# Patient Record
Sex: Female | Born: 1980 | Hispanic: No | Marital: Married | Smoking: Current every day smoker
Health system: Southern US, Community
[De-identification: ages and names within clinical notes are randomized; demographics above are authoritative.]

## PROBLEM LIST (undated history)

## (undated) DIAGNOSIS — T4145XA Adverse effect of unspecified anesthetic, initial encounter: Secondary | ICD-10-CM

## (undated) DIAGNOSIS — T8859XA Other complications of anesthesia, initial encounter: Secondary | ICD-10-CM

## (undated) DIAGNOSIS — K219 Gastro-esophageal reflux disease without esophagitis: Secondary | ICD-10-CM

## (undated) DIAGNOSIS — M329 Systemic lupus erythematosus, unspecified: Secondary | ICD-10-CM

## (undated) DIAGNOSIS — M069 Rheumatoid arthritis, unspecified: Secondary | ICD-10-CM

## (undated) DIAGNOSIS — I471 Supraventricular tachycardia, unspecified: Secondary | ICD-10-CM

## (undated) DIAGNOSIS — R0602 Shortness of breath: Secondary | ICD-10-CM

## (undated) DIAGNOSIS — Z8489 Family history of other specified conditions: Secondary | ICD-10-CM

## (undated) DIAGNOSIS — D649 Anemia, unspecified: Secondary | ICD-10-CM

## (undated) DIAGNOSIS — A749 Chlamydial infection, unspecified: Secondary | ICD-10-CM

## (undated) DIAGNOSIS — G4730 Sleep apnea, unspecified: Secondary | ICD-10-CM

## (undated) DIAGNOSIS — I639 Cerebral infarction, unspecified: Secondary | ICD-10-CM

## (undated) DIAGNOSIS — I10 Essential (primary) hypertension: Secondary | ICD-10-CM

## (undated) DIAGNOSIS — A549 Gonococcal infection, unspecified: Secondary | ICD-10-CM

## (undated) DIAGNOSIS — O139 Gestational [pregnancy-induced] hypertension without significant proteinuria, unspecified trimester: Secondary | ICD-10-CM

## (undated) HISTORY — DX: Morbid (severe) obesity due to excess calories: E66.01

---
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
## 2005-11-16 ENCOUNTER — Inpatient Hospital Stay (HOSPITAL_COMMUNITY): Admission: AD | Admit: 2005-11-16 | Discharge: 2005-11-17 | Payer: Self-pay | Admitting: Obstetrics & Gynecology

## 2005-11-25 ENCOUNTER — Inpatient Hospital Stay (HOSPITAL_COMMUNITY): Admission: AD | Admit: 2005-11-25 | Discharge: 2005-11-25 | Payer: Self-pay | Admitting: Gynecology

## 2005-11-29 ENCOUNTER — Ambulatory Visit: Payer: Self-pay | Admitting: Family Medicine

## 2005-11-29 ENCOUNTER — Encounter: Payer: Self-pay | Admitting: Family Medicine

## 2005-12-20 ENCOUNTER — Ambulatory Visit: Payer: Self-pay | Admitting: Family Medicine

## 2006-01-06 ENCOUNTER — Inpatient Hospital Stay (HOSPITAL_COMMUNITY): Admission: AD | Admit: 2006-01-06 | Discharge: 2006-01-06 | Payer: Self-pay | Admitting: Obstetrics and Gynecology

## 2006-04-23 ENCOUNTER — Inpatient Hospital Stay (HOSPITAL_COMMUNITY): Admission: AD | Admit: 2006-04-23 | Discharge: 2006-04-24 | Payer: Self-pay | Admitting: Obstetrics and Gynecology

## 2006-05-23 ENCOUNTER — Emergency Department (HOSPITAL_COMMUNITY): Admission: EM | Admit: 2006-05-23 | Discharge: 2006-05-23 | Payer: Self-pay | Admitting: Emergency Medicine

## 2006-06-17 ENCOUNTER — Encounter: Admission: RE | Admit: 2006-06-17 | Discharge: 2006-06-17 | Payer: Self-pay | Admitting: Family Medicine

## 2006-07-15 ENCOUNTER — Emergency Department (HOSPITAL_COMMUNITY): Admission: EM | Admit: 2006-07-15 | Discharge: 2006-07-16 | Payer: Self-pay | Admitting: Emergency Medicine

## 2006-10-12 ENCOUNTER — Emergency Department (HOSPITAL_COMMUNITY): Admission: EM | Admit: 2006-10-12 | Discharge: 2006-10-12 | Payer: Self-pay | Admitting: Emergency Medicine

## 2006-10-16 ENCOUNTER — Inpatient Hospital Stay (HOSPITAL_COMMUNITY): Admission: AD | Admit: 2006-10-16 | Discharge: 2006-10-16 | Payer: Self-pay | Admitting: Gynecology

## 2006-12-31 ENCOUNTER — Emergency Department (HOSPITAL_COMMUNITY): Admission: EM | Admit: 2006-12-31 | Discharge: 2007-01-01 | Payer: Self-pay | Admitting: Emergency Medicine

## 2007-01-16 ENCOUNTER — Ambulatory Visit: Payer: Self-pay | Admitting: Gynecology

## 2007-02-20 ENCOUNTER — Inpatient Hospital Stay (HOSPITAL_COMMUNITY): Admission: AD | Admit: 2007-02-20 | Discharge: 2007-02-20 | Payer: Self-pay | Admitting: Gynecology

## 2007-02-22 ENCOUNTER — Inpatient Hospital Stay (HOSPITAL_COMMUNITY): Admission: AD | Admit: 2007-02-22 | Discharge: 2007-02-23 | Payer: Self-pay | Admitting: Gynecology

## 2007-02-26 ENCOUNTER — Inpatient Hospital Stay (HOSPITAL_COMMUNITY): Admission: AD | Admit: 2007-02-26 | Discharge: 2007-02-26 | Payer: Self-pay | Admitting: Family Medicine

## 2007-03-03 ENCOUNTER — Inpatient Hospital Stay (HOSPITAL_COMMUNITY): Admission: RE | Admit: 2007-03-03 | Discharge: 2007-03-03 | Payer: Self-pay | Admitting: Gynecology

## 2007-03-10 ENCOUNTER — Inpatient Hospital Stay (HOSPITAL_COMMUNITY): Admission: AD | Admit: 2007-03-10 | Discharge: 2007-03-10 | Payer: Self-pay | Admitting: Obstetrics and Gynecology

## 2007-04-07 ENCOUNTER — Inpatient Hospital Stay (HOSPITAL_COMMUNITY): Admission: AD | Admit: 2007-04-07 | Discharge: 2007-04-08 | Payer: Self-pay | Admitting: Obstetrics & Gynecology

## 2007-04-07 ENCOUNTER — Inpatient Hospital Stay (HOSPITAL_COMMUNITY): Admission: AD | Admit: 2007-04-07 | Discharge: 2007-04-07 | Payer: Self-pay | Admitting: Obstetrics & Gynecology

## 2007-04-17 ENCOUNTER — Inpatient Hospital Stay (HOSPITAL_COMMUNITY): Admission: AD | Admit: 2007-04-17 | Discharge: 2007-04-17 | Payer: Self-pay | Admitting: Obstetrics & Gynecology

## 2007-04-26 ENCOUNTER — Inpatient Hospital Stay (HOSPITAL_COMMUNITY): Admission: AD | Admit: 2007-04-26 | Discharge: 2007-04-26 | Payer: Self-pay | Admitting: Family Medicine

## 2007-04-30 ENCOUNTER — Ambulatory Visit: Payer: Self-pay | Admitting: *Deleted

## 2007-04-30 ENCOUNTER — Ambulatory Visit (HOSPITAL_COMMUNITY): Admission: RE | Admit: 2007-04-30 | Discharge: 2007-04-30 | Payer: Self-pay | Admitting: *Deleted

## 2007-05-01 ENCOUNTER — Ambulatory Visit: Payer: Self-pay | Admitting: Obstetrics and Gynecology

## 2007-05-16 ENCOUNTER — Encounter: Payer: Self-pay | Admitting: *Deleted

## 2007-05-16 ENCOUNTER — Inpatient Hospital Stay (HOSPITAL_COMMUNITY): Admission: AD | Admit: 2007-05-16 | Discharge: 2007-05-16 | Payer: Self-pay | Admitting: Obstetrics and Gynecology

## 2007-06-02 ENCOUNTER — Encounter: Payer: Self-pay | Admitting: Obstetrics & Gynecology

## 2007-06-02 ENCOUNTER — Ambulatory Visit: Payer: Self-pay | Admitting: Obstetrics & Gynecology

## 2007-06-02 ENCOUNTER — Encounter: Admission: RE | Admit: 2007-06-02 | Discharge: 2007-06-02 | Payer: Self-pay | Admitting: Obstetrics & Gynecology

## 2007-06-02 ENCOUNTER — Inpatient Hospital Stay (HOSPITAL_COMMUNITY): Admission: AD | Admit: 2007-06-02 | Discharge: 2007-06-02 | Payer: Self-pay | Admitting: Obstetrics & Gynecology

## 2007-06-04 ENCOUNTER — Encounter: Payer: Self-pay | Admitting: *Deleted

## 2007-06-04 ENCOUNTER — Inpatient Hospital Stay (HOSPITAL_COMMUNITY): Admission: AD | Admit: 2007-06-04 | Discharge: 2007-06-04 | Payer: Self-pay | Admitting: Obstetrics & Gynecology

## 2007-06-20 ENCOUNTER — Inpatient Hospital Stay (HOSPITAL_COMMUNITY): Admission: AD | Admit: 2007-06-20 | Discharge: 2007-06-21 | Payer: Self-pay | Admitting: Obstetrics & Gynecology

## 2007-06-20 ENCOUNTER — Ambulatory Visit: Payer: Self-pay | Admitting: Obstetrics and Gynecology

## 2007-06-23 ENCOUNTER — Ambulatory Visit: Payer: Self-pay | Admitting: Obstetrics & Gynecology

## 2007-06-30 ENCOUNTER — Ambulatory Visit: Payer: Self-pay | Admitting: Obstetrics & Gynecology

## 2007-07-01 ENCOUNTER — Encounter: Admission: RE | Admit: 2007-07-01 | Discharge: 2007-07-01 | Payer: Self-pay | Admitting: Obstetrics & Gynecology

## 2007-07-02 ENCOUNTER — Encounter: Payer: Self-pay | Admitting: *Deleted

## 2007-07-02 ENCOUNTER — Ambulatory Visit: Payer: Self-pay | Admitting: *Deleted

## 2007-07-02 ENCOUNTER — Inpatient Hospital Stay (HOSPITAL_COMMUNITY): Admission: AD | Admit: 2007-07-02 | Discharge: 2007-07-04 | Payer: Self-pay | Admitting: Gynecology

## 2007-07-07 ENCOUNTER — Ambulatory Visit: Payer: Self-pay | Admitting: Obstetrics & Gynecology

## 2007-07-21 ENCOUNTER — Ambulatory Visit: Payer: Self-pay | Admitting: Physician Assistant

## 2007-07-21 ENCOUNTER — Inpatient Hospital Stay (HOSPITAL_COMMUNITY): Admission: AD | Admit: 2007-07-21 | Discharge: 2007-07-22 | Payer: Self-pay | Admitting: Obstetrics & Gynecology

## 2007-07-29 ENCOUNTER — Encounter: Payer: Self-pay | Admitting: *Deleted

## 2007-07-29 ENCOUNTER — Ambulatory Visit: Payer: Self-pay | Admitting: *Deleted

## 2007-07-29 ENCOUNTER — Inpatient Hospital Stay (HOSPITAL_COMMUNITY): Admission: AD | Admit: 2007-07-29 | Discharge: 2007-07-29 | Payer: Self-pay | Admitting: Obstetrics & Gynecology

## 2007-07-30 ENCOUNTER — Inpatient Hospital Stay (HOSPITAL_COMMUNITY): Admission: AD | Admit: 2007-07-30 | Discharge: 2007-07-30 | Payer: Self-pay | Admitting: Obstetrics & Gynecology

## 2007-08-01 ENCOUNTER — Inpatient Hospital Stay (HOSPITAL_COMMUNITY): Admission: AD | Admit: 2007-08-01 | Discharge: 2007-08-01 | Payer: Self-pay | Admitting: Obstetrics & Gynecology

## 2007-08-10 ENCOUNTER — Inpatient Hospital Stay (HOSPITAL_COMMUNITY): Admission: AD | Admit: 2007-08-10 | Discharge: 2007-08-10 | Payer: Self-pay | Admitting: Obstetrics and Gynecology

## 2007-08-14 ENCOUNTER — Ambulatory Visit (HOSPITAL_COMMUNITY): Admission: RE | Admit: 2007-08-14 | Discharge: 2007-08-14 | Payer: Self-pay | Admitting: Obstetrics & Gynecology

## 2007-08-14 ENCOUNTER — Ambulatory Visit: Payer: Self-pay | Admitting: Obstetrics & Gynecology

## 2007-08-15 ENCOUNTER — Ambulatory Visit: Payer: Self-pay | Admitting: Gynecology

## 2007-08-18 ENCOUNTER — Ambulatory Visit: Payer: Self-pay | Admitting: Obstetrics & Gynecology

## 2007-08-25 ENCOUNTER — Ambulatory Visit: Payer: Self-pay | Admitting: Obstetrics & Gynecology

## 2007-08-27 ENCOUNTER — Ambulatory Visit: Payer: Self-pay | Admitting: Obstetrics and Gynecology

## 2007-08-28 HISTORY — PX: TUBAL LIGATION: SHX77

## 2007-08-30 ENCOUNTER — Inpatient Hospital Stay (HOSPITAL_COMMUNITY): Admission: AD | Admit: 2007-08-30 | Discharge: 2007-08-31 | Payer: Self-pay | Admitting: Gynecology

## 2007-08-30 ENCOUNTER — Ambulatory Visit: Payer: Self-pay | Admitting: *Deleted

## 2007-09-01 ENCOUNTER — Ambulatory Visit: Payer: Self-pay | Admitting: Obstetrics & Gynecology

## 2007-09-06 ENCOUNTER — Inpatient Hospital Stay (HOSPITAL_COMMUNITY): Admission: AD | Admit: 2007-09-06 | Discharge: 2007-09-06 | Payer: Self-pay | Admitting: Obstetrics & Gynecology

## 2007-09-08 ENCOUNTER — Ambulatory Visit: Payer: Self-pay | Admitting: Family Medicine

## 2007-09-11 ENCOUNTER — Inpatient Hospital Stay (HOSPITAL_COMMUNITY): Admission: AD | Admit: 2007-09-11 | Discharge: 2007-09-13 | Payer: Self-pay | Admitting: Obstetrics & Gynecology

## 2007-09-11 ENCOUNTER — Encounter: Payer: Self-pay | Admitting: *Deleted

## 2007-09-11 ENCOUNTER — Ambulatory Visit: Payer: Self-pay | Admitting: *Deleted

## 2007-09-11 ENCOUNTER — Ambulatory Visit: Payer: Self-pay | Admitting: Obstetrics & Gynecology

## 2007-09-15 ENCOUNTER — Ambulatory Visit: Payer: Self-pay | Admitting: Family Medicine

## 2007-09-19 ENCOUNTER — Ambulatory Visit (HOSPITAL_COMMUNITY): Admission: RE | Admit: 2007-09-19 | Discharge: 2007-09-19 | Payer: Self-pay | Admitting: Family Medicine

## 2007-09-20 ENCOUNTER — Inpatient Hospital Stay (HOSPITAL_COMMUNITY): Admission: AD | Admit: 2007-09-20 | Discharge: 2007-09-20 | Payer: Self-pay | Admitting: Obstetrics and Gynecology

## 2007-09-22 ENCOUNTER — Ambulatory Visit: Payer: Self-pay | Admitting: Family Medicine

## 2007-09-22 ENCOUNTER — Ambulatory Visit (HOSPITAL_COMMUNITY): Admission: RE | Admit: 2007-09-22 | Discharge: 2007-09-22 | Payer: Self-pay | Admitting: Obstetrics & Gynecology

## 2007-09-25 ENCOUNTER — Inpatient Hospital Stay (HOSPITAL_COMMUNITY): Admission: AD | Admit: 2007-09-25 | Discharge: 2007-09-26 | Payer: Self-pay | Admitting: Obstetrics & Gynecology

## 2007-09-25 ENCOUNTER — Ambulatory Visit: Payer: Self-pay | Admitting: *Deleted

## 2007-09-26 ENCOUNTER — Ambulatory Visit: Payer: Self-pay | Admitting: Gynecology

## 2007-09-26 ENCOUNTER — Inpatient Hospital Stay (HOSPITAL_COMMUNITY): Admission: AD | Admit: 2007-09-26 | Discharge: 2007-09-28 | Payer: Self-pay | Admitting: Gynecology

## 2007-09-29 ENCOUNTER — Ambulatory Visit: Payer: Self-pay | Admitting: Obstetrics and Gynecology

## 2007-09-29 ENCOUNTER — Ambulatory Visit: Payer: Self-pay | Admitting: Obstetrics & Gynecology

## 2007-09-29 ENCOUNTER — Inpatient Hospital Stay (HOSPITAL_COMMUNITY): Admission: AD | Admit: 2007-09-29 | Discharge: 2007-09-29 | Payer: Self-pay | Admitting: Obstetrics & Gynecology

## 2007-10-03 ENCOUNTER — Encounter: Payer: Self-pay | Admitting: *Deleted

## 2007-10-03 ENCOUNTER — Ambulatory Visit: Payer: Self-pay | Admitting: Physician Assistant

## 2007-10-03 ENCOUNTER — Observation Stay (HOSPITAL_COMMUNITY): Admission: AD | Admit: 2007-10-03 | Discharge: 2007-10-03 | Payer: Self-pay | Admitting: Obstetrics & Gynecology

## 2007-10-04 ENCOUNTER — Inpatient Hospital Stay (HOSPITAL_COMMUNITY): Admission: AD | Admit: 2007-10-04 | Discharge: 2007-10-07 | Payer: Self-pay | Admitting: Obstetrics & Gynecology

## 2007-10-04 ENCOUNTER — Ambulatory Visit: Payer: Self-pay | Admitting: Family

## 2007-10-05 ENCOUNTER — Encounter: Payer: Self-pay | Admitting: Obstetrics & Gynecology

## 2007-10-12 ENCOUNTER — Emergency Department (HOSPITAL_COMMUNITY): Admission: EM | Admit: 2007-10-12 | Discharge: 2007-10-12 | Payer: Self-pay | Admitting: Emergency Medicine

## 2007-11-20 ENCOUNTER — Ambulatory Visit: Payer: Self-pay | Admitting: Obstetrics & Gynecology

## 2008-01-31 ENCOUNTER — Inpatient Hospital Stay (HOSPITAL_COMMUNITY): Admission: AD | Admit: 2008-01-31 | Discharge: 2008-01-31 | Payer: Self-pay | Admitting: Obstetrics and Gynecology

## 2008-05-16 ENCOUNTER — Emergency Department (HOSPITAL_COMMUNITY): Admission: EM | Admit: 2008-05-16 | Discharge: 2008-05-16 | Payer: Self-pay | Admitting: Emergency Medicine

## 2008-08-27 DIAGNOSIS — A549 Gonococcal infection, unspecified: Secondary | ICD-10-CM

## 2008-08-27 HISTORY — DX: Gonococcal infection, unspecified: A54.9

## 2008-11-30 ENCOUNTER — Inpatient Hospital Stay (HOSPITAL_COMMUNITY): Admission: AD | Admit: 2008-11-30 | Discharge: 2008-11-30 | Payer: Self-pay | Admitting: Obstetrics & Gynecology

## 2008-11-30 ENCOUNTER — Ambulatory Visit: Payer: Self-pay | Admitting: Obstetrics and Gynecology

## 2009-02-19 ENCOUNTER — Emergency Department (HOSPITAL_COMMUNITY): Admission: EM | Admit: 2009-02-19 | Discharge: 2009-02-20 | Payer: Self-pay | Admitting: Emergency Medicine

## 2009-03-10 ENCOUNTER — Emergency Department (HOSPITAL_COMMUNITY): Admission: EM | Admit: 2009-03-10 | Discharge: 2009-03-10 | Payer: Self-pay | Admitting: Emergency Medicine

## 2009-03-21 ENCOUNTER — Inpatient Hospital Stay (HOSPITAL_COMMUNITY): Admission: AD | Admit: 2009-03-21 | Discharge: 2009-03-21 | Payer: Self-pay | Admitting: Obstetrics & Gynecology

## 2009-04-17 ENCOUNTER — Emergency Department (HOSPITAL_COMMUNITY): Admission: EM | Admit: 2009-04-17 | Discharge: 2009-04-17 | Payer: Self-pay | Admitting: Emergency Medicine

## 2009-05-15 ENCOUNTER — Emergency Department (HOSPITAL_COMMUNITY): Admission: EM | Admit: 2009-05-15 | Discharge: 2009-05-16 | Payer: Self-pay | Admitting: Emergency Medicine

## 2009-08-04 ENCOUNTER — Emergency Department (HOSPITAL_COMMUNITY): Admission: EM | Admit: 2009-08-04 | Discharge: 2009-08-05 | Payer: Self-pay | Admitting: Emergency Medicine

## 2009-11-06 IMAGING — US US FETAL BPP W/O NONSTRESS
1 series · 14 of 28 positions shown · non-contrast
Comparison: none

OBSTETRICAL ULTRASOUND:
 This ultrasound was performed in The [HOSPITAL], and the [facility] report will be stored to [REDACTED] PACS.

[Series 1: us fetal bpp w/o nonstress · 14 of 31 slices shown]
[im 2/31]
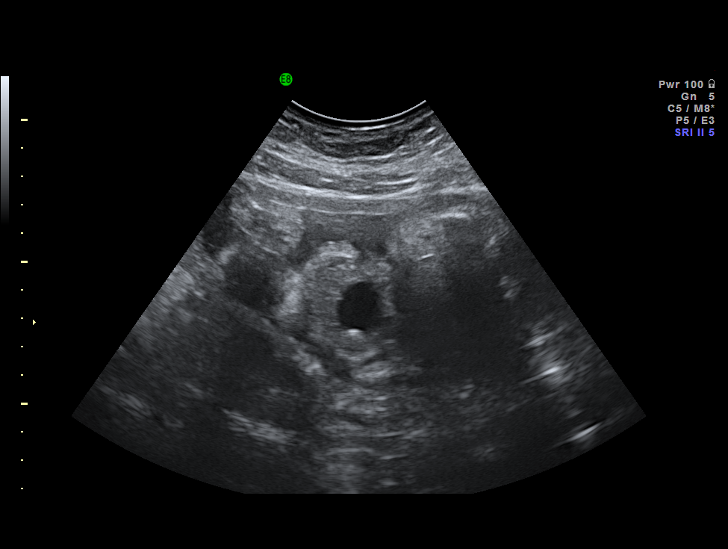
[im 4/31]
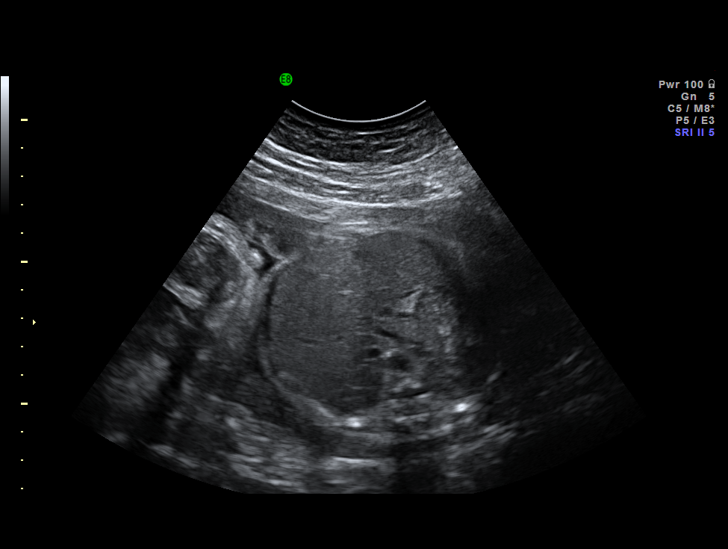
[im 6/31]
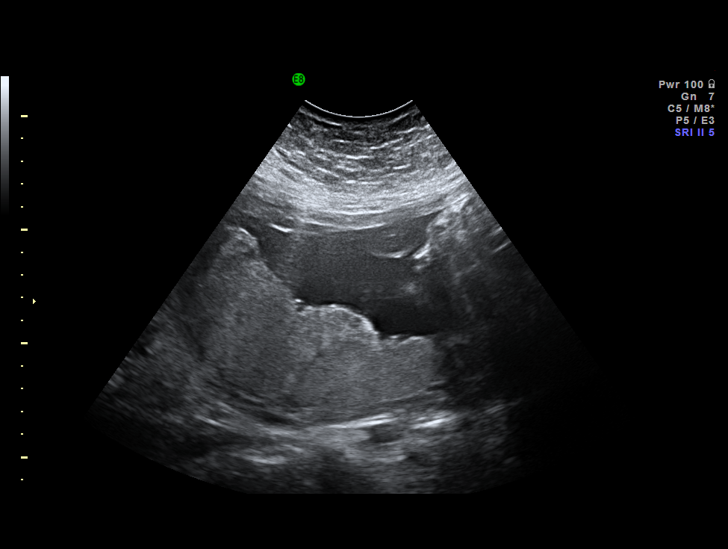
[im 8/31]
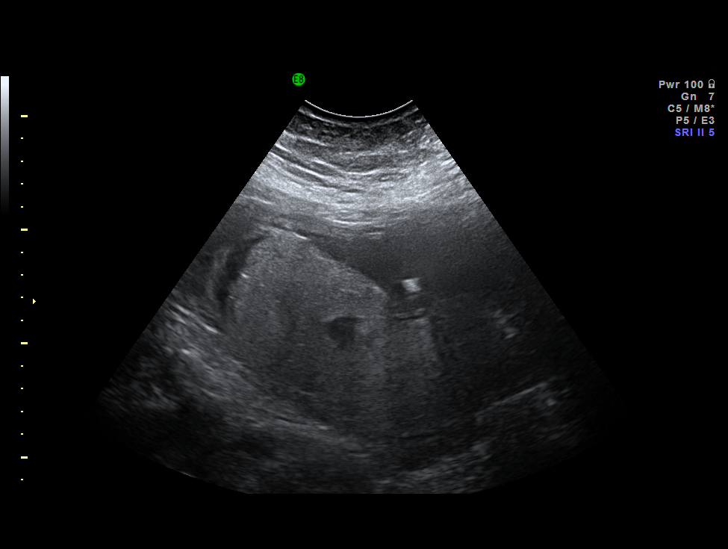
[im 11/31]
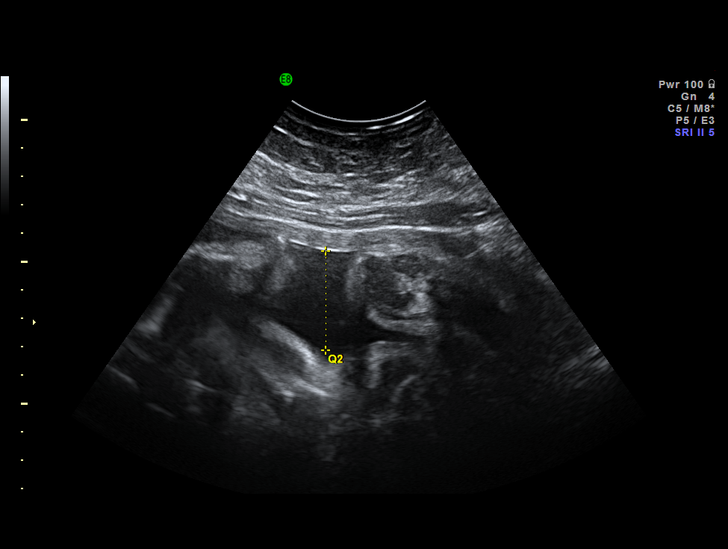
[im 13/31]
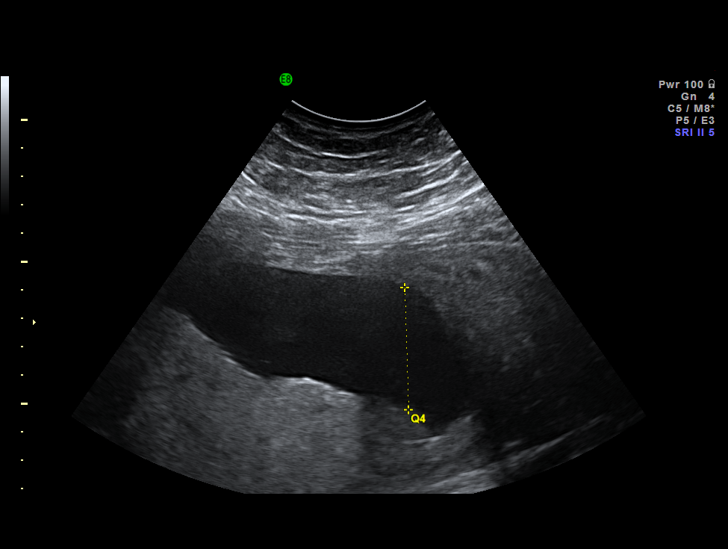
[im 15/31]
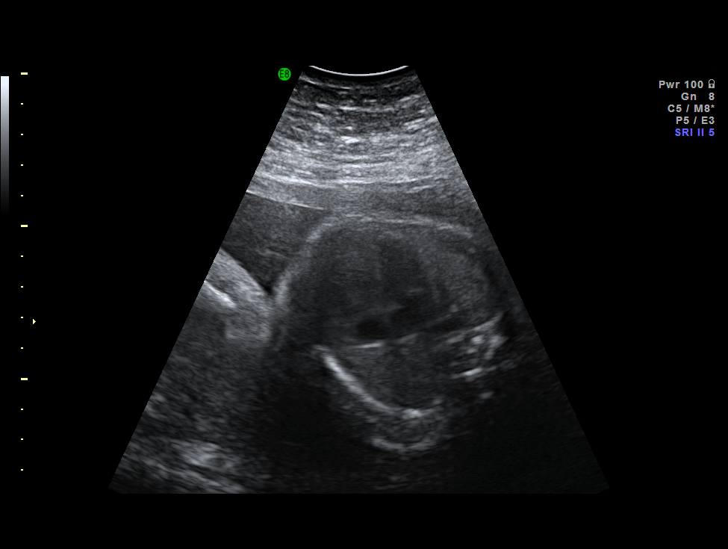
[im 17/31]
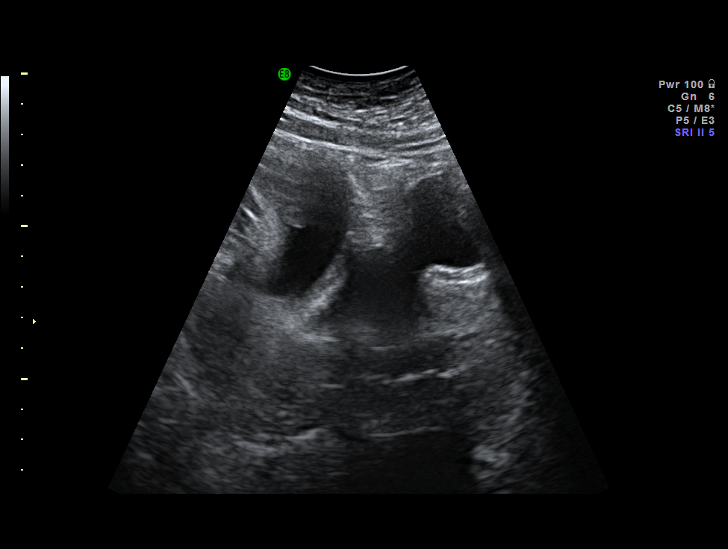
[im 19/31]
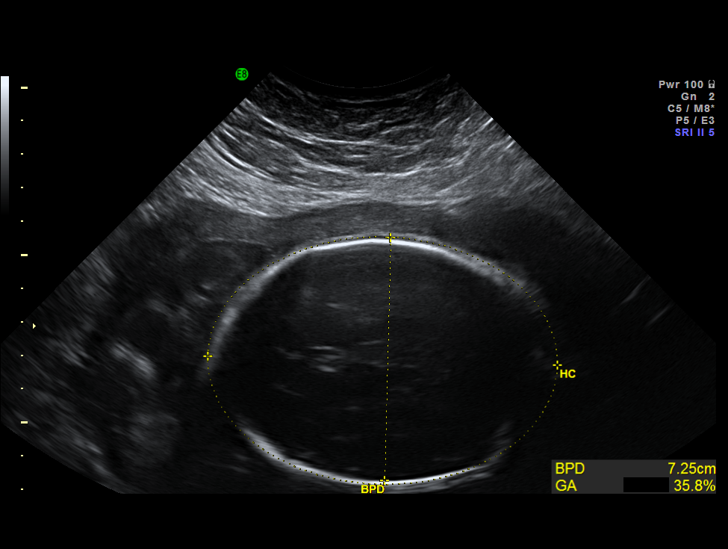
[im 22/31]
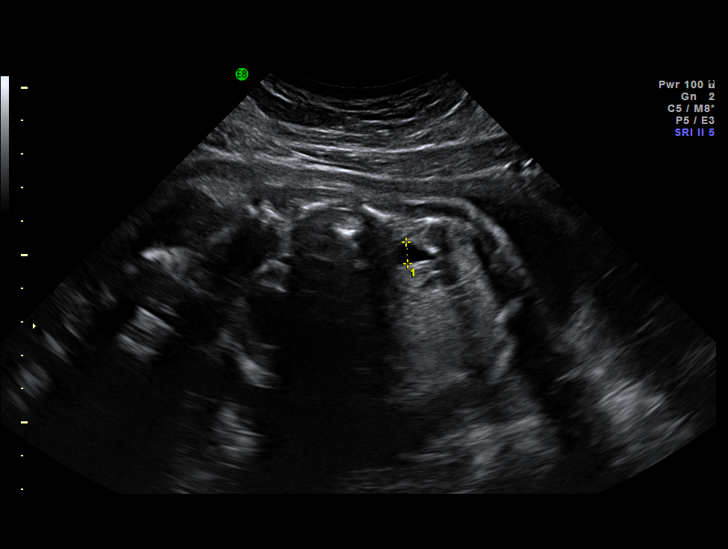
[im 24/31]
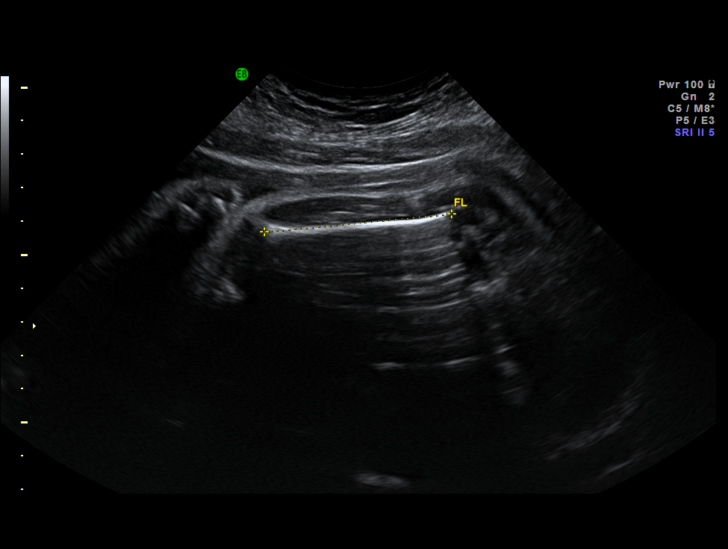
[im 26/31]
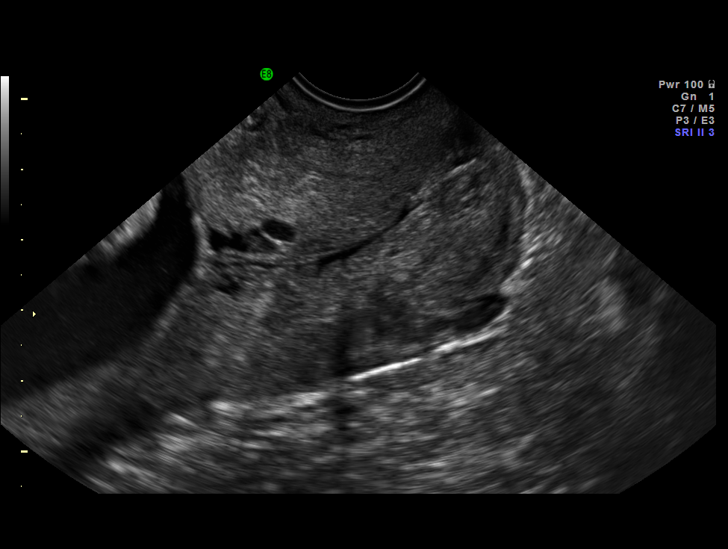
[im 28/31]
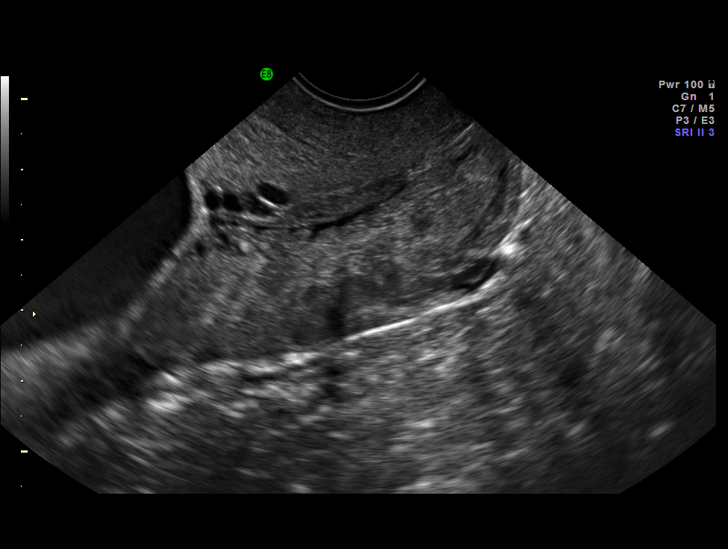
[im 31/31]
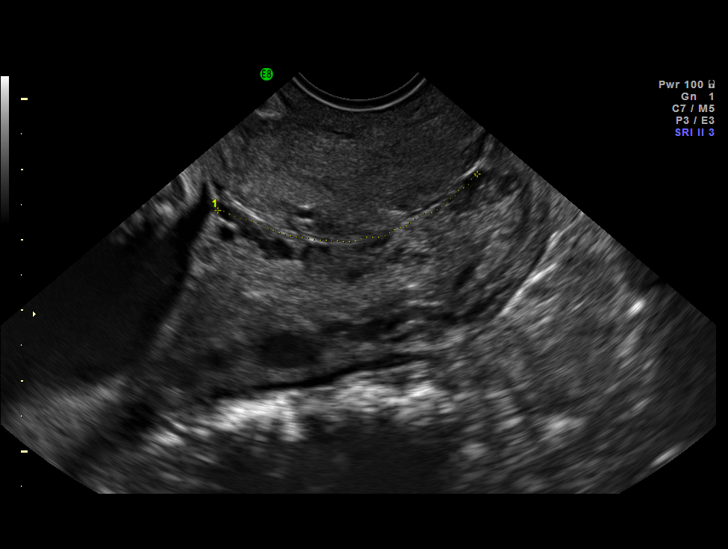

[14 of 28 positions shown; findings below may reference images not displayed]

IMPRESSION: The [facility] report has also been faxed to the ordering physician.

## 2010-01-31 ENCOUNTER — Emergency Department (HOSPITAL_COMMUNITY): Admission: EM | Admit: 2010-01-31 | Discharge: 2010-01-31 | Payer: Self-pay | Admitting: Family Medicine

## 2010-02-10 ENCOUNTER — Emergency Department (HOSPITAL_COMMUNITY): Admission: EM | Admit: 2010-02-10 | Discharge: 2010-02-10 | Payer: Self-pay | Admitting: Emergency Medicine

## 2010-02-25 ENCOUNTER — Ambulatory Visit: Payer: Self-pay | Admitting: Nurse Practitioner

## 2010-02-25 ENCOUNTER — Inpatient Hospital Stay (HOSPITAL_COMMUNITY): Admission: AD | Admit: 2010-02-25 | Discharge: 2010-02-26 | Payer: Self-pay | Admitting: Obstetrics & Gynecology

## 2010-06-30 ENCOUNTER — Inpatient Hospital Stay (HOSPITAL_COMMUNITY): Admission: AD | Admit: 2010-06-30 | Discharge: 2010-06-30 | Payer: Self-pay | Admitting: Family Medicine

## 2010-07-10 ENCOUNTER — Emergency Department (HOSPITAL_COMMUNITY): Admission: EM | Admit: 2010-07-10 | Discharge: 2010-07-11 | Payer: Self-pay | Admitting: Emergency Medicine

## 2010-08-27 DIAGNOSIS — A749 Chlamydial infection, unspecified: Secondary | ICD-10-CM

## 2010-08-27 DIAGNOSIS — I639 Cerebral infarction, unspecified: Secondary | ICD-10-CM | POA: Insufficient documentation

## 2010-08-27 DIAGNOSIS — Z8673 Personal history of transient ischemic attack (TIA), and cerebral infarction without residual deficits: Secondary | ICD-10-CM | POA: Insufficient documentation

## 2010-08-27 HISTORY — DX: Cerebral infarction, unspecified: I63.9

## 2010-08-27 HISTORY — DX: Chlamydial infection, unspecified: A74.9

## 2010-09-17 ENCOUNTER — Encounter: Payer: Self-pay | Admitting: Gynecology

## 2010-09-17 ENCOUNTER — Encounter: Payer: Self-pay | Admitting: *Deleted

## 2010-10-30 ENCOUNTER — Inpatient Hospital Stay (HOSPITAL_COMMUNITY)
Admission: EM | Admit: 2010-10-30 | Discharge: 2010-11-01 | DRG: 069 | Disposition: A | Discharge status: Home Health | Payer: Self-pay | Attending: Internal Medicine | Admitting: Internal Medicine

## 2010-10-30 ENCOUNTER — Emergency Department (HOSPITAL_COMMUNITY): Payer: Self-pay

## 2010-10-30 ENCOUNTER — Ambulatory Visit: Payer: Self-pay | Admitting: Family Medicine

## 2010-10-30 DIAGNOSIS — E282 Polycystic ovarian syndrome: Secondary | ICD-10-CM | POA: Diagnosis present

## 2010-10-30 DIAGNOSIS — Z86718 Personal history of other venous thrombosis and embolism: Secondary | ICD-10-CM

## 2010-10-30 DIAGNOSIS — J45909 Unspecified asthma, uncomplicated: Secondary | ICD-10-CM | POA: Diagnosis present

## 2010-10-30 DIAGNOSIS — Z7982 Long term (current) use of aspirin: Secondary | ICD-10-CM

## 2010-10-30 DIAGNOSIS — Z833 Family history of diabetes mellitus: Secondary | ICD-10-CM

## 2010-10-30 DIAGNOSIS — K219 Gastro-esophageal reflux disease without esophagitis: Secondary | ICD-10-CM | POA: Diagnosis present

## 2010-10-30 DIAGNOSIS — I10 Essential (primary) hypertension: Secondary | ICD-10-CM | POA: Diagnosis present

## 2010-10-30 DIAGNOSIS — R0789 Other chest pain: Secondary | ICD-10-CM | POA: Diagnosis present

## 2010-10-30 DIAGNOSIS — G43809 Other migraine, not intractable, without status migrainosus: Secondary | ICD-10-CM | POA: Diagnosis present

## 2010-10-30 DIAGNOSIS — Z8249 Family history of ischemic heart disease and other diseases of the circulatory system: Secondary | ICD-10-CM

## 2010-10-30 DIAGNOSIS — K8020 Calculus of gallbladder without cholecystitis without obstruction: Secondary | ICD-10-CM | POA: Diagnosis present

## 2010-10-30 DIAGNOSIS — G459 Transient cerebral ischemic attack, unspecified: Principal | ICD-10-CM | POA: Diagnosis present

## 2010-10-30 DIAGNOSIS — R7309 Other abnormal glucose: Secondary | ICD-10-CM | POA: Diagnosis present

## 2010-10-30 LAB — POCT I-STAT, CHEM 8
BUN: 17 mg/dL (ref 6–23)
Calcium, Ion: 1.14 mmol/L (ref 1.12–1.32)
Chloride: 101 mEq/L (ref 96–112)
Creatinine, Ser: 0.9 mg/dL (ref 0.4–1.2)
Glucose, Bld: 97 mg/dL (ref 70–99)
HCT: 43 % (ref 36.0–46.0)
Hemoglobin: 14.6 g/dL (ref 12.0–15.0)
Potassium: 4.1 mEq/L (ref 3.5–5.1)
Sodium: 137 mEq/L (ref 135–145)
TCO2: 26 mmol/L (ref 0–100)

## 2010-10-30 LAB — URINE MICROSCOPIC-ADD ON

## 2010-10-30 LAB — CBC
HCT: 39.7 % (ref 36.0–46.0)
Hemoglobin: 13.3 g/dL (ref 12.0–15.0)
MCH: 28.6 pg (ref 26.0–34.0)
MCHC: 33.5 g/dL (ref 30.0–36.0)
MCV: 85.4 fL (ref 78.0–100.0)
Platelets: 244 10*3/uL (ref 150–400)
RBC: 4.65 MIL/uL (ref 3.87–5.11)
RDW: 13.7 % (ref 11.5–15.5)
WBC: 10.2 10*3/uL (ref 4.0–10.5)

## 2010-10-30 LAB — URINALYSIS, ROUTINE W REFLEX MICROSCOPIC
Bilirubin Urine: NEGATIVE
Glucose, UA: NEGATIVE mg/dL
Hgb urine dipstick: NEGATIVE
Ketones, ur: NEGATIVE mg/dL
Leukocytes, UA: NEGATIVE
Nitrite: NEGATIVE
Protein, ur: NEGATIVE mg/dL
Specific Gravity, Urine: 1.027 (ref 1.005–1.030)
Urobilinogen, UA: 1 mg/dL (ref 0.0–1.0)
pH: 7 (ref 5.0–8.0)

## 2010-10-30 LAB — DIFFERENTIAL
Basophils Absolute: 0 10*3/uL (ref 0.0–0.1)
Basophils Relative: 0 % (ref 0–1)
Eosinophils Absolute: 0.2 10*3/uL (ref 0.0–0.7)
Eosinophils Relative: 2 % (ref 0–5)
Lymphocytes Relative: 37 % (ref 12–46)
Lymphs Abs: 3.8 10*3/uL (ref 0.7–4.0)
Monocytes Absolute: 0.8 10*3/uL (ref 0.1–1.0)
Monocytes Relative: 8 % (ref 3–12)
Neutro Abs: 5.5 10*3/uL (ref 1.7–7.7)
Neutrophils Relative %: 54 % (ref 43–77)

## 2010-10-31 ENCOUNTER — Inpatient Hospital Stay (HOSPITAL_COMMUNITY): Payer: Self-pay

## 2010-10-31 DIAGNOSIS — G459 Transient cerebral ischemic attack, unspecified: Secondary | ICD-10-CM

## 2010-10-31 LAB — COMPREHENSIVE METABOLIC PANEL
ALT: 13 U/L (ref 0–35)
AST: 15 U/L (ref 0–37)
Albumin: 3.5 g/dL (ref 3.5–5.2)
Alkaline Phosphatase: 69 U/L (ref 39–117)
BUN: 11 mg/dL (ref 6–23)
CO2: 28 mEq/L (ref 19–32)
Calcium: 9 mg/dL (ref 8.4–10.5)
Chloride: 103 mEq/L (ref 96–112)
Creatinine, Ser: 0.67 mg/dL (ref 0.4–1.2)
GFR calc Af Amer: >60 mL/min (ref >60)
GFR calc non Af Amer: >60 mL/min (ref >60)
Glucose, Bld: 106 mg/dL — ABNORMAL HIGH (ref 70–99)
Potassium: 3.5 mEq/L (ref 3.5–5.1)
Sodium: 137 mEq/L (ref 135–145)
Total Bilirubin: 0.5 mg/dL (ref 0.3–1.2)
Total Protein: 7.6 g/dL (ref 6.0–8.3)

## 2010-10-31 LAB — CBC
HCT: 37.2 % (ref 36.0–46.0)
Hemoglobin: 12.3 g/dL (ref 12.0–15.0)
MCH: 27.9 pg (ref 26.0–34.0)
MCHC: 33.1 g/dL (ref 30.0–36.0)
MCV: 84.4 fL (ref 78.0–100.0)
Platelets: 209 10*3/uL (ref 150–400)
RBC: 4.41 MIL/uL (ref 3.87–5.11)
RDW: 13.7 % (ref 11.5–15.5)
WBC: 8.4 10*3/uL (ref 4.0–10.5)

## 2010-10-31 LAB — LIPID PANEL
Cholesterol: 164 mg/dL (ref 0–200)
HDL: 33 mg/dL — ABNORMAL LOW (ref >39)
LDL Cholesterol: 120 mg/dL — ABNORMAL HIGH (ref 0–99)
Total CHOL/HDL Ratio: 5 RATIO
Triglycerides: 56 mg/dL (ref <150)
VLDL: 11 mg/dL (ref 0–40)

## 2010-10-31 LAB — CARDIAC PANEL(CRET KIN+CKTOT+MB+TROPI)
CK, MB: 1.2 ng/mL (ref 0.3–4.0)
CK, MB: 1 ng/mL (ref 0.3–4.0)
CK, MB: 0.9 ng/mL (ref 0.3–4.0)
Relative Index: INVALID (ref 0.0–2.5)
Relative Index: INVALID (ref 0.0–2.5)
Relative Index: INVALID (ref 0.0–2.5)
Total CK: 81 U/L (ref 7–177)
Total CK: 72 U/L (ref 7–177)
Total CK: 79 U/L (ref 7–177)
Troponin I: <0.01 ng/mL (ref 0.00–0.06)
Troponin I: 0.01 ng/mL (ref 0.00–0.06)
Troponin I: 0.02 ng/mL (ref 0.00–0.06)

## 2010-10-31 LAB — RAPID URINE DRUG SCREEN, HOSP PERFORMED
Amphetamines: NOT DETECTED
Barbiturates: NOT DETECTED
Benzodiazepines: NOT DETECTED
Cocaine: NOT DETECTED
Opiates: NOT DETECTED
Tetrahydrocannabinol: POSITIVE — AB

## 2010-10-31 LAB — HEMOGLOBIN A1C
Hgb A1c MFr Bld: 6 % — ABNORMAL HIGH (ref <5.7)
Mean Plasma Glucose: 126 mg/dL — ABNORMAL HIGH (ref <117)

## 2010-10-31 LAB — GLUCOSE, CAPILLARY: Glucose-Capillary: 114 mg/dL — ABNORMAL HIGH (ref 70–99)

## 2010-10-31 LAB — HOMOCYSTEINE: Homocysteine: 7.6 umol/L (ref 4.0–15.4)

## 2010-10-31 LAB — APTT: aPTT: 34 seconds (ref 24–37)

## 2010-10-31 LAB — ANTITHROMBIN III: AntiThromb III Func: 86 % (ref 75–120)

## 2010-10-31 LAB — PROTIME-INR
INR: 1.07 (ref 0.00–1.49)
Prothrombin Time: 14.1 seconds (ref 11.6–15.2)

## 2010-11-01 ENCOUNTER — Inpatient Hospital Stay (HOSPITAL_COMMUNITY): Payer: Self-pay

## 2010-11-01 LAB — PROTEIN C ACTIVITY: Protein C Activity: 140 % — ABNORMAL HIGH (ref 75–133)

## 2010-11-01 LAB — BASIC METABOLIC PANEL
BUN: 11 mg/dL (ref 6–23)
CO2: 27 mEq/L (ref 19–32)
Calcium: 8.6 mg/dL (ref 8.4–10.5)
Chloride: 99 mEq/L (ref 96–112)
Creatinine, Ser: 0.75 mg/dL (ref 0.4–1.2)
GFR calc Af Amer: >60 mL/min (ref >60)
GFR calc non Af Amer: >60 mL/min (ref >60)
Glucose, Bld: 132 mg/dL — ABNORMAL HIGH (ref 70–99)
Potassium: 4 mEq/L (ref 3.5–5.1)
Sodium: 136 mEq/L (ref 135–145)

## 2010-11-01 LAB — CBC
HCT: 35.9 % — ABNORMAL LOW (ref 36.0–46.0)
Hemoglobin: 11.8 g/dL — ABNORMAL LOW (ref 12.0–15.0)
MCH: 28.2 pg (ref 26.0–34.0)
MCHC: 32.9 g/dL (ref 30.0–36.0)
MCV: 85.9 fL (ref 78.0–100.0)
Platelets: 205 10*3/uL (ref 150–400)
RBC: 4.18 MIL/uL (ref 3.87–5.11)
RDW: 13.7 % (ref 11.5–15.5)
WBC: 6.4 10*3/uL (ref 4.0–10.5)

## 2010-11-01 LAB — LUPUS ANTICOAGULANT PANEL
DRVVT: 47.7 secs — ABNORMAL HIGH (ref 36.2–44.3)
Lupus Anticoagulant: NOT DETECTED
PTT Lupus Anticoagulant: 48.6 secs — ABNORMAL HIGH (ref 30.0–45.6)
PTTLA 4:1 Mix: 47.9 secs — ABNORMAL HIGH (ref 30.0–45.6)
PTTLA Confirmation: 0.1 secs (ref <8.0)
dRVVT Incubated 1:1 Mix: 42.1 secs (ref 36.2–44.3)

## 2010-11-01 LAB — CARDIAC PANEL(CRET KIN+CKTOT+MB+TROPI)
CK, MB: 0.8 ng/mL (ref 0.3–4.0)
Relative Index: INVALID (ref 0.0–2.5)
Total CK: 84 U/L (ref 7–177)
Troponin I: <0.01 ng/mL (ref 0.00–0.06)

## 2010-11-01 LAB — GLUCOSE, CAPILLARY
Glucose-Capillary: 114 mg/dL — ABNORMAL HIGH (ref 70–99)
Glucose-Capillary: 85 mg/dL (ref 70–99)

## 2010-11-01 LAB — PROTEIN S ACTIVITY: Protein S Activity: 66 % — ABNORMAL LOW (ref 69–129)

## 2010-11-01 LAB — D-DIMER, QUANTITATIVE: D-Dimer, Quant: 0.3 ug/mL-FEU (ref 0.00–0.48)

## 2010-11-01 LAB — TSH: TSH: 0.838 u[IU]/mL (ref 0.350–4.500)

## 2010-11-02 LAB — FACTOR 5 LEIDEN

## 2010-11-05 NOTE — Discharge Summary (Signed)
Maria Carpenter, BACKER NO.:  [id number]  MEDICAL RECORD NO.:  [id number]           PATIENT TYPE:  I  LOCATION:  3732                         FACILITY:  [facility]  PHYSICIAN:  Lonia Blood, M.D.       DATE OF BIRTH:  Mar 25, 1981  DATE OF ADMISSION:  10/30/2010 DATE OF DISCHARGE:  11/01/2010                              DISCHARGE SUMMARY   PRIMARY CARE PHYSICIAN:  Will become [facility].  DISCHARGE DIAGNOSES: 1. Probable transient ischemic attack versus migraine with     neurological deficits - neurological deficits have resolved by the     time of discharge. 2. Morbid obesity. 3. Hypertension. 4. Impaired glucose tolerance. 5. Probable polycystic ovarian syndrome. 6. History of asthma. 7. Gastroesophageal reflux disease. 8. Asymptomatic cholelithiasis.  DISCHARGE MEDICATIONS: 1. Propranolol 10 mg twice a day. 2. Aspirin 81 mg daily. 3. Metformin 500 mg daily. 4. Nicotine patch 21 mg daily. 5. Simvastatin 10 mg daily.  CONDITION ON DISCHARGE:  Maria Carpenter was discharged home in good condition.  She was set up with [facility] November 24, 2010.  She was neurologically intact during my exam and the neurologist's exam.  She felt overall though weak, but again during the objective exam, she had no noticeable deficits.  PROCEDURE IN THIS ADMISSION: 1. On October 30, 2010, the patient underwent a head CT without contrast,     which showed no acute intracranial findings, normal exam.  No     change since prior exam. 2. November 01, 2010, repeat head CT, which was without any interval     changes. 3. Carotid Dopplers October 31, 2010, which was negative for extracranial     carotid artery stenosis.  CONSULTATION IN ADMISSION:  The patient was discussed and examined together with consultant neurologist, Dr. Hoy Morn.  HISTORY AND PHYSICAL:  Refer to dictated H and P done by Dr. Houston Siren October 31, 2010.  HOSPITAL COURSE: 1. Transient  neurological deficits.  Maria Carpenter was admitted on     observation on telemetry, frequent neurological examinations.     Differential diagnosis was between TIA or a migraine with     neurological deficits.  The patient had an initial head CT which     was later repeated without any differences between the two of them     noted.  The patient could not have an MRI done due to the fact that     she was exceeding the weight limit of the MRI machine.  Full     examination done by myself and Dr. Hoy Morn on November 01, 2010, did not     reveal any neurological deficits, so we felt that this patient did     not have a stroke.  In the end, we recommended risk factor     modification to the patient including smoking cessation, daily     aspirin, and statin for hyperlipidemia. 2. Migraine headaches, which seemed to be quite frequent in nature.  I     have started the patient on propranolol for prevention purposes. 3. Symptoms consistent with polycystic ovarian syndrome with impaired  glucose tolerance, irregular menses, history of pelvic ultrasounds     with ovarian cyst.  I have placed the patient on a low-dose     metformin and she will follow up with her primary gynecologist. 4. Family history of hypercoagulable syndrome.  The patient had a     hypercoagulable panel sent out, results of which are pending at     time of my dictation. 5. Impaired glucose tolerance with a hemoglobin A1c of 6.  I have     educated the patient about diet, exercise, starting her on low-dose     metformin.     Lonia Blood, M.D.     SL/MEDQ  D:  11/02/2010  T:  11/02/2010  Job:  [id number]  Electronically Signed by Lonia Blood M.D. on 11/05/2010 08:56:34 AM

## 2010-11-06 LAB — PROTEIN C, TOTAL: Protein C, Total: 107 % (ref 70–140)

## 2010-11-06 LAB — PROTEIN S, TOTAL: Protein S Ag, Total: 24 % — ABNORMAL LOW (ref 70–140)

## 2010-11-07 LAB — CBC
HCT: 37.5 % (ref 36.0–46.0)
Hemoglobin: 12.6 g/dL (ref 12.0–15.0)
MCH: 29.7 pg (ref 26.0–34.0)
MCHC: 33.6 g/dL (ref 30.0–36.0)
MCV: 88.6 fL (ref 78.0–100.0)
Platelets: 194 10*3/uL (ref 150–400)
RBC: 4.23 MIL/uL (ref 3.87–5.11)
RDW: 14.5 % (ref 11.5–15.5)
WBC: 7.5 10*3/uL (ref 4.0–10.5)

## 2010-11-07 LAB — DIFFERENTIAL
Basophils Absolute: 0.1 10*3/uL (ref 0.0–0.1)
Basophils Relative: 1 % (ref 0–1)
Eosinophils Absolute: 0.1 10*3/uL (ref 0.0–0.7)
Eosinophils Relative: 2 % (ref 0–5)
Lymphocytes Relative: 35 % (ref 12–46)
Lymphs Abs: 2.7 10*3/uL (ref 0.7–4.0)
Monocytes Absolute: 0.5 10*3/uL (ref 0.1–1.0)
Monocytes Relative: 7 % (ref 3–12)
Neutro Abs: 4.2 10*3/uL (ref 1.7–7.7)
Neutrophils Relative %: 55 % (ref 43–77)

## 2010-11-07 LAB — WET PREP, GENITAL
Trich, Wet Prep: NONE SEEN
Yeast Wet Prep HPF POC: NONE SEEN

## 2010-11-07 LAB — URINALYSIS, ROUTINE W REFLEX MICROSCOPIC
Bilirubin Urine: NEGATIVE
Bilirubin Urine: NEGATIVE
Glucose, UA: NEGATIVE mg/dL
Glucose, UA: NEGATIVE mg/dL
Hgb urine dipstick: NEGATIVE
Hgb urine dipstick: NEGATIVE
Ketones, ur: NEGATIVE mg/dL
Ketones, ur: NEGATIVE mg/dL
Nitrite: NEGATIVE
Nitrite: NEGATIVE
Protein, ur: NEGATIVE mg/dL
Protein, ur: NEGATIVE mg/dL
Specific Gravity, Urine: 1.018 (ref 1.005–1.030)
Specific Gravity, Urine: 1.02 (ref 1.005–1.030)
Urobilinogen, UA: 0.2 mg/dL (ref 0.0–1.0)
Urobilinogen, UA: 0.2 mg/dL (ref 0.0–1.0)
pH: 6 (ref 5.0–8.0)
pH: 6.5 (ref 5.0–8.0)

## 2010-11-07 LAB — GC/CHLAMYDIA PROBE AMP, GENITAL
Chlamydia, DNA Probe: NEGATIVE
GC Probe Amp, Genital: NEGATIVE

## 2010-11-07 LAB — POCT I-STAT, CHEM 8
BUN: 7 mg/dL (ref 6–23)
Calcium, Ion: 1.16 mmol/L (ref 1.12–1.32)
Chloride: 103 mEq/L (ref 96–112)
Creatinine, Ser: 0.7 mg/dL (ref 0.4–1.2)
Glucose, Bld: 78 mg/dL (ref 70–99)
HCT: 38 % (ref 36.0–46.0)
Hemoglobin: 12.9 g/dL (ref 12.0–15.0)
Potassium: 3.5 mEq/L (ref 3.5–5.1)
Sodium: 139 mEq/L (ref 135–145)
TCO2: 27 mmol/L (ref 0–100)

## 2010-11-07 LAB — HCG, QUANTITATIVE, PREGNANCY: hCG, Beta Chain, Quant, S: <2 m[IU]/mL (ref <5)

## 2010-11-07 LAB — POCT PREGNANCY, URINE
Preg Test, Ur: NEGATIVE
Preg Test, Ur: NEGATIVE

## 2010-11-12 LAB — WET PREP, GENITAL
Clue Cells Wet Prep HPF POC: NONE SEEN
Trich, Wet Prep: NONE SEEN
Yeast Wet Prep HPF POC: NONE SEEN

## 2010-11-12 LAB — URINALYSIS, ROUTINE W REFLEX MICROSCOPIC
Bilirubin Urine: NEGATIVE
Glucose, UA: NEGATIVE mg/dL
Hgb urine dipstick: NEGATIVE
Ketones, ur: NEGATIVE mg/dL
Nitrite: NEGATIVE
Protein, ur: NEGATIVE mg/dL
Specific Gravity, Urine: 1.02 (ref 1.005–1.030)
Urobilinogen, UA: 1 mg/dL (ref 0.0–1.0)
pH: 7 (ref 5.0–8.0)

## 2010-11-12 LAB — CBC
HCT: 35.9 % — ABNORMAL LOW (ref 36.0–46.0)
Hemoglobin: 12 g/dL (ref 12.0–15.0)
MCH: 29.7 pg (ref 26.0–34.0)
MCHC: 33.5 g/dL (ref 30.0–36.0)
MCV: 88.4 fL (ref 78.0–100.0)
Platelets: 187 10*3/uL (ref 150–400)
RBC: 4.06 MIL/uL (ref 3.87–5.11)
RDW: 14.8 % (ref 11.5–15.5)
WBC: 8.6 10*3/uL (ref 4.0–10.5)

## 2010-11-12 LAB — GC/CHLAMYDIA PROBE AMP, GENITAL
Chlamydia, DNA Probe: NEGATIVE
GC Probe Amp, Genital: NEGATIVE

## 2010-11-12 LAB — POCT PREGNANCY, URINE: Preg Test, Ur: NEGATIVE

## 2010-11-15 NOTE — H&P (Signed)
NAMEMARYCRUZ, BOEHNER NO.:  [id number]  MEDICAL RECORD NO.:  [id number]           PATIENT TYPE:  E  LOCATION:  [facility]                         FACILITY:  [facility]  PHYSICIAN:  Houston Siren, MD           DATE OF BIRTH:  [birth year]  DATE OF ADMISSION:  10/30/2010 DATE OF DISCHARGE:                             HISTORY & PHYSICAL   PRIMARY CARE PHYSICIAN:  Unassigned.  ADVANCED DIRECTIVE:  Full code.  REASON FOR ADMISSION:  TIA/CVA, and hypertension.  HISTORY OF PRESENT ILLNESS:  This is a 30 year old female with history of asthma, GERD, hypertension, morbid obesity, prior DVT, who presents with about 8 hours prior to arrival to the emergency room at [facility] where she complained of left-sided headache, dysarthria, and right arm weakness and numbness.  Apparently, she was driving and she had to pull over using her left arm to drive as her right arm was too weak.  Her husband also noted some dysarthria and right facial droop as well.  When she got into the emergency room, her speech has gotten better, but her right arm is still feeling numb and weak.  She has no problems with blurry vision or lower extremity weakness.  She has no nausea, vomiting, fever, chills, stiff neck, or any other symptomatology.  Workup included her head CT which was negative.  A drug screen showed the presence of THC. Her urinalysis was negative, and she has normal blood count and electrolytes along with normal kidney functions.  EKG shows sinus rhythm at the rate of 90 with no acute ST-T changes.  After discussing by the emergency room physician with Dr. Terrace Arabia, the neurologist on-call, it was recommended that she be admitted for TIA/stroke workup.  PAST MEDICAL HISTORY:  Asthma, hypertension, abnormal Pap smear, acid reflux, depression, DVT, gallstones, morbid obesity, and ovarian cyst.  FAMILY HISTORY:  Coronary artery disease, cancers, diabetes, and hypertension.  Of note is her mother  reportedly had a "blood clot" at age 29.  PAST SURGICAL HISTORY:  Tubal ligation.  SOCIAL HISTORY:  She lives at home with her husband who worked as a Production designer, theatre/television/film for [employer].  She has six disabled children including premature birth trauma, diabetes, autism, speech impediment.  She admits to having significant stress because of it.  ALLERGIES:  To AMOXICILLIN causing difficult breathing and a rash.  CURRENT MEDICATIONS:  HCTZ and albuterol, she stated she had taken labetalol during pregnancy and it worked very well for her blood pressure.  REVIEW OF SYSTEMS:  Otherwise unremarkable.  PHYSICAL EXAMINATION:  VITAL SIGNS:  Blood pressure 120/50, pulse of 90, respiratory rate of 19, temperature 99.0. GENERAL:  Shows that she is an alert and oriented and is in no apparent distress.  Her speech is fluent, with very minimal dysarthria.  She has no aphasia.  Tongue is midline.  Uvula elevated with phonation.  The right side of her face is likely droopy compared to the left. HEENT:  Her pupil is round and reactive.  Fundoscopic exam is benign. NECK:  Supple.  No carotid bruits. CARDIAC:  Revealed S1 and S2, regular. LUNGS:  Clear. ABDOMINAL:  Obese, nondistended, nontender. EXTREMITIES:  Show no edema.  Strength equal bilaterally except the right hand grip is slightly weak than the left. SKIN:  Warm and dry. NEUROLOGIC:  Cerebellar exam is unremarkable. PSYCHIATRIC:  Normal as well.  OBJECTIVE FINDINGS:  CT scan of the head is negative.  Urinalysis was unremarkable.  White count of 10.2, hemoglobin of 13.3, platelet count of 244,000.  Serum sodium 137, potassium 4.1, creatinine 0.9, glucose of 97.  EKG showed normal sinus rhythm without any acute ST-T changes.  IMPRESSION:  This is a 30 year old female with history of migraine headache, presented with headache, but also has associated right upper extremity weakness and paresthesia, some dysarthria and right facial droop.  Differentials  would include transient ischemic attack or cerebrovascular accident on the left hemisphere or complex migraine or hypertensive encephalopathy.  Currently, her blood pressure is controlled and we will admit her for transient ischemic attack and cerebrovascular accident workup.  Because her mother had "blood clot" at age 38, I will get a full thrombophilic panel as well.  She is a full code, will be admitted to [facility].  We will try to keep her blood pressure in the 140-160 range.  I would discontinue her HCTZ. If her blood pressure rises, we will use labetalol.  Currently, she is stable.  Workup will include MRI and MRA of her brain along with carotid Dopplers and 2-D echo.  She will be treated with aspirin, and I strongly recommend that she stops smoking.  Keeping her blood pressure under control is important as well as stress management.  She was advised against drug use also.     Houston Siren, MD     PL/MEDQ  D:  10/31/2010  T:  10/31/2010  Job:  [id number]  Electronically Signed by Houston Siren  on 11/15/2010 02:00:31 AM

## 2010-11-28 LAB — URINALYSIS, ROUTINE W REFLEX MICROSCOPIC
Bilirubin Urine: NEGATIVE
Glucose, UA: NEGATIVE mg/dL
Hgb urine dipstick: NEGATIVE
Ketones, ur: NEGATIVE mg/dL
Leukocytes, UA: NEGATIVE
Nitrite: NEGATIVE
Protein, ur: 30 mg/dL — AB
Specific Gravity, Urine: 1.028 (ref 1.005–1.030)
Urobilinogen, UA: 1 mg/dL (ref 0.0–1.0)
pH: 7.5 (ref 5.0–8.0)

## 2010-11-28 LAB — POCT I-STAT, CHEM 8
BUN: 8 mg/dL (ref 6–23)
Calcium, Ion: 1.09 mmol/L — ABNORMAL LOW (ref 1.12–1.32)
Chloride: 104 mEq/L (ref 96–112)
Creatinine, Ser: 0.8 mg/dL (ref 0.4–1.2)
Glucose, Bld: 121 mg/dL — ABNORMAL HIGH (ref 70–99)
HCT: 39 % (ref 36.0–46.0)
Hemoglobin: 13.3 g/dL (ref 12.0–15.0)
Potassium: 3.2 mEq/L — ABNORMAL LOW (ref 3.5–5.1)
Sodium: 140 mEq/L (ref 135–145)
TCO2: 26 mmol/L (ref 0–100)

## 2010-11-28 LAB — CBC
HCT: 37.3 % (ref 36.0–46.0)
Hemoglobin: 12.6 g/dL (ref 12.0–15.0)
MCHC: 33.8 g/dL (ref 30.0–36.0)
MCV: 86.2 fL (ref 78.0–100.0)
Platelets: 212 10*3/uL (ref 150–400)
RBC: 4.33 MIL/uL (ref 3.87–5.11)
RDW: 15.1 % (ref 11.5–15.5)
WBC: 8.3 10*3/uL (ref 4.0–10.5)

## 2010-11-28 LAB — DIFFERENTIAL
Basophils Absolute: 0 10*3/uL (ref 0.0–0.1)
Basophils Relative: 1 % (ref 0–1)
Eosinophils Absolute: 0.1 10*3/uL (ref 0.0–0.7)
Eosinophils Relative: 1 % (ref 0–5)
Lymphocytes Relative: 30 % (ref 12–46)
Lymphs Abs: 2.5 10*3/uL (ref 0.7–4.0)
Monocytes Absolute: 0.8 10*3/uL (ref 0.1–1.0)
Monocytes Relative: 10 % (ref 3–12)
Neutro Abs: 4.8 10*3/uL (ref 1.7–7.7)
Neutrophils Relative %: 59 % (ref 43–77)

## 2010-11-28 LAB — URINE MICROSCOPIC-ADD ON

## 2010-11-28 LAB — D-DIMER, QUANTITATIVE (NOT AT ARMC): D-Dimer, Quant: 0.46 ug/mL-FEU (ref 0.00–0.48)

## 2010-11-28 LAB — POCT PREGNANCY, URINE: Preg Test, Ur: NEGATIVE

## 2010-12-01 LAB — PREGNANCY, URINE: Preg Test, Ur: NEGATIVE

## 2010-12-01 LAB — DIFFERENTIAL
Basophils Absolute: 0 10*3/uL (ref 0.0–0.1)
Basophils Relative: 0 % (ref 0–1)
Eosinophils Absolute: 0.1 10*3/uL (ref 0.0–0.7)
Eosinophils Relative: 1 % (ref 0–5)
Lymphocytes Relative: 36 % (ref 12–46)
Lymphs Abs: 2.7 10*3/uL (ref 0.7–4.0)
Monocytes Absolute: 0.6 10*3/uL (ref 0.1–1.0)
Monocytes Relative: 9 % (ref 3–12)
Neutro Abs: 4.1 10*3/uL (ref 1.7–7.7)
Neutrophils Relative %: 54 % (ref 43–77)

## 2010-12-01 LAB — URINALYSIS, ROUTINE W REFLEX MICROSCOPIC
Glucose, UA: NEGATIVE mg/dL
Hgb urine dipstick: NEGATIVE
Ketones, ur: NEGATIVE mg/dL
Nitrite: NEGATIVE
Protein, ur: NEGATIVE mg/dL
Specific Gravity, Urine: 1.024 (ref 1.005–1.030)
Urobilinogen, UA: 2 mg/dL — ABNORMAL HIGH (ref 0.0–1.0)
pH: 7 (ref 5.0–8.0)

## 2010-12-01 LAB — CBC
HCT: 37.3 % (ref 36.0–46.0)
Hemoglobin: 12.5 g/dL (ref 12.0–15.0)
MCHC: 33.5 g/dL (ref 30.0–36.0)
MCV: 87.2 fL (ref 78.0–100.0)
Platelets: 204 10*3/uL (ref 150–400)
RBC: 4.28 MIL/uL (ref 3.87–5.11)
RDW: 14.3 % (ref 11.5–15.5)
WBC: 7.5 10*3/uL (ref 4.0–10.5)

## 2010-12-01 LAB — COMPREHENSIVE METABOLIC PANEL
ALT: 171 U/L — ABNORMAL HIGH (ref 0–35)
AST: 335 U/L — ABNORMAL HIGH (ref 0–37)
Albumin: 3.7 g/dL (ref 3.5–5.2)
Alkaline Phosphatase: 106 U/L (ref 39–117)
BUN: 8 mg/dL (ref 6–23)
CO2: 26 mEq/L (ref 19–32)
Calcium: 9.1 mg/dL (ref 8.4–10.5)
Chloride: 103 mEq/L (ref 96–112)
Creatinine, Ser: 0.72 mg/dL (ref 0.4–1.2)
GFR calc Af Amer: >60 mL/min (ref >60)
GFR calc non Af Amer: >60 mL/min (ref >60)
Glucose, Bld: 98 mg/dL (ref 70–99)
Potassium: 4.2 mEq/L (ref 3.5–5.1)
Sodium: 136 mEq/L (ref 135–145)
Total Bilirubin: 1.3 mg/dL — ABNORMAL HIGH (ref 0.3–1.2)
Total Protein: 7.7 g/dL (ref 6.0–8.3)

## 2010-12-01 LAB — URINE MICROSCOPIC-ADD ON

## 2010-12-01 LAB — LIPASE, BLOOD: Lipase: 18 U/L (ref 11–59)

## 2010-12-02 LAB — POCT I-STAT, CHEM 8
BUN: 12 mg/dL (ref 6–23)
Calcium, Ion: 1.06 mmol/L — ABNORMAL LOW (ref 1.12–1.32)
Chloride: 107 mEq/L (ref 96–112)
Creatinine, Ser: 0.8 mg/dL (ref 0.4–1.2)
Glucose, Bld: 87 mg/dL (ref 70–99)
HCT: 42 % (ref 36.0–46.0)
Hemoglobin: 14.3 g/dL (ref 12.0–15.0)
Potassium: 3.9 mEq/L (ref 3.5–5.1)
Sodium: 137 mEq/L (ref 135–145)
TCO2: 22 mmol/L (ref 0–100)

## 2010-12-02 LAB — URINE MICROSCOPIC-ADD ON

## 2010-12-02 LAB — URINALYSIS, ROUTINE W REFLEX MICROSCOPIC
Bilirubin Urine: NEGATIVE
Glucose, UA: NEGATIVE mg/dL
Hgb urine dipstick: NEGATIVE
Ketones, ur: 15 mg/dL — AB
Leukocytes, UA: NEGATIVE
Nitrite: NEGATIVE
Protein, ur: 30 mg/dL — AB
Specific Gravity, Urine: 1.034 — ABNORMAL HIGH (ref 1.005–1.030)
Urobilinogen, UA: 1 mg/dL (ref 0.0–1.0)
pH: 6 (ref 5.0–8.0)

## 2010-12-02 LAB — POCT PREGNANCY, URINE: Preg Test, Ur: NEGATIVE

## 2010-12-03 LAB — URINE MICROSCOPIC-ADD ON

## 2010-12-03 LAB — RAPID URINE DRUG SCREEN, HOSP PERFORMED
Amphetamines: NOT DETECTED
Barbiturates: NOT DETECTED
Benzodiazepines: NOT DETECTED
Cocaine: NOT DETECTED
Opiates: NOT DETECTED
Tetrahydrocannabinol: NOT DETECTED

## 2010-12-03 LAB — CBC
HCT: 36.5 % (ref 36.0–46.0)
HCT: 36.7 % (ref 36.0–46.0)
Hemoglobin: 12.4 g/dL (ref 12.0–15.0)
Hemoglobin: 12.2 g/dL (ref 12.0–15.0)
MCHC: 33.8 g/dL (ref 30.0–36.0)
MCHC: 33.3 g/dL (ref 30.0–36.0)
MCV: 87.7 fL (ref 78.0–100.0)
MCV: 86.8 fL (ref 78.0–100.0)
Platelets: 209 10*3/uL (ref 150–400)
Platelets: 189 10*3/uL (ref 150–400)
RBC: 4.17 MIL/uL (ref 3.87–5.11)
RBC: 4.23 MIL/uL (ref 3.87–5.11)
RDW: 14.5 % (ref 11.5–15.5)
RDW: 15 % (ref 11.5–15.5)
WBC: 7.8 10*3/uL (ref 4.0–10.5)
WBC: 6.7 10*3/uL (ref 4.0–10.5)

## 2010-12-03 LAB — DIFFERENTIAL
Basophils Absolute: 0 10*3/uL (ref 0.0–0.1)
Basophils Absolute: 0 10*3/uL (ref 0.0–0.1)
Basophils Relative: 0 % (ref 0–1)
Basophils Relative: 1 % (ref 0–1)
Eosinophils Absolute: 0.1 10*3/uL (ref 0.0–0.7)
Eosinophils Absolute: 0.2 10*3/uL (ref 0.0–0.7)
Eosinophils Relative: 2 % (ref 0–5)
Eosinophils Relative: 2 % (ref 0–5)
Lymphocytes Relative: 26 % (ref 12–46)
Lymphocytes Relative: 35 % (ref 12–46)
Lymphs Abs: 2 10*3/uL (ref 0.7–4.0)
Lymphs Abs: 2.3 10*3/uL (ref 0.7–4.0)
Monocytes Absolute: 0.5 10*3/uL (ref 0.1–1.0)
Monocytes Absolute: 0.5 10*3/uL (ref 0.1–1.0)
Monocytes Relative: 7 % (ref 3–12)
Monocytes Relative: 7 % (ref 3–12)
Neutro Abs: 5.1 10*3/uL (ref 1.7–7.7)
Neutro Abs: 3.7 10*3/uL (ref 1.7–7.7)
Neutrophils Relative %: 65 % (ref 43–77)
Neutrophils Relative %: 56 % (ref 43–77)

## 2010-12-03 LAB — BASIC METABOLIC PANEL
BUN: 9 mg/dL (ref 6–23)
CO2: 27 mEq/L (ref 19–32)
Calcium: 8.9 mg/dL (ref 8.4–10.5)
Chloride: 104 mEq/L (ref 96–112)
Creatinine, Ser: 0.64 mg/dL (ref 0.4–1.2)
GFR calc Af Amer: >60 mL/min (ref >60)
GFR calc non Af Amer: >60 mL/min (ref >60)
Glucose, Bld: 84 mg/dL (ref 70–99)
Potassium: 4.1 mEq/L (ref 3.5–5.1)
Sodium: 137 mEq/L (ref 135–145)

## 2010-12-03 LAB — URINALYSIS, ROUTINE W REFLEX MICROSCOPIC
Bilirubin Urine: NEGATIVE
Glucose, UA: NEGATIVE mg/dL
Ketones, ur: NEGATIVE mg/dL
Leukocytes, UA: NEGATIVE
Nitrite: NEGATIVE
Protein, ur: NEGATIVE mg/dL
Specific Gravity, Urine: >1.03 — ABNORMAL HIGH (ref 1.005–1.030)
Urobilinogen, UA: 0.2 mg/dL (ref 0.0–1.0)
pH: 5.5 (ref 5.0–8.0)

## 2010-12-03 LAB — ETHANOL: Alcohol, Ethyl (B): <5 mg/dL (ref 0–10)

## 2010-12-03 LAB — POCT PREGNANCY, URINE: Preg Test, Ur: NEGATIVE

## 2010-12-06 LAB — WET PREP, GENITAL
Trich, Wet Prep: NONE SEEN
Yeast Wet Prep HPF POC: NONE SEEN

## 2010-12-06 LAB — GC/CHLAMYDIA PROBE AMP, GENITAL
Chlamydia, DNA Probe: NEGATIVE
GC Probe Amp, Genital: POSITIVE — AB

## 2010-12-07 LAB — POCT PREGNANCY, URINE: Preg Test, Ur: NEGATIVE

## 2011-01-09 NOTE — Op Note (Signed)
Maria Carpenter, MONDS NO.:  [id number]   MEDICAL RECORD NO.:  [id number]          PATIENT TYPE:  INP   LOCATION:  9124                          FACILITY:  [facility]   PHYSICIAN:  Lazaro Arms, M.D.   DATE OF BIRTH:  May 26, 1981   DATE OF PROCEDURE:  10/05/2007  DATE OF DISCHARGE:                               OPERATIVE REPORT   PREOPERATIVE DIAGNOSIS:  Multiparous female who desires permanent  sterilization.   POSTOPERATIVE DIAGNOSIS:  Multiparous female who desires permanent  sterilization.   PROCEDURES:  A postpartum bilateral tubal ligation.   SURGEON:  Lazaro Arms, M.D.   ANESTHESIA:  Spinal.   FINDINGS:  The patient had normal postpartum uterus up to the level of  umbilicus.  The tubes and ovaries appeared to be normal.   DESCRIPTION OF OPERATION:  The patient was taken to the operating room,  placed in sitting position underwent spinal anesthetic and placed in  dorsal lithotomy position.  She was prepped and draped in the usual  sterile fashion.  Her bladder was drained.  Incision was made below the  umbilicus and carried down sharply to the rectus fascia which was  incised sharply with scissors.  The peritoneum was then entered  manually.  A lap tape attached to a hemostat was then placed to brush  away the omentum on the right.  The right fallopian tube was identified,  was relatively short and small and so I did sort of a modified Karrie Doffing.  I put the 2-0 plane through the peritoneum below the fallopian tube and  actually tied on the outside of the fimbria because of the short length  of the tube, put a modified Karrie Doffing and removed this 2.5 to 3 cm tubal  segment.  This was done bilaterally.  There was good hemostasis  bilaterally.  The sutures were both cut and again there was no bleeding  of the tubal stump.  The fascia was then closed with running 0-0 Vicryl  suture.  Subcutaneous tissue was hemostatic.  Skin was closed using 3-0,  4-0 Vicryl on a  Keith needle in a subcuticular fashion.  Dermabond was  placed as well.  The patient tolerated the procedure well.  She  experienced no blood loss, taken recovery room in stable condition.  All  counts correct x3.      Lazaro Arms, M.D.  Electronically Signed     LHE/MEDQ  D:  10/05/2007  T:  10/06/2007  Job:  [id number]

## 2011-01-09 NOTE — Discharge Summary (Signed)
NAMESHIRLINE, Maria Carpenter            ACCOUNT NO.:  [id number]   MEDICAL RECORD NO.:  [id number]          PATIENT TYPE:  INP   LOCATION:  9124                          [facility]   PHYSICIAN:  Ginger Carne, MD  DATE OF BIRTH:  1980-12-10   DATE OF ADMISSION:  10/04/2007  DATE OF DISCHARGE:  10/07/2007                               DISCHARGE SUMMARY   DISCHARGE DIAGNOSES:  1. Normal spontaneous vaginal delivery of healthy baby boy at 36 weeks      and 2 days' gestation, preterm delivery  2. Chronic hypertension.  3. Gestational diabetes, insulin controlled.  4. History of depression.  5. Morbid obesity.  6. Smoking abuse.   PROCEDURES:  1. Nonstress test.  2. Ultrasound.  3. Amniocentesis for lung maturity which was positive.  4. Bilateral tubal ligation on February 8.   DISCHARGE LABS:  CBC with hemoglobin of 10.2, hematocrit 30.2, platelets  170.  [name].  Antibody positive and received RhoGAM postpartum.  PT 12.8, INR 0.9, PTT 31, antithrombin 385, within normal limits.  Rest  of thrombophilia workup pending.  Rubella immune.  HB surface antigen  negative.  RPR nonreactive.  HIV nonreactive.  Chlamydia and gonorrhea  negative.  GBS negative.   HOSPITAL COURSE:  This is a 30 year old G8, P0-6-2-6 who presented at 91  weeks' gestation and was initially admitted to antenatal for monitoring  for her poorly controlled gestational diabetes who had chronic  hypertension who was induced secondary to poor glycemic control and  beginning of contractions and delivered a healthy baby boy weighing 7  pounds 4 ounces with Apgar's of 8 and 9 at 1 minute and 5 minutes  respectively with no complications.  No lacerations were noted either.  Patient was induced with pitocin.  Previous to delivery, gestational  diabetes was controlled on NPH and regular insulin.  After delivery,  insulin was no longer needed.  Chronic hypertension was controlled with  labetalol 300 t.i.d. and  after delivery patient was sent home to  continue labetalol 400 b.i.d.  Patient to follow up with PCP.  Patient  also underwent bilateral tubal ligation on February 8 by Dr. Despina Hidden under  spinal anesthesia and no complications were noted.  Patient tolerated  this well.  Of note, patient was O- and antibody positive.  Patient was  given RhoGAM in the hospital.  Patient desires to have circumcision at  an outside facility due to cost.  Given her history of postpartum  depression she is to follow up with PCP and to be started on Zoloft if  required.  Patient denied a script for Zoloft, said she would rather  follow up with her primary care doctor for this.  She has good social  support at home per patient.  Patient will be bottle feeding and of note  patient gave history of having a DVT 3 years ago in [place], but was  not treated.  No evidence of this in the chart, only that mother has  history of blood clots in the legs.  Regardless, Lovenox 60 mg subcu  daily was started during this  hospitalization and patient to complete a  6-week course of this upon discharge.   DISCHARGE MEDICATIONS:  1. Lovenox 60 mg subcu daily for 6 weeks.  2. Labetalol 400 mg 1 p.o. b.i.d.  3. Percocet 5/325 mg 1 p.o. every 4 hours p.r.n. pain.  4. Motrin 600 mg 1 p.o. every 6 hours p.r.n. abdominal cramping.  5. Prenatal vitamins daily.  6. Colace 100 mg 1 p.o. b.i.d. p.r.n. constipation.  7. Previous Prilosec regimen.  8. Previous albuterol 2 puffs Q4 hours prn wheezing  9. Previous Ambien.   FOLLOWUP ISSUES FOR FOLLOWUP:  1. Rest of thrombophilia workup pending.  2. Patient to follow up with PCP at [facility] in      [place].  Patient to call and make an appointment with them.  3. Patient to follow up with [facility] Department for 6-      week postpartum check.      Maria Boyden, MD      Ginger Carne, MD  Electronically Signed    JG/MEDQ  D:  10/07/2007  T:   10/08/2007  Job:  [id number]

## 2011-01-09 NOTE — Discharge Summary (Signed)
NAMELOUISA, Carpenter            ACCOUNT NO.:  [id number]   MEDICAL RECORD NO.:  [id number]          PATIENT TYPE:  INP   LOCATION:  9158                          [facility]ACILITY[facility]  [facility]   PHYSICIAN:  Tanya S. Shawnie Pons, M.D.   DATE OF BIRTH:  1981/08/20   DATE OF ADMISSION:  09/26/2007  DATE OF DISCHARGE:  09/28/2007                               DISCHARGE SUMMARY   FINAL DIAGNOSES:  1. Chronic hypertension.  2. Elevated blood pressure.  3. Gestational diabetes A2.  4. Obesity.  5. Gallstones.  6. Intrauterine pregnancy at 35-4/7th's weeks.   SIGNIFICANT LABORATORY:  Normal PIH panel.  A 24-hour urine that showed  80 mg of protein.   REASON FOR ADMISSION:  Briefly, the patient is a 30 year old, gravida 8,  para 0-5-2-5 who has had all her babies around 35 weeks with chronic  hypertension and A2 diabetes.  She has also been diagnosed with  gallstones this pregnancy.  She presented to MFM with elevated blood  pressures in the 180s over 110s there and was sent over for admission to  rule out severe pre-eclampsia.   HOSPITAL COURSE:  The patient was admitted and put on bedrest.  Her home  meds were resumed including labetalol and insulin.  Her blood pressure  was in the 110s to 120s over 60s and 70s without any significant  elevation.  The patient does have a history of dexamethasone early  January with no need for that to be repeated.  The patient's blood  sugars and blood pressures were followed throughout her hospital course  and she completed a 24-hour urine which was back on the morning of  discharge.  She had no signs or symptoms of worsening pre-eclampsia.  The patient did undergo some contractions on the evening of September 27, 2007, but two checks approximately 2 hours apart failed to show any  significant change in her cervical dilatation and her contractions  petered out after that.  Given her blood pressure and sugar control, it  was felt the patient was stable for discharge.   She already has a plan  for delivery including a tap at 36 weeks.  Delivery at 37 if the tap is  immature.   DISCHARGE DISPOSITION/CONDITION:  The patient is discharged home in fair  condition.   FOLLOWUP:  [facility], September 29, 2007, where she will  complete twice weekly testing and sugar control.  As stated previously  she is for a tap at 36 weeks, delivery at 37 regardless of tap results.   DISCHARGE MEDICATIONS:  1. Labetalol 300 mg p.o. t.i.d.  2. Albuterol MDI 2 puffs every 4 hours as needed.  3. Prilosec 20 mg q.h.s.  4. Prenatal vitamins daily.  5. Insulin:  NPH 89 units q.a.m. 49 units q.h.s. and Regular 49 units      q.a.m. and 39 units q.a.c. dinner.   The patient is also instructed to return with headache, blurry vision,  or right upper quadrant pain that seems different than her gallstone  pain.      Maria Carpenter. Shawnie Pons, M.D.  Electronically Signed  TSP/MEDQ  D:  09/28/2007  T:  09/28/2007  Job:  [id number]

## 2011-01-09 NOTE — Discharge Summary (Signed)
NAME:  Maria Carpenter, HAPPEL NO.:  [id number]   MEDICAL RECORD NO.:  [id number]          PATIENT TYPE:  WOC   LOCATION:  WOC                          FACILITY:  [facility]   PHYSICIAN:  Phil D. Okey Dupre, M.D.     DATE OF BIRTH:  11/27/1980   DATE OF ADMISSION:  DATE OF DISCHARGE:                               DISCHARGE SUMMARY   ADMISSION DIAGNOSES:  1. Intrauterine pregnancy at 23 weeks 0 days.  2. Chronic hypertension.  3. Elevated blood pressure, headache, vision changes, epigastric pain.  4. Class A-1 gestational diabetes mellitus.  5. History of preterm delivery x5.  6. History of pre-eclampsia.  7. Smoker.   DISCHARGE DIAGNOSES:  1. Intrauterine pregnancy at 23 weeks 2 days.  2. Chronic hypertension.  3. Proteinuria.  4. Class A-1 gestational diabetes.  5. History of preterm delivery x5.  6. History of pre-eclampsia in previous pregnancy.  7. Smoker.   PROCEDURES:  The patient did have an ultrasound performed on July 02, 2007 showing single gestation, cephalic presentation with fundal  placenta.  Amniotic fluid index was within normal limits.  Estimated  fetal weight was 690 grams at the 75th percentile.  Ultrasound growth  was consistent with initial ultrasound.   COMPLICATIONS:  None.   CONSULTATION:  Internal Fetal Medicine, Dr. Margot Ables.   PERTINENT LABORATORY FINDINGS:  The patient had admission urinalysis  showing no protein, positive nitrites.  Microscopy showed bacteria too  numerous to count, 1-2 white blood cells.  Urine culture grew out  greater than 100,000 E. Coli without sensitivities.  Admission CBC  showed white blood count 7.6.  Hemoglobin 11.4.  Hematocrit 32.9.  Platelets 195.  Complete metabolic panel showed AST 12, ALT 14, glucose  84.  The remainder of the labs were within normal limits.  LDH was 77.  Uric acid was 3.9.  Gonorrhea was negative.  Chlamydia ws negative.  GBS  negative.  A 24 hour urine showed volume of 1800 mL.  Protein  was 162  mg.  Creatinine was 1746 with creatinine clearance of 269.   PERTINENT ADMISSION HISTORY:  This is a 30 year old gravida 8, para 0-5-  2-5 at 23 weeks 0 days gestation by 8-week ultrasound that was being  evaluated at maternal-fetal medicine for followup amniotic survey and  was found to have blood pressures in the severe range of 154-177  systolic over 94-103 diastolic.  The patient also complained of  headache, epigastric pain, and blurry vision.  It was determined that  the patient needed to be admitted for evaluation for possible  superimposed pre-eclampsia.   HOSPITAL COURSE:  The patient was admitted and placed on bed rest.  PIH  labs were drawn, and found to be within normal limits as stated above.  NSTs were done daily which showed reassuring strips that were not  reactive.  A 24-hour was collected with results stated above of 162  grams of protein.  Blood pressures improved during her stay to 105-130  systolic over 48-74 diastolic.  Her antihypertensive of Labetalol was  increased on admission from 200 mg t.i.d. to 300 mg t.i.d.  The  patient's headache, blurry vision, and abdominal pain had resolved.  She  was treated for an urinary tract infection with Macrobid twice daily to  complete a 7 day course.  Her symptoms had resolved.  Her blood  pressures were under control and she was doing well.  CBGs were slightly  elevated, fasting 107, 127 postprandial.  She was in stable condition  and ready for discharge.   DISCHARGE ACTIVITIES:  Stable.   DISCHARGE MEDICATIONS:  1. Labetalol 300 mg p.o. t.i.d.  2. Macrobid 100 mg p.o. twice daily for 5 days to complete a 7 day      course.  3. Albuterol 1-2 inhalations every 4 hours as needed for shortness of      breath.  4. Prenatal vitamins 1 tablet p.o. daily.   DISCHARGE INSTRUCTIONS:  1. Discharge to home.  2. Diabetic diet.  3. Activity as tolerated.  4. Check capillary blood glucoses fasting every morning and 2  hours      after each meal.  5. The patient is to follow up at [facility] on July 07, 2007 at 9:15 a.m.  6. The patient is to follow up at [facility] on      July 29, 2007 at 12:45 a.m.      Karlton Lemon, MD  Electronically Signed     ______________________________  Javier Glazier. Okey Dupre, M.D.    NS/MEDQ  D:  08/11/2007  T:  08/11/2007  Job:  [id number]

## 2011-01-09 NOTE — Discharge Summary (Signed)
Maria Carpenter, Maria Carpenter            ACCOUNT NO.:  [id number]   MEDICAL RECORD NO.:  [id number]          PATIENT TYPE:  INP   LOCATION:  9156                          FACILITY:  [facility]   PHYSICIAN:  Norton Blizzard, MD    DATE OF BIRTH:  04-06-1981   DATE OF ADMISSION:  09/11/2007  DATE OF DISCHARGE:                               DISCHARGE SUMMARY   ADMISSION DIAGNOSES:  1. Intrauterine gestation at 33 weeks 1 day.  2. Chronic hypertension.  3. Gestational diabetes.  4. History of preterm delivery x5.  5. History of preeclampsia.  6. Nonreactive nonstress test with a biophysical profile of 6/8.   DISCHARGE DIAGNOSES:  1. Intrauterine pregnancy at 33 weeks 3 days.  2. Chronic hypertension.  3. Gestational diabetes status post dexamethasone.  4. History of preterm deliveries.  5. Preeclampsia has been ruled out.  6. Reactive nonstress test with biophysical profile of 10/10.   PROCEDURES:  1. The patient had an ultrasound performed prior to admission with a      biophysical profile of 6/8.  2. The patient had a repeat biophysical profile at 6:30 p.m. on      September 11, 2007, with single gestation cephalic presentation.      Placenta was posterior above the cervix.  AFI was 15.4 cm in the      55th percentile and BPP was 8/8.   COMPLICATIONS:  None.   CONSULTATIONS:  None.   PERTINENT LABORATORY FINDINGS:  The patient had PIH labs done in the  clinic with hemoglobin 11.9, hematocrit 34.9, white blood cell count  7.5, platelets 201.  AST 12, ALT 11, creatinine 0.44, uric acid 4.1.  A  24-hour urine with volume of 2400 mL, protein of 96 mg, creatinine  clearance of 205 and a creatinine of 1711.   BRIEF PERTINENT ADMISSION HISTORY:  Maria Carpenter is a 30 year old G8, P0-5-  2-5 presenting at 33 weeks 1 day after being seen in clinic and having  nonreactive NST.  She was sent to maternal-fetal medicine where she had  a biophysical profile of 6/8 with two counted off for nonsustained  breathing movement.  Her blood pressure was also noted to be elevated  there of 147/99 to 152/97.  She was admitted for observation,  reassessment of fetal well-being, steroid administration, and assessment  for preeclampsia.   HOSPITAL COURSE:  The patient was admitted and started on NST daily.  She had a reactive NST on day #1 of her hospitalization.  She also had a  repeat biophysical profile on hospital day #1 which was 10/10; NST was  reactive throughout her stay.  She did get dexamethasone x4 doses which  caused her blood sugars to elevate, and her insulin was titrated up  about 20-30% for all of her doses including morning NPH, nighttime NPH,  morning regular and nighttime regular.  Her blood pressures were  monitored throughout her stay.  Her initial BP was 136/80 followed by BP  in the range of 112 to 140 systolic over 55 to 80 diastolic.  Throughout  her stay her abdomen was soft, nontender.  She  did not complain of any  contractions.  Her blood sugars were elevated as high as 120 fasting and  179 post prandial.  Her PIH labs were followed which were within normal  limits.  Her 24-hour urine showed 96 mg of protein.  She was to be  discharged in stable condition.   DISCHARGE STATUS:  Stable.   DISCHARGE MEDICATIONS:  The patient is to continue home medications of:  1. Labetalol 300 mg p.o. t.i.d.  2. Prilosec 20 mg daily.  3. Tylenol p.r.n.  4. Prilosec p.r.n.  5. NPH insulin 80 units s.c. in the morning, 45 units s.c. before      bedtime.  6. Regular insulin 38 units s.c. before breakfast and 29 units s.c.      before dinner.   DISCHARGE INSTRUCTIONS:  1. Discharge to home.  2. A 2000-calorie American Diabetic Association diet.  3. Activity as tolerated.  4. Follow up at high-risk clinic at previously scheduled appointment      on Monday, September 15, 2007.      Karlton Lemon, MD      Norton Blizzard, MD  Electronically Signed    NS/MEDQ  D:  09/13/2007   T:  09/13/2007  Job:  [id number]

## 2011-01-12 NOTE — Group Therapy Note (Signed)
NAME:  Maria Carpenter, Maria Carpenter NO.:  [id number]   MEDICAL RECORD NO.:  [id number]          PATIENT TYPE:  WOC   LOCATION:  [facility]                   FACILITY:  [facility]   PHYSICIAN:  Tinnie Gens, MD        DATE OF BIRTH:  [birth year]   DATE OF SERVICE:  12/20/2005                                    CLINIC NOTE   CHIEF COMPLAINT:  IUD insertion.   HISTORY OF PRESENT ILLNESS:  This patient is a 30 year old gravida 6, para 5-  0-1-5 who has amenorrhea probably related to anovulation related to obesity.  She had a negative TSH, FSH and prolactin on her last visit.  It was felt  that the patient would benefit from IUD insertion for endometrial protection  and for birth control.  The patient has not had intercourse since her last  visit which was on November 29, 2005.  She has a negative pregnancy test today.   PHYSICAL EXAMINATION:  GENERAL:  She is an obese female.  VITAL SIGNS:  Her vital signs are as noted in the chart.  MENTAL STATUS:  She is in no acute distress.  GASTROINTESTINAL:  Abdomen is soft, nontender, nondistended.  GU:  Normal external female genitalia.  Vagina is pink and rugated.  The  cervix is parous and without lesion.   PROCEDURE:  The anterior lip of the cervix was grasped with a single-tooth  tenaculum.  The uterus was sounded to 11 cm.  The Mirena IUD is inserted  without difficulty.  The strings were cut to 1.5 cm.  The patient tolerated  the procedure well.   IMPRESSION:  Intrauterine device insertion.   PLAN:  Follow up in one month for a recheck of her strings.           ______________________________  Tinnie Gens, MD     TP/MEDQ  D:  12/20/2005  T:  12/21/2005  Job:  [id number]

## 2011-01-12 NOTE — Group Therapy Note (Signed)
NAME:  Maria Carpenter, Maria Carpenter NO.:  [id number]   MEDICAL RECORD NO.:  [id number]          PATIENT TYPE:  WOC   LOCATION:  [facility]                   FACILITY:  [facility]   PHYSICIAN:  Tinnie Gens, MD        DATE OF BIRTH:  11-02-1980   DATE OF SERVICE:  11/29/2005                                    CLINIC NOTE   CHIEF COMPLAINT:  Amenorrhea.   HISTORY OF PRESENT ILLNESS:  The patient is a 30 year old gravida 6, para 5-  0-1-5, who apparently presented to [facility]U twice withthree monthsof amenorrhea  and pregnancy symptoms.  She has had negative urine and serum hCGs  previously, she reports, but she has always had negative hCGs until she was  4 or 5 months along and that she even had a miscarriage related to Provera  possibly because she had a negative pregnancy test at that time.  The  patient also reports with all of her pregnancies she had placenta abruptions  as well as diabetes and hypertension/preeclampsia with all of her  pregnancies.  The patient is not currently using birth control except for  condoms; she is interested in more permanent birth control, if possible.  She has twice requested pelvic ultrasound which has not been done yet.  The  patient reports some cramping and mild spotting in the last few days.  The  patient has not taken the Provera that was prescribed to her through the  [facility]U.   PAST MEDICAL HISTORY:  Significant for hypertension, asthma, pneumonia,  postpartum depression, juvenile arthritis.   SURGICAL HISTORY:  She had bone surgery on her left ring finger.   ALLERGIES:  AMOXICILLIN.   MEDICATIONS:  HCTZ and albuterol.   OB HISTORY:  She is a G6, P5, with 5 vaginal deliveries, all 1 or 2 months  premature.   GYN HISTORY:  Menarche at age 78, cycles are usually regular, last 6-7 days,  heavy flow and mild pain.  Last Pap was in 2005.  She has no history of  abnormal Paps.   FAMILY HISTORY:  Significant for diabetes, coronary artery disease,  hypertension.   SOCIAL HISTORY:  Occasional alcohol, no tobacco.   On exam, her vitals are as noted in the chart.  Her blood pressure is 140/93  today.  She forgot to take her HCTZ this morning.  She is an obese black  female in no acute distress.  GU:  Normal external female genitalia.  BUS  was normal.  The vagina was pink and regular.  Cervix is parous and without  lesion.  The uterus feels small, anteverted, there is no adnexal mass or  tenderness; however, pelvic exam is limited by body habitus.  UPT is  negative today.  Informal ultrasound reveals very small uterus, no IUP  noted.   IMPRESSION:  1.  Yearly exam with Pap.  2.  Amenorrhea.  3.  Hypertension.  4.  Asthma.  5.  Morbid obesity.   PLAN:  1.  Check TSH, FSH and prolactin today.  2.  Provera challenge with withdrawal bleeding.  3.  Return for IUD insertion.  Condom use  until such time as she can come      back.           ______________________________  Tinnie Gens, MD     TP/MEDQ  D:  11/29/2005  T:  11/30/2005  Job:  [id number]

## 2011-01-25 ENCOUNTER — Inpatient Hospital Stay (HOSPITAL_COMMUNITY): Payer: Self-pay

## 2011-01-25 ENCOUNTER — Inpatient Hospital Stay (HOSPITAL_COMMUNITY)
Admission: AD | Admit: 2011-01-25 | Discharge: 2011-01-25 | Disposition: A | Discharge status: Home / Self Care | Payer: Self-pay | Source: Ambulatory Visit | Attending: Obstetrics & Gynecology | Admitting: Obstetrics & Gynecology

## 2011-01-25 DIAGNOSIS — R1032 Left lower quadrant pain: Secondary | ICD-10-CM

## 2011-01-25 DIAGNOSIS — R112 Nausea with vomiting, unspecified: Secondary | ICD-10-CM

## 2011-01-25 DIAGNOSIS — R51 Headache: Secondary | ICD-10-CM | POA: Insufficient documentation

## 2011-01-25 LAB — URINALYSIS, ROUTINE W REFLEX MICROSCOPIC
Bilirubin Urine: NEGATIVE
Glucose, UA: NEGATIVE mg/dL
Hgb urine dipstick: NEGATIVE
Ketones, ur: NEGATIVE mg/dL
Nitrite: NEGATIVE
Protein, ur: NEGATIVE mg/dL
Specific Gravity, Urine: 1.02 (ref 1.005–1.030)
Urobilinogen, UA: 0.2 mg/dL (ref 0.0–1.0)
pH: 6.5 (ref 5.0–8.0)

## 2011-01-25 LAB — COMPREHENSIVE METABOLIC PANEL
ALT: 13 U/L (ref 0–35)
AST: 13 U/L (ref 0–37)
Albumin: 3.5 g/dL (ref 3.5–5.2)
Alkaline Phosphatase: 83 U/L (ref 39–117)
BUN: 9 mg/dL (ref 6–23)
CO2: 26 mEq/L (ref 19–32)
Calcium: 8.9 mg/dL (ref 8.4–10.5)
Chloride: 102 mEq/L (ref 96–112)
Creatinine, Ser: 0.67 mg/dL (ref 0.4–1.2)
GFR calc Af Amer: >60 mL/min (ref >60)
GFR calc non Af Amer: >60 mL/min (ref >60)
Glucose, Bld: 78 mg/dL (ref 70–99)
Potassium: 3.8 mEq/L (ref 3.5–5.1)
Sodium: 136 mEq/L (ref 135–145)
Total Bilirubin: 0.3 mg/dL (ref 0.3–1.2)
Total Protein: 7.8 g/dL (ref 6.0–8.3)

## 2011-01-25 LAB — CBC
HCT: 36.9 % (ref 36.0–46.0)
Hemoglobin: 12.1 g/dL (ref 12.0–15.0)
MCH: 28.3 pg (ref 26.0–34.0)
MCHC: 32.8 g/dL (ref 30.0–36.0)
MCV: 86.2 fL (ref 78.0–100.0)
Platelets: 188 10*3/uL (ref 150–400)
RBC: 4.28 MIL/uL (ref 3.87–5.11)
RDW: 14.5 % (ref 11.5–15.5)
WBC: 7.2 10*3/uL (ref 4.0–10.5)

## 2011-01-25 LAB — WET PREP, GENITAL
Trich, Wet Prep: NONE SEEN
Yeast Wet Prep HPF POC: NONE SEEN

## 2011-01-25 LAB — HCG, QUANTITATIVE, PREGNANCY: hCG, Beta Chain, Quant, S: 1 m[IU]/mL (ref <5)

## 2011-01-25 LAB — POCT PREGNANCY, URINE: Preg Test, Ur: NEGATIVE

## 2011-01-25 LAB — HCG, SERUM, QUALITATIVE: Preg, Serum: NEGATIVE

## 2011-01-26 LAB — GC/CHLAMYDIA PROBE AMP, GENITAL
Chlamydia, DNA Probe: POSITIVE — AB
GC Probe Amp, Genital: NEGATIVE

## 2011-04-10 ENCOUNTER — Inpatient Hospital Stay (HOSPITAL_COMMUNITY): Payer: Self-pay

## 2011-04-10 ENCOUNTER — Encounter (HOSPITAL_COMMUNITY): Payer: Self-pay

## 2011-04-10 ENCOUNTER — Inpatient Hospital Stay (HOSPITAL_COMMUNITY)
Admission: AD | Admit: 2011-04-10 | Discharge: 2011-04-10 | Disposition: A | Discharge status: Home / Self Care | Payer: Self-pay | Source: Ambulatory Visit | Attending: Obstetrics & Gynecology | Admitting: Obstetrics & Gynecology

## 2011-04-10 DIAGNOSIS — R197 Diarrhea, unspecified: Secondary | ICD-10-CM | POA: Insufficient documentation

## 2011-04-10 DIAGNOSIS — R1031 Right lower quadrant pain: Secondary | ICD-10-CM | POA: Insufficient documentation

## 2011-04-10 DIAGNOSIS — L0293 Carbuncle, unspecified: Secondary | ICD-10-CM | POA: Insufficient documentation

## 2011-04-10 DIAGNOSIS — L02229 Furuncle of trunk, unspecified: Secondary | ICD-10-CM

## 2011-04-10 DIAGNOSIS — L0292 Furuncle, unspecified: Secondary | ICD-10-CM | POA: Insufficient documentation

## 2011-04-10 HISTORY — DX: Gestational (pregnancy-induced) hypertension without significant proteinuria, unspecified trimester: O13.9

## 2011-04-10 HISTORY — DX: Supraventricular tachycardia: I47.1

## 2011-04-10 HISTORY — DX: Supraventricular tachycardia, unspecified: I47.10

## 2011-04-10 HISTORY — DX: Chlamydial infection, unspecified: A74.9

## 2011-04-10 HISTORY — DX: Essential (primary) hypertension: I10

## 2011-04-10 HISTORY — DX: Cerebral infarction, unspecified: I63.9

## 2011-04-10 HISTORY — DX: Gonococcal infection, unspecified: A54.9

## 2011-04-10 HISTORY — DX: Rheumatoid arthritis, unspecified: M06.9

## 2011-04-10 LAB — CBC
HCT: 35.3 % — ABNORMAL LOW (ref 36.0–46.0)
Hemoglobin: 11.6 g/dL — ABNORMAL LOW (ref 12.0–15.0)
MCH: 28.4 pg (ref 26.0–34.0)
MCHC: 32.9 g/dL (ref 30.0–36.0)
MCV: 86.3 fL (ref 78.0–100.0)
Platelets: 189 10*3/uL (ref 150–400)
RBC: 4.09 MIL/uL (ref 3.87–5.11)
RDW: 14.4 % (ref 11.5–15.5)
WBC: 8.6 10*3/uL (ref 4.0–10.5)

## 2011-04-10 LAB — URINALYSIS, ROUTINE W REFLEX MICROSCOPIC
Bilirubin Urine: NEGATIVE
Glucose, UA: NEGATIVE mg/dL
Hgb urine dipstick: NEGATIVE
Ketones, ur: NEGATIVE mg/dL
Leukocytes, UA: NEGATIVE
Nitrite: NEGATIVE
Protein, ur: NEGATIVE mg/dL
Specific Gravity, Urine: 1.015 (ref 1.005–1.030)
Urobilinogen, UA: 0.2 mg/dL (ref 0.0–1.0)
pH: 6 (ref 5.0–8.0)

## 2011-04-10 LAB — WET PREP, GENITAL
Clue Cells Wet Prep HPF POC: NONE SEEN
Trich, Wet Prep: NONE SEEN
Yeast Wet Prep HPF POC: NONE SEEN

## 2011-04-10 LAB — POCT PREGNANCY, URINE: Preg Test, Ur: NEGATIVE

## 2011-04-10 MED ORDER — PROPRANOLOL HCL 10 MG PO TABS: 10.0000 mg | ORAL_TABLET | Freq: Two times a day (BID) | ORAL | Status: DC

## 2011-04-10 MED ORDER — OXYCODONE-ACETAMINOPHEN 5-325 MG PO TABS
2.0000 | ORAL_TABLET | ORAL | Status: DC | PRN
  Administered 2011-04-10: 2 via ORAL
  Filled 2011-04-10: qty 2

## 2011-04-10 MED ORDER — SULFAMETHOXAZOLE-TRIMETHOPRIM 800-160 MG PO TABS: 1.0000 | ORAL_TABLET | Freq: Two times a day (BID) | ORAL | Status: AC

## 2011-04-10 NOTE — ED Provider Notes (Signed)
History     Chief Complaint  Patient presents with  . Abscess   HPI Presents with c/o RLQ pain for several days as well as a "hair bump" on her mons which has increased in size and become more painful in the past few days. ALso c/o leg swelling and BP issues. Has an appointment with her new internal medicine doctor next week. Also c/o UTI symptoms.    Past Medical History  Diagnosis Date  . Diabetes mellitus     A2DM during pregnancy. now on metformin.   [name] Hypertension   . Stroke 2012  . SVT (supraventricular tachycardia)     on propanalol  . Gonorrhea 2010    treated  . Chlamydia 2012    treated  . Pregnancy induced hypertension     pre E with pregnancies  . Asthma   . Rheumatoid arthritis     Past Surgical History  Procedure Date  . Tubal ligation 2009  . Finger surgery 1993    left ring finger to correct bone "problem"    Family History  Problem Relation Age of Onset  . Anesthesia problems Mother     difficult to arouse, SOB    History  Substance Use Topics  . Smoking status: Current Everyday Smoker -- 0.2 packs/day for 4 years    Types: Cigarettes  . Smokeless tobacco: Not on file  . Alcohol Use: Yes     socially    Allergies:  Allergies  Allergen Reactions  . Amoxicillin Rash    Facial swelling    Prescriptions prior to admission  Medication Sig Dispense Refill  . acetaminophen (TYLENOL) 500 MG tablet Take 500 mg by mouth every 6 (six) hours as needed. Headache        . aspirin 81 MG EC tablet Take 81 mg by mouth daily.        [name] lisinopril (PRINIVIL,ZESTRIL) 10 MG tablet Take 10 mg by mouth daily.        . metFORMIN (GLUCOPHAGE) 500 MG tablet Take 500 mg by mouth 2 (two) times daily with a meal.        . Multiple Vitamins-Minerals (MULTIVITAMIN WITH MINERALS) tablet Take 1 tablet by mouth daily.        . propranolol (INDERAL) 10 MG tablet Take 10 mg by mouth 2 (two) times daily.        . simvastatin (ZOCOR) 10 MG tablet Take 10 mg by mouth at  bedtime.          Review of Systems  Constitutional: Negative for fever.  Gastrointestinal: Positive for nausea and abdominal pain (RLQ pain). Negative for vomiting.  Genitourinary: Positive for dysuria.  Skin:       Hair bump on mons pubis, increasing in size    Physical Exam   Blood pressure 144/93, pulse 82, temperature 98.2 F (36.8 C), temperature source Oral, resp. rate 20, height 5\' 7"  (1.702 m), weight 161.934 kg (357 lb), last menstrual period 04/10/2011.  Physical Exam  Constitutional: She is oriented to person, place, and time. She appears well-developed and well-nourished.       Morbidly obese  HENT:  Head: Normocephalic.  Cardiovascular: Normal rate.   Respiratory: Effort normal.  GI: Soft.  Genitourinary: Uterus normal. Vaginal discharge (Small amount dark red blood, no lesions.  Uterus small nontender, No CMT, R adnexal tenderness , none on left) found.  Musculoskeletal: Normal range of motion.  Neurological: She is alert and oriented to person, place, and time.  Skin:  Skin is warm and dry.  Psychiatric: She has a normal mood and affect.  U/S reviewed:  Normal with no cysts or abnormalities  MAU Course  Procedures  MDM   Assessment and Plan  RLQ pain, normal WBC, normal U/S.  Probably gastroenteritis (has had diarrhea today) Furuncle on Mons Pubis, not ready for I&D Normal Urinalysis, no UTI Desires Rx for Propranolol until her MD appt.  Wynelle Bourgeois 04/10/2011, 1:27 PM

## 2011-04-10 NOTE — Progress Notes (Signed)
Hair bump in pubic hair noted a few days ago, has gotten larger, causing the pain in the lower abd esp on the right side.  Also having pain with urination past 3-4 days , hx of UTI's.  States period just started when getting sample, last period was in June.
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
## 2011-04-10 NOTE — ED Notes (Signed)
U/S called for patient. Maria Carpenter, CNM then went in to evaluate patient and discuss sxs and concerns. Patient was delayed going to U/S. U/S dept. was notified.

## 2011-04-11 LAB — GC/CHLAMYDIA PROBE AMP, GENITAL
Chlamydia, DNA Probe: NEGATIVE
GC Probe Amp, Genital: NEGATIVE

## 2011-04-24 ENCOUNTER — Emergency Department (HOSPITAL_COMMUNITY): Payer: Self-pay

## 2011-04-24 ENCOUNTER — Emergency Department (HOSPITAL_COMMUNITY)
Admission: EM | Admit: 2011-04-24 | Discharge: 2011-04-24 | Disposition: A | Discharge status: Home / Self Care | Payer: Self-pay | Attending: Emergency Medicine | Admitting: Emergency Medicine

## 2011-04-24 DIAGNOSIS — F411 Generalized anxiety disorder: Secondary | ICD-10-CM | POA: Insufficient documentation

## 2011-04-24 DIAGNOSIS — R10819 Abdominal tenderness, unspecified site: Secondary | ICD-10-CM | POA: Insufficient documentation

## 2011-04-24 DIAGNOSIS — Z79899 Other long term (current) drug therapy: Secondary | ICD-10-CM | POA: Insufficient documentation

## 2011-04-24 DIAGNOSIS — R5381 Other malaise: Secondary | ICD-10-CM | POA: Insufficient documentation

## 2011-04-24 DIAGNOSIS — J45909 Unspecified asthma, uncomplicated: Secondary | ICD-10-CM | POA: Insufficient documentation

## 2011-04-24 DIAGNOSIS — K8020 Calculus of gallbladder without cholecystitis without obstruction: Secondary | ICD-10-CM | POA: Insufficient documentation

## 2011-04-24 DIAGNOSIS — Z8619 Personal history of other infectious and parasitic diseases: Secondary | ICD-10-CM | POA: Insufficient documentation

## 2011-04-24 DIAGNOSIS — R630 Anorexia: Secondary | ICD-10-CM | POA: Insufficient documentation

## 2011-04-24 DIAGNOSIS — E119 Type 2 diabetes mellitus without complications: Secondary | ICD-10-CM | POA: Insufficient documentation

## 2011-04-24 DIAGNOSIS — K219 Gastro-esophageal reflux disease without esophagitis: Secondary | ICD-10-CM | POA: Insufficient documentation

## 2011-04-24 DIAGNOSIS — R209 Unspecified disturbances of skin sensation: Secondary | ICD-10-CM | POA: Insufficient documentation

## 2011-04-24 DIAGNOSIS — F329 Major depressive disorder, single episode, unspecified: Secondary | ICD-10-CM | POA: Insufficient documentation

## 2011-04-24 DIAGNOSIS — R112 Nausea with vomiting, unspecified: Secondary | ICD-10-CM | POA: Insufficient documentation

## 2011-04-24 DIAGNOSIS — R42 Dizziness and giddiness: Secondary | ICD-10-CM | POA: Insufficient documentation

## 2011-04-24 DIAGNOSIS — R109 Unspecified abdominal pain: Secondary | ICD-10-CM | POA: Insufficient documentation

## 2011-04-24 DIAGNOSIS — R5383 Other fatigue: Secondary | ICD-10-CM | POA: Insufficient documentation

## 2011-04-24 DIAGNOSIS — R51 Headache: Secondary | ICD-10-CM | POA: Insufficient documentation

## 2011-04-24 DIAGNOSIS — F3289 Other specified depressive episodes: Secondary | ICD-10-CM | POA: Insufficient documentation

## 2011-04-24 DIAGNOSIS — Z8673 Personal history of transient ischemic attack (TIA), and cerebral infarction without residual deficits: Secondary | ICD-10-CM | POA: Insufficient documentation

## 2011-04-24 DIAGNOSIS — I10 Essential (primary) hypertension: Secondary | ICD-10-CM | POA: Insufficient documentation

## 2011-04-24 LAB — COMPREHENSIVE METABOLIC PANEL
ALT: 13 U/L (ref 0–35)
AST: 17 U/L (ref 0–37)
Albumin: 3.7 g/dL (ref 3.5–5.2)
Alkaline Phosphatase: 87 U/L (ref 39–117)
BUN: 12 mg/dL (ref 6–23)
CO2: 29 mEq/L (ref 19–32)
Calcium: 9.4 mg/dL (ref 8.4–10.5)
Chloride: 104 mEq/L (ref 96–112)
Creatinine, Ser: 0.61 mg/dL (ref 0.50–1.10)
GFR calc Af Amer: >60 mL/min (ref >60)
GFR calc non Af Amer: >60 mL/min (ref >60)
Glucose, Bld: 103 mg/dL — ABNORMAL HIGH (ref 70–99)
Potassium: 4.4 mEq/L (ref 3.5–5.1)
Sodium: 139 mEq/L (ref 135–145)
Total Bilirubin: 0.4 mg/dL (ref 0.3–1.2)
Total Protein: 7.9 g/dL (ref 6.0–8.3)

## 2011-04-24 LAB — URINE MICROSCOPIC-ADD ON

## 2011-04-24 LAB — DIFFERENTIAL
Basophils Absolute: 0 10*3/uL (ref 0.0–0.1)
Basophils Relative: 0 % (ref 0–1)
Eosinophils Absolute: 0.1 10*3/uL (ref 0.0–0.7)
Eosinophils Relative: 2 % (ref 0–5)
Lymphocytes Relative: 41 % (ref 12–46)
Lymphs Abs: 2.7 10*3/uL (ref 0.7–4.0)
Monocytes Absolute: 0.4 10*3/uL (ref 0.1–1.0)
Monocytes Relative: 6 % (ref 3–12)
Neutro Abs: 3.3 10*3/uL (ref 1.7–7.7)
Neutrophils Relative %: 51 % (ref 43–77)

## 2011-04-24 LAB — URINALYSIS, ROUTINE W REFLEX MICROSCOPIC
Bilirubin Urine: NEGATIVE
Glucose, UA: NEGATIVE mg/dL
Hgb urine dipstick: NEGATIVE
Ketones, ur: NEGATIVE mg/dL
Nitrite: NEGATIVE
Protein, ur: NEGATIVE mg/dL
Specific Gravity, Urine: 1.02 (ref 1.005–1.030)
Urobilinogen, UA: 1 mg/dL (ref 0.0–1.0)
pH: 7 (ref 5.0–8.0)

## 2011-04-24 LAB — CBC
HCT: 36.8 % (ref 36.0–46.0)
Hemoglobin: 12.6 g/dL (ref 12.0–15.0)
MCH: 28.9 pg (ref 26.0–34.0)
MCHC: 34.2 g/dL (ref 30.0–36.0)
MCV: 84.4 fL (ref 78.0–100.0)
Platelets: 237 10*3/uL (ref 150–400)
RBC: 4.36 MIL/uL (ref 3.87–5.11)
RDW: 14.3 % (ref 11.5–15.5)
WBC: 6.6 10*3/uL (ref 4.0–10.5)

## 2011-04-24 LAB — POCT PREGNANCY, URINE: Preg Test, Ur: NEGATIVE

## 2011-04-24 LAB — LIPASE, BLOOD: Lipase: 29 U/L (ref 11–59)

## 2011-04-25 ENCOUNTER — Emergency Department (HOSPITAL_COMMUNITY)
Admission: EM | Admit: 2011-04-25 | Discharge: 2011-04-25 | Discharge status: Against Medical Advice | Payer: Self-pay | Attending: Emergency Medicine | Admitting: Emergency Medicine

## 2011-04-25 DIAGNOSIS — R109 Unspecified abdominal pain: Secondary | ICD-10-CM | POA: Insufficient documentation

## 2011-04-25 DIAGNOSIS — R1110 Vomiting, unspecified: Secondary | ICD-10-CM | POA: Insufficient documentation

## 2011-04-25 LAB — GLUCOSE, CAPILLARY: Glucose-Capillary: 108 mg/dL — ABNORMAL HIGH (ref 70–99)

## 2011-05-01 NOTE — Consult Note (Signed)
NAME:  Maria Carpenter, Maria Carpenter NO.:  [id number]  MEDICAL RECORD NO.:  [id number]  LOCATION:  [facility]                         FACILITY:  [facility]  PHYSICIAN:  Levie Heritage, MD       DATE OF BIRTH:  01/09/1981  DATE OF CONSULTATION:  04/24/2011 DATE OF DISCHARGE:                                CONSULTATION   REASON FOR CONSULTATION:  Code stroke in the setting of right upper quadrant pain, bilateral hand numbness and confusion, after taking breakfast.  HISTORY OF PRESENT ILLNESS:  This is a 30 year old obese female with known GERD, gallstones, hypertension, asthma, obesity, DVT and ovarian cyst.  The patient states that she woke up this morning at baseline after eating breakfast she noted right upper quadrant discomfort along with left pain in her hip along with bilateral hand tingling after she became nervous secondary to the pain.  Due to this nervousness, the patient came into the emergency room thinking she possibly had a TIA. While in the emergency room, a Code Stroke was called.  The patient was brought back to the CT scanner which showed no acute intracranial abnormalities.  At time of consultation in the emergency room, the patient was continued to complain of right upper quadrant discomfort and left hip pain and mild bilateral hand numbness which was dissipating. In addition, the patient was also very emotional and started to weep when asked where she hurt.  PAST MEDICAL HISTORY:  As noted above.  MEDICATIONS:  She is on albuterol, hydrochloride, propranolol, aspirin, nicotine patch, simvastatin.  ALLERGIES:  AMOXICILLIN.  SOCIAL HISTORY:  The patient does not smoke, drink or do illicit drugs.  REVIEW OF SYSTEMS:  Negative.  All 10 systems are negative with exception above.  PHYSICAL EXAMINATION:  VITAL SIGNS:  Blood pressure is 130/90, pulse 88, respirations 16, temperature 98.6. NEUROLOGIC:  Pupils equal, round, react to light and  accommodating, conjugate.  Extraocular movements are intact.  Visual fields grossly intact.  Face symmetrical.  Tongue is midline.  Uvula is midline.  The patient is very labile and emotional at this point in time.  The patient shows no dysarthria, no aphasia, no slurred speech.  No facial droop. Facial sensation is full to pinprick, light touch throughout.  Shoulder shrug and head turn is within normal limits.  Coordination, finger-to- nose and heel-to-shin are smooth.  The patient's motor shows 5/5 strength throughout.  However, she does show significant give-way strength in her bilateral upper and lower extremities.  Deep tendon reflexes were 2+ throughout with downgoing toes bilaterally.  The patient shows no drift in the upper or lower extremities.  Sensation is full to pinprick, light touch throughout.  LABS:  CMP, CBC are pending at this time.  IMAGING:  CT of head without contrast shows no intracranial abnormalities.  No masses and no bleeding.  ASSESSMENT:  This is a 30 year old female who presented with abdominal pain and nausea, vomiting in the setting of known gallbladder stones and migraine headache.  These symptoms do not represent CNS ischemic event.   RECOMMENDATIONS: 1. At this time, would recommend continuing to further workup the     patient's gallstones and other medical abnormalities that could  produce right upper quadrant pain, nausea, vomiting secondary to     eating a meal. 2. No neuro followup will be initiated.  Neurology will sign off this.  CODE STROKE LOG:  Stroke arrival at 10:17, stroke team arrival at 10:36, neurological exam by Neurology and stroke team was at 10:36, last seen normal was at 10:10, code stroke was cancelled at 10:45, IV t-PA was not given as no symptoms of stroke.  Dr. Hoy Morn seen and evaluated the patient and recommend no further neurological workup at this time.     Felicie Morn, PA-C  I have seen the patient and agree  with above clinical findings. ______________________________ Levie Heritage, MD    DS/MEDQ  D:  04/24/2011  T:  04/24/2011  Job:  [id number]  Electronically Signed by Felicie Morn PA-C on 04/26/2011 02:31:00 PM Electronically Signed by Levie Heritage MD on 05/01/2011 08:17:59 AM

## 2011-05-13 ENCOUNTER — Inpatient Hospital Stay (HOSPITAL_COMMUNITY)
Admission: AD | Admit: 2011-05-13 | Discharge: 2011-05-13 | Disposition: A | Discharge status: Home / Self Care | Payer: Self-pay | Source: Ambulatory Visit | Attending: Family Medicine | Admitting: Family Medicine

## 2011-05-13 ENCOUNTER — Encounter (HOSPITAL_COMMUNITY): Payer: Self-pay | Admitting: Obstetrics and Gynecology

## 2011-05-13 DIAGNOSIS — N925 Other specified irregular menstruation: Secondary | ICD-10-CM | POA: Insufficient documentation

## 2011-05-13 DIAGNOSIS — N939 Abnormal uterine and vaginal bleeding, unspecified: Secondary | ICD-10-CM

## 2011-05-13 DIAGNOSIS — R109 Unspecified abdominal pain: Secondary | ICD-10-CM | POA: Insufficient documentation

## 2011-05-13 DIAGNOSIS — N949 Unspecified condition associated with female genital organs and menstrual cycle: Secondary | ICD-10-CM | POA: Insufficient documentation

## 2011-05-13 DIAGNOSIS — N938 Other specified abnormal uterine and vaginal bleeding: Secondary | ICD-10-CM | POA: Insufficient documentation

## 2011-05-13 DIAGNOSIS — R079 Chest pain, unspecified: Secondary | ICD-10-CM | POA: Insufficient documentation

## 2011-05-13 DIAGNOSIS — R0602 Shortness of breath: Secondary | ICD-10-CM | POA: Insufficient documentation

## 2011-05-13 LAB — ABO/RH: ABO/RH(D): O NEG

## 2011-05-13 LAB — URINALYSIS, ROUTINE W REFLEX MICROSCOPIC
Bilirubin Urine: NEGATIVE
Glucose, UA: NEGATIVE mg/dL
Ketones, ur: NEGATIVE mg/dL
Leukocytes, UA: NEGATIVE
Nitrite: NEGATIVE
Protein, ur: NEGATIVE mg/dL
Specific Gravity, Urine: 1.025 (ref 1.005–1.030)
Urobilinogen, UA: 0.2 mg/dL (ref 0.0–1.0)
pH: 7 (ref 5.0–8.0)

## 2011-05-13 LAB — CBC
HCT: 35.3 % — ABNORMAL LOW (ref 36.0–46.0)
Hemoglobin: 11.5 g/dL — ABNORMAL LOW (ref 12.0–15.0)
MCH: 28.2 pg (ref 26.0–34.0)
MCHC: 32.6 g/dL (ref 30.0–36.0)
MCV: 86.5 fL (ref 78.0–100.0)
Platelets: 198 10*3/uL (ref 150–400)
RBC: 4.08 MIL/uL (ref 3.87–5.11)
RDW: 15 % (ref 11.5–15.5)
WBC: 6.8 10*3/uL (ref 4.0–10.5)

## 2011-05-13 LAB — COMPREHENSIVE METABOLIC PANEL
ALT: 13 U/L (ref 0–35)
AST: 13 U/L (ref 0–37)
Albumin: 3.5 g/dL (ref 3.5–5.2)
Alkaline Phosphatase: 81 U/L (ref 39–117)
BUN: 11 mg/dL (ref 6–23)
CO2: 28 mEq/L (ref 19–32)
Calcium: 9.2 mg/dL (ref 8.4–10.5)
Chloride: 103 mEq/L (ref 96–112)
Creatinine, Ser: 0.53 mg/dL (ref 0.50–1.10)
GFR calc Af Amer: >60 mL/min (ref >60)
GFR calc non Af Amer: >60 mL/min (ref >60)
Glucose, Bld: 87 mg/dL (ref 70–99)
Potassium: 4 mEq/L (ref 3.5–5.1)
Sodium: 137 mEq/L (ref 135–145)
Total Bilirubin: 0.1 mg/dL — ABNORMAL LOW (ref 0.3–1.2)
Total Protein: 7.2 g/dL (ref 6.0–8.3)

## 2011-05-13 LAB — WET PREP, GENITAL
Clue Cells Wet Prep HPF POC: NONE SEEN
Trich, Wet Prep: NONE SEEN
Yeast Wet Prep HPF POC: NONE SEEN

## 2011-05-13 LAB — URINE MICROSCOPIC-ADD ON

## 2011-05-13 LAB — LIPASE, BLOOD: Lipase: 26 U/L (ref 11–59)

## 2011-05-13 LAB — AMYLASE: Amylase: 67 U/L (ref 0–105)

## 2011-05-13 LAB — POCT PREGNANCY, URINE: Preg Test, Ur: NEGATIVE

## 2011-05-13 MED ORDER — KETOROLAC TROMETHAMINE 60 MG/2ML IM SOLN
60.0000 mg | Freq: Once | INTRAMUSCULAR | Status: AC
  Administered 2011-05-13: 60 mg via INTRAMUSCULAR
  Filled 2011-05-13: qty 2

## 2011-05-13 MED ORDER — ONDANSETRON 8 MG PO TBDP
8.0000 mg | ORAL_TABLET | Freq: Once | ORAL | Status: AC
  Administered 2011-05-13: 8 mg via ORAL
  Filled 2011-05-13: qty 1

## 2011-05-13 MED ORDER — KETOROLAC TROMETHAMINE 10 MG PO TABS: 10.0000 mg | ORAL_TABLET | Freq: Four times a day (QID) | ORAL | Status: AC | PRN

## 2011-05-13 MED ORDER — ONDANSETRON 8 MG PO TBDP: 8.0000 mg | ORAL_TABLET | Freq: Three times a day (TID) | ORAL | Status: AC | PRN

## 2011-05-13 NOTE — ED Provider Notes (Signed)
Chart reviewed and agree with management and plan.  

## 2011-05-13 NOTE — ED Provider Notes (Signed)
History     Chief Complaint  Patient presents with  . Vaginal Bleeding  . Abdominal Pain  . Shortness of Breath  . Chest Pain  . Fatigue   Vaginal Bleeding The patient's primary symptoms include pelvic pain and vaginal bleeding. This is a new problem. The current episode started in the past 7 days. The problem has been unchanged. The pain is moderate. The problem affects the right side. She is not pregnant. Associated symptoms include abdominal pain, anorexia, back pain, chills, diarrhea, a fever, frequency, nausea and vomiting. Pertinent negatives include no sore throat. The vaginal bleeding is heavier than menses. She has not been passing clots. She has not been passing tissue. The treatment provided mild relief. She is sexually active. No, her partner does not have an STD. She uses tubal ligation for contraception. Her menstrual history has been irregular. Her past medical history is significant for metrorrhagia, ovarian cysts and an STD.  Abdominal Pain This is a new problem. The current episode started in the past 7 days. The onset quality is sudden. The problem occurs constantly. The most recent episode lasted 2 days. The problem has been unchanged. The pain is located in the RLQ. The pain is moderate. The quality of the pain is cramping and sharp. Pain radiation: Right leg. Associated symptoms include anorexia, diarrhea, a fever, frequency, nausea and vomiting. The pain is aggravated by nothing. The pain is relieved by nothing. She has tried oral narcotic analgesics for the symptoms. The treatment provided mild relief. Her past medical history is significant for gallstones.  Shortness of Breath Associated symptoms include abdominal pain, chest pain, a fever and vomiting. Pertinent negatives include no leg pain, leg swelling, rhinorrhea, sore throat, sputum production, swollen glands, syncope or wheezing. The symptoms are aggravated by any activity. Risk factors include smoking (Hx DVT Left  leg). She has tried rest for the symptoms. The treatment provided mild relief. Her past medical history is significant for asthma and DVT.  Chest Pain  Associated symptoms include abdominal pain, back pain, a fever, nausea, shortness of breath and vomiting. Pertinent negatives include no leg pain, sputum production or syncope.  Her past medical history is significant for DVT.  Patient is a 30 y.o. female presenting with vaginal bleeding, abdominal pain, shortness of breath, and chest pain.  Vaginal Bleeding This is a new problem. The current episode started in the past 7 days. The problem has been unchanged. Associated symptoms include abdominal pain, anorexia, chest pain, chills, a fever, nausea and vomiting. Pertinent negatives include no sore throat or swollen glands. The treatment provided mild relief.  Abdominal Pain The primary symptoms of the illness include abdominal pain, fever, shortness of breath, nausea, vomiting, diarrhea and vaginal bleeding. The current episode started in the past 7 days. The onset of the illness was sudden. The problem has been unchanged.  Pain radiation: Right leg.  The patient's medical history is significant for asthma.  Additional symptoms associated with the illness include chills, anorexia, frequency and back pain. Significant associated medical issues include gallstones.  Shortness of Breath  The symptoms are aggravated by any activity. Associated symptoms include chest pain, a fever and shortness of breath. Pertinent negatives include no rhinorrhea, no sore throat and no wheezing. Her past medical history is significant for asthma.  Chest Pain Primary symptoms include a fever, shortness of breath, abdominal pain, nausea and vomiting. Pertinent negatives for primary symptoms include no syncope and no wheezing.  The patient's medical history is significant for  asthma.  Her past medical history is significant for DVT.     Pertinent Gynecological  History: Menses: flow is moderate and irregular occurring approximately every 30 days without intermenstrual spotting Bleeding: Heavy this cycle Contraception: tubal ligation Blood transfusions: unknown Sexually transmitted diseases: past history: GC/CT Previous GYN Procedures: None.  Last mammogram: NA Last pap: abnormal: Unknown year "it has been a while". F/U normal   Past Medical History  Diagnosis Date  . Diabetes mellitus     A2DM during pregnancy. now on metformin.   [name] Hypertension   . Stroke 2012  . SVT (supraventricular tachycardia)     on propanalol  . Gonorrhea 2010    treated  . Chlamydia 2012    treated  . Pregnancy induced hypertension     pre E with pregnancies  . Asthma   . Rheumatoid arthritis     Past Surgical History  Procedure Date  . Tubal ligation 2009  . Finger surgery 1993    left ring finger to correct bone "problem"    Family History  Problem Relation Age of Onset  . Anesthesia problems Mother     difficult to arouse, SOB    History  Substance Use Topics  . Smoking status: Current Everyday Smoker -- 0.2 packs/day for 4 years    Types: Cigarettes  . Smokeless tobacco: Not on file  . Alcohol Use: Yes     socially    Allergies:  Allergies  Allergen Reactions  . Amoxicillin Anaphylaxis and Rash    Facial swelling    Prescriptions prior to admission  Medication Sig Dispense Refill  . acetaminophen (TYLENOL) 500 MG tablet Take 500 mg by mouth every 6 (six) hours as needed. Headache        . aspirin 81 MG EC tablet Take 81 mg by mouth daily.        . famotidine (PEPCID) 20 MG tablet Take 20 mg by mouth 2 (two) times daily.        [name] HYDROcodone-acetaminophen (NORCO) 5-325 MG per tablet Take 1 tablet by mouth every 6 (six) hours as needed. For pain       . lisinopril (PRINIVIL,ZESTRIL) 10 MG tablet Take 10 mg by mouth daily.        . metFORMIN (GLUCOPHAGE) 500 MG tablet Take 500 mg by mouth 2 (two) times daily with a meal.        .  Multiple Vitamins-Minerals (MULTIVITAMIN WITH MINERALS) tablet Take 1 tablet by mouth daily.        . propranolol (INDERAL) 10 MG tablet Take 1 tablet (10 mg total) by mouth 2 (two) times daily.  40 tablet  0  . simvastatin (ZOCOR) 10 MG tablet Take 10 mg by mouth at bedtime.          Review of Systems  Constitutional: Positive for fever and chills.  HENT: Negative for sore throat and rhinorrhea.   Respiratory: Positive for shortness of breath. Negative for sputum production and wheezing.   Cardiovascular: Positive for chest pain. Negative for leg swelling and syncope.  Gastrointestinal: Positive for nausea, vomiting, abdominal pain, diarrhea and anorexia.  Genitourinary: Positive for frequency, vaginal bleeding and pelvic pain.  Musculoskeletal: Positive for back pain.   Physical Exam   Blood pressure 151/99, pulse 93, temperature 98.4 F (36.9 C), temperature source Oral, height 5\' 7"  (1.702 m), weight 160.573 kg (354 lb), last menstrual period 05/11/2011.  Physical Exam  Constitutional: She is oriented to person, place, and time. She appears  well-developed and well-nourished. No distress.  HENT:  Head: Normocephalic.  Cardiovascular: Normal rate.   Respiratory: Effort normal.  GI: Soft. Bowel sounds are normal. She exhibits no distension and no mass. There is tenderness (LLQ). There is no rebound, no guarding, no CVA tenderness and no tenderness at McBurney's point.       Obese  Genitourinary: Uterus is not tender. Cervix exhibits no motion tenderness, no discharge and no friability. Right adnexum displays no mass, no tenderness and no fullness. Left adnexum displays tenderness. Left adnexum displays no mass and no fullness. There is bleeding (mod BRB) around the vagina. No tenderness around the vagina.       Exam limited by body habitus.  Neurological: She is alert and oriented to person, place, and time.  Skin: Skin is warm and dry. She is not diaphoretic.  Psychiatric: She has a  normal mood and affect.  1726  Lungs - clear bilaterally; Heart regular rate and rhythm, no murmur  MAU Course  Procedures Results for orders placed during the hospital encounter of 05/13/11 (from the past 24 hour(s))  URINALYSIS, ROUTINE W REFLEX MICROSCOPIC     Status: Abnormal   Collection Time   05/13/11  2:40 PM      Component Value Range   Color, Urine YELLOW  YELLOW    Appearance CLOUDY (*) CLEAR    Specific Gravity, Urine 1.025  1.005 - 1.030    pH 7.0  5.0 - 8.0    Glucose, UA NEGATIVE  NEGATIVE (mg/dL)   Hgb urine dipstick LARGE (*) NEGATIVE    Bilirubin Urine NEGATIVE  NEGATIVE    Ketones, ur NEGATIVE  NEGATIVE (mg/dL)   Protein, ur NEGATIVE  NEGATIVE (mg/dL)   Urobilinogen, UA 0.2  0.0 - 1.0 (mg/dL)   Nitrite NEGATIVE  NEGATIVE    Leukocytes, UA NEGATIVE  NEGATIVE   URINE MICROSCOPIC-ADD ON     Status: Normal   Collection Time   05/13/11  2:40 PM      Component Value Range   Squamous Epithelial / LPF RARE  RARE    RBC / HPF TOO NUMEROUS TO COUNT  <3 (RBC/hpf)  POCT PREGNANCY, URINE     Status: Normal   Collection Time   05/13/11  2:52 PM      Component Value Range   Preg Test, Ur NEGATIVE    WET PREP, GENITAL     Status: Abnormal   Collection Time   05/13/11  3:25 PM      Component Value Range   Yeast, Wet Prep NONE SEEN  NONE SEEN    Trich, Wet Prep NONE SEEN  NONE SEEN    Clue Cells, Wet Prep NONE SEEN  NONE SEEN    WBC, Wet Prep HPF POC FEW (*) NONE SEEN   CBC     Status: Abnormal   Collection Time   05/13/11  3:40 PM      Component Value Range   WBC 6.8  4.0 - 10.5 (K/uL)   RBC 4.08  3.87 - 5.11 (MIL/uL)   Hemoglobin 11.5 (*) 12.0 - 15.0 (g/dL)   HCT 16.1 (*) 09.6 - 46.0 (%)   MCV 86.5  78.0 - 100.0 (fL)   MCH 28.2  26.0 - 34.0 (pg)   MCHC 32.6  30.0 - 36.0 (g/dL)   RDW 04.5  40.9 - 81.1 (%)   Platelets 198  150 - 400 (K/uL)  COMPREHENSIVE METABOLIC PANEL     Status: Abnormal   Collection Time  05/13/11  3:40 PM      Component Value Range   Sodium  137  135 - 145 (mEq/L)   Potassium 4.0  3.5 - 5.1 (mEq/L)   Chloride 103  96 - 112 (mEq/L)   CO2 28  19 - 32 (mEq/L)   Glucose, Bld 87  70 - 99 (mg/dL)   BUN 11  6 - 23 (mg/dL)   Creatinine, Ser 8.65  0.50 - 1.10 (mg/dL)   Calcium 9.2  8.4 - 78.4 (mg/dL)   Total Protein 7.2  6.0 - 8.3 (g/dL)   Albumin 3.5  3.5 - 5.2 (g/dL)   AST 13  0 - 37 (U/L)   ALT 13  0 - 35 (U/L)   Alkaline Phosphatase 81  39 - 117 (U/L)   Total Bilirubin 0.1 (*) 0.3 - 1.2 (mg/dL)   GFR calc non Af Amer >60  >60 (mL/min)   GFR calc Af Amer >60  >60 (mL/min)  ABO/RH     Status: Normal   Collection Time   05/13/11  3:40 PM      Component Value Range   ABO/RH(D) O NEG     Lipase and amalyse pending at time of discharge.  1650 Reevaluation  - nausea resolved with Zofran.  Back pain gone and lower abdominal pain lessened with Toradol.  Client had been taking Vicodin at home for the pain and it only relieved pain for 30 minutes.  Discussed options for pain management at home.  Will prescribe Toradol for 5 days and then client to begin Ibuprofen that she has at home if needed for pain.  Has an appointment on Tuesday for eligibility at [facility].   1726  Pain continues to improve.  Client now sitting in bed and has had PO fluids without nausea.  Agreeable with discharge.  Assessment and Plan  1600  Report given to Nolene Bernheim.  Assessment: Irregular vaginal bleeding.  Plan: Currently bleeding has lessened. Return if bleeding is severe. Will prescribe Toradol PO and Zofran ODT Keep appointment on Tuesday to be established with a medical provider. Get your prescriptions filled and take as needed.  Dorathy Kinsman 05/13/2011, 3:44 PM    Nolene Bernheim, NP 05/13/11 1731

## 2011-05-13 NOTE — Progress Notes (Signed)
Had period 8/27 heavy bleeding, started bleeding 05/11/11 heavy with cramping, fatigue, bloated feeling, loosing hair, nausea and vomiting, shortness of breath, chest tightness.

## 2011-05-14 LAB — GC/CHLAMYDIA PROBE AMP, GENITAL
Chlamydia, DNA Probe: NEGATIVE
GC Probe Amp, Genital: NEGATIVE

## 2011-05-17 LAB — POCT URINALYSIS DIP (DEVICE)
Bilirubin Urine: NEGATIVE
Bilirubin Urine: NEGATIVE
Bilirubin Urine: NEGATIVE
Bilirubin Urine: NEGATIVE
Glucose, UA: NEGATIVE
Glucose, UA: NEGATIVE
Glucose, UA: NEGATIVE
Glucose, UA: NEGATIVE
Glucose, UA: NEGATIVE
Hgb urine dipstick: NEGATIVE
Hgb urine dipstick: NEGATIVE
Hgb urine dipstick: NEGATIVE
Hgb urine dipstick: NEGATIVE
Hgb urine dipstick: NEGATIVE
Ketones, ur: NEGATIVE
Ketones, ur: 15 — AB
Ketones, ur: NEGATIVE
Ketones, ur: NEGATIVE
Nitrite: NEGATIVE
Nitrite: NEGATIVE
Nitrite: NEGATIVE
Nitrite: NEGATIVE
Nitrite: NEGATIVE
Operator id: 194561
Operator id: 297281
Operator id: 148111
Operator id: 297281
Operator id: 297281
Protein, ur: NEGATIVE
Protein, ur: NEGATIVE
Protein, ur: 30 — AB
Protein, ur: 30 — AB
Protein, ur: NEGATIVE
Specific Gravity, Urine: 1.015
Specific Gravity, Urine: 1.02
Specific Gravity, Urine: 1.01
Specific Gravity, Urine: 1.02
Specific Gravity, Urine: 1.02
Urobilinogen, UA: 0.2
Urobilinogen, UA: 0.2
Urobilinogen, UA: 0.2
Urobilinogen, UA: 1
Urobilinogen, UA: 1
pH: 7
pH: 7
pH: 7
pH: 7
pH: 7

## 2011-05-17 LAB — COMPREHENSIVE METABOLIC PANEL
ALT: 9
ALT: 13
ALT: 14
ALT: 14
AST: 11
AST: 13
AST: 14
AST: 15
Albumin: 2.6 — ABNORMAL LOW
Albumin: 2.4 — ABNORMAL LOW
Albumin: 2.6 — ABNORMAL LOW
Albumin: 2.7 — ABNORMAL LOW
Alkaline Phosphatase: 93
Alkaline Phosphatase: 112
Alkaline Phosphatase: 112
Alkaline Phosphatase: 124 — ABNORMAL HIGH
BUN: 5 — ABNORMAL LOW
BUN: 5 — ABNORMAL LOW
BUN: 5 — ABNORMAL LOW
BUN: 6
CO2: 23
CO2: 24
CO2: 25
CO2: 25
Calcium: 8.8
Calcium: 8.7
Calcium: 8.9
Calcium: 9
Chloride: 107
Chloride: 104
Chloride: 104
Chloride: 104
Creatinine, Ser: 0.52
Creatinine, Ser: 0.39 — ABNORMAL LOW
Creatinine, Ser: 0.53
Creatinine, Ser: 0.55
GFR calc Af Amer: >60
GFR calc Af Amer: >60
GFR calc Af Amer: >60
GFR calc Af Amer: >60
GFR calc non Af Amer: >60
GFR calc non Af Amer: >60
GFR calc non Af Amer: >60
GFR calc non Af Amer: >60
Glucose, Bld: 117 — ABNORMAL HIGH
Glucose, Bld: 155 — ABNORMAL HIGH
Glucose, Bld: 75
Glucose, Bld: 91
Potassium: 3.8
Potassium: 3.6
Potassium: 3.8
Potassium: 3.9
Sodium: 135
Sodium: 135
Sodium: 135
Sodium: 135
Total Bilirubin: 0.3
Total Bilirubin: 0.4
Total Bilirubin: 0.6
Total Bilirubin: 0.7
Total Protein: 6.3
Total Protein: 5.7 — ABNORMAL LOW
Total Protein: 6.1
Total Protein: 6.4

## 2011-05-17 LAB — URINALYSIS, ROUTINE W REFLEX MICROSCOPIC
Bilirubin Urine: NEGATIVE
Bilirubin Urine: NEGATIVE
Glucose, UA: NEGATIVE
Glucose, UA: NEGATIVE
Glucose, UA: NEGATIVE
Hgb urine dipstick: NEGATIVE
Hgb urine dipstick: NEGATIVE
Hgb urine dipstick: NEGATIVE
Ketones, ur: NEGATIVE
Ketones, ur: NEGATIVE
Ketones, ur: NEGATIVE
Nitrite: NEGATIVE
Nitrite: NEGATIVE
Nitrite: NEGATIVE
Protein, ur: NEGATIVE
Protein, ur: NEGATIVE
Protein, ur: NEGATIVE
Specific Gravity, Urine: 1.02
Specific Gravity, Urine: 1.025
Specific Gravity, Urine: 1.025
Urobilinogen, UA: 0.2
Urobilinogen, UA: 0.2
Urobilinogen, UA: 0.2
pH: 6.5
pH: 8
pH: 6.5

## 2011-05-17 LAB — CBC
HCT: 31 — ABNORMAL LOW
HCT: 31 — ABNORMAL LOW
HCT: 32 — ABNORMAL LOW
HCT: 33.4 — ABNORMAL LOW
Hemoglobin: 10.7 — ABNORMAL LOW
Hemoglobin: 10.6 — ABNORMAL LOW
Hemoglobin: 11.1 — ABNORMAL LOW
Hemoglobin: 11.4 — ABNORMAL LOW
MCHC: 34.6
MCHC: 34.2
MCHC: 34.3
MCHC: 34.6
MCV: 86.3
MCV: 85.1
MCV: 85.8
MCV: 86
Platelets: 192
Platelets: 178
Platelets: 182
Platelets: 206
RBC: 3.59 — ABNORMAL LOW
RBC: 3.61 — ABNORMAL LOW
RBC: 3.76 — ABNORMAL LOW
RBC: 3.88
RDW: 14.1
RDW: 13.9
RDW: 14.5
RDW: 14.5
WBC: 8.9
WBC: 6.6
WBC: 7.4
WBC: 8

## 2011-05-17 LAB — CREATININE CLEARANCE, URINE, 24 HOUR
Collection Interval-CRCL: 24
Collection Interval-CRCL: 24
Creatinine Clearance: 205 — ABNORMAL HIGH
Creatinine Clearance: 286 — ABNORMAL HIGH
Creatinine, 24H Ur: 1607
Creatinine, 24H Ur: 1711
Creatinine, Urine: 121.3
Creatinine, Urine: 71.31
Creatinine: 0.39 — ABNORMAL LOW
Creatinine: 0.58
Urine Total Volume-CRCL: 1325
Urine Total Volume-CRCL: 2400

## 2011-05-17 LAB — CROSSMATCH
ABO/RH(D): O NEG
Antibody Screen: NEGATIVE

## 2011-05-17 LAB — STREP B DNA PROBE: Strep Group B Ag: NEGATIVE

## 2011-05-17 LAB — PROTEIN, URINE, 24 HOUR
Collection Interval-UPROT: 24
Collection Interval-UPROT: 24
Protein, 24H Urine: 80
Protein, 24H Urine: 96
Protein, Urine: 4
Protein, Urine: 6
Urine Total Volume-UPROT: 1325
Urine Total Volume-UPROT: 2400

## 2011-05-17 LAB — WET PREP, GENITAL
Clue Cells Wet Prep HPF POC: NONE SEEN
Trich, Wet Prep: NONE SEEN
Yeast Wet Prep HPF POC: NONE SEEN

## 2011-05-17 LAB — CREATININE, SERUM
Creatinine, Ser: 0.58
GFR calc Af Amer: >60
GFR calc non Af Amer: >60

## 2011-05-17 LAB — LACTATE DEHYDROGENASE: LDH: 88 — ABNORMAL LOW

## 2011-05-17 LAB — URIC ACID
Uric Acid, Serum: 3.6
Uric Acid, Serum: 4.5

## 2011-05-18 LAB — POCT URINALYSIS DIP (DEVICE)
Bilirubin Urine: NEGATIVE
Glucose, UA: NEGATIVE
Hgb urine dipstick: NEGATIVE
Ketones, ur: NEGATIVE
Nitrite: NEGATIVE
Operator id: 297281
Protein, ur: 30 — AB
Specific Gravity, Urine: 1.02
Urobilinogen, UA: 1
pH: 7

## 2011-05-18 LAB — ANTIPHOSPHOLIPID SYNDROME EVAL, BLD
Anticardiolipin IgA: 9 — ABNORMAL LOW (ref <13)
Anticardiolipin IgG: <7 — ABNORMAL LOW (ref <11)
Anticardiolipin IgM: 29 (ref <10)
Antiphosphatidylserine IgA: <20 APS U/mL (ref <20.0)
Antiphosphatidylserine IgG: <11 GPS U/mL (ref <11.0)
Antiphosphatidylserine IgM: <25 MPS U/mL (ref <25.0)
DRVVT: 34.4 — ABNORMAL LOW (ref 36.1–47.0)
Lupus Anticoagulant: NOT DETECTED
PTT Lupus Anticoagulant: 43 (ref 36.3–48.8)

## 2011-05-18 LAB — RH IMMUNE GLOB WKUP(>/=20WKS)(NOT WOMEN'S HOSP): Fetal Screen: NEGATIVE

## 2011-05-18 LAB — I-STAT 8, (EC8 V) (CONVERTED LAB)
Acid-base deficit: 2
BUN: 11
Bicarbonate: 23.3
Chloride: 108
Glucose, Bld: 87
HCT: 34 — ABNORMAL LOW
Hemoglobin: 11.6 — ABNORMAL LOW
Operator id: 294521
Potassium: 4.1
Sodium: 138
TCO2: 24
pCO2, Ven: 38.8 — ABNORMAL LOW
pH, Ven: 7.386 — ABNORMAL HIGH

## 2011-05-18 LAB — PROTHROMBIN GENE MUTATION

## 2011-05-18 LAB — CBC
HCT: 30.2 — ABNORMAL LOW
HCT: 32.8 — ABNORMAL LOW
HCT: 34.1 — ABNORMAL LOW
Hemoglobin: 10.2 — ABNORMAL LOW
Hemoglobin: 11.2 — ABNORMAL LOW
Hemoglobin: 11.7 — ABNORMAL LOW
MCHC: 33.9
MCHC: 34.3
MCHC: 34.3
MCV: 85.3
MCV: 86
MCV: 86.8
Platelets: 170
Platelets: 185
Platelets: 202
RBC: 3.48 — ABNORMAL LOW
RBC: 3.81 — ABNORMAL LOW
RBC: 3.99
RDW: 14.3
RDW: 14.4
RDW: 14.6
WBC: 6.9
WBC: 7.8
WBC: 8

## 2011-05-18 LAB — URINALYSIS, ROUTINE W REFLEX MICROSCOPIC
Bilirubin Urine: NEGATIVE
Glucose, UA: NEGATIVE
Hgb urine dipstick: NEGATIVE
Ketones, ur: 15 — AB
Nitrite: NEGATIVE
Protein, ur: NEGATIVE
Specific Gravity, Urine: 1.025
Urobilinogen, UA: 1
pH: 6.5

## 2011-05-18 LAB — PLATELET ANTIBODIES, DIRECT: Platelet IgG Ab, Direct: NEGATIVE

## 2011-05-18 LAB — FLM(FETAL LUNG MATURITY), AMNIOTIC FLUID
Fetal/Lung Maturity: 74.8
Gestational Age: 36
Gestational Age: 36.2

## 2011-05-18 LAB — PLT GLYCOPROTEIN (INDIRECT) AUTOABS
Plt GP Ia/IIa Autoabs: NOT DETECTED
Plt Glyco IIb/IIIA Autoabs: NOT DETECTED
Plt Glycoprotein Ib/IX Autoabs: NOT DETECTED

## 2011-05-18 LAB — COMPREHENSIVE METABOLIC PANEL
ALT: 16
AST: 15
Albumin: 2.7 — ABNORMAL LOW
Alkaline Phosphatase: 149 — ABNORMAL HIGH
BUN: 5 — ABNORMAL LOW
CO2: 21
Calcium: 9.1
Chloride: 105
Creatinine, Ser: 0.35 — ABNORMAL LOW
GFR calc Af Amer: >60
GFR calc non Af Amer: >60
Glucose, Bld: 73
Potassium: 4.1
Sodium: 135
Total Bilirubin: 0.5
Total Protein: 6.1

## 2011-05-18 LAB — CROSSMATCH
ABO/RH(D): O NEG
Antibody Screen: POSITIVE
DAT, IgG: NEGATIVE

## 2011-05-18 LAB — PROTIME-INR
INR: 0.9
Prothrombin Time: 12.8

## 2011-05-18 LAB — LACTATE DEHYDROGENASE: LDH: 126

## 2011-05-18 LAB — URIC ACID: Uric Acid, Serum: 4.1

## 2011-05-18 LAB — FACTOR 5 LEIDEN

## 2011-05-18 LAB — APTT: aPTT: 31

## 2011-05-18 LAB — RPR: RPR Ser Ql: NONREACTIVE

## 2011-05-18 LAB — PROTEIN S, TOTAL: Protein S Ag, Total: 63 % — ABNORMAL LOW (ref 70–140)

## 2011-05-18 LAB — ANTITHROMBIN III: AntiThromb III Func: 85

## 2011-05-18 LAB — POCT I-STAT CREATININE
Creatinine, Ser: 0.7
Operator id: 294521

## 2011-05-18 LAB — ANA: Anti Nuclear Antibody(ANA): NEGATIVE

## 2011-05-21 LAB — POCT PREGNANCY, URINE
Operator id: 159681
Preg Test, Ur: NEGATIVE

## 2011-05-24 LAB — URINALYSIS, ROUTINE W REFLEX MICROSCOPIC
Bilirubin Urine: NEGATIVE
Glucose, UA: NEGATIVE
Hgb urine dipstick: NEGATIVE
Ketones, ur: NEGATIVE
Nitrite: NEGATIVE
Protein, ur: NEGATIVE
Specific Gravity, Urine: 1.025
Urobilinogen, UA: 0.2
pH: 6

## 2011-05-24 LAB — CBC
HCT: 36.7
Hemoglobin: 12.5
MCHC: 34.1
MCV: 84.4
Platelets: 210
RBC: 4.35
RDW: 14.3
WBC: 6.6

## 2011-05-24 LAB — HCG, SERUM, QUALITATIVE: Preg, Serum: NEGATIVE

## 2011-05-28 LAB — URINALYSIS, ROUTINE W REFLEX MICROSCOPIC
Bilirubin Urine: NEGATIVE
Glucose, UA: NEGATIVE
Hgb urine dipstick: NEGATIVE
Ketones, ur: NEGATIVE
Nitrite: NEGATIVE
Protein, ur: NEGATIVE
Specific Gravity, Urine: 1.033 — ABNORMAL HIGH
Urobilinogen, UA: 1
pH: 6.5

## 2011-05-28 LAB — GC/CHLAMYDIA PROBE AMP, GENITAL
Chlamydia, DNA Probe: NEGATIVE
GC Probe Amp, Genital: POSITIVE — AB

## 2011-05-28 LAB — RPR: RPR Ser Ql: NONREACTIVE

## 2011-05-28 LAB — URINE MICROSCOPIC-ADD ON

## 2011-05-28 LAB — PREGNANCY, URINE: Preg Test, Ur: NEGATIVE

## 2011-06-01 LAB — POCT URINALYSIS DIP (DEVICE)
Bilirubin Urine: NEGATIVE
Bilirubin Urine: NEGATIVE
Glucose, UA: NEGATIVE
Glucose, UA: NEGATIVE
Glucose, UA: NEGATIVE
Hgb urine dipstick: NEGATIVE
Hgb urine dipstick: NEGATIVE
Hgb urine dipstick: NEGATIVE
Ketones, ur: NEGATIVE
Ketones, ur: NEGATIVE
Nitrite: NEGATIVE
Nitrite: NEGATIVE
Nitrite: NEGATIVE
Operator id: 14902
Operator id: 159681
Operator id: 159681
Protein, ur: 100 — AB
Protein, ur: 30 — AB
Protein, ur: NEGATIVE
Specific Gravity, Urine: 1.01
Specific Gravity, Urine: 1.02
Specific Gravity, Urine: >=1.03
Urobilinogen, UA: 0.2
Urobilinogen, UA: 1
Urobilinogen, UA: 1
pH: 6
pH: 7
pH: 7

## 2011-06-04 LAB — PROTEIN, URINE, 24 HOUR
Collection Interval-UPROT: 24
Protein, 24H Urine: 85
Protein, Urine: 10
Urine Total Volume-UPROT: 850

## 2011-06-04 LAB — URINALYSIS, ROUTINE W REFLEX MICROSCOPIC
Bilirubin Urine: NEGATIVE
Bilirubin Urine: NEGATIVE
Glucose, UA: NEGATIVE
Glucose, UA: NEGATIVE
Glucose, UA: NEGATIVE
Hgb urine dipstick: NEGATIVE
Hgb urine dipstick: NEGATIVE
Hgb urine dipstick: NEGATIVE
Ketones, ur: NEGATIVE
Ketones, ur: NEGATIVE
Ketones, ur: NEGATIVE
Leukocytes, UA: NEGATIVE
Nitrite: NEGATIVE
Nitrite: NEGATIVE
Nitrite: NEGATIVE
Protein, ur: 30 — AB
Protein, ur: NEGATIVE
Protein, ur: NEGATIVE
Specific Gravity, Urine: 1.02
Specific Gravity, Urine: 1.02
Specific Gravity, Urine: 1.02
Urobilinogen, UA: 0.2
Urobilinogen, UA: 0.2
Urobilinogen, UA: 1
pH: 6
pH: 6.5
pH: 7.5

## 2011-06-04 LAB — RAPID URINE DRUG SCREEN, HOSP PERFORMED
Amphetamines: NOT DETECTED
Barbiturates: NOT DETECTED
Benzodiazepines: NOT DETECTED
Cocaine: NOT DETECTED
Opiates: NOT DETECTED
Tetrahydrocannabinol: NOT DETECTED

## 2011-06-04 LAB — URINE MICROSCOPIC-ADD ON

## 2011-06-04 LAB — CBC
HCT: 34.7 — ABNORMAL LOW
Hemoglobin: 11.7 — ABNORMAL LOW
MCHC: 33.8
MCV: 87.9
Platelets: 205
RBC: 3.94
RDW: 13.9
WBC: 10.3

## 2011-06-04 LAB — COMPREHENSIVE METABOLIC PANEL
ALT: 12
AST: 12
Albumin: 2.8 — ABNORMAL LOW
Alkaline Phosphatase: 77
BUN: 5 — ABNORMAL LOW
CO2: 24
Calcium: 9.2
Chloride: 105
Creatinine, Ser: 0.44
GFR calc Af Amer: >60
GFR calc non Af Amer: >60
Glucose, Bld: 92
Potassium: 3.5
Sodium: 136
Total Bilirubin: 0.4
Total Protein: 6.7

## 2011-06-04 LAB — LACTATE DEHYDROGENASE: LDH: 84 — ABNORMAL LOW

## 2011-06-04 LAB — FETAL FIBRONECTIN
Fetal Fibronectin: NEGATIVE
Fetal Fibronectin: NEGATIVE

## 2011-06-04 LAB — URIC ACID: Uric Acid, Serum: 3.9

## 2011-06-05 LAB — PROTEIN, URINE, 24 HOUR
Collection Interval-UPROT: 24
Protein, 24H Urine: 162 — ABNORMAL HIGH
Protein, Urine: 9
Urine Total Volume-UPROT: 1800

## 2011-06-05 LAB — COMPREHENSIVE METABOLIC PANEL
ALT: 14
AST: 12
Albumin: 2.7 — ABNORMAL LOW
Alkaline Phosphatase: 63
BUN: 2 — ABNORMAL LOW
CO2: 25
Calcium: 8.7
Chloride: 106
Creatinine, Ser: 0.45
GFR calc Af Amer: >60
GFR calc non Af Amer: >60
Glucose, Bld: 84
Potassium: 3.7
Sodium: 135
Total Bilirubin: 0.5
Total Protein: 6.5

## 2011-06-05 LAB — URINE MICROSCOPIC-ADD ON

## 2011-06-05 LAB — URINALYSIS, ROUTINE W REFLEX MICROSCOPIC
Bilirubin Urine: NEGATIVE
Bilirubin Urine: NEGATIVE
Glucose, UA: NEGATIVE
Glucose, UA: NEGATIVE
Hgb urine dipstick: NEGATIVE
Hgb urine dipstick: NEGATIVE
Ketones, ur: NEGATIVE
Ketones, ur: NEGATIVE
Leukocytes, UA: NEGATIVE
Nitrite: NEGATIVE
Nitrite: POSITIVE — AB
Protein, ur: NEGATIVE
Protein, ur: NEGATIVE
Specific Gravity, Urine: 1.025
Specific Gravity, Urine: 1.015
Urobilinogen, UA: 1
Urobilinogen, UA: 0.2
pH: 6
pH: 6.5

## 2011-06-05 LAB — POCT URINALYSIS DIP (DEVICE)
Bilirubin Urine: NEGATIVE
Bilirubin Urine: NEGATIVE
Glucose, UA: NEGATIVE
Glucose, UA: NEGATIVE
Hgb urine dipstick: NEGATIVE
Hgb urine dipstick: NEGATIVE
Ketones, ur: NEGATIVE
Ketones, ur: NEGATIVE
Nitrite: NEGATIVE
Nitrite: NEGATIVE
Operator id: 148111
Operator id: 148111
Protein, ur: NEGATIVE
Protein, ur: NEGATIVE
Specific Gravity, Urine: 1.015
Specific Gravity, Urine: 1.025
Urobilinogen, UA: 1
Urobilinogen, UA: 2 — ABNORMAL HIGH
pH: 7
pH: 8.5 — ABNORMAL HIGH

## 2011-06-05 LAB — CREATININE CLEARANCE, URINE, 24 HOUR
Collection Interval-CRCL: 24
Creatinine Clearance: 269 — ABNORMAL HIGH
Creatinine, 24H Ur: 1746
Creatinine, Urine: 97
Creatinine: 0.45
Urine Total Volume-CRCL: 1800

## 2011-06-05 LAB — URINE CULTURE
Colony Count: >=100000
Special Requests: NEGATIVE

## 2011-06-05 LAB — GC/CHLAMYDIA PROBE AMP, GENITAL
Chlamydia, DNA Probe: NEGATIVE
GC Probe Amp, Genital: NEGATIVE

## 2011-06-05 LAB — CBC
HCT: 32.9 — ABNORMAL LOW
Hemoglobin: 11.4 — ABNORMAL LOW
MCHC: 34.6
MCV: 87.3
Platelets: 195
RBC: 3.77 — ABNORMAL LOW
RDW: 13.7
WBC: 7.6

## 2011-06-05 LAB — URIC ACID: Uric Acid, Serum: 3.9

## 2011-06-05 LAB — STREP B DNA PROBE: Strep Group B Ag: NEGATIVE

## 2011-06-05 LAB — WET PREP, GENITAL
Clue Cells Wet Prep HPF POC: NONE SEEN
Trich, Wet Prep: NONE SEEN
Yeast Wet Prep HPF POC: NONE SEEN

## 2011-06-05 LAB — LACTATE DEHYDROGENASE: LDH: 77 — ABNORMAL LOW

## 2011-06-06 LAB — URINALYSIS, ROUTINE W REFLEX MICROSCOPIC
Bilirubin Urine: NEGATIVE
Glucose, UA: NEGATIVE
Hgb urine dipstick: NEGATIVE
Ketones, ur: NEGATIVE
Nitrite: NEGATIVE
Protein, ur: NEGATIVE
Specific Gravity, Urine: 1.02
Urobilinogen, UA: 1
pH: 7

## 2011-06-07 LAB — POCT URINALYSIS DIP (DEVICE)
Bilirubin Urine: NEGATIVE
Glucose, UA: NEGATIVE
Hgb urine dipstick: NEGATIVE
Ketones, ur: NEGATIVE
Nitrite: NEGATIVE
Operator id: 194561
Protein, ur: NEGATIVE
Specific Gravity, Urine: 1.02
Urobilinogen, UA: 1
pH: 7

## 2011-06-07 LAB — URINALYSIS, ROUTINE W REFLEX MICROSCOPIC
Bilirubin Urine: NEGATIVE
Bilirubin Urine: NEGATIVE
Bilirubin Urine: NEGATIVE
Glucose, UA: NEGATIVE
Glucose, UA: NEGATIVE
Glucose, UA: NEGATIVE
Hgb urine dipstick: NEGATIVE
Hgb urine dipstick: NEGATIVE
Hgb urine dipstick: NEGATIVE
Ketones, ur: NEGATIVE
Ketones, ur: NEGATIVE
Ketones, ur: 15 — AB
Nitrite: NEGATIVE
Nitrite: NEGATIVE
Nitrite: NEGATIVE
Protein, ur: NEGATIVE
Protein, ur: NEGATIVE
Protein, ur: NEGATIVE
Specific Gravity, Urine: 1.015
Specific Gravity, Urine: 1.02
Specific Gravity, Urine: 1.02
Urobilinogen, UA: 0.2
Urobilinogen, UA: 1
Urobilinogen, UA: 1
pH: 6.5
pH: 7
pH: 6.5

## 2011-06-07 LAB — WET PREP, GENITAL
Clue Cells Wet Prep HPF POC: NONE SEEN
Trich, Wet Prep: NONE SEEN
Yeast Wet Prep HPF POC: NONE SEEN

## 2011-06-07 LAB — FETAL FIBRONECTIN: Fetal Fibronectin: POSITIVE

## 2011-06-07 LAB — GC/CHLAMYDIA PROBE AMP, GENITAL
Chlamydia, DNA Probe: NEGATIVE
GC Probe Amp, Genital: NEGATIVE

## 2011-06-08 LAB — RH IMMUNE GLOBULIN WORKUP (NOT WOMEN'S HOSP)
ABO/RH(D): O NEG
Antibody Screen: NEGATIVE

## 2011-06-11 LAB — URINALYSIS, ROUTINE W REFLEX MICROSCOPIC
Bilirubin Urine: NEGATIVE
Glucose, UA: NEGATIVE
Hgb urine dipstick: NEGATIVE
Ketones, ur: NEGATIVE
Nitrite: NEGATIVE
Protein, ur: NEGATIVE
Specific Gravity, Urine: 1.025
Urobilinogen, UA: 0.2
pH: 6.5

## 2011-06-11 LAB — CBC
HCT: 34.1 — ABNORMAL LOW
Hemoglobin: 11.7 — ABNORMAL LOW
MCHC: 34.2
MCV: 86
Platelets: 238
RBC: 3.97
RDW: 14.4 — ABNORMAL HIGH
WBC: 9.8

## 2011-06-11 LAB — GC/CHLAMYDIA PROBE AMP, GENITAL
Chlamydia, DNA Probe: NEGATIVE
GC Probe Amp, Genital: NEGATIVE

## 2011-06-11 LAB — WET PREP, GENITAL
Trich, Wet Prep: NONE SEEN
Yeast Wet Prep HPF POC: NONE SEEN

## 2011-06-12 LAB — CBC
HCT: 34 — ABNORMAL LOW
HCT: 35.9 — ABNORMAL LOW
Hemoglobin: 11.4 — ABNORMAL LOW
Hemoglobin: 12
MCHC: 33.4
MCHC: 33.5
MCV: 86.1
MCV: 86.7
Platelets: 208
Platelets: 247
RBC: 3.95
RBC: 4.14
RDW: 14.2 — ABNORMAL HIGH
RDW: 14.4 — ABNORMAL HIGH
WBC: 7.5
WBC: 8.1

## 2011-06-12 LAB — URINALYSIS, ROUTINE W REFLEX MICROSCOPIC
Bilirubin Urine: NEGATIVE
Glucose, UA: NEGATIVE
Hgb urine dipstick: NEGATIVE
Ketones, ur: NEGATIVE
Nitrite: NEGATIVE
Protein, ur: NEGATIVE
Specific Gravity, Urine: 1.025
Urobilinogen, UA: 0.2
pH: 6

## 2011-06-12 LAB — HCG, QUANTITATIVE, PREGNANCY: hCG, Beta Chain, Quant, S: 2191 — ABNORMAL HIGH

## 2011-06-13 LAB — COMPREHENSIVE METABOLIC PANEL
ALT: 10
AST: 11
Albumin: 3.5
Alkaline Phosphatase: 63
BUN: 9
CO2: 28
Calcium: 9.3
Chloride: 103
Creatinine, Ser: 0.61
GFR calc Af Amer: >60
GFR calc non Af Amer: >60
Glucose, Bld: 90
Potassium: 3.8
Sodium: 136
Total Bilirubin: 0.2 — ABNORMAL LOW
Total Protein: 7.3

## 2011-06-13 LAB — WET PREP, GENITAL
Trich, Wet Prep: NONE SEEN
Yeast Wet Prep HPF POC: NONE SEEN

## 2011-06-13 LAB — POCT PREGNANCY, URINE
Operator id: 25721
Preg Test, Ur: POSITIVE

## 2011-06-13 LAB — URINALYSIS, ROUTINE W REFLEX MICROSCOPIC
Bilirubin Urine: NEGATIVE
Glucose, UA: NEGATIVE
Glucose, UA: NEGATIVE
Hgb urine dipstick: NEGATIVE
Hgb urine dipstick: NEGATIVE
Ketones, ur: 15 — AB
Ketones, ur: NEGATIVE
Nitrite: NEGATIVE
Nitrite: NEGATIVE
Protein, ur: NEGATIVE
Protein, ur: NEGATIVE
Specific Gravity, Urine: 1.015
Specific Gravity, Urine: >1.03 — ABNORMAL HIGH
Urobilinogen, UA: 1
Urobilinogen, UA: 1
pH: 6
pH: 7

## 2011-06-13 LAB — GC/CHLAMYDIA PROBE AMP, GENITAL
Chlamydia, DNA Probe: NEGATIVE
GC Probe Amp, Genital: NEGATIVE

## 2011-06-13 LAB — CBC
HCT: 37
Hemoglobin: 12.3
MCHC: 33.2
MCV: 86.5
Platelets: 245
RBC: 4.28
RDW: 14.3 — ABNORMAL HIGH
WBC: 8.1

## 2011-06-13 LAB — ABO/RH: ABO/RH(D): O NEG

## 2011-06-13 LAB — HCG, QUANTITATIVE, PREGNANCY
hCG, Beta Chain, Quant, S: 186 — ABNORMAL HIGH
hCG, Beta Chain, Quant, S: 556 — ABNORMAL HIGH

## 2011-06-13 LAB — RPR: RPR Ser Ql: NONREACTIVE

## 2011-06-19 ENCOUNTER — Inpatient Hospital Stay (INDEPENDENT_AMBULATORY_CARE_PROVIDER_SITE_OTHER)
Admission: RE | Admit: 2011-06-19 | Discharge: 2011-06-19 | Disposition: A | Discharge status: Home / Self Care | Payer: Self-pay | Source: Ambulatory Visit | Attending: Family Medicine | Admitting: Family Medicine

## 2011-06-19 ENCOUNTER — Ambulatory Visit (INDEPENDENT_AMBULATORY_CARE_PROVIDER_SITE_OTHER): Payer: Self-pay

## 2011-06-19 DIAGNOSIS — IMO0002 Reserved for concepts with insufficient information to code with codable children: Secondary | ICD-10-CM

## 2011-07-21 ENCOUNTER — Ambulatory Visit (HOSPITAL_COMMUNITY)
Admission: RE | Admit: 2011-07-21 | Discharge: 2011-07-21 | Disposition: A | Discharge status: Home / Self Care | Payer: Self-pay | Source: Ambulatory Visit | Attending: Emergency Medicine | Admitting: Emergency Medicine

## 2011-07-21 ENCOUNTER — Encounter (HOSPITAL_COMMUNITY): Payer: Self-pay | Admitting: *Deleted

## 2011-07-21 ENCOUNTER — Emergency Department (HOSPITAL_COMMUNITY)
Admission: EM | Admit: 2011-07-21 | Discharge: 2011-07-21 | Disposition: A | Discharge status: Home / Self Care | Payer: Medicaid Other | Attending: Emergency Medicine | Admitting: Emergency Medicine

## 2011-07-21 ENCOUNTER — Inpatient Hospital Stay (HOSPITAL_COMMUNITY)
Admission: EM | Admit: 2011-07-21 | Discharge: 2011-07-25 | DRG: 418 | Disposition: A | Discharge status: Home / Self Care | Payer: Medicaid Other | Attending: Surgery | Admitting: Surgery

## 2011-07-21 ENCOUNTER — Other Ambulatory Visit (HOSPITAL_COMMUNITY): Payer: Self-pay | Admitting: Emergency Medicine

## 2011-07-21 DIAGNOSIS — F17200 Nicotine dependence, unspecified, uncomplicated: Secondary | ICD-10-CM | POA: Diagnosis present

## 2011-07-21 DIAGNOSIS — E119 Type 2 diabetes mellitus without complications: Secondary | ICD-10-CM | POA: Diagnosis present

## 2011-07-21 DIAGNOSIS — R109 Unspecified abdominal pain: Secondary | ICD-10-CM | POA: Insufficient documentation

## 2011-07-21 DIAGNOSIS — Z8673 Personal history of transient ischemic attack (TIA), and cerebral infarction without residual deficits: Secondary | ICD-10-CM

## 2011-07-21 DIAGNOSIS — K560 Paralytic ileus: Secondary | ICD-10-CM | POA: Diagnosis not present

## 2011-07-21 DIAGNOSIS — L02219 Cutaneous abscess of trunk, unspecified: Secondary | ICD-10-CM | POA: Insufficient documentation

## 2011-07-21 DIAGNOSIS — K8042 Calculus of bile duct with acute cholecystitis without obstruction: Secondary | ICD-10-CM | POA: Diagnosis present

## 2011-07-21 DIAGNOSIS — K7689 Other specified diseases of liver: Secondary | ICD-10-CM | POA: Insufficient documentation

## 2011-07-21 DIAGNOSIS — R7989 Other specified abnormal findings of blood chemistry: Secondary | ICD-10-CM | POA: Insufficient documentation

## 2011-07-21 DIAGNOSIS — R1011 Right upper quadrant pain: Secondary | ICD-10-CM

## 2011-07-21 DIAGNOSIS — R10819 Abdominal tenderness, unspecified site: Secondary | ICD-10-CM | POA: Insufficient documentation

## 2011-07-21 DIAGNOSIS — R112 Nausea with vomiting, unspecified: Secondary | ICD-10-CM | POA: Insufficient documentation

## 2011-07-21 DIAGNOSIS — I10 Essential (primary) hypertension: Secondary | ICD-10-CM | POA: Insufficient documentation

## 2011-07-21 DIAGNOSIS — Z86718 Personal history of other venous thrombosis and embolism: Secondary | ICD-10-CM

## 2011-07-21 DIAGNOSIS — K8062 Calculus of gallbladder and bile duct with acute cholecystitis without obstruction: Principal | ICD-10-CM | POA: Diagnosis present

## 2011-07-21 DIAGNOSIS — K8020 Calculus of gallbladder without cholecystitis without obstruction: Secondary | ICD-10-CM | POA: Insufficient documentation

## 2011-07-21 LAB — DIFFERENTIAL
Basophils Absolute: 0 10*3/uL (ref 0.0–0.1)
Basophils Relative: 0 % (ref 0–1)
Eosinophils Absolute: 0.1 10*3/uL (ref 0.0–0.7)
Eosinophils Relative: 2 % (ref 0–5)
Lymphocytes Relative: 36 % (ref 12–46)
Lymphs Abs: 1.8 10*3/uL (ref 0.7–4.0)
Monocytes Absolute: 0.5 10*3/uL (ref 0.1–1.0)
Monocytes Relative: 10 % (ref 3–12)
Neutro Abs: 2.6 10*3/uL (ref 1.7–7.7)
Neutrophils Relative %: 53 % (ref 43–77)

## 2011-07-21 LAB — COMPREHENSIVE METABOLIC PANEL
ALT: 388 U/L — ABNORMAL HIGH (ref 0–35)
AST: 435 U/L — ABNORMAL HIGH (ref 0–37)
Albumin: 3.5 g/dL (ref 3.5–5.2)
Alkaline Phosphatase: 146 U/L — ABNORMAL HIGH (ref 39–117)
BUN: 9 mg/dL (ref 6–23)
CO2: 28 mEq/L (ref 19–32)
Calcium: 9.1 mg/dL (ref 8.4–10.5)
Chloride: 102 mEq/L (ref 96–112)
Creatinine, Ser: 0.66 mg/dL (ref 0.50–1.10)
GFR calc Af Amer: >90 mL/min (ref >90)
GFR calc non Af Amer: >90 mL/min (ref >90)
Glucose, Bld: 83 mg/dL (ref 70–99)
Potassium: 3.9 mEq/L (ref 3.5–5.1)
Sodium: 136 mEq/L (ref 135–145)
Total Bilirubin: 0.6 mg/dL (ref 0.3–1.2)
Total Protein: 7.5 g/dL (ref 6.0–8.3)

## 2011-07-21 LAB — URINALYSIS, ROUTINE W REFLEX MICROSCOPIC
Bilirubin Urine: NEGATIVE
Glucose, UA: NEGATIVE mg/dL
Hgb urine dipstick: NEGATIVE
Ketones, ur: NEGATIVE mg/dL
Leukocytes, UA: NEGATIVE
Nitrite: NEGATIVE
Protein, ur: NEGATIVE mg/dL
Specific Gravity, Urine: 1.02 (ref 1.005–1.030)
Urobilinogen, UA: 1 mg/dL (ref 0.0–1.0)
pH: 6 (ref 5.0–8.0)

## 2011-07-21 LAB — CBC
HCT: 35.9 % — ABNORMAL LOW (ref 36.0–46.0)
Hemoglobin: 12 g/dL (ref 12.0–15.0)
MCH: 28.2 pg (ref 26.0–34.0)
MCHC: 33.4 g/dL (ref 30.0–36.0)
MCV: 84.5 fL (ref 78.0–100.0)
Platelets: 215 10*3/uL (ref 150–400)
RBC: 4.25 MIL/uL (ref 3.87–5.11)
RDW: 13.9 % (ref 11.5–15.5)
WBC: 5 10*3/uL (ref 4.0–10.5)

## 2011-07-21 LAB — WET PREP, GENITAL
Clue Cells Wet Prep HPF POC: NONE SEEN
Trich, Wet Prep: NONE SEEN
Yeast Wet Prep HPF POC: NONE SEEN

## 2011-07-21 LAB — PREGNANCY, URINE: Preg Test, Ur: NEGATIVE

## 2011-07-21 LAB — GLUCOSE, CAPILLARY: Glucose-Capillary: 72 mg/dL (ref 70–99)

## 2011-07-21 LAB — LIPASE, BLOOD: Lipase: 19 U/L (ref 11–59)

## 2011-07-21 MED ORDER — HYDROMORPHONE HCL PF 1 MG/ML IJ SOLN
1.0000 mg | Freq: Once | INTRAMUSCULAR | Status: AC
  Administered 2011-07-22: 1 mg via INTRAVENOUS
  Filled 2011-07-21: qty 1

## 2011-07-21 MED ORDER — LIDOCAINE HCL 1 % IJ SOLN
INTRAMUSCULAR | Status: AC
  Administered 2011-07-21: 20:00:00
  Filled 2011-07-21: qty 20

## 2011-07-21 MED ORDER — SODIUM CHLORIDE 0.9 % IV SOLN
Freq: Once | INTRAVENOUS | Status: AC
  Administered 2011-07-21: 18:00:00 via INTRAVENOUS

## 2011-07-21 MED ORDER — DEXTROSE 50 % IV SOLN
25.0000 mL | Freq: Once | INTRAVENOUS | Status: AC
  Administered 2011-07-21: 25 mL via INTRAVENOUS
  Filled 2011-07-21: qty 50

## 2011-07-21 MED ORDER — ONDANSETRON HCL 4 MG/2ML IJ SOLN
4.0000 mg | Freq: Once | INTRAMUSCULAR | Status: AC
  Administered 2011-07-22: 4 mg via INTRAVENOUS
  Filled 2011-07-21: qty 2

## 2011-07-21 MED ORDER — ONDANSETRON HCL 4 MG/2ML IJ SOLN
4.0000 mg | Freq: Once | INTRAMUSCULAR | Status: AC
  Administered 2011-07-21: 4 mg via INTRAVENOUS
  Filled 2011-07-21: qty 2

## 2011-07-21 MED ORDER — HYDROMORPHONE HCL PF 1 MG/ML IJ SOLN
1.0000 mg | Freq: Once | INTRAMUSCULAR | Status: AC
  Administered 2011-07-21: 1 mg via INTRAVENOUS
  Filled 2011-07-21: qty 1

## 2011-07-21 NOTE — ED Notes (Signed)
Pt c/o feeling low blood sugar.  CBG 72.  New meds ordered.  Pt remains NPO.  Meds given for pain.

## 2011-07-21 NOTE — ED Notes (Signed)
Patient transported to Ultrasound 

## 2011-07-21 NOTE — ED Provider Notes (Signed)
History     CSN: [id number] Arrival date & time: 07/21/2011  2:21 PM   First MD Initiated Contact with Patient 07/21/11 1632      Chief Complaint  Patient presents with  . Abdominal Pain    (Consider location/radiation/quality/duration/timing/severity/associated sxs/prior treatment) Patient is a 30 y.o. female presenting with abdominal pain. The history is provided by the patient.  Abdominal Pain The primary symptoms of the illness include abdominal pain, nausea and vomiting. The primary symptoms of the illness do not include fever, diarrhea or vaginal discharge. Primary symptoms comment: She has known gall stones with periodic pain and vomiting. She is waiting for insurance to consult a Careers adviser. The current episode started yesterday. The onset of the illness was gradual.  Onset: She also complains of lower abdominal pain that is chronic, but that is now radiating into her thigh. She also reports no menses in 3 months, which is unusual for her normal pattern. She denies vaginal discharge, dysuria. No fever.   Symptoms associated with the illness do not include chills, anorexia or heartburn. Significant associated medical issues include gallstones.    Past Medical History  Diagnosis Date  . Diabetes mellitus     A2DM during pregnancy. now on metformin.   [name] Hypertension   . Stroke 2012  . SVT (supraventricular tachycardia)     on propanalol  . Gonorrhea 2010    treated  . Chlamydia 2012    treated  . Pregnancy induced hypertension     pre E with pregnancies  . Asthma   . Rheumatoid arthritis     Past Surgical History  Procedure Date  . Tubal ligation 2009  . Finger surgery 1993    left ring finger to correct bone "problem"    Family History  Problem Relation Age of Onset  . Anesthesia problems Mother     difficult to arouse, SOB    History  Substance Use Topics  . Smoking status: Current Everyday Smoker -- 0.2 packs/day for 4 years    Types: Cigarettes  .  Smokeless tobacco: Not on file  . Alcohol Use: Yes     socially    OB History    Grav Para Term Preterm Abortions TAB SAB Ect Mult Living   9 6 0 6 3 0 3 0 0 6       Review of Systems  Constitutional: Negative for fever and chills.  HENT: Negative.   Respiratory: Negative.   Cardiovascular: Negative.   Gastrointestinal: Positive for nausea, vomiting and abdominal pain. Negative for heartburn, diarrhea and anorexia.  Genitourinary: Negative.  Negative for vaginal discharge and vaginal pain.  Musculoskeletal: Negative.   Skin: Negative.   Neurological: Negative.     Allergies  Amoxicillin  Home Medications   Current Outpatient Rx  Name Route Sig Dispense Refill  . ACETAMINOPHEN 500 MG PO TABS Oral Take 500-1,000 mg by mouth every 6 (six) hours as needed. For pain/headache.     . ASPIRIN 81 MG PO TBEC Oral Take 81 mg by mouth daily.      [name] FAMOTIDINE 20 MG PO TABS Oral Take 20 mg by mouth 2 (two) times daily.      [name] HYDROCHLOROTHIAZIDE 25 MG PO TABS Oral Take 25 mg by mouth daily.      [name] LISINOPRIL 10 MG PO TABS Oral Take 10 mg by mouth daily.      . MULTI-VITAMIN/MINERALS PO TABS Oral Take 1 tablet by mouth daily.      [name] PROPRANOLOL  HCL 10 MG PO TABS Oral Take 1 tablet (10 mg total) by mouth 2 (two) times daily. 40 tablet 0  . SIMVASTATIN 10 MG PO TABS Oral Take 10 mg by mouth at bedtime.        BP 130/79  Pulse 76  Temp(Src) 98 F (36.7 C) (Oral)  Resp 18  SpO2 100%  LMP 05/09/2011  Physical Exam  Constitutional: She appears well-developed and well-nourished.  HENT:  Head: Normocephalic.  Neck: Normal range of motion. Neck supple.  Cardiovascular: Normal rate and regular rhythm.   Pulmonary/Chest: Effort normal and breath sounds normal.  Abdominal: Soft. Bowel sounds are normal. There is tenderness in the right upper quadrant and right lower quadrant. There is no rebound and no guarding.         Patient is morbidly obese.  Musculoskeletal: Normal range of  motion.  Neurological: She is alert. No cranial nerve deficit.  Skin: Skin is warm and dry. No rash noted.  Psychiatric: She has a normal mood and affect.    ED Course  Procedures (including critical care time)  Labs Reviewed - No data to display No results found.   No diagnosis found.    MDM  INCISION AND DRAINAGE Performed by: Langley Adie A Consent: Verbal consent obtained. Risks and benefits: risks, benefits and alternatives were discussed Type: abscess  Body area: pubic mons  Anesthesia: local infiltration  Local anesthetic: lidocaine 1% w/o epinephrine  Anesthetic total: 2 ml  Complexity: complex Blunt dissection to break up loculations  Drainage: purulent  Drainage amount: moderate, purulent  Packing material: 1/4 in iodoform gauze  Patient tolerance: Patient tolerated the procedure well with no immediate complications.   20:15:  Patient care given to Earley Favor, NP.           Rodena Medin, PA 07/21/11 2018

## 2011-07-21 NOTE — ED Notes (Signed)
Removed IV from LAC.  IV occluded.  Placed prior to my shift.

## 2011-07-21 NOTE — ED Notes (Signed)
Pt not in room.

## 2011-07-21 NOTE — ED Notes (Signed)
Pt states that around 0200 this morning, she began having symptoms as previously experienced with cholelithiasis.  Pt came to the hospital earlier today for examination and laboratory work showed an abnormality in her liver enzymes.  Pt had to leave earlier due to child care issues and returns this evening for U/S follow up from earlier today.

## 2011-07-21 NOTE — ED Provider Notes (Signed)
History     CSN: [id number] Arrival date & time: 07/21/2011 10:38 PM   First MD Initiated Contact with Patient 07/21/11 2300      Chief Complaint  Patient presents with  . Abdominal Pain    (Consider location/radiation/quality/duration/timing/severity/associated sxs/prior treatment) Patient is a 30 y.o. female presenting with abdominal pain. The history is provided by the patient.  Abdominal Pain The primary symptoms of the illness include abdominal pain. The primary symptoms of the illness do not include fever, shortness of breath or dysuria. The current episode started 13 to 24 hours ago. The onset of the illness was gradual. The problem has not changed since onset. Associated with: Eating fatty foods. The patient states that she believes she is currently not pregnant. The patient has not had a change in bowel habit. Risk factors: Known history of gallstones. Symptoms associated with the illness do not include chills, anorexia, diaphoresis, heartburn, constipation, urgency, hematuria, frequency or back pain. Significant associated medical issues include gallstones.   Right upper quadrant abdominal pain worsened last 24 hours with no history of cholelithiasis. Patient was evaluated emergency department earlier today found to have elevated transaminases. No fevers or vomiting. In the patient had to leave before getting her ultrasound has now returned to get an ultrasound for further evaluation. No radiation of pain. Sharp in quality. Moderate in severity. Worse with eating fatty meals   Past Medical History  Diagnosis Date  . Diabetes mellitus     A2DM during pregnancy. now on metformin.   [name] Hypertension   . Stroke 2012  . SVT (supraventricular tachycardia)     on propanalol  . Gonorrhea 2010    treated  . Chlamydia 2012    treated  . Pregnancy induced hypertension     pre E with pregnancies  . Asthma   . Rheumatoid arthritis     Past Surgical History  Procedure Date  .  Tubal ligation 2009  . Finger surgery 1993    left ring finger to correct bone "problem"    Family History  Problem Relation Age of Onset  . Anesthesia problems Mother     difficult to arouse, SOB    History  Substance Use Topics  . Smoking status: Current Everyday Smoker -- 0.2 packs/day for 4 years    Types: Cigarettes  . Smokeless tobacco: Not on file  . Alcohol Use: Yes     socially    OB History    Grav Para Term Preterm Abortions TAB SAB Ect Mult Living   9 6 0 6 3 0 3 0 0 6       Review of Systems  Constitutional: Negative for fever, chills and diaphoresis.  HENT: Negative for neck pain and neck stiffness.   Eyes: Negative for pain.  Respiratory: Negative for shortness of breath.   Cardiovascular: Negative for chest pain.  Gastrointestinal: Positive for abdominal pain. Negative for heartburn, constipation and anorexia.  Genitourinary: Negative for dysuria, urgency, frequency and hematuria.  Musculoskeletal: Negative for back pain.  Skin: Negative for rash.  Neurological: Negative for headaches.  All other systems reviewed and are negative.    Allergies  Amoxicillin  Home Medications   Current Outpatient Rx  Name Route Sig Dispense Refill  . ACETAMINOPHEN 500 MG PO TABS Oral Take 500-1,000 mg by mouth every 6 (six) hours as needed. For pain/headache.     . ASPIRIN 81 MG PO TBEC Oral Take 81 mg by mouth daily.      [name] FAMOTIDINE 20  MG PO TABS Oral Take 20 mg by mouth 2 (two) times daily.      [name] HYDROCHLOROTHIAZIDE 25 MG PO TABS Oral Take 25 mg by mouth daily.      [name] LISINOPRIL 10 MG PO TABS Oral Take 10 mg by mouth daily.      . MULTI-VITAMIN/MINERALS PO TABS Oral Take 1 tablet by mouth daily.      [name] PROPRANOLOL HCL 10 MG PO TABS Oral Take 1 tablet (10 mg total) by mouth 2 (two) times daily. 40 tablet 0  . SIMVASTATIN 10 MG PO TABS Oral Take 10 mg by mouth at bedtime.        BP 170/99  Pulse 83  Temp(Src) 98.6 F (37 C) (Oral)  Resp 20  SpO2 99%   LMP 05/09/2011  Physical Exam  Constitutional: She is oriented to person, place, and time. She appears well-developed and well-nourished.  HENT:  Head: Normocephalic and atraumatic.  Eyes: Conjunctivae and EOM are normal. Pupils are equal, round, and reactive to light.  Neck: Trachea normal. Neck supple. No thyromegaly present.  Cardiovascular: Normal rate, regular rhythm, S1 normal, S2 normal and normal pulses.     No systolic murmur is present   No diastolic murmur is present  Pulses:      Radial pulses are 2+ on the right side, and 2+ on the left side.  Pulmonary/Chest: Effort normal and breath sounds normal. She has no wheezes. She has no rhonchi. She has no rales. She exhibits no tenderness.  Abdominal: Soft. Normal appearance and bowel sounds are normal. There is tenderness. There is no rebound, no guarding, no CVA tenderness and negative Murphy's sign.       Positive murphys sign  Musculoskeletal:       BLE:s Calves nontender, no cords or erythema, negative Homans sign  Neurological: She is alert and oriented to person, place, and time. She has normal strength. No cranial nerve deficit or sensory deficit. GCS eye subscore is 4. GCS verbal subscore is 5. GCS motor subscore is 6.  Skin: Skin is warm and dry. No rash noted. She is not diaphoretic.  Psychiatric: Her speech is normal.       Cooperative and appropriate    ED Course  Procedures (including critical care time)  Results for orders placed during the hospital encounter of 07/21/11  CBC      Component Value Range   WBC 5.0  4.0 - 10.5 (K/uL)   RBC 4.25  3.87 - 5.11 (MIL/uL)   Hemoglobin 12.0  12.0 - 15.0 (g/dL)   HCT 72.5 (*) 36.6 - 46.0 (%)   MCV 84.5  78.0 - 100.0 (fL)   MCH 28.2  26.0 - 34.0 (pg)   MCHC 33.4  30.0 - 36.0 (g/dL)   RDW 44.0  34.7 - 42.5 (%)   Platelets 215  150 - 400 (K/uL)  DIFFERENTIAL      Component Value Range   Neutrophils Relative 53  43 - 77 (%)   Neutro Abs 2.6  1.7 - 7.7 (K/uL)    Lymphocytes Relative 36  12 - 46 (%)   Lymphs Abs 1.8  0.7 - 4.0 (K/uL)   Monocytes Relative 10  3 - 12 (%)   Monocytes Absolute 0.5  0.1 - 1.0 (K/uL)   Eosinophils Relative 2  0 - 5 (%)   Eosinophils Absolute 0.1  0.0 - 0.7 (K/uL)   Basophils Relative 0  0 - 1 (%)   Basophils Absolute 0.0  0.0 - 0.1 (K/uL)  COMPREHENSIVE METABOLIC PANEL      Component Value Range   Sodium 136  135 - 145 (mEq/L)   Potassium 3.9  3.5 - 5.1 (mEq/L)   Chloride 102  96 - 112 (mEq/L)   CO2 28  19 - 32 (mEq/L)   Glucose, Bld 83  70 - 99 (mg/dL)   BUN 9  6 - 23 (mg/dL)   Creatinine, Ser 1.61  0.50 - 1.10 (mg/dL)   Calcium 9.1  8.4 - 09.6 (mg/dL)   Total Protein 7.5  6.0 - 8.3 (g/dL)   Albumin 3.5  3.5 - 5.2 (g/dL)   AST 045 (*) 0 - 37 (U/L)   ALT 388 (*) 0 - 35 (U/L)   Alkaline Phosphatase 146 (*) 39 - 117 (U/L)   Total Bilirubin 0.6  0.3 - 1.2 (mg/dL)   GFR calc non Af Amer >90  >90 (mL/min)   GFR calc Af Amer >90  >90 (mL/min)  LIPASE, BLOOD      Component Value Range   Lipase 19  11 - 59 (U/L)  URINALYSIS, ROUTINE W REFLEX MICROSCOPIC      Component Value Range   Color, Urine YELLOW  YELLOW    Appearance CLEAR  CLEAR    Specific Gravity, Urine 1.020  1.005 - 1.030    pH 6.0  5.0 - 8.0    Glucose, UA NEGATIVE  NEGATIVE (mg/dL)   Hgb urine dipstick NEGATIVE  NEGATIVE    Bilirubin Urine NEGATIVE  NEGATIVE    Ketones, ur NEGATIVE  NEGATIVE (mg/dL)   Protein, ur NEGATIVE  NEGATIVE (mg/dL)   Urobilinogen, UA 1.0  0.0 - 1.0 (mg/dL)   Nitrite NEGATIVE  NEGATIVE    Leukocytes, UA NEGATIVE  NEGATIVE   PREGNANCY, URINE      Component Value Range   Preg Test, Ur NEGATIVE    WET PREP, GENITAL      Component Value Range   Yeast, Wet Prep NONE SEEN  NONE SEEN    Trich, Wet Prep NONE SEEN  NONE SEEN    Clue Cells, Wet Prep NONE SEEN  NONE SEEN    WBC, Wet Prep HPF POC FEW (*) NONE SEEN   GLUCOSE, CAPILLARY      Component Value Range   Glucose-Capillary 72  70 - 99 (mg/dL)   *RADIOLOGY REPORT*    Clinical Data: Elevated LFTs; known gallstones. Abdominal pain,  nausea and vomiting. Assess for cholecystitis or obstruction.  ABDOMINAL ULTRASOUND COMPLETE  Comparison: Abdominal ultrasound performed 04/24/2011  Findings:  Gallbladder: A large 2.0 cm stone is again noted layering  dependently at the base of the gallbladder; this does not move  appreciably on decubitus imaging, and may be lodged at the base of  the gallbladder. No gallbladder wall thickening is appreciated; no  pericholecystic fluid is seen to suggest cholecystitis. No  ultrasonographic Murphy's sign is elicited.  Common Bile Duct: 0.6 cm in diameter; within normal limits in  caliber.  Liver: Diffusely increased parenchymal echogenicity and coarsened  echotexture, compatible with fatty infiltration; no focal lesions  identified. Limited Doppler evaluation demonstrates normal blood  flow within the liver.  IVC: Unremarkable in appearance.  Pancreas: Although the pancreas is difficult to visualize in its  entirety due to overlying bowel gas, no focal pancreatic  abnormality is identified.  Spleen: 6.7 cm in length; within normal limits in size and  echotexture.  Right kidney: 12.2 cm in length; normal in size, configuration and  parenchymal echogenicity.  No evidence of mass or hydronephrosis.  Left kidney: 12.4 cm in length; normal in size, configuration and  parenchymal echogenicity. No evidence of mass or hydronephrosis.  Abdominal Aorta: Normal in caliber; no aneurysm identified.  IMPRESSION:  1. Previously noted 2.0 cm stone appears to be lodged at the base  of the gallbladder; no evidence for obstruction or cholecystitis.  No ultrasonographic Murphy's sign seen.  2. Fatty infiltration noted within the liver.  Original Report Authenticated By: Tonia Ghent, M.D.  IV fluids. N.p.o. Pain control with intermittent relief of symptoms. I discussed case as above with Dr. Ezzard Standing at 1:05 AM and he recommends  admission for cholecystectomy in a.m. Patient admitted.     MDM   Admit cholelithiasis symptomatic with elevated transaminases.       Sunnie Nielsen, MD 07/22/11 [phone]

## 2011-07-21 NOTE — ED Notes (Signed)
Pt has know history of Gallbladder problems, waiting for Medicaid to become effective to see surgeon, pt has multiple complaints of lower abd pain, rt chest wall pain

## 2011-07-21 NOTE — ED Notes (Signed)
Unable to void at this time for urine pregnancy

## 2011-07-21 NOTE — ED Notes (Signed)
This pt has returned from home pt has family issues. Has returned for follow up- NO iv was taken out before discharge.  Pt at present without distress.  Pt requesting something for pain

## 2011-07-21 NOTE — ED Provider Notes (Signed)
  Physical Exam  BP 123/56  Pulse 71  Temp(Src) 98.5 F (36.9 C) (Oral)  Resp 18  SpO2 100%  LMP 05/09/2011  Physical Exam Patient with RUQ pain awaiting Ultrasound now requesting addition pain control. Nurse also reports blood sugar is 72 will odrer 25 ml Dextrose 50% as patient is NPO  ED Course  Procedures 9:30 PM Patient unable to wait any longer as family needs supercede her need for further medical evaluation advised her to follow up with the GI specialist as previously reccommended. MDM Gall stone with elevated transaminase       Arman Filter, NP 07/21/11 2037  Arman Filter, NP 07/21/11 2132

## 2011-07-22 ENCOUNTER — Other Ambulatory Visit (INDEPENDENT_AMBULATORY_CARE_PROVIDER_SITE_OTHER): Payer: Self-pay | Admitting: Surgery

## 2011-07-22 ENCOUNTER — Observation Stay (HOSPITAL_COMMUNITY): Payer: Medicaid Other | Admitting: Anesthesiology

## 2011-07-22 ENCOUNTER — Encounter (HOSPITAL_COMMUNITY): Payer: Self-pay | Admitting: Anesthesiology

## 2011-07-22 ENCOUNTER — Observation Stay (HOSPITAL_COMMUNITY): Payer: Medicaid Other

## 2011-07-22 ENCOUNTER — Encounter (HOSPITAL_COMMUNITY): Admission: EM | Disposition: A | Discharge status: Home / Self Care | Payer: Self-pay | Source: Home / Self Care

## 2011-07-22 DIAGNOSIS — R1013 Epigastric pain: Secondary | ICD-10-CM

## 2011-07-22 DIAGNOSIS — R1011 Right upper quadrant pain: Secondary | ICD-10-CM

## 2011-07-22 DIAGNOSIS — K8010 Calculus of gallbladder with chronic cholecystitis without obstruction: Secondary | ICD-10-CM

## 2011-07-22 DIAGNOSIS — R7401 Elevation of levels of liver transaminase levels: Secondary | ICD-10-CM

## 2011-07-22 DIAGNOSIS — R932 Abnormal findings on diagnostic imaging of liver and biliary tract: Secondary | ICD-10-CM

## 2011-07-22 DIAGNOSIS — R740 Nonspecific elevation of levels of transaminase and lactic acid dehydrogenase [LDH]: Secondary | ICD-10-CM

## 2011-07-22 DIAGNOSIS — K831 Obstruction of bile duct: Secondary | ICD-10-CM

## 2011-07-22 DIAGNOSIS — R7402 Elevation of levels of lactic acid dehydrogenase (LDH): Secondary | ICD-10-CM

## 2011-07-22 DIAGNOSIS — K8042 Calculus of bile duct with acute cholecystitis without obstruction: Secondary | ICD-10-CM | POA: Diagnosis present

## 2011-07-22 DIAGNOSIS — R112 Nausea with vomiting, unspecified: Secondary | ICD-10-CM

## 2011-07-22 HISTORY — PX: CHOLECYSTECTOMY: SHX55

## 2011-07-22 HISTORY — DX: Morbid (severe) obesity due to excess calories: E66.01

## 2011-07-22 LAB — GLUCOSE, CAPILLARY
Glucose-Capillary: 91 mg/dL (ref 70–99)
Glucose-Capillary: 189 mg/dL — ABNORMAL HIGH (ref 70–99)
Glucose-Capillary: 117 mg/dL — ABNORMAL HIGH (ref 70–99)
Glucose-Capillary: 101 mg/dL — ABNORMAL HIGH (ref 70–99)
Glucose-Capillary: 118 mg/dL — ABNORMAL HIGH (ref 70–99)

## 2011-07-22 LAB — MRSA PCR SCREENING: MRSA by PCR: NEGATIVE

## 2011-07-22 SURGERY — LAPAROSCOPIC CHOLECYSTECTOMY WITH INTRAOPERATIVE CHOLANGIOGRAM
Anesthesia: General | Site: Abdomen | Wound class: Clean Contaminated

## 2011-07-22 MED ORDER — BUPIVACAINE HCL (PF) 0.25 % IJ SOLN
INTRAMUSCULAR | Status: AC
  Filled 2011-07-22: qty 30

## 2011-07-22 MED ORDER — DIPHENHYDRAMINE HCL 50 MG/ML IJ SOLN
25.0000 mg | Freq: Once | INTRAMUSCULAR | Status: AC
  Administered 2011-07-22: 25 mg via INTRAVENOUS

## 2011-07-22 MED ORDER — HYDROMORPHONE HCL PF 1 MG/ML IJ SOLN
INTRAMUSCULAR | Status: AC
  Filled 2011-07-22: qty 1

## 2011-07-22 MED ORDER — HYDROMORPHONE HCL PF 1 MG/ML IJ SOLN
1.0000 mg | INTRAMUSCULAR | Status: DC | PRN
  Filled 2011-07-22: qty 1

## 2011-07-22 MED ORDER — ZOLPIDEM TARTRATE 5 MG PO TABS: 5.0000 mg | ORAL_TABLET | Freq: Every evening | ORAL | Status: DC | PRN

## 2011-07-22 MED ORDER — ONDANSETRON HCL 4 MG PO TABS: 4.0000 mg | ORAL_TABLET | Freq: Four times a day (QID) | ORAL | Status: DC | PRN

## 2011-07-22 MED ORDER — BISACODYL 10 MG RE SUPP: 10.0000 mg | Freq: Two times a day (BID) | RECTAL | Status: DC | PRN

## 2011-07-22 MED ORDER — MIDAZOLAM HCL 5 MG/5ML IJ SOLN
INTRAMUSCULAR | Status: DC | PRN
  Administered 2011-07-22: 2 mg via INTRAVENOUS

## 2011-07-22 MED ORDER — OXYCODONE HCL 5 MG PO TABS
5.0000 mg | ORAL_TABLET | ORAL | Status: DC | PRN
  Administered 2011-07-22 – 2011-07-25 (×3): 10 mg via ORAL
  Filled 2011-07-22 (×4): qty 2

## 2011-07-22 MED ORDER — LIP MEDEX EX OINT
1.0000 "application " | TOPICAL_OINTMENT | Freq: Two times a day (BID) | CUTANEOUS | Status: DC
  Administered 2011-07-22 – 2011-07-25 (×7): 1 via TOPICAL
  Filled 2011-07-22: qty 7

## 2011-07-22 MED ORDER — ROCURONIUM BROMIDE 100 MG/10ML IV SOLN
INTRAVENOUS | Status: DC | PRN
  Administered 2011-07-22: 5 mg via INTRAVENOUS
  Administered 2011-07-22: 25 mg via INTRAVENOUS
  Administered 2011-07-22: 5 mg via INTRAVENOUS

## 2011-07-22 MED ORDER — POTASSIUM CHLORIDE IN NACL 20-0.9 MEQ/L-% IV SOLN
INTRAVENOUS | Status: DC
  Filled 2011-07-22 (×3): qty 1000

## 2011-07-22 MED ORDER — BUPIVACAINE HCL (PF) 0.25 % IJ SOLN
INTRAMUSCULAR | Status: DC | PRN
  Administered 2011-07-22: 30 mL

## 2011-07-22 MED ORDER — MORPHINE SULFATE 2 MG/ML IJ SOLN
INTRAMUSCULAR | Status: AC
  Administered 2011-07-22: 2 mg via INTRAVENOUS
  Filled 2011-07-22: qty 1

## 2011-07-22 MED ORDER — METOPROLOL TARTRATE 1 MG/ML IV SOLN
5.0000 mg | Freq: Four times a day (QID) | INTRAVENOUS | Status: DC | PRN
  Administered 2011-07-22 – 2011-07-23 (×2): 5 mg via INTRAVENOUS
  Filled 2011-07-22 (×2): qty 5

## 2011-07-22 MED ORDER — PROPOFOL 10 MG/ML IV EMUL
INTRAVENOUS | Status: DC | PRN
  Administered 2011-07-22: 200 mg via INTRAVENOUS

## 2011-07-22 MED ORDER — NEOSTIGMINE METHYLSULFATE 1 MG/ML IJ SOLN
INTRAMUSCULAR | Status: DC | PRN
  Administered 2011-07-22: 5 mg via INTRAVENOUS

## 2011-07-22 MED ORDER — ACETAMINOPHEN 325 MG PO TABS: 325.0000 mg | ORAL_TABLET | Freq: Four times a day (QID) | ORAL | Status: DC | PRN

## 2011-07-22 MED ORDER — PROPRANOLOL HCL 10 MG PO TABS
10.0000 mg | ORAL_TABLET | Freq: Two times a day (BID) | ORAL | Status: DC
  Administered 2011-07-22 – 2011-07-25 (×7): 10 mg via ORAL
  Filled 2011-07-22 (×10): qty 1

## 2011-07-22 MED ORDER — INSULIN ASPART 100 UNIT/ML ~~LOC~~ SOLN
0.0000 [IU] | SUBCUTANEOUS | Status: DC
  Administered 2011-07-22: 3 [IU] via SUBCUTANEOUS
  Filled 2011-07-22: qty 3

## 2011-07-22 MED ORDER — DIPHENHYDRAMINE HCL 50 MG/ML IJ SOLN
INTRAMUSCULAR | Status: AC
  Filled 2011-07-22: qty 1

## 2011-07-22 MED ORDER — NALOXONE HCL 0.4 MG/ML IJ SOLN: 0.4000 mg | INTRAMUSCULAR | Status: DC | PRN

## 2011-07-22 MED ORDER — FENTANYL CITRATE 0.05 MG/ML IJ SOLN
INTRAMUSCULAR | Status: DC | PRN
  Administered 2011-07-22: 100 ug via INTRAVENOUS
  Administered 2011-07-22 (×3): 50 ug via INTRAVENOUS

## 2011-07-22 MED ORDER — INSULIN ASPART 100 UNIT/ML ~~LOC~~ SOLN
SUBCUTANEOUS | Status: AC
  Filled 2011-07-22: qty 1

## 2011-07-22 MED ORDER — SODIUM CHLORIDE 0.9 % IV SOLN
INTRAVENOUS | Status: DC
  Administered 2011-07-22: via INTRAVENOUS

## 2011-07-22 MED ORDER — SODIUM CHLORIDE 0.9 % IJ SOLN: 9.0000 mL | INTRAMUSCULAR | Status: DC | PRN

## 2011-07-22 MED ORDER — PROMETHAZINE HCL 25 MG/ML IJ SOLN
6.2500 mg | INTRAMUSCULAR | Status: AC | PRN
  Administered 2011-07-22 (×2): 6.25 mg via INTRAVENOUS
  Filled 2011-07-22: qty 1

## 2011-07-22 MED ORDER — ONDANSETRON 4 MG PO TBDP
4.0000 mg | ORAL_TABLET | Freq: Four times a day (QID) | ORAL | Status: DC | PRN
  Administered 2011-07-23: 8 mg via ORAL
  Filled 2011-07-22: qty 2

## 2011-07-22 MED ORDER — HYDROXYZINE HCL 10 MG PO TABS
10.0000 mg | ORAL_TABLET | Freq: Four times a day (QID) | ORAL | Status: DC | PRN
  Filled 2011-07-22: qty 3

## 2011-07-22 MED ORDER — CIPROFLOXACIN IN D5W 400 MG/200ML IV SOLN
400.0000 mg | Freq: Two times a day (BID) | INTRAVENOUS | Status: DC
  Administered 2011-07-22 – 2011-07-25 (×6): 400 mg via INTRAVENOUS
  Filled 2011-07-22 (×8): qty 200

## 2011-07-22 MED ORDER — CIPROFLOXACIN IN D5W 400 MG/200ML IV SOLN
INTRAVENOUS | Status: AC
  Filled 2011-07-22: qty 200

## 2011-07-22 MED ORDER — ASPIRIN-ACETAMINOPHEN-CAFFEINE 250-250-65 MG PO TABS
1.0000 | ORAL_TABLET | Freq: Three times a day (TID) | ORAL | Status: DC | PRN
  Filled 2011-07-22: qty 1

## 2011-07-22 MED ORDER — ONDANSETRON HCL 4 MG/2ML IJ SOLN: 4.0000 mg | Freq: Four times a day (QID) | INTRAMUSCULAR | Status: DC | PRN

## 2011-07-22 MED ORDER — LACTATED RINGERS IV SOLN
INTRAVENOUS | Status: DC | PRN
  Administered 2011-07-22: 07:00:00 via INTRAVENOUS

## 2011-07-22 MED ORDER — SUCCINYLCHOLINE CHLORIDE 20 MG/ML IJ SOLN
INTRAMUSCULAR | Status: DC | PRN
  Administered 2011-07-22: 100 mg via INTRAVENOUS

## 2011-07-22 MED ORDER — CIPROFLOXACIN IN D5W 400 MG/200ML IV SOLN
INTRAVENOUS | Status: DC | PRN
  Administered 2011-07-22: 400 mg via INTRAVENOUS

## 2011-07-22 MED ORDER — MORPHINE SULFATE 2 MG/ML IJ SOLN
1.0000 mg | INTRAMUSCULAR | Status: DC | PRN
  Administered 2011-07-22: 2 mg via INTRAVENOUS

## 2011-07-22 MED ORDER — LISINOPRIL 10 MG PO TABS
10.0000 mg | ORAL_TABLET | Freq: Every day | ORAL | Status: DC
  Administered 2011-07-22 – 2011-07-25 (×4): 10 mg via ORAL
  Filled 2011-07-22 (×6): qty 1

## 2011-07-22 MED ORDER — NAPROXEN 500 MG PO TABS
500.0000 mg | ORAL_TABLET | Freq: Two times a day (BID) | ORAL | Status: DC
  Filled 2011-07-22 (×3): qty 1

## 2011-07-22 MED ORDER — MAGIC MOUTHWASH
15.0000 mL | Freq: Four times a day (QID) | ORAL | Status: DC | PRN
  Administered 2011-07-25: 15 mL via ORAL
  Filled 2011-07-22 (×2): qty 15

## 2011-07-22 MED ORDER — FLORA-Q PO CAPS
1.0000 | ORAL_CAPSULE | Freq: Every day | ORAL | Status: DC
  Administered 2011-07-22 – 2011-07-25 (×4): 1 via ORAL
  Filled 2011-07-22 (×6): qty 1

## 2011-07-22 MED ORDER — DIPHENHYDRAMINE HCL 25 MG PO CAPS: 25.0000 mg | ORAL_CAPSULE | Freq: Four times a day (QID) | ORAL | Status: DC | PRN

## 2011-07-22 MED ORDER — ONDANSETRON HCL 4 MG/2ML IJ SOLN
INTRAMUSCULAR | Status: DC | PRN
  Administered 2011-07-22: 4 mg via INTRAVENOUS

## 2011-07-22 MED ORDER — ONDANSETRON HCL 4 MG/2ML IJ SOLN
4.0000 mg | Freq: Four times a day (QID) | INTRAMUSCULAR | Status: DC | PRN
  Administered 2011-07-23 – 2011-07-24 (×2): 4 mg via INTRAVENOUS
  Filled 2011-07-22 (×2): qty 2

## 2011-07-22 MED ORDER — PROMETHAZINE HCL 25 MG/ML IJ SOLN
INTRAMUSCULAR | Status: AC
  Administered 2011-07-23: 12.5 mg via INTRAVENOUS
  Filled 2011-07-22: qty 1

## 2011-07-22 MED ORDER — PNEUMOCOCCAL VAC POLYVALENT 25 MCG/0.5ML IJ INJ
0.5000 mL | INJECTION | INTRAMUSCULAR | Status: DC
  Filled 2011-07-22: qty 0.5

## 2011-07-22 MED ORDER — INFLUENZA VIRUS VACC SPLIT PF IM SUSP
0.5000 mL | INTRAMUSCULAR | Status: DC
  Filled 2011-07-22: qty 0.5

## 2011-07-22 MED ORDER — METOCLOPRAMIDE HCL 5 MG/ML IJ SOLN
5.0000 mg | Freq: Four times a day (QID) | INTRAMUSCULAR | Status: DC | PRN
  Filled 2011-07-22: qty 2

## 2011-07-22 MED ORDER — LIDOCAINE HCL (CARDIAC) 20 MG/ML IV SOLN
INTRAVENOUS | Status: DC | PRN
  Administered 2011-07-22: 50 mg via INTRAVENOUS

## 2011-07-22 MED ORDER — LACTATED RINGERS IV SOLN
INTRAVENOUS | Status: DC
  Administered 2011-07-23 – 2011-07-24 (×3): via INTRAVENOUS
  Administered 2011-07-24: 20 mL via INTRAVENOUS

## 2011-07-22 MED ORDER — GUAIFENESIN-DM 100-10 MG/5ML PO SYRP: 15.0000 mL | ORAL_SOLUTION | ORAL | Status: DC | PRN

## 2011-07-22 MED ORDER — POTASSIUM CHLORIDE IN NACL 20-0.45 MEQ/L-% IV SOLN
INTRAVENOUS | Status: DC
  Filled 2011-07-22 (×3): qty 1000

## 2011-07-22 MED ORDER — HYDROMORPHONE HCL PF 1 MG/ML IJ SOLN
0.2500 mg | INTRAMUSCULAR | Status: DC | PRN
  Administered 2011-07-22 (×4): 0.5 mg via INTRAVENOUS

## 2011-07-22 MED ORDER — ACETAMINOPHEN 650 MG RE SUPP: 650.0000 mg | Freq: Four times a day (QID) | RECTAL | Status: DC | PRN

## 2011-07-22 MED ORDER — DIPHENHYDRAMINE HCL 50 MG/ML IJ SOLN
12.5000 mg | Freq: Four times a day (QID) | INTRAMUSCULAR | Status: DC | PRN
  Administered 2011-07-22: 12.5 mg via INTRAVENOUS
  Administered 2011-07-25: 25 mg via INTRAVENOUS
  Filled 2011-07-22 (×2): qty 1

## 2011-07-22 MED ORDER — HYDROMORPHONE BOLUS VIA INFUSION
0.5000 mg | INTRAVENOUS | Status: DC | PRN
  Filled 2011-07-22: qty 200

## 2011-07-22 MED ORDER — ALUM & MAG HYDROXIDE-SIMETH 200-200-20 MG/5ML PO SUSP
30.0000 mL | Freq: Four times a day (QID) | ORAL | Status: DC | PRN
Start: 2011-07-22 — End: 2011-07-25
  Administered 2011-07-24 – 2011-07-25 (×2): 30 mL via ORAL
  Filled 2011-07-22 (×3): qty 30

## 2011-07-22 MED ORDER — FENTANYL 10 MCG/ML IV SOLN
INTRAVENOUS | Status: DC
  Administered 2011-07-22: 45 ug via INTRAVENOUS
  Administered 2011-07-22: 16:00:00 via INTRAVENOUS
  Administered 2011-07-23: 90 ug via INTRAVENOUS
  Administered 2011-07-23: 60 ug via INTRAVENOUS
  Administered 2011-07-23: 30 ug via INTRAVENOUS
  Administered 2011-07-23: 15 ug via INTRAVENOUS
  Administered 2011-07-23: 90 ug via INTRAVENOUS
  Administered 2011-07-23: 60 ug via INTRAVENOUS
  Administered 2011-07-24: 30 ug via INTRAVENOUS
  Administered 2011-07-24: 15 ug/h via INTRAVENOUS
  Administered 2011-07-24: 30 ug/h via INTRAVENOUS
  Administered 2011-07-24: 90 ug via INTRAVENOUS
  Administered 2011-07-24 (×2): via INTRAVENOUS
  Administered 2011-07-25: 120 ug via INTRAVENOUS
  Administered 2011-07-25: 60 ug via INTRAVENOUS
  Administered 2011-07-25: 90 ug via INTRAVENOUS
  Filled 2011-07-22 (×2): qty 50

## 2011-07-22 MED ORDER — DIATRIZOATE MEGLUMINE 30 % UR SOLN
URETHRAL | Status: DC | PRN
  Administered 2011-07-22: 50 mL

## 2011-07-22 MED ORDER — FENTANYL BOLUS VIA INFUSION
20.0000 ug | INTRAVENOUS | Status: DC | PRN
  Filled 2011-07-22: qty 50

## 2011-07-22 MED ORDER — GLUCAGON HCL (RDNA) 1 MG IJ SOLR
INTRAMUSCULAR | Status: AC
  Filled 2011-07-22: qty 1

## 2011-07-22 MED ORDER — GLYCOPYRROLATE 0.2 MG/ML IJ SOLN
INTRAMUSCULAR | Status: DC | PRN
  Administered 2011-07-22: .6 mg via INTRAVENOUS

## 2011-07-22 MED ORDER — LABETALOL HCL 5 MG/ML IV SOLN
INTRAVENOUS | Status: DC | PRN
  Administered 2011-07-22 (×2): 2.5 mg via INTRAVENOUS

## 2011-07-22 MED ORDER — INFLUENZA VIRUS VACC SPLIT PF IM SUSP: 0.5000 mL | INTRAMUSCULAR | Status: DC

## 2011-07-22 MED ORDER — GLUCAGON HCL (RDNA) 1 MG IJ SOLR
INTRAMUSCULAR | Status: DC | PRN
  Administered 2011-07-22: 1 mg via INTRAVENOUS

## 2011-07-22 MED ORDER — IOHEXOL 300 MG/ML  SOLN
INTRAMUSCULAR | Status: AC
  Filled 2011-07-22: qty 1

## 2011-07-22 SURGICAL SUPPLY — 47 items
APPLIER CLIP ROT 10 11.4 M/L (STAPLE) ×2
BENZOIN TINCTURE PRP APPL 2/3 (GAUZE/BANDAGES/DRESSINGS) IMPLANT
BIT DRILL AO MATTA 3.2MX230M (BIT) IMPLANT
CABLE HI FREQUENCY MONOPOLAR (ELECTROSURGICAL) ×2 IMPLANT
CANISTER SUCTION 2500CC (MISCELLANEOUS) ×2 IMPLANT
CATH EMB 4FR 80CM (CATHETERS) ×2 IMPLANT
CHLORAPREP W/TINT 26ML (MISCELLANEOUS) ×2 IMPLANT
CHOLANGIOGRAM CATH TAUT (CATHETERS) ×2 IMPLANT
CLIP APPLIE ROT 10 11.4 M/L (STAPLE) ×1 IMPLANT
CLOTH BEACON ORANGE TIMEOUT ST (SAFETY) ×2 IMPLANT
COVER MAYO STAND STRL (DRAPES) ×2 IMPLANT
DECANTER SPIKE VIAL GLASS SM (MISCELLANEOUS) ×2 IMPLANT
DERMABOND ADVANCED (GAUZE/BANDAGES/DRESSINGS) ×1
DERMABOND ADVANCED .7 DNX12 (GAUZE/BANDAGES/DRESSINGS) ×1 IMPLANT
DRAPE C-ARM 42X72 X-RAY (DRAPES) ×2 IMPLANT
DRAPE LAPAROSCOPIC ABDOMINAL (DRAPES) ×2 IMPLANT
DRAPE UTILITY XL STRL (DRAPES) ×2 IMPLANT
DRILL BIT AO MATTA 3.2MX230M (BIT)
ELECT REM PT RETURN 9FT ADLT (ELECTROSURGICAL) ×2
ELECTRODE REM PT RTRN 9FT ADLT (ELECTROSURGICAL) ×1 IMPLANT
GLOVE BIOGEL PI IND STRL 7.0 (GLOVE) ×1 IMPLANT
GLOVE BIOGEL PI INDICATOR 7.0 (GLOVE) ×1
GLOVE SURG SIGNA 7.5 PF LTX (GLOVE) ×2 IMPLANT
GOWN STRL NON-REIN LRG LVL3 (GOWN DISPOSABLE) ×2 IMPLANT
GOWN STRL REIN XL XLG (GOWN DISPOSABLE) ×4 IMPLANT
HEMOSTAT SURGICEL 4X8 (HEMOSTASIS) IMPLANT
HOVERMATT SINGLE USE (MISCELLANEOUS) ×2 IMPLANT
IV CATH 14GX2 1/4 (CATHETERS) ×2 IMPLANT
IV SET EXT 30 76VOL 4 MALE LL (IV SETS) ×2 IMPLANT
KIT BASIN OR (CUSTOM PROCEDURE TRAY) ×2 IMPLANT
NS IRRIG 1000ML POUR BTL (IV SOLUTION) ×2 IMPLANT
POUCH SPECIMEN RETRIEVAL 10MM (ENDOMECHANICALS) ×2 IMPLANT
SET IRRIG TUBING LAPAROSCOPIC (IRRIGATION / IRRIGATOR) ×2 IMPLANT
SLEEVE Z-THREAD 5X100MM (TROCAR) ×2 IMPLANT
SOLUTION ANTI FOG 6CC (MISCELLANEOUS) ×2 IMPLANT
STOPCOCK K 69 2C6206 (IV SETS) ×2 IMPLANT
STRIP CLOSURE SKIN 1/4X4 (GAUZE/BANDAGES/DRESSINGS) IMPLANT
SUT VIC AB 5-0 PS2 18 (SUTURE) ×2 IMPLANT
SYR 27GX1/2 1ML LL SAFETY (SYRINGE) ×2 IMPLANT
TOWEL OR 17X26 10 PK STRL BLUE (TOWEL DISPOSABLE) ×4 IMPLANT
TRAY LAP CHOLE (CUSTOM PROCEDURE TRAY) ×2 IMPLANT
TROCAR XCEL BLUNT TIP 100MML (ENDOMECHANICALS) ×2 IMPLANT
TROCAR Z-THREAD FIOS 11X100 BL (TROCAR) ×2 IMPLANT
TROCAR Z-THREAD FIOS 5X100MM (TROCAR) ×2 IMPLANT
TROCAR Z-THREAD SLEEVE 11X100 (TROCAR) ×2 IMPLANT
TUBING INSUFFLATION 10FT LAP (TUBING) ×2 IMPLANT
WATER STERILE IRR 1500ML POUR (IV SOLUTION) IMPLANT

## 2011-07-22 NOTE — Transfer of Care (Signed)
Immediate Anesthesia Transfer of Care Note  Patient: Maria Carpenter  Procedure(s) Performed:  LAPAROSCOPIC CHOLECYSTECTOMY WITH INTRAOPERATIVE CHOLANGIOGRAM  Patient Location: PACU  Anesthesia Type: General  Level of Consciousness: awake and oriented  Airway & Oxygen Therapy: Patient Spontanous Breathing and Patient connected to face mask oxygen  Post-op Assessment: Report given to PACU RN, Post -op Vital signs reviewed and stable and Patient moving all extremities X 4  Post vital signs: stable  Complications: No apparent anesthesia complications

## 2011-07-22 NOTE — ED Provider Notes (Signed)
Medical screening examination/treatment/procedure(s) were performed by non-physician practitioner and as supervising physician I was immediately available for consultation/collaboration.   Lyanne Co, MD 07/22/11 [phone]

## 2011-07-22 NOTE — ED Notes (Signed)
Awaiting bed to be cleaned upstairs. Spoke with Toma Copier, RN on floor.

## 2011-07-22 NOTE — Anesthesia Postprocedure Evaluation (Signed)
  Anesthesia Post-op Note  Patient: Maria Carpenter  Procedure(s) Performed:  LAPAROSCOPIC CHOLECYSTECTOMY WITH INTRAOPERATIVE CHOLANGIOGRAM  Patient Location: PACU  Anesthesia Type: General  Level of Consciousness: oriented and sedated  Airway and Oxygen Therapy: Patient Spontanous Breathing and Patient connected to nasal cannula oxygen  Post-op Pain: mild  Post-op Assessment: Post-op Vital signs reviewed, Patient's Cardiovascular Status Stable, Respiratory Function Stable and Patent Airway  Post-op Vital Signs: stable  Complications: No apparent anesthesia complications

## 2011-07-22 NOTE — Op Note (Signed)
NAMEELANOR, CALE NO.:  [id number]  MEDICAL RECORD NO.:  [id number]  LOCATION:  1318                         FACILITY:  [facility]  PHYSICIAN:  Sandria Bales. Ezzard Standing, M.D.  DATE OF BIRTH:  April 21, 1981  DATE OF PROCEDURE:  07/22/2011                              OPERATIVE REPORT  PREOPERATIVE DIAGNOSES:  Chronic cholecystitis, cholelithiasis.  POSTOPERATIVE DIAGNOSES:  Chronic cholecystitis, cholelithiasis, Choledocholithiasis.  CBD did not empty on cholangiogram.  PROCEDURE:  Laparoscopic cholecystectomy with intraoperative cholangiogram, and common bile duct exploration using #4 biliary Fogarty.  SURGEON:  Sandria Bales. Ezzard Standing, MD  FIRST ASSISTANT:  Ardeth Sportsman, MD  ANESTHESIA:  General endotracheal.  Supervised by Jill Side, MD  ESTIMATED BLOOD LOSS:  Minimal.  INDICATION OF PROCEDURE:  Ms. Petros is a 30 year old African American female, who has no identified primary medical doctor, who has had known gallstones for at least 3 years.  She has had repeated bouts of pain, but has not sought medical attention because of finances.  She came to the [facility] Emergency Room with right upper quadrant pain, elevated liver enzymes, and ultrasound showing gallstone.  I have discussed with her about proceeding with cholecystectomy.  I discussed with her the indications, potential complications of gallbladder surgery.  The potential complications of gallbladder surgery include, but are not limited to, bleeding, infection, common bile duct injury, the possibility of open surgery, and nerve injury.  OPERATIVE NOTE:  The patient placed in the supine position in room #1. Dr. Lestine Box is the supervising anesthesiologist.  Her abdomen was prepped with ChloraPrep and sterilely draped.  A time-out was held and surgical checklist run.  I accessed her abdominal cavity through an infraumbilical incision with sharp dissection carried down to the abdominal  cavity.  A 0 degree 10 mm laparoscope was inserted through a 12 mm Hasson trocar.  The Hasson trocar was secured with a 0 Vicryl suture.  I then changed from a 0 degree to an angled scope, I placed an 11 mm in the subxiphoid location, a 5 mm in the right mid subcostal, and a 5 mm in the lateral subcostal location.  Abdominal exploration revealed the stomach to be normal.  The liver looked normal.  The bowel that I could see was normal.  She had some changes of her gallbladder with a white wall consistent with some chronic cholecystitis and grabbed the gallbladder, rotated it cephalad. I dissected out the cystic duct, cystic artery and shot an intraoperative cholangiogram.  The intraoperative cholangiogram was shot using a cutoff taut catheter inserted through a 14-gauge Jelco catheter and the taut catheter was secured in the side of the cut cystic duct with an EndoClip.  I used about 10 mL of half-strength Hypaque solution in the first shot.  This showed free flow of contrast down the cystic duct into the common bile duct and went up the hepatic radicals, but the contrast never got out of the distal common bile duct, nor did it empty.  I then removed the taut catheter, manipulated the common bile duct, doing a common bile duct exploration with a #4 biliary Fogarty, but I was unable to advance the Fogarty into the duodenum.  I tried to advance the taut catheter with the same method and again could not pass it into the duodenum.  We gave 1 mg of glucagon, waited 2 minutes, shot another cholangiogram under fluoro. I then shot another cholangiogram with half-strength Hypaque about 10 mL.  This showed better visualization of the distal common bile duct. There was still no emptying into the duodenum.  I think this is consistent with a probable impacted distal common bile duct stone.  I then removed the taut catheter.  I clipped the cystic duct with 3 clips and clipped the cystic artery with  3 clips and then sharply and bluntly dissected the gallbladder from the gallbladder bed.  After complete division of the gallbladder from the gallbladder bed, I revisualized the triangle of Calot, I revisualized the gallbladder bed. There was no bleeding, no bile leak.  The gallbladder was pulled through the umbilicus after placing in an EndoCatch bag.  I then removed each trocar.  I closed the umbilical port with a 3-0 Vicryl suture, the skin at each port with a 5-0 Vicryl suture, painted the wound with Dermabond.  I spoke with Dr. Lina Sar about, from a GI standpoint to see her for consideration of ERCP.   Sandria Bales. Ezzard Standing, M.D., FACS   DHN/MEDQ  D:  07/22/2011  T:  07/22/2011  Job:  [id number]  cc:   Hedwig Morton. Juanda Chance, MD [address] [place] [address]

## 2011-07-22 NOTE — ED Provider Notes (Signed)
Medical screening examination/treatment/procedure(s) were performed by non-physician practitioner and as supervising physician I was immediately available for consultation/collaboration.   Alaina Donati M Beatriz Settles, MD 07/22/11 0116 

## 2011-07-22 NOTE — ED Notes (Signed)
Patient is resting comfortably. 

## 2011-07-22 NOTE — Anesthesia Preprocedure Evaluation (Signed)
Anesthesia Evaluation  Patient identified by MRN, date of birth, ID band Patient awake    Reviewed: Allergy & Precautions, H&P , NPO status , Patient's Chart, lab work & pertinent test results, reviewed documented beta blocker date and time   Airway Mallampati: II TM Distance: >3 FB Neck ROM: Full    Dental   Pulmonary asthma ,  Inhaler prn clear to auscultation        Cardiovascular hypertension, Pt. on medications Regular Normal Hx SVT, Rx Inderal   Neuro/Psych CVA, March 2012, Rt sided deficits. CVA Negative Psych ROS   GI/Hepatic negative GI ROS, Neg liver ROS,   Endo/Other  Diabetes mellitus-, Well ControlledMorbid obesityDiet controlled per pt. Type II per chart  Renal/GU negative Renal ROS  Genitourinary negative   Musculoskeletal negative musculoskeletal ROS (+)   Abdominal   Peds negative pediatric ROS (+)  Hematology negative hematology ROS (+)   Anesthesia Other Findings   Reproductive/Obstetrics negative OB ROS                           Anesthesia Physical Anesthesia Plan  ASA: III  Anesthesia Plan: General   Post-op Pain Management:    Induction: Intravenous  Airway Management Planned: Oral ETT  Additional Equipment:   Intra-op Plan:   Post-operative Plan: Extubation in OR  Informed Consent: I have reviewed the patients History and Physical, chart, labs and discussed the procedure including the risks, benefits and alternatives for the proposed anesthesia with the patient or authorized representative who has indicated his/her understanding and acceptance.   Dental advisory given  Plan Discussed with: CRNA and Surgeon  Anesthesia Plan Comments:         Anesthesia Quick Evaluation

## 2011-07-22 NOTE — Brief Op Note (Signed)
07/21/2011 - 07/22/2011  9:14 AM  PATIENT:  Maria Carpenter, 30 y.o., female, MRN: [id number]  PREOP DIAGNOSIS:  gallbladder disease  POSTOP DIAGNOSIS:   Chronic cholecystitis, cholelithiasis, choledocholithiasis.  PROCEDURE:   Procedure(s): LAPAROSCOPIC CHOLECYSTECTOMY WITH INTRAOPERATIVE CHOLANGIOGRAM, exploration of CDB with #4 biliary Fogarty. [I discussed the findings with and requested consultation with Dr. Algis Downs. Brodie.]  SURGEON:   Ovidio Kin, M.D.  ASSISTANTMichaell Cowing  ANESTHESIA:   general  EBL:  min  ml     BLOOD ADMINISTERED: none  DRAINS: none   LOCAL MEDICATIONS USED:   30 cc 1/4% marcaine  SPECIMEN:   Gall bladder  COUNTS CORRECT:  YES  INDICATIONS FOR PROCEDURE:  Maria Carpenter is a 31 y.o. (DOB: 1980-10-03) AA female whose primary care physician is No primary provider on file. and comes for cholecystectomy and IOC.   The indications and risks of the surgery were explained to the patient.  The risks include, but are not limited to, infection, bleeding, and nerve injury.  Note dictated to:  #[id number]  07/22/2011  DN

## 2011-07-22 NOTE — ED Notes (Signed)
Family at bedside. 

## 2011-07-22 NOTE — Consult Note (Signed)
Referring Provider: Dr. Ezzard Standing Primary Care Physician:  Primary Gastroenterologist:  Gentry Fitz.  Reason for Consultation:  Probable common bile duct stone  HPI: Maria Carpenter is a 30 y.o. female with history of morbid obesity, hypertension history of SVT and rheumatoid arthritis who was admitted yesterday with acute epigastric pain. She says she has been having intermittent pain nausea and vomiting over the past 3 years since her last child was born. Up upper abdominal ultrasound showed a large gallstone in the gallbladder no wall thickening and a common bile duct of 6 mm. She is also noted to have increased echogenicity of the liver Patient underwent laparoscopic cholecystectomy today with IOC and there was no passage of dye into the duodenum, as well as fullness of the bile duct and therefore concern for a retained common bile duct stone. Liver function studies done yesterday were elevated and outlined below . Patient is awake postop but still sleepy and limited discussion was had regarding need for ERCP to rule out a common bile duct stone.   Past Medical History  Diagnosis Date  . Diabetes mellitus     A2DM during pregnancy. now on metformin.   [name] Hypertension   . Stroke 2012  . SVT (supraventricular tachycardia)     on propanalol  . Gonorrhea 2010    treated  . Chlamydia 2012    treated  . Pregnancy induced hypertension     pre E with pregnancies  . Asthma   . Rheumatoid arthritis     Past Surgical History  Procedure Date  . Tubal ligation 2009  . Finger surgery 1993    left ring finger to correct bone "problem"    Prior to Admission medications   Medication Sig Start Date End Date Taking? Authorizing Provider  acetaminophen (TYLENOL) 500 MG tablet Take 500-1,000 mg by mouth every 6 (six) hours as needed. For pain/headache.    Yes Historical Provider, MD  aspirin 81 MG EC tablet Take 81 mg by mouth daily.     Yes Historical Provider, MD  famotidine (PEPCID) 20 MG  tablet Take 20 mg by mouth 2 (two) times daily.     Yes Historical Provider, MD  hydrochlorothiazide (HYDRODIURIL) 25 MG tablet Take 25 mg by mouth daily.     Yes Historical Provider, MD  lisinopril (PRINIVIL,ZESTRIL) 10 MG tablet Take 10 mg by mouth daily.     Yes Historical Provider, MD  Multiple Vitamins-Minerals (MULTIVITAMIN WITH MINERALS) tablet Take 1 tablet by mouth daily.     Yes Historical Provider, MD  propranolol (INDERAL) 10 MG tablet Take 1 tablet (10 mg total) by mouth 2 (two) times daily. 04/10/11   Wynelle Bourgeois, CNM  simvastatin (ZOCOR) 10 MG tablet Take 10 mg by mouth at bedtime.      Historical Provider, MD      Allergies as of 07/21/2011 - Review Complete 07/21/2011  Allergen Reaction Noted  . Amoxicillin Anaphylaxis and Rash 04/10/2011    Family History  Problem Relation Age of Onset  . Anesthesia problems Mother     difficult to arouse, SOB    History   Social History  . Marital Status: Married    Spouse Name: N/A    Number of Children: N/A  . Years of Education: N/A   Occupational History  . Not on file.   Social History Main Topics  . Smoking status: Current Everyday Smoker -- 0.2 packs/day for 4 years    Types: Cigarettes  . Smokeless tobacco: Not on file  .  Alcohol Use: Yes     socially  . Drug Use: No  . Sexually Active: Yes    Birth Control/ Protection: Other-see comments   Other Topics Concern  . Not on file   Social History Narrative  . No narrative on file    Review of Systems: Pertinent positive and negative review of systems were noted in the above HPI section.  All other review of systems was otherwise negative.  Physical Exam: Vital signs in last 24 hours: Temp:  [97.5 F (36.4 C)-98.8 F (37.1 C)] 97.9 F (36.6 C) (11/25 1021) Pulse Rate:  [59-88] 64  (11/25 1021) Resp:  [15-20] 15  (11/25 1021) BP: (123-170)/(56-100) 153/100 mmHg (11/25 1021) SpO2:  [2 %-100 %] 2 % (11/25 1021) Weight:  [159 kg (350 lb 8.5 oz)] 350 lb  8.5 oz (159 kg) (11/25 0509)   General:   Alert,  Well-developed, obese, pleasant and cooperative in NAD, still sleepy Head:  Normocephalic and atraumatic. Eyes:  Sclera clear, no icterus.   Conjunctiva pink. . Nose:  No deformity, discharge,  or lesions. Mouth:  No deformity or lesions.  Oropharynx pink & moist. Neck:  Supple; no masses or thyromegaly. Lungs:  Clear throughout to auscultation.   No wheezes, crackles, or rhonchi. No acute distress. Heart:  Regular rate and rhythm; no murmurs, clicks, rubs,  or gallops. Abdomen:  Soft, large tender across the upper abdomen. No masses, hepatosplenomegaly or hernias noted. Bowel sounds quiet, no guarding, or rebound.   .   Msk:  Symmetrical without gross deformities. Normal posture. Pulses:  Normal pulses noted. Extremities:  Without clubbing or edema. Neurologic:  Alert and  oriented x4;  grossly normal neurologically.  Psych:  Alert and cooperative. Normal mood and affect.  Intake/Output from previous day:   Intake/Output this shift: Total I/O In: 900 [I.V.:900] Out: -   Lab Results:  Basename 07/21/11 1743  WBC 5.0  HGB 12.0  HCT 35.9*  PLT 215   BMET  Basename 07/21/11 1743  NA 136  K 3.9  CL 102  CO2 28  GLUCOSE 83  BUN 9  CREATININE 0.66  CALCIUM 9.1   LFT  Basename 07/21/11 1743  PROT 7.5  ALBUMIN 3.5  AST 435*  ALT 388*  ALKPHOS 146*  BILITOT 0.6  BILIDIR --  IBILI --   PT/INR  Hepatitis Panel     Studies/Results: Dg Cholangiogram Operative  07/22/2011  *RADIOLOGY REPORT*  Clinical Data:   30 year old female with cholelithiasis. Intraoperative cholangiogram during cholecystectomy.  INTRAOPERATIVE CHOLANGIOGRAM  Technique:  Cholangiographic images from the C-arm fluoroscopic device were submitted for interpretation post-operatively.  Please see the procedural report for the amount of contrast and the fluoroscopy time utilized.  Comparison:  07/21/2011 ultrasound  Findings:  Fullness of the CBD  and intrahepatic biliary system identified. No definite filling defects identified within the biliary system. The distal CBD is not well visualized and there is no emptying from the distal CBD into the duodenum, even after administration of clip.  IMPRESSION: Distal CBD not well visualized and no emptying into the duodenum. Although no definite filling defects are identified, an obstructing lesion or calculus is not excluded and consider further evaluation.  Mild fullness of the CBD and intrahepatic biliary system.  Original Report Authenticated By: Rosendo Gros, M.D.   US Abdomen Complete  07/21/2011  *RADIOLOGY REPORT*  Clinical Data:  Elevated LFTs; known gallstones.  Abdominal pain, nausea and vomiting.  Assess for cholecystitis or obstruction.  ABDOMINAL  ULTRASOUND COMPLETE  Comparison:  Abdominal ultrasound performed 04/24/2011  Findings:  Gallbladder:  A large 2.0 cm stone is again noted layering dependently at the base of the gallbladder; this does not move appreciably on decubitus imaging, and may be lodged at the base of the gallbladder.  No gallbladder wall thickening is appreciated; no pericholecystic fluid is seen to suggest cholecystitis.  No ultrasonographic Murphy's sign is elicited.  Common Bile Duct:  0.6 cm in diameter; within normal limits in caliber.  Liver:  Diffusely increased parenchymal echogenicity and coarsened echotexture, compatible with fatty infiltration; no focal lesions identified.  Limited Doppler evaluation demonstrates normal blood flow within the liver.  IVC:  Unremarkable in appearance.  Pancreas:  Although the pancreas is difficult to visualize in its entirety due to overlying bowel gas, no focal pancreatic abnormality is identified.  Spleen:  6.7 cm in length; within normal limits in size and echotexture.  Right kidney:  12.2 cm in length; normal in size, configuration and parenchymal echogenicity.  No evidence of mass or hydronephrosis.  Left kidney:  12.4 cm in length;  normal in size, configuration and parenchymal echogenicity.  No evidence of mass or hydronephrosis.  Abdominal Aorta:  Normal in caliber; no aneurysm identified.  IMPRESSION:  1.  Previously noted 2.0 cm stone appears to be lodged at the base of the gallbladder; no evidence for obstruction or cholecystitis. No ultrasonographic Murphy's sign seen. 2.  Fatty infiltration noted within the liver.  Original Report Authenticated By: Tonia Ghent, M.D.    Impression:  #48 30 year old female, with morbid obesity and hypertension status post laparoscopic cholecystectomy today for cholelithiasis symptomatic. #2 elevated liver function studies #3 abnormal intraoperative cholangiogram with no emptying of dye into the duodenum on the raising concern for choledocholithiasis.  Plan: Patient is still sleepy postop, I did discuss need for ERCP with her however this will need to be gone over again when she is more awake Patient will be scheduled for ERCP and possible stone extraction with Drs. Hung/Mann tomorrow. N.p.o. after midnight, will continue antibiotics and hold any Lovenox order. Repeat LFT's in am    Airis Barbee  07/22/2011, 12:07 PM

## 2011-07-22 NOTE — ED Notes (Signed)
Bethany RN given report

## 2011-07-22 NOTE — ED Notes (Signed)
Patient denies pain and is resting comfortably.  

## 2011-07-22 NOTE — H&P (Signed)
[facility]  Ovidio Kin, MD,  FACS [address]  [address]  [place], [place]    [address] Phone:  [phone] FAX:  [phone]  Re:   Maria Carpenter DOB:   1981-08-09 MRN:   [id number]  Admission H&P  ASSESSMENT AND PLAN: 1.  Gall bladder disease.  Known since at least 2009.  Could not follow-up because of finances.  I discussed with the patient the indications and risks of gall bladder surgery.  The primary risks of gall bladder surgery include, but are not limited to, bleeding, infection, common bile duct injury, and open surgery.  There is also the risk that the patient may have continued symptoms after surgery. I tried to answer the patient's questions.  I discussed that her weight increases her risks of surgery and her post operative care.  I gave the patient literature about gall bladder surgery.  The husband, RIcardo, was at the bedside.  Will plan surgery this AM.  2.  Diabetes mellitus.  But off meds, diet controlled. 3.  Hypertension. 4.  History of stroke - March 2012 (seen by Dr. Levie Heritage in Aug 2012).  5.  History of SVT. 6.  Morbid obesity. 7.  History of asthma. 8.  Migraine headaches. 9.  History of DVT, right leg - 2009. 10.  Smokes.  Chief Complaint  Patient presents with  . Abdominal Pain   REFERRING PHYSICIAN: Dr. Dierdre Highman, [facility].  HISTORY OF PRESENT ILLNESS: Maria Carpenter is a 30 y.o. (DOB: 11-Mar-1981)  AA female whose primary care physician is No primary provider on file. and [facility] ER for gall bladder disease.  Patient has had known gallstone since at least 2009.  She has had symptoms on and off, but because of finances, has not sought surgical consultation.  Only prior abdominal surgery was TL in 2009.  Patient has no history of stomach disease.  No history of liver disease.  No history of gall bladder disease.  No history of pancreas disease.  No history of colon disease.    Past Medical History  Diagnosis Date  .  Diabetes mellitus     A2DM during pregnancy. now on metformin.   [name] Hypertension   . Stroke 2012  . SVT (supraventricular tachycardia)     on propanalol  . Gonorrhea 2010    treated  . Chlamydia 2012    treated  . Pregnancy induced hypertension     pre E with pregnancies  . Asthma   . Rheumatoid arthritis       Past Surgical History  Procedure Date  . Tubal ligation 2009  . Finger surgery 1993    left ring finger to correct bone "problem"      Current Facility-Administered Medications  Medication Dose Route Frequency Provider Last Rate Last Dose  . 0.9 %  sodium chloride infusion   Intravenous Continuous Sunnie Nielsen, MD 100 mL/hr at 07/22/11 0015    . 0.9 % NaCl with KCl 20 mEq/ L  infusion   Intravenous Continuous Sunnie Nielsen, MD      . diphenhydrAMINE (BENADRYL) injection 25 mg  25 mg Intravenous Once Sunnie Nielsen, MD   25 mg at 07/22/11 7253  . HYDROmorphone (DILAUDID) injection 1 mg  1 mg Intravenous Once Sunnie Nielsen, MD   1 mg at 07/22/11 0017  . HYDROmorphone (DILAUDID) injection 1 mg  1 mg Intravenous Q4H PRN Sunnie Nielsen, MD      . ondansetron [facility]) injection 4 mg  4 mg Intravenous Once Sunnie Nielsen, MD   4 mg at 07/22/11 0016  . ondansetron (ZOFRAN) injection 4 mg  4 mg Intravenous Q6H PRN Sunnie Nielsen, MD       Current Outpatient Prescriptions  Medication Sig Dispense Refill  . acetaminophen (TYLENOL) 500 MG tablet Take 500-1,000 mg by mouth every 6 (six) hours as needed. For pain/headache.       [name] aspirin 81 MG EC tablet Take 81 mg by mouth daily.        . famotidine (PEPCID) 20 MG tablet Take 20 mg by mouth 2 (two) times daily.        . hydrochlorothiazide (HYDRODIURIL) 25 MG tablet Take 25 mg by mouth daily.        [name] lisinopril (PRINIVIL,ZESTRIL) 10 MG tablet Take 10 mg by mouth daily.        . Multiple Vitamins-Minerals (MULTIVITAMIN WITH MINERALS) tablet Take 1 tablet by mouth daily.        . propranolol (INDERAL) 10 MG tablet Take 1 tablet (10 mg total) by  mouth 2 (two) times daily.  40 tablet  0  . simvastatin (ZOCOR) 10 MG tablet Take 10 mg by mouth at bedtime.         Facility-Administered Medications Ordered in Other Encounters  Medication Dose Route Frequency Provider Last Rate Last Dose  . 0.9 %  sodium chloride infusion   Intravenous Once Rodena Medin, PA 100 mL/hr at 07/21/11 1741    . dextrose 50 % solution 25 mL  25 mL Intravenous Once Arman Filter, NP   25 mL at 07/21/11 2041  . diphenhydrAMINE (BENADRYL) 50 MG/ML injection           . HYDROmorphone (DILAUDID) injection 1 mg  1 mg Intravenous Once Rodena Medin, PA   1 mg at 07/21/11 1743  . HYDROmorphone (DILAUDID) injection 1 mg  1 mg Intravenous Once Arman Filter, NP   1 mg at 07/21/11 2041  . lidocaine (XYLOCAINE) 1 % (with pres) injection           . ondansetron (ZOFRAN) injection 4 mg  4 mg Intravenous Once Rodena Medin, PA   4 mg at 07/21/11 1741      Allergies  Allergen Reactions  . Amoxicillin Anaphylaxis and Rash    Facial swelling    REVIEW OF SYSTEMS: Skin:  No history of rash.  No history of abnormal moles. Infection:  No history of hepatitis or HIV.  No history of MRSA. Neurologic: History of stroke - 2012.  Has migraine headaches. Cardiac:  Hypertension.  Has history of SVT.  No history of heart disease  No history of seeing a cardiologist. Pulmonary:  Smoke cigarettes.  Occasional asthma.  Endocrine: Diabetes, but off metformin, diet controlled. No thyroid disease. Gastrointestinal: See HPI. Urologic:  No history of kidney stones.  No history of bladder infections. GYN:  6 children - 3,6,8,9,10,11. 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 Hematologic: History of DVT in 2009 with her last child.[name] Psycho-social:  The patient is oriented.   The patient has no obvious psychologic or social impairment to understanding our conversation and plan.  SOCIAL and FAMILY HISTORY: Married.  Husband Derek [place]. Works at home.  PHYSICAL  EXAM: BP 148/80  Pulse 76  Temp(Src) 97.9 F (36.6 C) (Oral)  Resp 18  SpO2 100%  LMP 05/09/2011  General: Morbidly obese AA F who is alert and generally healthy appearing.  HEENT: Normal. Pupils equal. Good  dentition. Neck: Supple. No mass.  No thyroid mass.  Carotid pulse okay with no bruit. Lymph Nodes:  No supraclavicular or cervical nodes. Lungs: Clear to auscultation and symmetric breath sounds. Heart:  RRR. No murmur or rub.  Abdomen: Soft. No mass. No tenderness. No hernia. Normal bowel sounds.  No abdominal scars.  Very obese.  PE limited by size of patient. Rectal: Not done. Extremities:  Good strength and ROM  in upper and lower extremities. Neurologic:  Grossly intact to motor and sensory function. Psychiatric: Has normal mood and affect. Behavior is normal.   DATA REVIEWED: 2 cm gallstone on [place] - 07/21/2011 WBC - 5,000, Hgb - 12.0 LFT - AST - 435, ALT - 388, Alk Phos - 146, T Bili - 0.6 Lipase - 19   Ovidio Kin, MD, FACS Phone:  [phone]

## 2011-07-22 NOTE — Anesthesia Procedure Notes (Signed)
Procedure Name: Intubation Date/Time: 07/22/2011 7:42 AM Performed by: Maria Carpenter Patient Re-evaluated:Patient Re-evaluated prior to inductionOxygen Delivery Method: Circle System Utilized Preoxygenation: Pre-oxygenation with 100% oxygen Intubation Type: IV induction, Rapid sequence and Circoid Pressure applied Laryngoscope Size: Mac and 4 Grade View: Grade III Tube type: Oral Tube size: 7.5 mm Number of attempts: 1 Placement Confirmation: ETT inserted through vocal cords under direct vision,  positive ETCO2 and breath sounds checked- equal and bilateral Secured at: 21 cm Tube secured with: Tape Dental Injury: Teeth and Oropharynx as per pre-operative assessment

## 2011-07-23 ENCOUNTER — Other Ambulatory Visit: Payer: Self-pay

## 2011-07-23 ENCOUNTER — Encounter (HOSPITAL_COMMUNITY): Payer: Self-pay | Admitting: Family Medicine

## 2011-07-23 ENCOUNTER — Inpatient Hospital Stay (HOSPITAL_COMMUNITY): Payer: Medicaid Other

## 2011-07-23 LAB — COMPREHENSIVE METABOLIC PANEL
ALT: 243 U/L — ABNORMAL HIGH (ref 0–35)
AST: 123 U/L — ABNORMAL HIGH (ref 0–37)
Albumin: 3.3 g/dL — ABNORMAL LOW (ref 3.5–5.2)
Alkaline Phosphatase: 172 U/L — ABNORMAL HIGH (ref 39–117)
BUN: 5 mg/dL — ABNORMAL LOW (ref 6–23)
CO2: 27 mEq/L (ref 19–32)
Calcium: 9.4 mg/dL (ref 8.4–10.5)
Chloride: 99 mEq/L (ref 96–112)
Creatinine, Ser: 0.6 mg/dL (ref 0.50–1.10)
GFR calc Af Amer: >90 mL/min (ref >90)
GFR calc non Af Amer: >90 mL/min (ref >90)
Glucose, Bld: 115 mg/dL — ABNORMAL HIGH (ref 70–99)
Potassium: 3.9 mEq/L (ref 3.5–5.1)
Sodium: 135 mEq/L (ref 135–145)
Total Bilirubin: 3.2 mg/dL — ABNORMAL HIGH (ref 0.3–1.2)
Total Protein: 7.3 g/dL (ref 6.0–8.3)

## 2011-07-23 LAB — GLUCOSE, CAPILLARY
Glucose-Capillary: 130 mg/dL — ABNORMAL HIGH (ref 70–99)
Glucose-Capillary: 108 mg/dL — ABNORMAL HIGH (ref 70–99)
Glucose-Capillary: 96 mg/dL (ref 70–99)
Glucose-Capillary: 105 mg/dL — ABNORMAL HIGH (ref 70–99)

## 2011-07-23 LAB — HEMOGLOBIN A1C
Hgb A1c MFr Bld: 5.9 % — ABNORMAL HIGH (ref <5.7)
Mean Plasma Glucose: 123 mg/dL — ABNORMAL HIGH (ref <117)

## 2011-07-23 LAB — CBC
HCT: 36.9 % (ref 36.0–46.0)
Hemoglobin: 12 g/dL (ref 12.0–15.0)
MCH: 27.7 pg (ref 26.0–34.0)
MCHC: 32.5 g/dL (ref 30.0–36.0)
MCV: 85.2 fL (ref 78.0–100.0)
Platelets: 247 10*3/uL (ref 150–400)
RBC: 4.33 MIL/uL (ref 3.87–5.11)
RDW: 13.6 % (ref 11.5–15.5)
WBC: 9.2 10*3/uL (ref 4.0–10.5)

## 2011-07-23 LAB — LIPASE, BLOOD: Lipase: 19 U/L (ref 11–59)

## 2011-07-23 LAB — GC/CHLAMYDIA PROBE AMP, GENITAL
Chlamydia, DNA Probe: NEGATIVE
GC Probe Amp, Genital: NEGATIVE

## 2011-07-23 MED ORDER — PNEUMOCOCCAL VAC POLYVALENT 25 MCG/0.5ML IJ INJ
0.5000 mL | INJECTION | INTRAMUSCULAR | Status: AC
  Administered 2011-07-24: 0.5 mL via INTRAMUSCULAR
  Filled 2011-07-23: qty 0.5

## 2011-07-23 MED ORDER — HYDROCHLOROTHIAZIDE 25 MG PO TABS
25.0000 mg | ORAL_TABLET | Freq: Every day | ORAL | Status: DC
  Administered 2011-07-23 – 2011-07-25 (×3): 25 mg via ORAL
  Filled 2011-07-23 (×4): qty 1

## 2011-07-23 MED ORDER — KETOROLAC TROMETHAMINE 30 MG/ML IJ SOLN
30.0000 mg | Freq: Four times a day (QID) | INTRAMUSCULAR | Status: DC | PRN
  Administered 2011-07-23 – 2011-07-25 (×4): 30 mg via INTRAVENOUS
  Filled 2011-07-23 (×5): qty 1

## 2011-07-23 MED ORDER — INFLUENZA VIRUS VACC SPLIT PF IM SUSP
0.5000 mL | INTRAMUSCULAR | Status: AC
  Administered 2011-07-24: 0.5 mL via INTRAMUSCULAR
  Filled 2011-07-23: qty 0.5

## 2011-07-23 MED ORDER — PROMETHAZINE HCL 25 MG/ML IJ SOLN
12.5000 mg | Freq: Four times a day (QID) | INTRAMUSCULAR | Status: DC | PRN
  Administered 2011-07-23 (×2): 12.5 mg via INTRAVENOUS
  Filled 2011-07-23 (×2): qty 1

## 2011-07-23 NOTE — Progress Notes (Signed)
Above cosign in error.  Pt examined.  Apparent CBD stone.  ERCP planned by GI tomorrow under general anesthesia  Mariella Saa MD, FACS  07/23/2011, 5:10 PM

## 2011-07-23 NOTE — Progress Notes (Signed)
Ur review completed.  CM received referral.  See [facility] note in shadow chart.

## 2011-07-23 NOTE — Progress Notes (Signed)
Seen and agree, likely SBO.  Transfer to Dr Byrd Hesselbach anticipated  Mariella Saa MD, FACS  07/23/2011, 5:06 PM

## 2011-07-23 NOTE — Progress Notes (Signed)
Patient ID: Maria Carpenter, female   DOB: Jul 09, 1981, 30 y.o.   MRN: [id number] Subjective: Pain is well controlled at this time.  It does make her sleepy.  Objective: Vital signs in last 24 hours: Temp:  [97.3 F (36.3 C)-99 F (37.2 C)] 98.7 F (37.1 C) (11/26 0900) Pulse Rate:  [60-76] 65  (11/26 1130) Resp:  [15-20] 18  (11/26 1222) BP: (127-180)/(79-124) 177/107 mmHg (11/26 1117) SpO2:  [91 %-100 %] 94 % (11/26 1222)    Intake/Output from previous day: 11/25 0701 - 11/26 0700 In: 2181.7 [I.V.:2181.7] Out: 4000 [Urine:4000] Intake/Output this shift:    General appearance: no distress GI: tender at the incision sites, morbidly obese  Lab Results:  [facility] 07/23/11 0425 07/21/11 1743  WBC 9.2 5.0  HGB 12.0 12.0  HCT 36.9 35.9*  PLT 247 215   BMET  Basename 07/23/11 0425 07/21/11 1743  NA 135 136  K 3.9 3.9  CL 99 102  CO2 27 28  GLUCOSE 115* 83  BUN 5* 9  CREATININE 0.60 0.66  CALCIUM 9.4 9.1   LFT  Basename 07/23/11 0425  PROT 7.3  ALBUMIN 3.3*  AST 123*  ALT 243*  ALKPHOS 172*  BILITOT 3.2*  BILIDIR --  IBILI --   PT/INR No results found for this basename: LABPROT:2,INR:2 in the last 72 hours Hepatitis Panel No results found for this basename: HEPBSAG,HCVAB,HEPAIGM,HEPBIGM in the last 72 hours C-Diff No results found for this basename: CDIFFTOX:3 in the last 72 hours Fecal Lactopherrin No results found for this basename: FECLLACTOFRN in the last 72 hours  Studies/Results: Dg Cholangiogram Operative  07/22/2011  *RADIOLOGY REPORT*  Clinical Data:   30 year old female with cholelithiasis. Intraoperative cholangiogram during cholecystectomy.  INTRAOPERATIVE CHOLANGIOGRAM  Technique:  Cholangiographic images from the C-arm fluoroscopic device were submitted for interpretation post-operatively.  Please see the procedural report for the amount of contrast and the fluoroscopy time utilized.  Comparison:  07/21/2011 ultrasound  Findings:  Fullness  of the CBD and intrahepatic biliary system identified. No definite filling defects identified within the biliary system. The distal CBD is not well visualized and there is no emptying from the distal CBD into the duodenum, even after administration of clip.  IMPRESSION: Distal CBD not well visualized and no emptying into the duodenum. Although no definite filling defects are identified, an obstructing lesion or calculus is not excluded and consider further evaluation.  Mild fullness of the CBD and intrahepatic biliary system.  Original Report Authenticated By: Rosendo Gros, M.D.   US Abdomen Complete  07/21/2011  *RADIOLOGY REPORT*  Clinical Data:  Elevated LFTs; known gallstones.  Abdominal pain, nausea and vomiting.  Assess for cholecystitis or obstruction.  ABDOMINAL ULTRASOUND COMPLETE  Comparison:  Abdominal ultrasound performed 04/24/2011  Findings:  Gallbladder:  A large 2.0 cm stone is again noted layering dependently at the base of the gallbladder; this does not move appreciably on decubitus imaging, and may be lodged at the base of the gallbladder.  No gallbladder wall thickening is appreciated; no pericholecystic fluid is seen to suggest cholecystitis.  No ultrasonographic Murphy's sign is elicited.  Common Bile Duct:  0.6 cm in diameter; within normal limits in caliber.  Liver:  Diffusely increased parenchymal echogenicity and coarsened echotexture, compatible with fatty infiltration; no focal lesions identified.  Limited Doppler evaluation demonstrates normal blood flow within the liver.  IVC:  Unremarkable in appearance.  Pancreas:  Although the pancreas is difficult to visualize in its entirety due to overlying bowel gas,  no focal pancreatic abnormality is identified.  Spleen:  6.7 cm in length; within normal limits in size and echotexture.  Right kidney:  12.2 cm in length; normal in size, configuration and parenchymal echogenicity.  No evidence of mass or hydronephrosis.  Left kidney:  12.4 cm  in length; normal in size, configuration and parenchymal echogenicity.  No evidence of mass or hydronephrosis.  Abdominal Aorta:  Normal in caliber; no aneurysm identified.  IMPRESSION:  1.  Previously noted 2.0 cm stone appears to be lodged at the base of the gallbladder; no evidence for obstruction or cholecystitis. No ultrasonographic Murphy's sign seen. 2.  Fatty infiltration noted within the liver.  Original Report Authenticated By: Tonia Ghent, M.D.    Medications:  Scheduled:   . ciprofloxacin  400 mg Intravenous Q12H  . fentaNYL   Intravenous Q4H  . Flora-Q  1 capsule Oral Daily  . hydrochlorothiazide  25 mg Oral Daily  . influenza  inactive virus vaccine  0.5 mL Intramuscular Tomorrow-1000  . insulin aspart      . insulin aspart      . lip balm  1 application Topical BID  . lisinopril  10 mg Oral Daily  . pneumococcal 23 valent vaccine  0.5 mL Intramuscular Tomorrow-1000  . promethazine      . propranolol  10 mg Oral BID  . DISCONTD: HYDROmorphone      . DISCONTD: influenza  inactive virus vaccine  0.5 mL Intramuscular Tomorrow-1000  . DISCONTD: naproxen  500 mg Oral BID WC  . DISCONTD: pneumococcal 23 valent vaccine  0.5 mL Intramuscular Tomorrow-1000   Continuous:   . lactated ringers    . DISCONTD: 0.45 % NaCl with KCl 20 mEq / L    . DISCONTD: sodium chloride 100 mL/hr at 07/22/11 0015    Assessment/Plan: 1) Choledocholithiasis   Patient is stable at this time.  Plan: 1) ERCP tomorrow with anesthesia at 2 PM.  I was unable to schedule it for today with anesthesia. 2) Continue with Cipro.  LOS: 2 days   Abi Shoults D 07/23/2011, 12:56 PM

## 2011-07-23 NOTE — Progress Notes (Signed)
Late entry: During morning, after 0930, Pt. C/o increasing RUQ abd pain and swelling in RUQ area of abd. Pt. C/o pressure radiating from epiqastric to chest at approx. 11:00. Rapid response was called as well Tresa Endo PA from [facility]. EKG was obtained. Pt. C/o pain and nausea. B/P 177/107. Toradol 30mg  IV and Phenergan 12.5mg  IV were given. Fentanyl drip remained unchanged. Lopressor was held d/t pulse in mid 50's and giving Phenergan IV while Fentanyl drip was infusing. Pt. Had good relief w/ Toradol IV, pain was 3/10. Phenergan was effective for c/o nausea.

## 2011-07-23 NOTE — Progress Notes (Signed)
INITIAL ADULT NUTRITION ASSESSMENT Date: 07/23/2011   Time: 10:42 AM Reason for Assessment: MD consult  ASSESSMENT: Female 30 y.o.  Dx: Choledocholithiasis with acute cholecystitis  Hx:  Past Medical History  Diagnosis Date  . Diabetes mellitus     A2DM during pregnancy. now on metformin.   [name] Hypertension   . Stroke 2012  . SVT (supraventricular tachycardia)     on propanalol  . Gonorrhea 2010    treated  . Chlamydia 2012    treated  . Pregnancy induced hypertension     pre E with pregnancies  . Asthma   . Rheumatoid arthritis    Related Meds:  Scheduled Meds:   . ciprofloxacin  400 mg Intravenous Q12H  . fentaNYL   Intravenous Q4H  . Flora-Q  1 capsule Oral Daily  . hydrochlorothiazide  25 mg Oral Daily  . influenza  inactive virus vaccine  0.5 mL Intramuscular Tomorrow-1000  . insulin aspart      . insulin aspart      . lip balm  1 application Topical BID  . lisinopril  10 mg Oral Daily  . pneumococcal 23 valent vaccine  0.5 mL Intramuscular Tomorrow-1000  . promethazine      . propranolol  10 mg Oral BID  . DISCONTD: HYDROmorphone      . DISCONTD: insulin aspart  0-15 Units Subcutaneous Q4H  . DISCONTD: naproxen  500 mg Oral BID WC   Continuous Infusions:   . lactated ringers    . DISCONTD: 0.45 % NaCl with KCl 20 mEq / L    . DISCONTD: sodium chloride 100 mL/hr at 07/22/11 0015  . DISCONTD: 0.9 % NaCl with KCl 20 mEq / L     PRN Meds:.acetaminophen, acetaminophen, alum & mag hydroxide-simeth, aspirin-acetaminophen-caffeine, bisacodyl, diphenhydrAMINE, diphenhydrAMINE, fentaNYL, guaiFENesin-dextromethorphan, hydrOXYzine, magic mouthwash, metoCLOPramide (REGLAN) injection, metoprolol, naloxone, ondansetron (ZOFRAN) IV, ondansetron, ondansetron, oxyCODONE, promethazine, sodium chloride, zolpidem, DISCONTD: acetaminophen, DISCONTD: HYDROmorphone, DISCONTD: HYDROmorphone DISCONTD: HYDROmorphone, DISCONTD: morphine, DISCONTD: ondansetron  Ht: 5\' 7"  (170.2  cm)  Wt: 350 lb 8.5 oz (159 kg)  Ideal Wt: 61.4kg % Ideal Wt: 258  Usual Wt: 186.2kg % Usual Wt: 85  Body mass index is 54.90 kg/(m^2).  Food/Nutrition Related Hx: Pt reports nausea/vomiting for the past few months. Pt reports her blood sugars have not been controlled r/t pt not eating meals/snacks consistently. Pt reports 60 pound weight loss during the past 5 months r/t poor intake and appetite, and getting full easy. Pt reports vomiting twice this morning, first episode emesis was green, second episode emesis was yellow/orange. Pt states Zofran not helping with nausea and that pain medication not working, discussed with Char[employer] fundraiser. POD # 1 laparoscopic cholecystectomy. Plans to get ERCP tomorrow per RN.   [place] of abdomen on 11/24 showed 2.0 cm stone appears to be lodged at the base of the gallbladder; no evidence for obstruction or cholecystitis, and fatty infiltration noted within the liver.   DG of cholangiogram on 11/25 showed distal CBD not well visualized and no emptying into the duodenum. Although no definite filling defects are identified, an obstructing lesion or calculus is not excluded and consider further evaluation. Mild fullness of the CBD and intrahepatic biliary system.   Labs:  CMP     Component Value Date/Time   NA 135 07/23/2011 0425   K 3.9 07/23/2011 0425   CL 99 07/23/2011 0425   CO2 27 07/23/2011 0425   GLUCOSE 115* 07/23/2011 0425   BUN 5* 07/23/2011 0425  CREATININE 0.60 07/23/2011 0425   CREATININE 0.39* 09/27/2007 1822   CALCIUM 9.4 07/23/2011 0425   PROT 7.3 07/23/2011 0425   ALBUMIN 3.3* 07/23/2011 0425   AST 123* 07/23/2011 0425   ALT 243* 07/23/2011 0425   ALKPHOS 172* 07/23/2011 0425   BILITOT 3.2* 07/23/2011 0425   GFRNONAA >90 07/23/2011 0425   GFRAA >90 07/23/2011 0425   CBG (last 3)   Basename 07/23/11 0755 07/22/11 2135 07/22/11 1703  GLUCAP 130* 118* 101*    Intake/Output Summary (Last 24 hours) at 07/23/11 1049 Last data filed at 07/23/11  0529  Gross per 24 hour  Intake 1281.7 ml  Output   4000 ml  Net -2718.3 ml    Diet Order: NPO   IVF:    lactated ringers   DISCONTD: 0.45 % NaCl with KCl 20 mEq / L   DISCONTD: sodium chloride Last Rate: 100 mL/hr at 07/22/11 0015  DISCONTD: 0.9 % NaCl with KCl 20 mEq / L     Estimated Nutritional Needs:   Kcal:1550-1850 Protein:75-90g Fluid:1.5-1.8L  NUTRITION DIAGNOSIS: -Inadequate oral intake (NI-2.1).  Status: Ongoing -Pt meets criteria for severe PCM of chronic illness AEB 14% weight loss in the past 5 months and <75% intake for the past few months per pt report.  RELATED TO: nausea/vomiting  AS EVIDENCE BY: pt statement, NPO, significant reported weight loss  MONITORING/EVALUATION(Goals): Advance diet as tolerated to heart healthy, diabetic diet.   EDUCATION NEEDS: -No education needs identified at this time. Pt previously educated on heart healthy diet after stroke. Pt without any nutrition educational needs.   INTERVENTION: Diet advancement per MD. Hopefully once nausea/vomiting controlled, diet can be advanced. Will monitor.   Dietitian # [phone]  DOCUMENTATION CODES Per approved criteria  -Severe malnutrition in the context of chronic illness -Morbid obesity    Marshall Cork 07/23/2011, 10:42 AM

## 2011-07-23 NOTE — Significant Event (Signed)
Rapid Response Event Note  Overview:      Initial Focused Assessment: abd soft tender to palpation.  resp regular o2 sats 97% . Sinus rhythm rate 60     Interventions: 12 lead EKG  Event Summary:   at      at   Recent lap chole c/o of pressure in upper abd. .  Patient sitting up in bed leaning forward states pain started last night but has been getting worse      Maria Carpenter in will check with GI doctors to see about possible ERCP today for possible retained stone Maria Carpenter

## 2011-07-23 NOTE — Progress Notes (Signed)
Patient ID: Maria Carpenter, female   DOB: February 26, 1981, 30 y.o.   MRN: [id number] 1 Day Post-Op  Subjective: Pt with nausea and some bilious emesis.  Says her ERCP got cancelled for today?  Objective: Vital signs in last 24 hours: Temp:  [97.3 F (36.3 C)-99 F (37.2 C)] 98.9 F (37.2 C) (11/26 0450) Pulse Rate:  [59-76] 66  (11/26 0450) Resp:  [15-20] 16  (11/26 0450) BP: (127-180)/(79-124) 160/98 mmHg (11/26 0450) SpO2:  [2 %-100 %] 91 % (11/26 0450)    Intake/Output from previous day: 11/25 0701 - 11/26 0700 In: 2181.7 [I.V.:2181.7] Out: 4000 [Urine:4000] Intake/Output this shift:    PE: Abd: soft, tender, -BS, obese, incisions are C/D/I with dermabond. HT: regular Lungs: CTAB  Lab Results:   Basename 07/23/11 0425 07/21/11 1743  WBC 9.2 5.0  HGB 12.0 12.0  HCT 36.9 35.9*  PLT 247 215   BMET  Basename 07/23/11 0425 07/21/11 1743  NA 135 136  K 3.9 3.9  CL 99 102  CO2 27 28  GLUCOSE 115* 83  BUN 5* 9  CREATININE 0.60 0.66  CALCIUM 9.4 9.1   TB: 3.2 PT/INR No results found for this basename: LABPROT:2,INR:2 in the last 72 hours   Studies/Results: Dg Cholangiogram Operative  07/22/2011  *RADIOLOGY REPORT*  Clinical Data:   30 year old female with cholelithiasis. Intraoperative cholangiogram during cholecystectomy.  INTRAOPERATIVE CHOLANGIOGRAM  Technique:  Cholangiographic images from the C-arm fluoroscopic device were submitted for interpretation post-operatively.  Please see the procedural report for the amount of contrast and the fluoroscopy time utilized.  Comparison:  07/21/2011 ultrasound  Findings:  Fullness of the CBD and intrahepatic biliary system identified. No definite filling defects identified within the biliary system. The distal CBD is not well visualized and there is no emptying from the distal CBD into the duodenum, even after administration of clip.  IMPRESSION: Distal CBD not well visualized and no emptying into the duodenum. Although no  definite filling defects are identified, an obstructing lesion or calculus is not excluded and consider further evaluation.  Mild fullness of the CBD and intrahepatic biliary system.  Original Report Authenticated By: Rosendo Gros, M.D.   US Abdomen Complete  07/21/2011  *RADIOLOGY REPORT*  Clinical Data:  Elevated LFTs; known gallstones.  Abdominal pain, nausea and vomiting.  Assess for cholecystitis or obstruction.  ABDOMINAL ULTRASOUND COMPLETE  Comparison:  Abdominal ultrasound performed 04/24/2011  Findings:  Gallbladder:  A large 2.0 cm stone is again noted layering dependently at the base of the gallbladder; this does not move appreciably on decubitus imaging, and may be lodged at the base of the gallbladder.  No gallbladder wall thickening is appreciated; no pericholecystic fluid is seen to suggest cholecystitis.  No ultrasonographic Murphy's sign is elicited.  Common Bile Duct:  0.6 cm in diameter; within normal limits in caliber.  Liver:  Diffusely increased parenchymal echogenicity and coarsened echotexture, compatible with fatty infiltration; no focal lesions identified.  Limited Doppler evaluation demonstrates normal blood flow within the liver.  IVC:  Unremarkable in appearance.  Pancreas:  Although the pancreas is difficult to visualize in its entirety due to overlying bowel gas, no focal pancreatic abnormality is identified.  Spleen:  6.7 cm in length; within normal limits in size and echotexture.  Right kidney:  12.2 cm in length; normal in size, configuration and parenchymal echogenicity.  No evidence of mass or hydronephrosis.  Left kidney:  12.4 cm in length; normal in size, configuration and parenchymal echogenicity.  No  evidence of mass or hydronephrosis.  Abdominal Aorta:  Normal in caliber; no aneurysm identified.  IMPRESSION:  1.  Previously noted 2.0 cm stone appears to be lodged at the base of the gallbladder; no evidence for obstruction or cholecystitis. No ultrasonographic Murphy's  sign seen. 2.  Fatty infiltration noted within the liver.  Original Report Authenticated By: Tonia Ghent, M.D.    Anti-infectives: Anti-infectives     Start     Dose/Rate Route Frequency Ordered Stop   07/22/11 1800   ciprofloxacin (CIPRO) IVPB 400 mg        400 mg 200 mL/hr over 60 Minutes Intravenous Every 12 hours 07/22/11 1044             Assessment/Plan  1. S/p lap chole 2. Likely retained CBD stone 3. Post-op ileus 4. HTN  Plan: Cont NPO secondary to nausea and emesis 2. Add HCTZ for her HTN 3. ERCP cancelled for today.  Will plan on ERCP tomorrow per RN. 4. Needs to ambulate in halls. 5. Recheck labs in AM   LOS: 2 days    Aizik Reh E 07/23/2011

## 2011-07-24 ENCOUNTER — Encounter (HOSPITAL_COMMUNITY): Admission: EM | Disposition: A | Discharge status: Home / Self Care | Payer: Self-pay | Source: Home / Self Care

## 2011-07-24 ENCOUNTER — Inpatient Hospital Stay (HOSPITAL_COMMUNITY): Payer: Medicaid Other

## 2011-07-24 ENCOUNTER — Encounter (HOSPITAL_COMMUNITY): Payer: Self-pay | Admitting: Anesthesiology

## 2011-07-24 ENCOUNTER — Inpatient Hospital Stay (HOSPITAL_COMMUNITY): Payer: Medicaid Other | Admitting: Anesthesiology

## 2011-07-24 ENCOUNTER — Encounter (HOSPITAL_COMMUNITY): Payer: Self-pay | Admitting: *Deleted

## 2011-07-24 ENCOUNTER — Encounter (HOSPITAL_COMMUNITY): Payer: Self-pay | Admitting: Gastroenterology

## 2011-07-24 HISTORY — PX: ERCP: SHX5425

## 2011-07-24 LAB — COMPREHENSIVE METABOLIC PANEL
ALT: 216 U/L — ABNORMAL HIGH (ref 0–35)
AST: 109 U/L — ABNORMAL HIGH (ref 0–37)
Albumin: 3.3 g/dL — ABNORMAL LOW (ref 3.5–5.2)
Alkaline Phosphatase: 185 U/L — ABNORMAL HIGH (ref 39–117)
BUN: 7 mg/dL (ref 6–23)
CO2: 28 mEq/L (ref 19–32)
Calcium: 9.5 mg/dL (ref 8.4–10.5)
Chloride: 99 mEq/L (ref 96–112)
Creatinine, Ser: 0.69 mg/dL (ref 0.50–1.10)
GFR calc Af Amer: >90 mL/min (ref >90)
GFR calc non Af Amer: >90 mL/min (ref >90)
Glucose, Bld: 97 mg/dL (ref 70–99)
Potassium: 3.6 mEq/L (ref 3.5–5.1)
Sodium: 133 mEq/L — ABNORMAL LOW (ref 135–145)
Total Bilirubin: 4.1 mg/dL — ABNORMAL HIGH (ref 0.3–1.2)
Total Protein: 7.4 g/dL (ref 6.0–8.3)

## 2011-07-24 LAB — GLUCOSE, CAPILLARY
Glucose-Capillary: 89 mg/dL (ref 70–99)
Glucose-Capillary: 106 mg/dL — ABNORMAL HIGH (ref 70–99)

## 2011-07-24 SURGERY — ERCP, WITH INTERVENTION IF INDICATED
Anesthesia: Monitor Anesthesia Care

## 2011-07-24 SURGERY — ERCP, WITH INTERVENTION IF INDICATED
Anesthesia: General

## 2011-07-24 MED ORDER — PROPOFOL 10 MG/ML IV EMUL
INTRAVENOUS | Status: DC | PRN
  Administered 2011-07-24: 200 mg via INTRAVENOUS

## 2011-07-24 MED ORDER — MIDAZOLAM HCL 5 MG/5ML IJ SOLN
INTRAMUSCULAR | Status: DC | PRN
  Administered 2011-07-24: 2 mg via INTRAVENOUS

## 2011-07-24 MED ORDER — SUCCINYLCHOLINE CHLORIDE 20 MG/ML IJ SOLN
INTRAMUSCULAR | Status: DC | PRN
  Administered 2011-07-24: 200 mg via INTRAVENOUS

## 2011-07-24 MED ORDER — EPHEDRINE SULFATE 50 MG/ML IJ SOLN
INTRAMUSCULAR | Status: DC | PRN
  Administered 2011-07-24: 10 mg via INTRAVENOUS

## 2011-07-24 MED ORDER — PHENOL 1.4 % MT LIQD
1.0000 | OROMUCOSAL | Status: DC | PRN
  Administered 2011-07-24 – 2011-07-25 (×2): 1 via OROMUCOSAL
  Filled 2011-07-24: qty 177

## 2011-07-24 MED ORDER — ONDANSETRON HCL 4 MG/2ML IJ SOLN
INTRAMUSCULAR | Status: DC | PRN
  Administered 2011-07-24: 4 mg via INTRAVENOUS

## 2011-07-24 MED ORDER — SODIUM CHLORIDE 0.9 % IJ SOLN
3.0000 mL | Freq: Two times a day (BID) | INTRAMUSCULAR | Status: DC
  Administered 2011-07-24 – 2011-07-25 (×2): 3 mL via INTRAVENOUS

## 2011-07-24 MED ORDER — SODIUM CHLORIDE 0.9 % IJ SOLN
Freq: Once | INTRAMUSCULAR | Status: DC
  Filled 2011-07-24: qty 10

## 2011-07-24 MED ORDER — FENTANYL CITRATE 0.05 MG/ML IJ SOLN
INTRAMUSCULAR | Status: DC | PRN
  Administered 2011-07-24: 50 ug via INTRAVENOUS

## 2011-07-24 MED ORDER — SODIUM CHLORIDE 0.9 % IV SOLN
INTRAVENOUS | Status: DC
  Administered 2011-07-24: 20 mL/h via INTRAVENOUS

## 2011-07-24 MED ORDER — LIDOCAINE HCL (CARDIAC) 20 MG/ML IV SOLN
INTRAVENOUS | Status: DC | PRN
  Administered 2011-07-24: 70 mg via INTRAVENOUS

## 2011-07-24 MED ORDER — PANTOPRAZOLE SODIUM 40 MG PO TBEC
40.0000 mg | DELAYED_RELEASE_TABLET | Freq: Every day | ORAL | Status: DC
  Administered 2011-07-25: 40 mg via ORAL
  Filled 2011-07-24 (×2): qty 1

## 2011-07-24 NOTE — Progress Notes (Signed)
Patient currently undergoing ERCP. Agree with above note.  Mariella Saa MD, FACS  07/24/2011, 6:00 PM

## 2011-07-24 NOTE — Interval H&P Note (Signed)
History and Physical Interval Note:   07/24/2011   4:55 PM   Maria Carpenter  has presented today for surgery, with the diagnosis of cbd stone  The various methods of treatment have been discussed with the patient and family. After consideration of risks, benefits and other options for treatment, the patient has consented to  Procedure(s): ENDOSCOPIC RETROGRADE CHOLANGIOPANCREATOGRAPHY (ERCP) as a surgical intervention .  The patients' history has been reviewed, patient examined, no change in status, stable for surgery.  I have reviewed the patients' chart and labs.  Questions were answered to the patient's satisfaction.     Theda Belfast  MD

## 2011-07-24 NOTE — Progress Notes (Signed)
Patient ID: Maria Carpenter, female   DOB: [birth year], 30 y.o.   MRN: [id number] 2 Days Post-Op  Subjective: Pt feels much better today.  Still with some pain but much better controlled.  Gas pains are moving through as well.  Still with some nausea but less.  Objective: Vital signs in last 24 hours: Temp:  [97.6 F (36.4 C)-98.5 F (36.9 C)] 97.6 F (36.4 C) (11/27 0559) Pulse Rate:  [60-68] 61  (11/27 0941) Resp:  [16-18] 18  (11/27 0941) BP: (152-187)/(79-107) 154/105 mmHg (11/27 0941) SpO2:  [94 %-100 %] 100 % (11/27 0800) Last BM Date: 07/19/11  Intake/Output from previous day:   Intake/Output this shift:    PE: Abd: soft, less tender, decreased BS, obese, incisions c/d/i  Lab Results:   Basename 07/23/11 0425 07/21/11 1743  WBC 9.2 5.0  HGB 12.0 12.0  HCT 36.9 35.9*  PLT 247 215   BMET  Basename 07/24/11 0347 07/23/11 0425  NA 133* 135  K 3.6 3.9  CL 99 99  CO2 28 27  GLUCOSE 97 115*  BUN 7 5*  CREATININE 0.69 0.60  CALCIUM 9.5 9.4   PT/INR No results found for this basename: LABPROT:2,INR:2 in the last 72 hours   Studies/Results: No results found.  Anti-infectives: Anti-infectives     Start     Dose/Rate Route Frequency Ordered Stop   07/22/11 1800   ciprofloxacin (CIPRO) IVPB 400 mg        400 mg 200 mL/hr over 60 Minutes Intravenous Every 12 hours 07/22/11 1044             Assessment/Plan  1.s/p lap chole with choledocholithiasis  2. HTN  Plan: For ERCP today under anaesthesia at 1:00pm Cont all BP meds and prn meds Hopefully can try some clears after ERCP Recheck labs in AM   LOS: 3 days    Madailein Londo E 07/24/2011

## 2011-07-24 NOTE — Anesthesia Postprocedure Evaluation (Signed)
  Anesthesia Post-op Note  Patient: Maria Carpenter  Procedure(s) Performed:  ENDOSCOPIC RETROGRADE CHOLANGIOPANCREATOGRAPHY (ERCP)  Patient Location: PACU  Anesthesia Type: General  Level of Consciousness: awake and alert   Airway and Oxygen Therapy: Patient Spontanous Breathing  Post-op Pain: mild  Post-op Assessment: Post-op Vital signs reviewed, Patient's Cardiovascular Status Stable, Respiratory Function Stable, Patent Airway and No signs of Nausea or vomiting  Post-op Vital Signs: stable  Complications: No apparent anesthesia complications

## 2011-07-24 NOTE — H&P (View-Only) (Signed)
Above cosign in error.  Pt examined.  Apparent CBD stone.  ERCP planned by GI tomorrow under general anesthesia  Raushanah Osmundson T Conn Trombetta MD, FACS  07/23/2011, 5:10 PM   

## 2011-07-24 NOTE — Transfer of Care (Signed)
Immediate Anesthesia Transfer of Care Note  Patient: Maria Carpenter  Procedure(s) Performed:  ENDOSCOPIC RETROGRADE CHOLANGIOPANCREATOGRAPHY (ERCP)  Patient Location: PACU  Anesthesia Type: General  Level of Consciousness: sedated, patient cooperative and responds to stimulaton  Airway & Oxygen Therapy: Patient Spontanous Breathing and Patient connected to face mask oxgen  Post-op Assessment: Report given to PACU RN and Post -op Vital signs reviewed and stable  Post vital signs: Reviewed and stable  Complications: No apparent anesthesia complications

## 2011-07-24 NOTE — Anesthesia Preprocedure Evaluation (Signed)
Anesthesia Evaluation  Patient identified by MRN, date of birth, ID band Patient awake    Reviewed: Allergy & Precautions, H&P , NPO status , Patient's Chart, lab work & pertinent test results  Airway Mallampati: II TM Distance: >3 FB Neck ROM: Full    Dental No notable dental hx.    Pulmonary neg pulmonary ROS, asthma ,  clear to auscultation  Pulmonary exam normal       Cardiovascular hypertension, neg cardio ROS Regular Normal    Neuro/Psych CVA Negative Neurological ROS  Negative Psych ROS   GI/Hepatic negative GI ROS, Neg liver ROS,   Endo/Other  Negative Endocrine ROSDiabetes mellitus-Morbid obesity  Renal/GU negative Renal ROS  Genitourinary negative   Musculoskeletal negative musculoskeletal ROS (+)   Abdominal (+) obese,   Peds negative pediatric ROS (+)  Hematology negative hematology ROS (+)   Anesthesia Other Findings   Reproductive/Obstetrics negative OB ROS                           Anesthesia Physical Anesthesia Plan  ASA: III  Anesthesia Plan: General   Post-op Pain Management:    Induction:   Airway Management Planned: Oral ETT  Additional Equipment:   Intra-op Plan:   Post-operative Plan: Extubation in OR  Informed Consent: I have reviewed the patients History and Physical, chart, labs and discussed the procedure including the risks, benefits and alternatives for the proposed anesthesia with the patient or authorized representative who has indicated his/her understanding and acceptance.   Dental advisory given  Plan Discussed with: CRNA  Anesthesia Plan Comments: (Due to extreme morbid obesity, patient may have to remain intubated post procedure)        Anesthesia Quick Evaluation

## 2011-07-25 ENCOUNTER — Encounter (HOSPITAL_COMMUNITY): Payer: Self-pay | Admitting: Gastroenterology

## 2011-07-25 LAB — COMPREHENSIVE METABOLIC PANEL
ALT: 190 U/L — ABNORMAL HIGH (ref 0–35)
AST: 82 U/L — ABNORMAL HIGH (ref 0–37)
Albumin: 3.1 g/dL — ABNORMAL LOW (ref 3.5–5.2)
Alkaline Phosphatase: 193 U/L — ABNORMAL HIGH (ref 39–117)
BUN: 10 mg/dL (ref 6–23)
CO2: 29 mEq/L (ref 19–32)
Calcium: 9.3 mg/dL (ref 8.4–10.5)
Chloride: 97 mEq/L (ref 96–112)
Creatinine, Ser: 0.74 mg/dL (ref 0.50–1.10)
GFR calc Af Amer: >90 mL/min (ref >90)
GFR calc non Af Amer: >90 mL/min (ref >90)
Glucose, Bld: 100 mg/dL — ABNORMAL HIGH (ref 70–99)
Potassium: 3.3 mEq/L — ABNORMAL LOW (ref 3.5–5.1)
Sodium: 134 mEq/L — ABNORMAL LOW (ref 135–145)
Total Bilirubin: 2.5 mg/dL — ABNORMAL HIGH (ref 0.3–1.2)
Total Protein: 7.2 g/dL (ref 6.0–8.3)

## 2011-07-25 MED ORDER — HYDROCHLOROTHIAZIDE 25 MG PO TABS: 25.0000 mg | ORAL_TABLET | Freq: Every day | ORAL | Status: DC

## 2011-07-25 MED ORDER — LISINOPRIL 10 MG PO TABS: 10.0000 mg | ORAL_TABLET | Freq: Every day | ORAL | Status: DC

## 2011-07-25 MED ORDER — PROPRANOLOL HCL 10 MG PO TABS: 10.0000 mg | ORAL_TABLET | Freq: Two times a day (BID) | ORAL | Status: DC

## 2011-07-25 NOTE — Progress Notes (Signed)
Patient 's prescriptions given and also med s that was filled up by pharmacy, her husband signed her d/c instructions, patient verbalized d/c instructions, stable, pain is controlled with meds

## 2011-07-25 NOTE — Discharge Summary (Signed)
Patient ID: Maria Carpenter MRN: [id number] DOB/AGE: [birth year] 30 y.o.  Admit date: 07/21/2011 Discharge date: 07/25/2011  Procedures:  Lap chole with IOC on 07-22-11 ERCP on 07-24-11 with stone extraction  Consults: GI; Dr. Elnoria Howard  Reason for Admission:  Pt has had known gallstones since 2009.  She presented to the ED secondary to increase in abdominal pain.  Admission Diagnoses: 1. Gallstones with cholecystitis 2. Uncontrolled HTN 3. H/o CVA 4. DM, diet controlled 5. Morbid obesity 6. H/o DVT  Hospital Course:  The patient was admitted and taken to the operating room.  She underwent a lap chole with IOC.  The IOC was positive for a retained CBD stone.  Post-operatively a GI consult was obtained for an ERCP.  Her TB elevated to 3.2 on POD #1 and ultimately to 4.1 on POD #2.  She was taken down for her ERCP which was successful for stone extraction.  The patient's pain significantly improved after her ERCP.  Her TB fell to 2.5 on POD #3.  She was tolerating clears and her diet was advanced to regular.  As long as she tolerates this, if she feels stable, she may be d/c home today.  She will follow up with [place] in clinic in 2-3 weeks.  Discharge Diagnoses:  Principal Problem:  *Choledocholithiasis with acute cholecystitis Active Problems:  Morbid obesity DM HTN H/o CVA H/o DVT Asthma migraines  Discharge Medications: Current Discharge Medication List    CONTINUE these medications which have CHANGED   Details  hydrochlorothiazide (HYDRODIURIL) 25 MG tablet Take 1 tablet (25 mg total) by mouth daily. Qty: 30 tablet, Refills: 0    lisinopril (PRINIVIL,ZESTRIL) 10 MG tablet Take 1 tablet (10 mg total) by mouth daily. Qty: 30 tablet, Refills: 0    propranolol (INDERAL) 10 MG tablet Take 1 tablet (10 mg total) by mouth 2 (two) times daily. Qty: 60 tablet, Refills: 0      CONTINUE these medications which have NOT CHANGED   Details  acetaminophen (TYLENOL) 500 MG tablet Take  500-1,000 mg by mouth every 6 (six) hours as needed. For pain/headache.     aspirin 81 MG EC tablet Take 81 mg by mouth daily.      famotidine (PEPCID) 20 MG tablet Take 20 mg by mouth 2 (two) times daily.      Multiple Vitamins-Minerals (MULTIVITAMIN WITH MINERALS) tablet Take 1 tablet by mouth daily.      simvastatin (ZOCOR) 10 MG tablet Take 10 mg by mouth at bedtime.          Discharge Instructions: Follow-up Information    Follow up with [facility] on 08/07/2011. (2:50pm, arrive at 2:30)          Signed: [name] E 07/25/2011, 12:07 PM

## 2011-07-25 NOTE — Progress Notes (Signed)
Patient ID: Maria Carpenter, female   DOB: 05-Nov-1980, 30 y.o.   MRN: [id number] 1 Day Post-Op  Subjective: Pt feels much better today.  Pain is mostly gone except for surgical pain.  Tolerating clears and wants regular diet.  Objective: Vital signs in last 24 hours: Temp:  [97.4 F (36.3 C)-98.8 F (37.1 C)] 98.5 F (36.9 C) (11/28 0957) Pulse Rate:  [58-74] 67  (11/28 0957) Resp:  [13-21] 18  (11/28 1126) BP: (105-149)/(64-98) 132/92 mmHg (11/28 0957) SpO2:  [97 %-100 %] 100 % (11/28 1126) Last BM Date: 07/24/11  Intake/Output from previous day: 11/27 0701 - 11/28 0700 In: 2921.3 [P.O.:840; I.V.:1721.3; IV Piggyback:10] Out: -  Intake/Output this shift: Total I/O In: 3 [I.V.:3] Out: -   PE: Abd: soft, appropriately tender, +BS, obese, incisions c/d/i  Lab Results:   Basename 07/23/11 0425  WBC 9.2  HGB 12.0  HCT 36.9  PLT 247   BMET  Basename 07/25/11 0430 07/24/11 0347  NA 134* 133*  K 3.3* 3.6  CL 97 99  CO2 29 28  GLUCOSE 100* 97  BUN 10 7  CREATININE 0.74 0.69  CALCIUM 9.3 9.5   Alkaline Phosphatase 193 Albumin 3.1 AST 82 ALT  190  Total Bilirubin  2.5   PT/INR No results found for this basename: LABPROT:2,INR:2 in the last 72 hours   Studies/Results: Dg Ercp With Sphincterotomy  07/25/2011   *RADIOLOGY REPORT*  Clinical Data: Choledocholithiasis  ERCP  Comparison:  Cholangiogram 07/22/2011  Technique:  Multiple spot images obtained with the fluoroscopic device and submitted for interpretation post-procedure.  ERCP was performed by Dr. Elnoria Howard.  Findings: Series of fluoroscopic spot images document endoscopic cannulation of the common bile duct with opacification.  The intrahepatic biliary tree is incompletely visualized, appear decompressed centrally.  Images document passage of a balloon tipped catheter through the common duct.  IMPRESSION:  ERCP and balloon sweep for choledocholithiasis.  These images were submitted for radiologic interpretation only.  Please see the procedural report for the amount of contrast and the fluoroscopy time utilized.   e amount of contrast and the fluoroscopy time utilized.  Original Report Authenticated By: Osa Craver, M.D.    Anti-infectives: Anti-infectives     Start     Dose/Rate Route Frequency Ordered Stop   07/22/11 1800   ciprofloxacin (CIPRO) IVPB 400 mg        400 mg 200 mL/hr over 60 Minutes Intravenous Every 12 hours 07/22/11 1044             Assessment/Plan  1. S/p lap chole 2. S/p ERCP for choledocholithiasis 3. Uncontrolled HTN  Plan: Will give a regular diet.  Pt would like to go home today if she can. If she tolerates a regular diet, she may be d/c and f/u in 2-3 weeks in clinic.   LOS: 4 days    Nupur Hohman E 07/25/2011

## 2011-07-25 NOTE — Progress Notes (Signed)
Patient tolerated the regular diet, no nausea,vomiting.

## 2011-07-25 NOTE — Progress Notes (Signed)
Pt examined, agree.  For discharge today  Mariella Saa MD, FACS  07/25/2011, 7:16 PM

## 2011-07-25 NOTE — Progress Notes (Signed)
Patient d/c home,,stable,denies pain.

## 2011-07-25 NOTE — Progress Notes (Signed)
CM provided med. Assistance to pt.  See [facility] note in shadow chart.

## 2011-07-25 NOTE — Discharge Summary (Signed)
Mariella Saa MD, FACS  07/25/2011, 7:25 PM

## 2011-08-07 ENCOUNTER — Encounter (INDEPENDENT_AMBULATORY_CARE_PROVIDER_SITE_OTHER): Payer: Self-pay

## 2011-09-20 ENCOUNTER — Ambulatory Visit: Payer: Medicaid Other | Attending: Internal Medicine | Admitting: *Deleted

## 2011-09-27 ENCOUNTER — Ambulatory Visit: Payer: Medicaid Other | Admitting: Obstetrics & Gynecology

## 2012-01-23 ENCOUNTER — Emergency Department (HOSPITAL_COMMUNITY)
Admission: EM | Admit: 2012-01-23 | Discharge: 2012-01-24 | Disposition: A | Discharge status: Home / Self Care | Payer: Self-pay | Attending: Emergency Medicine | Admitting: Emergency Medicine

## 2012-01-23 ENCOUNTER — Encounter (HOSPITAL_COMMUNITY): Payer: Self-pay | Admitting: Emergency Medicine

## 2012-01-23 ENCOUNTER — Telehealth: Payer: Self-pay | Admitting: *Deleted

## 2012-01-23 DIAGNOSIS — I10 Essential (primary) hypertension: Secondary | ICD-10-CM | POA: Insufficient documentation

## 2012-01-23 DIAGNOSIS — J45909 Unspecified asthma, uncomplicated: Secondary | ICD-10-CM | POA: Insufficient documentation

## 2012-01-23 DIAGNOSIS — Z8739 Personal history of other diseases of the musculoskeletal system and connective tissue: Secondary | ICD-10-CM | POA: Insufficient documentation

## 2012-01-23 DIAGNOSIS — Z79899 Other long term (current) drug therapy: Secondary | ICD-10-CM | POA: Insufficient documentation

## 2012-01-23 DIAGNOSIS — Z8673 Personal history of transient ischemic attack (TIA), and cerebral infarction without residual deficits: Secondary | ICD-10-CM | POA: Insufficient documentation

## 2012-01-23 DIAGNOSIS — R1031 Right lower quadrant pain: Secondary | ICD-10-CM | POA: Insufficient documentation

## 2012-01-23 DIAGNOSIS — R110 Nausea: Secondary | ICD-10-CM | POA: Insufficient documentation

## 2012-01-23 DIAGNOSIS — E119 Type 2 diabetes mellitus without complications: Secondary | ICD-10-CM | POA: Insufficient documentation

## 2012-01-23 LAB — DIFFERENTIAL
Basophils Absolute: 0 10*3/uL (ref 0.0–0.1)
Basophils Relative: 0 % (ref 0–1)
Eosinophils Absolute: 0.1 10*3/uL (ref 0.0–0.7)
Eosinophils Relative: 1 % (ref 0–5)
Lymphocytes Relative: 38 % (ref 12–46)
Lymphs Abs: 3.2 10*3/uL (ref 0.7–4.0)
Monocytes Absolute: 0.7 10*3/uL (ref 0.1–1.0)
Monocytes Relative: 8 % (ref 3–12)
Neutro Abs: 4.5 10*3/uL (ref 1.7–7.7)
Neutrophils Relative %: 53 % (ref 43–77)

## 2012-01-23 LAB — CBC
HCT: 35.5 % — ABNORMAL LOW (ref 36.0–46.0)
Hemoglobin: 11.7 g/dL — ABNORMAL LOW (ref 12.0–15.0)
MCH: 27.7 pg (ref 26.0–34.0)
MCHC: 33 g/dL (ref 30.0–36.0)
MCV: 83.9 fL (ref 78.0–100.0)
Platelets: 206 10*3/uL (ref 150–400)
RBC: 4.23 MIL/uL (ref 3.87–5.11)
RDW: 14.6 % (ref 11.5–15.5)
WBC: 8.5 10*3/uL (ref 4.0–10.5)

## 2012-01-23 LAB — URINE MICROSCOPIC-ADD ON

## 2012-01-23 LAB — WET PREP, GENITAL
Trich, Wet Prep: NONE SEEN
Yeast Wet Prep HPF POC: NONE SEEN

## 2012-01-23 LAB — COMPREHENSIVE METABOLIC PANEL
ALT: 10 U/L (ref 0–35)
AST: 12 U/L (ref 0–37)
Albumin: 3.6 g/dL (ref 3.5–5.2)
Alkaline Phosphatase: 76 U/L (ref 39–117)
BUN: 9 mg/dL (ref 6–23)
CO2: 25 mEq/L (ref 19–32)
Calcium: 9.4 mg/dL (ref 8.4–10.5)
Chloride: 103 mEq/L (ref 96–112)
Creatinine, Ser: 0.74 mg/dL (ref 0.50–1.10)
GFR calc Af Amer: >90 mL/min (ref >90)
GFR calc non Af Amer: >90 mL/min (ref >90)
Glucose, Bld: 83 mg/dL (ref 70–99)
Potassium: 3.5 mEq/L (ref 3.5–5.1)
Sodium: 139 mEq/L (ref 135–145)
Total Bilirubin: 0.5 mg/dL (ref 0.3–1.2)
Total Protein: 7.3 g/dL (ref 6.0–8.3)

## 2012-01-23 LAB — URINALYSIS, ROUTINE W REFLEX MICROSCOPIC
Glucose, UA: NEGATIVE mg/dL
Hgb urine dipstick: NEGATIVE
Ketones, ur: NEGATIVE mg/dL
Nitrite: NEGATIVE
Protein, ur: NEGATIVE mg/dL
Specific Gravity, Urine: 1.025 (ref 1.005–1.030)
Urobilinogen, UA: 1 mg/dL (ref 0.0–1.0)
pH: 7 (ref 5.0–8.0)

## 2012-01-23 LAB — POCT PREGNANCY, URINE: Preg Test, Ur: NEGATIVE

## 2012-01-23 LAB — LIPASE, BLOOD: Lipase: 14 U/L (ref 11–59)

## 2012-01-23 MED ORDER — ONDANSETRON HCL 4 MG/2ML IJ SOLN
4.0000 mg | Freq: Once | INTRAMUSCULAR | Status: AC
  Administered 2012-01-23: 4 mg via INTRAVENOUS
  Filled 2012-01-23: qty 2

## 2012-01-23 MED ORDER — FENTANYL CITRATE 0.05 MG/ML IJ SOLN
50.0000 ug | Freq: Once | INTRAMUSCULAR | Status: AC
  Administered 2012-01-23: 50 ug via INTRAVENOUS
  Filled 2012-01-23: qty 2

## 2012-01-23 NOTE — ED Notes (Signed)
Patient with abdominal pain, cramping in nature.  Patient states some nausea.  Patient states the pain is 10/10.  Patient crying in triage.

## 2012-01-23 NOTE — Telephone Encounter (Signed)
Patient called and left a message on the voicemail stating she had a BTL 4 years ago, had gallbladder surgery 06/2011, and needs to go to ER but not sure if should come to [facility] , [facility] or where. States having pregnancy like symptoms, pain feeling like braxton hicks contractions, periods usually 7 days heavy bleeding, now 3 days only need pantiliner, states having lots of pain.  States had 6 children, 3 miscariages and this feels odd, feels like pain in front of stomach that takes breath away, and hurt in vagina.  Called patient and verified she was seen in clinic before during last pregnancy and has had several MAU visits with our doctors.States doesn't have regular doctor because had lost her medicaid.  Informed patient is her choice where she goes, but based on her symptoms of severe pain ,  And her history that she should be seen asap.  And can be seen at either location, but if it something besides a ob/gyn problem better to be seen at [facility] or [facility]. And is hard to diagnose thru phone call. Informed her if she is seen at a location that can not meet her needs , they will send her to the location that meets her needs.

## 2012-01-23 NOTE — ED Provider Notes (Signed)
History     CSN: [id number]  Arrival date & time 01/23/12  2043   First MD Initiated Contact with Patient 01/23/12 2216      Chief Complaint  Patient presents with  . Abdominal Pain    `    (Consider location/radiation/quality/duration/timing/severity/associated sxs/prior treatment) HPI Comments: Patient here with RLQ abdominal pain.  Patient states that the pain started this morning, she states she has current pain and then it will increase and cresscendo up.  She denies fever, chills, reports nausea and decrease in appetite but no vomiting.  States no vaginal discharge or bleeding - she states that she is s/p BTL, that she was supposed to start her menstrual period 2 weeks ago and has not - she is concerned that she may be pregnant.  She is gravida 9, para 6, abortus 3.  She states history of ovarian cysts but this does not feel like her last one.  Patient states that she is concerned she may be pregnant, she states also with history of cholecystectomy without appendectomy.    Patient is a 31 y.o. female presenting with abdominal pain. The history is provided by the patient. No language interpreter was used.  Abdominal Pain The primary symptoms of the illness include abdominal pain and nausea. The primary symptoms of the illness do not include fever, fatigue, shortness of breath, vomiting, diarrhea, hematemesis, hematochezia, dysuria, vaginal discharge or vaginal bleeding. The current episode started 6 to 12 hours ago. The onset of the illness was gradual. The problem has been gradually worsening.  Pregnant Now: possibly - two weeks late. The patient has not had a change in bowel habit. Symptoms associated with the illness do not include chills, anorexia, diaphoresis, heartburn, constipation, urgency, hematuria, frequency or back pain.    Past Medical History  Diagnosis Date  . Diabetes mellitus     A2DM during pregnancy. now on metformin.   [name] Hypertension   . Stroke 2012  . SVT  (supraventricular tachycardia)     on propanalol  . Gonorrhea 2010    treated  . Chlamydia 2012    treated  . Pregnancy induced hypertension     pre E with pregnancies  . Asthma   . Rheumatoid arthritis     Past Surgical History  Procedure Date  . Tubal ligation 2009  . Finger surgery 1993    left ring finger to correct bone "problem"  . Cholecystectomy 07/22/2011    Procedure: LAPAROSCOPIC CHOLECYSTECTOMY WITH INTRAOPERATIVE CHOLANGIOGRAM;  Surgeon: Kandis Cocking, MD;  Location: [facility];  Service: General;  Laterality: N/A;  . Ercp 07/24/2011    Procedure: ENDOSCOPIC RETROGRADE CHOLANGIOPANCREATOGRAPHY (ERCP);  Surgeon: Theda Belfast;  Location: [facility];  Service: Endoscopy;  Laterality: N/A;    Family History  Problem Relation Age of Onset  . Anesthesia problems Mother     difficult to arouse, SOB    History  Substance Use Topics  . Smoking status: Current Everyday Smoker -- 0.2 packs/day for 4 years    Types: Cigarettes  . Smokeless tobacco: Not on file  . Alcohol Use: Yes     socially    OB History    Grav Para Term Preterm Abortions TAB SAB Ect Mult Living   9 6 0 6 3 0 3 0 0 6       Review of Systems  Constitutional: Negative for fever, chills, diaphoresis and fatigue.  Respiratory: Negative for shortness of breath.   Gastrointestinal: Positive for nausea and abdominal pain. Negative  for heartburn, vomiting, diarrhea, constipation, hematochezia, anorexia and hematemesis.  Genitourinary: Negative for dysuria, urgency, frequency, hematuria, vaginal bleeding and vaginal discharge.  Musculoskeletal: Negative for back pain.  All other systems reviewed and are negative.    Allergies  Amoxicillin and Dilaudid  Home Medications   Current Outpatient Rx  Name Route Sig Dispense Refill  . AMLODIPINE BESYLATE 5 MG PO TABS Oral Take 5 mg by mouth daily.    . ASPIRIN 81 MG PO TBEC Oral Take 81 mg by mouth daily.      . BECLOMETHASONE DIPROPIONATE 80 MCG/ACT  IN AERS Inhalation Inhale 2 puffs into the lungs 2 (two) times daily.    [name] LEVALBUTEROL TARTRATE 45 MCG/ACT IN AERO Inhalation Inhale 1-2 puffs into the lungs every 4 (four) hours as needed. For wheezing    . LISINOPRIL-HYDROCHLOROTHIAZIDE 20-25 MG PO TABS Oral Take 1 tablet by mouth daily.    . MULTI-VITAMIN/MINERALS PO TABS Oral Take 1 tablet by mouth daily.      [name] PANTOPRAZOLE SODIUM 40 MG PO TBEC Oral Take 40 mg by mouth daily.    [name] PROPRANOLOL HCL 10 MG PO TABS Oral Take 10 mg by mouth 3 (three) times daily.    [name] SIMVASTATIN 10 MG PO TABS Oral Take 10 mg by mouth at bedtime.        BP 139/73  Pulse 82  Temp(Src) 98.4 F (36.9 C) (Oral)  Resp 20  SpO2 100%  Physical Exam  Nursing note and vitals reviewed. Constitutional: She is oriented to person, place, and time. She appears well-developed and well-nourished. She appears distressed.       Uncomfortable appearing  HENT:  Head: Normocephalic and atraumatic.  Right Ear: External ear normal.  Left Ear: External ear normal.  Nose: Nose normal.  Mouth/Throat: Oropharynx is clear and moist. No oropharyngeal exudate.  Eyes: Conjunctivae are normal. Pupils are equal, round, and reactive to light. No scleral icterus.  Neck: Normal range of motion. Neck supple.  Cardiovascular: Normal rate, regular rhythm and normal heart sounds.  Exam reveals no gallop and no friction rub.   No murmur heard. Pulmonary/Chest: Effort normal and breath sounds normal. No respiratory distress. She has no wheezes. She has no rales. She exhibits no tenderness.  Abdominal: Soft. Bowel sounds are normal. She exhibits no distension and no mass. There is tenderness in the right lower quadrant. There is no rebound, no guarding and negative Murphy's sign.    Genitourinary: There is no rash or tenderness on the right labia. There is no rash or tenderness on the left labia. Uterus is not deviated, not enlarged and not tender. Cervix exhibits no motion tenderness and no  discharge. Right adnexum displays no mass, no tenderness and no fullness. Left adnexum displays no mass, no tenderness and no fullness. No bleeding around the vagina. No vaginal discharge found.       Exam limited by body habitus  Musculoskeletal: Normal range of motion. She exhibits no edema and no tenderness.  Lymphadenopathy:    She has no cervical adenopathy.  Neurological: She is alert and oriented to person, place, and time. No cranial nerve deficit. She exhibits normal muscle tone. Coordination normal.  Skin: Skin is warm and dry. No rash noted. No erythema. No pallor.  Psychiatric: She has a normal mood and affect. Her behavior is normal. Judgment and thought content normal.    ED Course  Procedures (including critical care time)  Labs Reviewed  CBC - Abnormal; Notable for the following:  Hemoglobin 11.7 (*)    HCT 35.5 (*)    All other components within normal limits  URINALYSIS, ROUTINE W REFLEX MICROSCOPIC - Abnormal; Notable for the following:    APPearance CLOUDY (*)    Bilirubin Urine SMALL (*)    Leukocytes, UA SMALL (*)    All other components within normal limits  URINE MICROSCOPIC-ADD ON - Abnormal; Notable for the following:    Squamous Epithelial / LPF MANY (*)    All other components within normal limits  DIFFERENTIAL  COMPREHENSIVE METABOLIC PANEL  LIPASE, BLOOD  POCT PREGNANCY, URINE  GC/CHLAMYDIA PROBE AMP, GENITAL  WET PREP, GENITAL   No results found.  Results for orders placed during the hospital encounter of 01/23/12  CBC      Component Value Range   WBC 8.5  4.0 - 10.5 (K/uL)   RBC 4.23  3.87 - 5.11 (MIL/uL)   Hemoglobin 11.7 (*) 12.0 - 15.0 (g/dL)   HCT 16.1 (*) 09.6 - 46.0 (%)   MCV 83.9  78.0 - 100.0 (fL)   MCH 27.7  26.0 - 34.0 (pg)   MCHC 33.0  30.0 - 36.0 (g/dL)   RDW 04.5  40.9 - 81.1 (%)   Platelets 206  150 - 400 (K/uL)  DIFFERENTIAL      Component Value Range   Neutrophils Relative 53  43 - 77 (%)   Neutro Abs 4.5  1.7 - 7.7  (K/uL)   Lymphocytes Relative 38  12 - 46 (%)   Lymphs Abs 3.2  0.7 - 4.0 (K/uL)   Monocytes Relative 8  3 - 12 (%)   Monocytes Absolute 0.7  0.1 - 1.0 (K/uL)   Eosinophils Relative 1  0 - 5 (%)   Eosinophils Absolute 0.1  0.0 - 0.7 (K/uL)   Basophils Relative 0  0 - 1 (%)   Basophils Absolute 0.0  0.0 - 0.1 (K/uL)  COMPREHENSIVE METABOLIC PANEL      Component Value Range   Sodium 139  135 - 145 (mEq/L)   Potassium 3.5  3.5 - 5.1 (mEq/L)   Chloride 103  96 - 112 (mEq/L)   CO2 25  19 - 32 (mEq/L)   Glucose, Bld 83  70 - 99 (mg/dL)   BUN 9  6 - 23 (mg/dL)   Creatinine, Ser 9.14  0.50 - 1.10 (mg/dL)   Calcium 9.4  8.4 - 78.2 (mg/dL)   Total Protein 7.3  6.0 - 8.3 (g/dL)   Albumin 3.6  3.5 - 5.2 (g/dL)   AST 12  0 - 37 (U/L)   ALT 10  0 - 35 (U/L)   Alkaline Phosphatase 76  39 - 117 (U/L)   Total Bilirubin 0.5  0.3 - 1.2 (mg/dL)   GFR calc non Af Amer >90  >90 (mL/min)   GFR calc Af Amer >90  >90 (mL/min)  LIPASE, BLOOD      Component Value Range   Lipase 14  11 - 59 (U/L)  URINALYSIS, ROUTINE W REFLEX MICROSCOPIC      Component Value Range   Color, Urine YELLOW  YELLOW    APPearance CLOUDY (*) CLEAR    Specific Gravity, Urine 1.025  1.005 - 1.030    pH 7.0  5.0 - 8.0    Glucose, UA NEGATIVE  NEGATIVE (mg/dL)   Hgb urine dipstick NEGATIVE  NEGATIVE    Bilirubin Urine SMALL (*) NEGATIVE    Ketones, ur NEGATIVE  NEGATIVE (mg/dL)   Protein, ur NEGATIVE  NEGATIVE (  mg/dL)   Urobilinogen, UA 1.0  0.0 - 1.0 (mg/dL)   Nitrite NEGATIVE  NEGATIVE    Leukocytes, UA SMALL (*) NEGATIVE   POCT PREGNANCY, URINE      Component Value Range   Preg Test, Ur NEGATIVE  NEGATIVE   WET PREP, GENITAL      Component Value Range   Yeast Wet Prep HPF POC NONE SEEN  NONE SEEN    Trich, Wet Prep NONE SEEN  NONE SEEN    Clue Cells Wet Prep HPF POC FEW (*) NONE SEEN    WBC, Wet Prep HPF POC FEW (*) NONE SEEN   URINE MICROSCOPIC-ADD ON      Component Value Range   Squamous Epithelial / LPF MANY  (*) RARE    WBC, UA 0-2  <3 (WBC/hpf)   RBC / HPF 0-2  <3 (RBC/hpf)   Bacteria, UA RARE  RARE    US Transvaginal Non-ob  01/24/2012  *RADIOLOGY REPORT*  Clinical Data: Right lower abdominal pain.  Irregular menses. 08/14 lead that  TRANSABDOMINAL AND TRANSVAGINAL ULTRASOUND OF PELVIS Technique:  Both transabdominal and transvaginal ultrasound examinations of the pelvis were performed. Transabdominal technique was performed for global imaging of the pelvis including uterus, ovaries, adnexal regions, and pelvic cul-de-sac.  Comparison: 04/10/2011   It was necessary to proceed with endovaginal exam following the transabdominal exam to visualize the uterus and ovaries.  Findings:  Uterus: The uterus is anteverted and measures about 9.8 x 5 x 7.2 cm.  No focal myometrial mass lesions are demonstrated.  Small cervical cysts consistent with Nabothian cysts.  Endometrium: Endometrial stripe thickness is normal, measuring about 6 mm.  No abnormal endometrial fluid collections. Homogeneous appearance.  Right ovary:  Right ovary measures 3.2 x 2.7 x 1.6 cm.  Normal follicular changes.  Flow is demonstrated within the right ovary on color flow Doppler imaging.  No abnormal adnexal masses.  Left ovary: Left ovary measures 2.9 x 1.3 x 2.3 cm.  Normal follicular changes are demonstrated.  Flow is demonstrated in the left ovary on color flow Doppler imaging.  No abnormal adnexal masses.  Other findings: No free fluid  IMPRESSION: Normal study. No evidence of pelvic mass or other significant abnormality.  No significant change since previous study.  Original Report Authenticated By: Marlon Pel, M.D.   US Pelvis Complete  01/24/2012  *RADIOLOGY REPORT*  Clinical Data: Right lower abdominal pain.  Irregular menses. 08/14 lead that  TRANSABDOMINAL AND TRANSVAGINAL ULTRASOUND OF PELVIS Technique:  Both transabdominal and transvaginal ultrasound examinations of the pelvis were performed. Transabdominal technique was  performed for global imaging of the pelvis including uterus, ovaries, adnexal regions, and pelvic cul-de-sac.  Comparison: 04/10/2011   It was necessary to proceed with endovaginal exam following the transabdominal exam to visualize the uterus and ovaries.  Findings:  Uterus: The uterus is anteverted and measures about 9.8 x 5 x 7.2 cm.  No focal myometrial mass lesions are demonstrated.  Small cervical cysts consistent with Nabothian cysts.  Endometrium: Endometrial stripe thickness is normal, measuring about 6 mm.  No abnormal endometrial fluid collections. Homogeneous appearance.  Right ovary:  Right ovary measures 3.2 x 2.7 x 1.6 cm.  Normal follicular changes.  Flow is demonstrated within the right ovary on color flow Doppler imaging.  No abnormal adnexal masses.  Left ovary: Left ovary measures 2.9 x 1.3 x 2.3 cm.  Normal follicular changes are demonstrated.  Flow is demonstrated in the left ovary on color flow Doppler  imaging.  No abnormal adnexal masses.  Other findings: No free fluid  IMPRESSION: Normal study. No evidence of pelvic mass or other significant abnormality.  No significant change since previous study.  Original Report Authenticated By: Marlon Pel, M.D.     No diagnosis found.    MDM  1:51 AM Patient continues to complain of RLQ abdominal pain - the pain remains colicky in nature so though I doubt appendicitis, we will get a CT scan.  Patient's care turned over to P. Dammen, PA-C         Izola [place], [place] 01/24/12 [phone]

## 2012-01-24 ENCOUNTER — Other Ambulatory Visit (HOSPITAL_COMMUNITY): Payer: Self-pay

## 2012-01-24 ENCOUNTER — Emergency Department (HOSPITAL_COMMUNITY): Payer: Self-pay

## 2012-01-24 MED ORDER — IOHEXOL 300 MG/ML  SOLN
20.0000 mL | INTRAMUSCULAR | Status: AC
  Administered 2012-01-24: 20 mL via ORAL

## 2012-01-24 MED ORDER — FENTANYL CITRATE 0.05 MG/ML IJ SOLN
50.0000 ug | Freq: Once | INTRAMUSCULAR | Status: AC
  Administered 2012-01-24: 50 ug via INTRAVENOUS
  Filled 2012-01-24: qty 2

## 2012-01-24 MED ORDER — IOHEXOL 300 MG/ML  SOLN
100.0000 mL | Freq: Once | INTRAMUSCULAR | Status: AC | PRN
  Administered 2012-01-24: 100 mL via INTRAVENOUS

## 2012-01-24 MED ORDER — ONDANSETRON HCL 4 MG/2ML IJ SOLN
4.0000 mg | Freq: Once | INTRAMUSCULAR | Status: AC
  Administered 2012-01-24: 4 mg via INTRAVENOUS
  Filled 2012-01-24: qty 2

## 2012-01-24 MED ORDER — ONDANSETRON 8 MG PO TBDP: ORAL_TABLET | ORAL | Status: DC

## 2012-01-24 MED ORDER — OXYCODONE-ACETAMINOPHEN 5-325 MG PO TABS: 1.0000 | ORAL_TABLET | Freq: Four times a day (QID) | ORAL | Status: DC | PRN

## 2012-01-24 NOTE — Discharge Instructions (Signed)
You were seen and evaluated today for your complaints of abdominal pains. Your lab testing, ultrasounds and CAT scan of your abdomen and pelvis have not shown any surrounding or emergent cause to your symptoms. The ovaries and uterus appear normal. The appendix was also normal without signs of infection. At this time your providers feel you may return home and followup with a primary care provider for continued evaluation and treatment.   Abdominal Pain Abdominal pain can be caused by many things. Your caregiver decides the seriousness of your pain by an examination and possibly blood tests and X-rays. Many cases can be observed and treated at home. Most abdominal pain is not caused by a disease and will probably improve without treatment. However, in many cases, more time must pass before a clear cause of the pain can be found. Before that point, it may not be known if you need more testing, or if hospitalization or surgery is needed. HOME CARE INSTRUCTIONS   Do not take laxatives unless directed by your caregiver.   Take pain medicine only as directed by your caregiver.   Only take over-the-counter or prescription medicines for pain, discomfort, or fever as directed by your caregiver.   Try a clear liquid diet (broth, tea, or water) for as long as directed by your caregiver. Slowly move to a bland diet as tolerated.  SEEK IMMEDIATE MEDICAL CARE IF:   The pain does not go away.   You have a fever.   You keep throwing up (vomiting).   The pain is felt only in portions of the abdomen. Pain in the right side could possibly be appendicitis. In an adult, pain in the left lower portion of the abdomen could be colitis or diverticulitis.   You pass bloody or black tarry stools.  MAKE SURE YOU:   Understand these instructions.   Will watch your condition.   Will get help right away if you are not doing well or get worse.  Document Released: 05/23/2005 Document Revised: 08/02/2011 Document  Reviewed: 03/31/2008 [facility] Patient Information 2012 [place], [place].    RESOURCE GUIDE  Dental Problems  Patients with Medicaid: [facility] [phone] [address]                                           [phone] [address] Phone:  [phone]                                                  Phone:  [phone]  If unable to pay or uninsured, contact:  [facility] or [facility]. to become qualified for the adult dental clinic.  Chronic Pain Problems Contact [facility]  [phone] Patients need to be referred by their primary care doctor.  Insufficient Money for Medicine Contact [employer]:  call "211" or [facility] ([phone].  No Primary Care Doctor Call [facility]  [phone] Other agencies that provide inexpensive medical care    [facility]  [phone]    [facility]  [phone]    [facility]  [phone]    Women's Clinic  [phone]    [facility]  [phone]    [facility]  [phone]  Psychological Services [facility]  [phone] [facility]  [phone] [facility]   [phone] (emergency services ([phone])  Substance Abuse Resources [facility]  (917) 457-2003 [facility] 5012431126 [facility] [phone] Floydene Flock [phone] Residential & Outpatient Substance Abuse Program  [phone]  Abuse/Neglect [facility] Child Abuse Hotline [phone] [facility] Child Abuse Hotline 239-583-4036 (After Hours)  Emergency Shelter [facility] [phone]  Maternity Homes Room at the [place] [facility] [phone] [facility] [phone]  MRSA Hotline #:   906-187-0203    [facility] Resources  [facility]     [employer]                          [facility] Dept. [address] [place]                       [address]      [address]  Blondell Reveal Phone:  [phone]                                   Phone:  [phone]                 Phone:  [phone]  [facility] Mental Health Phone:  [phone]  [facility] Child Abuse Hotline (878)753-4163 [phone] (After Hours)

## 2012-01-24 NOTE — ED Provider Notes (Signed)
Maria Carpenter S 1:00 AM patient discussed in sign out with Wylene Simmer PAC. Patient with persistent severe abdominal pains. Patient has CT pending. Patient has undergone pelvic examination was unremarkable. She also had ultrasound of pelvis without significant findings. No concerning lab findings, normal WBC. We'll plan disposition based on CT findings.   5:00 a.m. CT has resulted. There are no findings for acute infection, inflammation or other emergent condition to explain patient's abdominal pains. Appendix appears normal. No signs of colitis. No masses or enlargement of pelvic structures. At this time we'll discharge home with PCP followup.   Findings a treatment plan discussed with patient. Patient does state that she has had some issues with reflux following her cholecystectomy. She questions the possibility for gastric ulcers. I discussed that this may be a possibility and followup with the GI specialist for possible endoscopy may be helpful. They may be also able to perform colonoscopy to rule out any other forms of colitis. Patient will plan to touch base with PCP and followup as necessary. In the meantime patient advised to take daily and acid medications.  Angus Seller, [place] 01/24/12 [phone]

## 2012-01-24 NOTE — ED Provider Notes (Signed)
Medical screening examination/treatment/procedure(s) were performed by non-physician practitioner and as supervising physician I was immediately available for consultation/collaboration.   Devansh Riese L Simmie Garin, MD 01/24/12 2316 

## 2012-01-24 NOTE — ED Notes (Signed)
Pt. Currently at ultrasound with lady nt chaperone.

## 2012-01-24 NOTE — ED Notes (Signed)
PT DRINKING ORAL CONTRAST

## 2012-01-25 LAB — GC/CHLAMYDIA PROBE AMP, GENITAL
Chlamydia, DNA Probe: NEGATIVE
GC Probe Amp, Genital: NEGATIVE

## 2012-01-25 NOTE — ED Provider Notes (Signed)
Medical screening examination/treatment/procedure(s) were performed by non-physician practitioner and as supervising physician I was immediately available for consultation/collaboration.   Carleene Cooper III, MD 01/25/12 [phone]

## 2012-01-31 ENCOUNTER — Emergency Department (HOSPITAL_COMMUNITY): Payer: Self-pay

## 2012-01-31 ENCOUNTER — Emergency Department (HOSPITAL_COMMUNITY)
Admission: EM | Admit: 2012-01-31 | Discharge: 2012-01-31 | Disposition: A | Discharge status: Home / Self Care | Payer: Self-pay | Attending: Emergency Medicine | Admitting: Emergency Medicine

## 2012-01-31 ENCOUNTER — Encounter (HOSPITAL_COMMUNITY): Payer: Self-pay | Admitting: *Deleted

## 2012-01-31 DIAGNOSIS — E119 Type 2 diabetes mellitus without complications: Secondary | ICD-10-CM | POA: Insufficient documentation

## 2012-01-31 DIAGNOSIS — M79609 Pain in unspecified limb: Secondary | ICD-10-CM | POA: Insufficient documentation

## 2012-01-31 DIAGNOSIS — M069 Rheumatoid arthritis, unspecified: Secondary | ICD-10-CM | POA: Insufficient documentation

## 2012-01-31 DIAGNOSIS — S9030XA Contusion of unspecified foot, initial encounter: Secondary | ICD-10-CM | POA: Insufficient documentation

## 2012-01-31 DIAGNOSIS — F17200 Nicotine dependence, unspecified, uncomplicated: Secondary | ICD-10-CM | POA: Insufficient documentation

## 2012-01-31 DIAGNOSIS — Z8673 Personal history of transient ischemic attack (TIA), and cerebral infarction without residual deficits: Secondary | ICD-10-CM | POA: Insufficient documentation

## 2012-01-31 DIAGNOSIS — W2209XA Striking against other stationary object, initial encounter: Secondary | ICD-10-CM | POA: Insufficient documentation

## 2012-01-31 DIAGNOSIS — I10 Essential (primary) hypertension: Secondary | ICD-10-CM | POA: Insufficient documentation

## 2012-01-31 DIAGNOSIS — Y9301 Activity, walking, marching and hiking: Secondary | ICD-10-CM | POA: Insufficient documentation

## 2012-01-31 DIAGNOSIS — S91109A Unspecified open wound of unspecified toe(s) without damage to nail, initial encounter: Secondary | ICD-10-CM | POA: Insufficient documentation

## 2012-01-31 MED ORDER — HYDROCODONE-ACETAMINOPHEN 5-325 MG PO TABS
1.0000 | ORAL_TABLET | Freq: Once | ORAL | Status: AC
  Administered 2012-01-31: 1 via ORAL
  Filled 2012-01-31: qty 1

## 2012-01-31 MED ORDER — IBUPROFEN 800 MG PO TABS: 800.0000 mg | ORAL_TABLET | Freq: Three times a day (TID) | ORAL | Status: AC | PRN

## 2012-01-31 NOTE — ED Notes (Signed)
The pt was walking in the rain and her foot slipped and her foot struck a pole ans she has pain inf all her toes with a small abrasion on one

## 2012-01-31 NOTE — ED Provider Notes (Signed)
History     CSN: [id number]  Arrival date & time 01/31/12  1520   First MD Initiated Contact with Patient 01/31/12 1608      Chief Complaint  Patient presents with  . Foot Injury    (Consider location/radiation/quality/duration/timing/severity/associated sxs/prior treatment) HPI Comments: Patient reports she was walking in the rain and her leg went weak (common problem from previous stroke) and her foot slipped into a concrete podium.  Reports pain in her first 3 toes and the forefoot.   Pain is exacerbated by palpation, by any attempt to bear weight. Denies other injury.  She did not fall and did not hit her head.    Patient is a 31 y.o. female presenting with foot injury. The history is provided by the patient.  Foot Injury  Pertinent negatives include no numbness.    Past Medical History  Diagnosis Date  . Diabetes mellitus     A2DM during pregnancy. now on metformin.   [name] Hypertension   . Stroke 2012  . SVT (supraventricular tachycardia)     on propanalol  . Gonorrhea 2010    treated  . Chlamydia 2012    treated  . Pregnancy induced hypertension     pre E with pregnancies  . Asthma   . Rheumatoid arthritis     Past Surgical History  Procedure Date  . Tubal ligation 2009  . Finger surgery 1993    left ring finger to correct bone "problem"  . Cholecystectomy 07/22/2011    Procedure: LAPAROSCOPIC CHOLECYSTECTOMY WITH INTRAOPERATIVE CHOLANGIOGRAM;  Surgeon: Kandis Cocking, MD;  Location: [facility];  Service: General;  Laterality: N/A;  . Ercp 07/24/2011    Procedure: ENDOSCOPIC RETROGRADE CHOLANGIOPANCREATOGRAPHY (ERCP);  Surgeon: Theda Belfast;  Location: [facility];  Service: Endoscopy;  Laterality: N/A;    Family History  Problem Relation Age of Onset  . Anesthesia problems Mother     difficult to arouse, SOB    History  Substance Use Topics  . Smoking status: Current Everyday Smoker -- 0.2 packs/day for 4 years    Types: Cigarettes  . Smokeless tobacco:  Not on file  . Alcohol Use: Yes     socially    OB History    Grav Para Term Preterm Abortions TAB SAB Ect Mult Living   9 6 0 6 3 0 3 0 0 6       Review of Systems  Musculoskeletal: Negative for back pain.  Skin: Positive for wound.  Neurological: Negative for syncope, weakness and numbness.    Allergies  Amoxicillin and Dilaudid  Home Medications   Current Outpatient Rx  Name Route Sig Dispense Refill  . AMLODIPINE BESYLATE 5 MG PO TABS Oral Take 5 mg by mouth daily.    . ASPIRIN 81 MG PO TBEC Oral Take 81 mg by mouth daily.      . BECLOMETHASONE DIPROPIONATE 80 MCG/ACT IN AERS Inhalation Inhale 2 puffs into the lungs 2 (two) times daily.    [name] LEVALBUTEROL TARTRATE 45 MCG/ACT IN AERO Inhalation Inhale 1-2 puffs into the lungs every 4 (four) hours as needed. For wheezing    . LISINOPRIL-HYDROCHLOROTHIAZIDE 20-25 MG PO TABS Oral Take 1 tablet by mouth daily.    . MULTI-VITAMIN/MINERALS PO TABS Oral Take 1 tablet by mouth daily.      [name] ONDANSETRON 8 MG PO TBDP Oral Take 8 mg by mouth every 8 (eight) hours as needed. For nausea    . PANTOPRAZOLE SODIUM 40 MG PO TBEC Oral  Take 40 mg by mouth daily.    [name] PROPRANOLOL HCL 10 MG PO TABS Oral Take 10 mg by mouth 3 (three) times daily.      BP 141/98  Pulse 80  Temp(Src) 98.1 F (36.7 C) (Oral)  Resp 18  Ht 5' 6.5" (1.689 m)  SpO2 100%  LMP 12/25/2011  Physical Exam  Nursing note and vitals reviewed. Constitutional: She appears well-developed and well-nourished. No distress.  HENT:  Head: Normocephalic and atraumatic.  Neck: Neck supple.  Pulmonary/Chest: Effort normal.  Musculoskeletal:       Right ankle: Normal.       Right lower leg: Normal.       Feet:  Neurological: She is alert.  Skin: She is not diaphoretic.    ED Course  Procedures (including critical care time)  Labs Reviewed - No data to display Dg Foot Complete Right  01/31/2012  *RADIOLOGY REPORT*  Clinical Data: Dorsal right foot pain and right  second toe abrasion and numbness following an injury.  RIGHT FOOT COMPLETE - 3+ VIEW  Comparison: 05/16/2008.  Findings: Distal soft tissue swelling.  No fracture or dislocation seen.  Calcaneal spurs.  IMPRESSION: No fracture or dislocation.  Original Report Authenticated By: Darrol Angel, M.D.     1. Foot contusion       MDM  Patient with injury to foot sliding on wet pavement into concrete.  Small skin tear on toe - no need for suture repair.  Tetanus vs within the past 4 years per patient.  Likely contusion.  Wound care, ace wrap, crutches given in ED, also #1 norco.  Pt d/c home with ibuprofen, RICE treatment, PCP follow up. Patient verbalizes understanding and agrees with plan.          Dillard Cannon [place], [place] 01/31/12 2036

## 2012-01-31 NOTE — Discharge Instructions (Signed)
Read the information below.  Follow up with your primary care provider.  You may return to the ER at any time for worsening condition or any new symptoms that concern you.   Contusion A contusion is a deep bruise. Contusions happen when an injury causes bleeding under the skin. Signs of bruising include pain, puffiness (swelling), and discolored skin. The contusion may turn blue, purple, or yellow. HOME CARE   Put ice on the injured area.   Put ice in a plastic bag.   Place a towel between your skin and the bag.   Leave the ice on for 15 to 20 minutes, 3 to 4 times a day.   Only take medicine as told by your doctor.   Rest the injured area.   If possible, raise (elevate) the injured area to lessen puffiness.  GET HELP RIGHT AWAY IF:   You have more bruising or puffiness.   You have pain that is getting worse.   Your puffiness or pain is not helped by medicine.  MAKE SURE YOU:   Understand these instructions.   Will watch your condition.   Will get help right away if you are not doing well or get worse.  Document Released: 01/30/2008 Document Revised: 08/02/2011 Document Reviewed: 06/18/2011 [facility] Patient Information 2012 [place], [place].

## 2012-01-31 NOTE — Progress Notes (Signed)
Orthopedic Tech Progress Note Patient Details:  Maria Carpenter 1980/09/19 [id number]  Ortho Devices Type of Ortho Device: Crutches;Ace wrap Ortho Device/Splint Location: (R) LE Ortho Device/Splint Interventions: Application   Jennye Moccasin 01/31/2012, 4:58 PM

## 2012-02-03 NOTE — ED Provider Notes (Signed)
Medical screening examination/treatment/procedure(s) were performed by non-physician practitioner and as supervising physician I was immediately available for consultation/collaboration.   Benny Lennert, MD 02/03/12 [phone]

## 2012-06-28 ENCOUNTER — Inpatient Hospital Stay (HOSPITAL_COMMUNITY): Payer: Self-pay

## 2012-06-28 ENCOUNTER — Inpatient Hospital Stay (HOSPITAL_COMMUNITY)
Admission: AD | Admit: 2012-06-28 | Discharge: 2012-06-29 | Disposition: A | Discharge status: Home / Self Care | Payer: Self-pay | Source: Ambulatory Visit | Attending: Family Medicine | Admitting: Family Medicine

## 2012-06-28 ENCOUNTER — Encounter (HOSPITAL_COMMUNITY): Payer: Self-pay | Admitting: *Deleted

## 2012-06-28 DIAGNOSIS — R51 Headache: Secondary | ICD-10-CM | POA: Insufficient documentation

## 2012-06-28 DIAGNOSIS — R1031 Right lower quadrant pain: Secondary | ICD-10-CM | POA: Insufficient documentation

## 2012-06-28 DIAGNOSIS — R42 Dizziness and giddiness: Secondary | ICD-10-CM | POA: Insufficient documentation

## 2012-06-28 DIAGNOSIS — N949 Unspecified condition associated with female genital organs and menstrual cycle: Secondary | ICD-10-CM | POA: Insufficient documentation

## 2012-06-28 LAB — URINALYSIS, ROUTINE W REFLEX MICROSCOPIC
Bilirubin Urine: NEGATIVE
Glucose, UA: NEGATIVE mg/dL
Hgb urine dipstick: NEGATIVE
Ketones, ur: 15 mg/dL — AB
Leukocytes, UA: NEGATIVE
Nitrite: NEGATIVE
Protein, ur: NEGATIVE mg/dL
Specific Gravity, Urine: >1.03 — ABNORMAL HIGH (ref 1.005–1.030)
Urobilinogen, UA: 0.2 mg/dL (ref 0.0–1.0)
pH: 6 (ref 5.0–8.0)

## 2012-06-28 LAB — POCT PREGNANCY, URINE: Preg Test, Ur: NEGATIVE

## 2012-06-28 MED ORDER — KETOROLAC TROMETHAMINE 60 MG/2ML IM SOLN
60.0000 mg | Freq: Once | INTRAMUSCULAR | Status: AC
  Administered 2012-06-28: 60 mg via INTRAMUSCULAR
  Filled 2012-06-28: qty 2

## 2012-06-28 NOTE — MAU Provider Note (Signed)
History     CSN: [id number]  Arrival date and time: 06/28/12 2031   None     No chief complaint on file.  HPI  No period since 04/04/2012, some spotting of blood in September after intercourse, but none since. History of bilateral tubal ligation in 2009.  Reports pain in the RLQ when sitting and shooting pain up vagina that radiates up to navel. Also reports some dizziness when getting up to fast and headaches (frontal) that started this past week.  Headache is described as a dull pain and rated a 6/10.  Reports problems with memory in past 3-4 months.  No speech deficits noted.     Past Medical History  Diagnosis Date  . Diabetes mellitus     A2DM during pregnancy. now on metformin.   [name] Hypertension   . Stroke 2012  . SVT (supraventricular tachycardia)     on propanalol  . Gonorrhea 2010    treated  . Chlamydia 2012    treated  . Pregnancy induced hypertension     pre E with pregnancies  . Asthma   . Rheumatoid arthritis     Past Surgical History  Procedure Date  . Tubal ligation 2009  . Finger surgery 1993    left ring finger to correct bone "problem"  . Cholecystectomy 07/22/2011    Procedure: LAPAROSCOPIC CHOLECYSTECTOMY WITH INTRAOPERATIVE CHOLANGIOGRAM;  Surgeon: Kandis Cocking, MD;  Location: [facility];  Service: General;  Laterality: N/A;  . Ercp 07/24/2011    Procedure: ENDOSCOPIC RETROGRADE CHOLANGIOPANCREATOGRAPHY (ERCP);  Surgeon: Theda Belfast;  Location: [facility];  Service: Endoscopy;  Laterality: N/A;    Family History  Problem Relation Age of Onset  . Anesthesia problems Mother     difficult to arouse, SOB  . Diabetes Mother   . Hypertension Mother   . Stroke Mother   . Diabetes Father   . Hypertension Father   . Heart disease Father   . Cancer Maternal Aunt   . Cancer Maternal Grandmother     History  Substance Use Topics  . Smoking status: Current Every Day Smoker -- 0.2 packs/day for 4 years    Types: Cigarettes  . Smokeless tobacco:  Not on file  . Alcohol Use: Yes     socially    Allergies:  Allergies  Allergen Reactions  . Amoxicillin Anaphylaxis and Rash    Facial swelling  . Dilaudid (Hydromorphone Hcl) Itching    Prescriptions prior to admission  Medication Sig Dispense Refill  . albuterol (PROVENTIL) (2.5 MG/3ML) 0.083% nebulizer solution Take 2.5 mg by nebulization every 6 (six) hours as needed.      [name] amLODipine (NORVASC) 5 MG tablet Take 5 mg by mouth daily.      [name] aspirin 81 MG EC tablet Take 81 mg by mouth daily.        . beclomethasone (QVAR) 80 MCG/ACT inhaler Inhale 2 puffs into the lungs 2 (two) times daily.      [name] ibuprofen (ADVIL,MOTRIN) 800 MG tablet Take 800 mg by mouth every 8 (eight) hours as needed.      [name] lisinopril-hydrochlorothiazide (PRINZIDE,ZESTORETIC) 20-25 MG per tablet Take 1 tablet by mouth daily.      . Multiple Vitamins-Minerals (MULTIVITAMIN WITH MINERALS) tablet Take 1 tablet by mouth daily.        . pantoprazole (PROTONIX) 40 MG tablet Take 40 mg by mouth daily.      [name] levalbuterol (XOPENEX HFA) 45 MCG/ACT inhaler Inhale 1-2 puffs into the  lungs every 4 (four) hours as needed. For wheezing      . ondansetron (ZOFRAN-ODT) 8 MG disintegrating tablet Take 8 mg by mouth every 8 (eight) hours as needed. For nausea      . propranolol (INDERAL) 10 MG tablet Take 10 mg by mouth 3 (three) times daily.        Review of Systems  Constitutional: Negative.   Eyes: Negative.   Respiratory: Negative.   Cardiovascular: Negative.   Gastrointestinal: Positive for nausea and abdominal pain (pelvic). Negative for vomiting.  Genitourinary: Negative.   Neurological: Positive for dizziness and headaches. Negative for speech change, focal weakness, seizures and loss of consciousness.   Physical Exam   Blood pressure 127/56, pulse 91, temperature 98.2 F (36.8 C), temperature source Oral, resp. rate 20, height 5' 6.5" (1.689 m), weight 153.225 kg (337 lb 12.8 oz), last menstrual period  04/04/2012.  Physical Exam  Constitutional: She is oriented to person, place, and time. She appears well-developed and well-nourished. No distress.  HENT:  Head: Normocephalic.  Eyes: Pupils are equal, round, and reactive to light.  Neck: Normal range of motion. Neck supple.  Cardiovascular: Normal rate, regular rhythm and normal heart sounds.   Respiratory: Effort normal and breath sounds normal.  GI: Soft. There is no tenderness.  Musculoskeletal: Normal range of motion.  Neurological: She is alert and oriented to person, place, and time. She has normal reflexes. No cranial nerve deficit or sensory deficit.       Right hand grip less than left (prior stroke)  Skin: Skin is warm and dry.  Psychiatric: She has a normal mood and affect. Her behavior is normal. Judgment and thought content normal.    MAU Course  Procedures Toradol 60 mg IM  Consulted with Dr. Manus Gunning, reviewed HPI/Exam; does not feel it's acute stroke, ok to evaluate pelvic pain and send have additional symptoms (headache, dizziness) evaluated at a later time.  Ultrasound: Normal study. No evidence of pelvic mass or other significant abnormality. No significant changes since previous study.  After pelvic ultrasound complete, called Dr. Manus Gunning and reviewed results and patient stability (no problems at the time)>pt does not have to go to [facility], however should receive follow-up for recurring symptoms > pt states will follow-up tomorrow at [facility].    Assessment and Plan  Pelvic Pain Headache/Dizziness  Plan: Follow-up at [facility] tomorrow Schedule appt in Gyn clinic for missed menses Reviewed warning signs for stroke  [facility] [place] 06/28/2012, 10:00 PM

## 2012-06-28 NOTE — MAU Note (Signed)
No period sin. Pce 04/04/2012. One pos and one neg upt. Pain RLQ and if sit, have shooting pain up vagina and up to navel. Spotting in Sept after intercourse x 2 but none since

## 2012-06-28 NOTE — Progress Notes (Signed)
Some dizziness and headaches related to prior stroke

## 2012-06-29 NOTE — MAU Note (Signed)
Down time of Epic before pt d/c to home. See paper chart.

## 2012-06-30 NOTE — MAU Provider Note (Signed)
Chart reviewed and agree with management and plan.  

## 2012-07-01 ENCOUNTER — Encounter (HOSPITAL_COMMUNITY): Payer: Self-pay | Admitting: *Deleted

## 2012-07-01 ENCOUNTER — Observation Stay (HOSPITAL_COMMUNITY)
Admission: EM | Admit: 2012-07-01 | Discharge: 2012-07-02 | Disposition: A | Discharge status: Home / Self Care | Payer: Self-pay | Attending: Family Medicine | Admitting: Family Medicine

## 2012-07-01 ENCOUNTER — Emergency Department (HOSPITAL_COMMUNITY): Payer: Self-pay

## 2012-07-01 DIAGNOSIS — K8042 Calculus of bile duct with acute cholecystitis without obstruction: Secondary | ICD-10-CM

## 2012-07-01 DIAGNOSIS — R29898 Other symptoms and signs involving the musculoskeletal system: Principal | ICD-10-CM | POA: Insufficient documentation

## 2012-07-01 DIAGNOSIS — E119 Type 2 diabetes mellitus without complications: Secondary | ICD-10-CM

## 2012-07-01 DIAGNOSIS — R109 Unspecified abdominal pain: Secondary | ICD-10-CM | POA: Insufficient documentation

## 2012-07-01 DIAGNOSIS — E669 Obesity, unspecified: Secondary | ICD-10-CM

## 2012-07-01 DIAGNOSIS — G4733 Obstructive sleep apnea (adult) (pediatric): Secondary | ICD-10-CM

## 2012-07-01 DIAGNOSIS — K219 Gastro-esophageal reflux disease without esophagitis: Secondary | ICD-10-CM

## 2012-07-01 DIAGNOSIS — Z8673 Personal history of transient ischemic attack (TIA), and cerebral infarction without residual deficits: Secondary | ICD-10-CM

## 2012-07-01 DIAGNOSIS — R112 Nausea with vomiting, unspecified: Secondary | ICD-10-CM | POA: Insufficient documentation

## 2012-07-01 DIAGNOSIS — M069 Rheumatoid arthritis, unspecified: Secondary | ICD-10-CM

## 2012-07-01 DIAGNOSIS — G459 Transient cerebral ischemic attack, unspecified: Secondary | ICD-10-CM

## 2012-07-01 DIAGNOSIS — G5600 Carpal tunnel syndrome, unspecified upper limb: Secondary | ICD-10-CM

## 2012-07-01 DIAGNOSIS — I69998 Other sequelae following unspecified cerebrovascular disease: Secondary | ICD-10-CM | POA: Insufficient documentation

## 2012-07-01 DIAGNOSIS — H538 Other visual disturbances: Secondary | ICD-10-CM | POA: Insufficient documentation

## 2012-07-01 DIAGNOSIS — I1A0 Resistant hypertension: Secondary | ICD-10-CM

## 2012-07-01 DIAGNOSIS — R51 Headache: Secondary | ICD-10-CM | POA: Insufficient documentation

## 2012-07-01 DIAGNOSIS — I10 Essential (primary) hypertension: Secondary | ICD-10-CM

## 2012-07-01 DIAGNOSIS — R531 Weakness: Secondary | ICD-10-CM

## 2012-07-01 HISTORY — DX: Anemia, unspecified: D64.9

## 2012-07-01 HISTORY — DX: Family history of other specified conditions: Z84.89

## 2012-07-01 HISTORY — DX: Adverse effect of unspecified anesthetic, initial encounter: T41.45XA

## 2012-07-01 HISTORY — DX: Gastro-esophageal reflux disease without esophagitis: K21.9

## 2012-07-01 HISTORY — DX: Other complications of anesthesia, initial encounter: T88.59XA

## 2012-07-01 HISTORY — DX: Sleep apnea, unspecified: G47.30

## 2012-07-01 HISTORY — DX: Shortness of breath: R06.02

## 2012-07-01 LAB — CBC WITH DIFFERENTIAL/PLATELET
Basophils Absolute: 0 10*3/uL (ref 0.0–0.1)
Basophils Relative: 0 % (ref 0–1)
Eosinophils Absolute: 0.2 10*3/uL (ref 0.0–0.7)
Eosinophils Relative: 2 % (ref 0–5)
HCT: 37.7 % (ref 36.0–46.0)
Hemoglobin: 13 g/dL (ref 12.0–15.0)
Lymphocytes Relative: 37 % (ref 12–46)
Lymphs Abs: 2.8 10*3/uL (ref 0.7–4.0)
MCH: 29.1 pg (ref 26.0–34.0)
MCHC: 34.5 g/dL (ref 30.0–36.0)
MCV: 84.3 fL (ref 78.0–100.0)
Monocytes Absolute: 0.6 10*3/uL (ref 0.1–1.0)
Monocytes Relative: 8 % (ref 3–12)
Neutro Abs: 3.9 10*3/uL (ref 1.7–7.7)
Neutrophils Relative %: 52 % (ref 43–77)
Platelets: 211 10*3/uL (ref 150–400)
RBC: 4.47 MIL/uL (ref 3.87–5.11)
RDW: 14.4 % (ref 11.5–15.5)
WBC: 7.4 10*3/uL (ref 4.0–10.5)

## 2012-07-01 LAB — BASIC METABOLIC PANEL
BUN: 15 mg/dL (ref 6–23)
CO2: 27 mEq/L (ref 19–32)
Calcium: 9.6 mg/dL (ref 8.4–10.5)
Chloride: 101 mEq/L (ref 96–112)
Creatinine, Ser: 0.75 mg/dL (ref 0.50–1.10)
GFR calc Af Amer: >90 mL/min (ref >90)
GFR calc non Af Amer: >90 mL/min (ref >90)
Glucose, Bld: 85 mg/dL (ref 70–99)
Potassium: 3.6 mEq/L (ref 3.5–5.1)
Sodium: 138 mEq/L (ref 135–145)

## 2012-07-01 LAB — GLUCOSE, CAPILLARY
Glucose-Capillary: 89 mg/dL (ref 70–99)
Glucose-Capillary: 76 mg/dL (ref 70–99)

## 2012-07-01 MED ORDER — METOPROLOL TARTRATE 1 MG/ML IV SOLN: 5.0000 mg | Freq: Once | INTRAVENOUS | Status: DC

## 2012-07-01 NOTE — ED Notes (Signed)
MDs at bedside

## 2012-07-01 NOTE — ED Notes (Signed)
Pt also reports feet swelling after sitting and low BP and dizziness at times.  Pt also reports nausea.

## 2012-07-01 NOTE — ED Provider Notes (Signed)
History     CSN: [id number]  Arrival date & time 07/01/12  1801   First MD Initiated Contact with Patient 07/01/12 2131      No chief complaint on file.   (Consider location/radiation/quality/duration/timing/severity/associated sxs/prior treatment) HPI Maria Carpenter is a 31 y.o. female with an extensive medical history per her report. Patient says she had a stroke leaving her right-sided deficits last year March 2012 and was admitted in this hospital. In addition she has a history of diabetes type 2 on metformin, also juvenile type rheumatoid arthritis for which he takes ibuprofen frequently- she has in fact stop taking her aspirin because the ibuprofen and aspirin were upsetting her stomach. Her symptoms began on Thursday with worsening right-sided weakness. She feels like she cannot manipulate small objects and she cannot walk as well. She's also complaining balance issues, difficulty walking due to right-sided weakness even though she walks with a walker, she has trouble occasionally comprehending speech, blurred vision in both eyes, complains of stutter and nausea and vomiting times one month. Patient thinks that she's been changing her diet up and eating more dairy products which may be causing the nausea and vomiting. However she's also been complaining about vertigo-type symptoms as well.  Patient was seen a couple days ago at [facility] because she has had amenorrhea (last menstrual period August 19)- patient was ultrasounded found no cysts, and no ectopic pregnancy. Patient has had a bilateral tubal ligation.  Patient is also been complaining about elevated blood pressures but she's not been taking her propranolol for a few weeks and she has been out.  Also complaining about abdominal pain starts in the right lower quadrant to middle abdomen, it is intermittent, it happens worse with sitting. [facility]  She's also complaining about headaches, she says these  are frontal headaches and they do not feel like her typical migraine, they're behind her eyes, are throbbing if you like her eyes are swollen. She did not have any nausea vomiting or photophobia. She says her weaknesses are not related to her headaches.   Past Medical History  Diagnosis Date  . Diabetes mellitus     A2DM during pregnancy. now on metformin.   [name] Hypertension   . Gonorrhea 2010    treated  . Chlamydia 2012    treated  . Pregnancy induced hypertension     pre E with pregnancies  . Asthma   . Rheumatoid arthritis   . 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 SVT (supraventricular tachycardia)     Past Surgical History  Procedure Date  . Tubal ligation 2009  . Finger surgery 1993    left ring finger to correct bone "problem"  . Cholecystectomy 07/22/2011    Procedure: LAPAROSCOPIC CHOLECYSTECTOMY WITH INTRAOPERATIVE CHOLANGIOGRAM;  Surgeon: Kandis Cocking, MD;  Location: [facility];  Service: General;  Laterality: N/A;  . Ercp 07/24/2011    Procedure: ENDOSCOPIC RETROGRADE CHOLANGIOPANCREATOGRAPHY (ERCP);  Surgeon: Theda Belfast;  Location: [facility];  Service: Endoscopy;  Laterality: N/A;    Family History  Problem Relation Age of Onset  . Anesthesia problems Mother     difficult to arouse, SOB  . Diabetes Mother   . Hypertension Mother   . Stroke Mother   . Diabetes Father   . Hypertension Father   . Heart disease Father   . Cancer Maternal Aunt   . Cancer Maternal Grandmother     History  Substance Use Topics  . Smoking status: Current Every Day Smoker -- 0.2 packs/day for 4 years    Types: Cigarettes  . Smokeless tobacco: Not on file  . Alcohol Use: Yes     Comment: socially    OB History    Grav Para Term Preterm Abortions TAB SAB Ect Mult Living   9 6 0 6 3 0 3 0 0 6       Review of Systems At least 10pt or greater review of systems completed and are negative except where specified in the  HPI.  Allergies  Amoxicillin and Dilaudid  Home Medications   Current Outpatient Rx  Name  Route  Sig  Dispense  Refill  . ALBUTEROL SULFATE HFA 108 (90 BASE) MCG/ACT IN AERS   Inhalation   Inhale 2 puffs into the lungs every 6 (six) hours as needed. For wheezing         . ALBUTEROL SULFATE (2.5 MG/3ML) 0.083% IN NEBU   Nebulization   Take 2.5 mg by nebulization every 6 (six) hours as needed. For wheezing         . AMLODIPINE BESYLATE 5 MG PO TABS   Oral   Take 5 mg by mouth daily.         . BECLOMETHASONE DIPROPIONATE 80 MCG/ACT IN AERS   Inhalation   Inhale 2 puffs into the lungs 2 (two) times daily.         [name] PEPCID AC PO   Oral   Take 1 tablet by mouth daily.         . IBUPROFEN 800 MG PO TABS   Oral   Take 800 mg by mouth every 8 (eight) hours as needed. For pain         . LISINOPRIL-HYDROCHLOROTHIAZIDE 20-25 MG PO TABS   Oral   Take 1 tablet by mouth daily.         . MULTI-VITAMIN/MINERALS PO TABS   Oral   Take 1 tablet by mouth daily.           [name] PROPRANOLOL HCL 10 MG PO TABS   Oral   Take 10 mg by mouth 3 (three) times daily.         [name] RANITIDINE HCL 150 MG PO TABS   Oral   Take 150 mg by mouth daily as needed. For heartburn when pepcid does not work           BP 130/95  Pulse 90  Temp 98.8 F (37.1 C) (Oral)  Resp 18  SpO2 100%  LMP 04/04/2012  Physical Exam  PHYSICAL EXAM: VITAL SIGNS:  . Filed Vitals:   07/01/12 2245 07/01/12 2300 07/01/12 2315 07/01/12 2330  BP: 130/75 116/52 121/53 133/66  Pulse: 82 74 74 80  Temp:      TempSrc:      Resp:      SpO2: 100% 98% 100% 100%   CONSTITUTIONAL: Awake, oriented, appears non-toxic HENT: Atraumatic, normocephalic, oral mucosa pink and moist, airway patent. Nares patent without drainage. External ears normal. EYES: Conjunctiva clear, EOMI, PERRLA NECK: Trachea midline, non-tender, supple CARDIOVASCULAR: Normal heart rate, Normal rhythm, No murmurs, rubs,  gallops PULMONARY/CHEST: Clear to auscultation, no rhonchi, wheezes, or rales. Symmetrical breath sounds. CHEST WALL: No lesions. Non-tender. ABDOMINAL: Non-distended, morbidly obese, soft, non-tender - no rebound or guarding.  BS normal. NEUROLOGIC: ZO:XWRUEA fields intact.  Facial sensation equal to light touch bilaterally.  Good muscle bulk in the masseter muscle and good lateral movement of  the jaw.  Facial expressions equal and good strength with smile/frown and puffed cheeks.  Hearing grossly intact to finger rub test.  Uvula, tongue are midline with no deviation. Symmetrical palate elevation.  Trapezius and SCM muscles are 5/5 strength bilaterally.   DTR: Brachioradialis, biceps, patellar, Achilles tendon reflexes 2+ bilaterally.  No clonus. Strength: 5/5 strength flexors and extensors in the left upper and lower extremities.  Grip strength, finger adduction/abduction 5/5 on the left.  Patient feels weak on the right 4+ out of 5 in flexors and extensors, seems exaggerated lower extremity. Sensation: Sensation intact distally to light touch, proprioception using position testing of 2nd digit and great toe Cerebellar: No ataxia with walking or dysmetria with finger to nose, rapid alternating hand movements and heels to shin testing. Gait and Station:Negative Romberg, no pronator drift EXTREMITIES: No clubbing, cyanosis, or edema SKIN: Warm, Dry, No erythema, No rash   ED Course  Procedures (including critical care time)   Labs Reviewed  BASIC METABOLIC PANEL  CBC WITH DIFFERENTIAL  GLUCOSE, CAPILLARY  GLUCOSE, CAPILLARY   Ct Head Wo Contrast  07/01/2012  *RADIOLOGY REPORT*  Clinical Data: Hypertension, memory loss, right-sided weakness  CT HEAD WITHOUT CONTRAST  Technique:  Contiguous axial images were obtained from the base of the skull through the vertex without contrast.  Comparison: 04/24/2011  Findings: No evidence of parenchymal hemorrhage or extra-axial fluid collection. No mass  lesion, mass effect, or midline shift.  No CT evidence of acute infarction.  Cerebral volume is age appropriate.  No ventriculomegaly.  The visualized paranasal sinuses are essentially clear. The mastoid air cells are unopacified.  No evidence of calvarial fracture.  IMPRESSION: No evidence of acute intracranial abnormality.   Original Report Authenticated By: Charline Bills, M.D.      No diagnosis found.    MDM  Maria Carpenter is a 31 y.o. female strokelike symptoms since last Thursday. She says she was evaluated 2 days ago at [facility] and was told to come to the ER for evaluation of stroke however she did not because she could not get childcare. This seems highly suspicious to me.  Patient does have multiple complaints on top of these strokelike symptoms. She does feel weak on the right side however, there is unequal strength deficits between biceps and triceps and between more proximal and more distal muscle groups in the lower extremity. Patient CT of the head was within normal limits.  Patient workup is fairly unremarkable, I do think the patient does need an MRI to rule out organic causes of her neurologic deficits. I think there is a strong possibility that there could be headache component versus psychogenic component to her neurologic manifestations.  Discussed with Dr. Roseanne Reno of neurology and hospitalist for admission.      Jones Skene, MD 07/02/12 1610

## 2012-07-01 NOTE — ED Notes (Signed)
[facility] and were concerned she may have had another stroke because of blood pressure.  Pt was unable to get here.  Symptoms:  Memory off, word stuttering, headaches, increased weakness on right side.  Pt had weakness on right side from previous stroke in March of 2012.  Symptoms started on Friday.

## 2012-07-01 NOTE — ED Notes (Signed)
Pt reports HA, abd pain, blurry vision for past week.  Pt states she recently had pregnancy test and was negative.

## 2012-07-01 NOTE — ED Notes (Signed)
CBG 76 

## 2012-07-01 NOTE — ED Notes (Signed)
Pt is having feeling in upper thighs tingling adn concerned for another blood clot

## 2012-07-01 NOTE — ED Notes (Signed)
MD at bedside. 

## 2012-07-01 NOTE — ED Notes (Signed)
Patient up to desk reporting worsening of symptoms.  Patient has had TIAs in the past.  Patient taken into triage for re-assessment at this time by Selena Batten, RN.

## 2012-07-02 ENCOUNTER — Observation Stay (HOSPITAL_COMMUNITY): Payer: Self-pay

## 2012-07-02 ENCOUNTER — Encounter (HOSPITAL_COMMUNITY): Payer: Self-pay | Admitting: General Practice

## 2012-07-02 DIAGNOSIS — G4733 Obstructive sleep apnea (adult) (pediatric): Secondary | ICD-10-CM

## 2012-07-02 DIAGNOSIS — E669 Obesity, unspecified: Secondary | ICD-10-CM

## 2012-07-02 DIAGNOSIS — G459 Transient cerebral ischemic attack, unspecified: Secondary | ICD-10-CM | POA: Diagnosis present

## 2012-07-02 DIAGNOSIS — Z8673 Personal history of transient ischemic attack (TIA), and cerebral infarction without residual deficits: Secondary | ICD-10-CM

## 2012-07-02 DIAGNOSIS — I1A0 Resistant hypertension: Secondary | ICD-10-CM

## 2012-07-02 DIAGNOSIS — G5600 Carpal tunnel syndrome, unspecified upper limb: Secondary | ICD-10-CM | POA: Diagnosis present

## 2012-07-02 DIAGNOSIS — I10 Essential (primary) hypertension: Secondary | ICD-10-CM

## 2012-07-02 DIAGNOSIS — M6281 Muscle weakness (generalized): Secondary | ICD-10-CM

## 2012-07-02 DIAGNOSIS — E119 Type 2 diabetes mellitus without complications: Secondary | ICD-10-CM

## 2012-07-02 DIAGNOSIS — K219 Gastro-esophageal reflux disease without esophagitis: Secondary | ICD-10-CM

## 2012-07-02 LAB — HEMOGLOBIN A1C
Hgb A1c MFr Bld: 5.7 % — ABNORMAL HIGH (ref <5.7)
Mean Plasma Glucose: 117 mg/dL — ABNORMAL HIGH (ref <117)

## 2012-07-02 LAB — CBC
HCT: 35.8 % — ABNORMAL LOW (ref 36.0–46.0)
Hemoglobin: 12 g/dL (ref 12.0–15.0)
MCH: 28.4 pg (ref 26.0–34.0)
MCHC: 33.5 g/dL (ref 30.0–36.0)
MCV: 84.6 fL (ref 78.0–100.0)
Platelets: 162 10*3/uL (ref 150–400)
RBC: 4.23 MIL/uL (ref 3.87–5.11)
RDW: 14.5 % (ref 11.5–15.5)
WBC: 6.8 10*3/uL (ref 4.0–10.5)

## 2012-07-02 LAB — LIPID PANEL
Cholesterol: 147 mg/dL (ref 0–200)
HDL: 32 mg/dL — ABNORMAL LOW (ref >39)
LDL Cholesterol: 99 mg/dL (ref 0–99)
Total CHOL/HDL Ratio: 4.6 RATIO
Triglycerides: 80 mg/dL (ref <150)
VLDL: 16 mg/dL (ref 0–40)

## 2012-07-02 LAB — GLUCOSE, CAPILLARY
Glucose-Capillary: 99 mg/dL (ref 70–99)
Glucose-Capillary: 127 mg/dL — ABNORMAL HIGH (ref 70–99)

## 2012-07-02 MED ORDER — TRAMADOL HCL 50 MG PO TABS
100.0000 mg | ORAL_TABLET | Freq: Four times a day (QID) | ORAL | Status: DC | PRN
  Administered 2012-07-02: 100 mg via ORAL
  Filled 2012-07-02: qty 2

## 2012-07-02 MED ORDER — AMLODIPINE BESYLATE 5 MG PO TABS
5.0000 mg | ORAL_TABLET | Freq: Every day | ORAL | Status: DC
  Administered 2012-07-02: 5 mg via ORAL
  Filled 2012-07-02: qty 1

## 2012-07-02 MED ORDER — ASPIRIN 325 MG PO TABS
325.0000 mg | ORAL_TABLET | Freq: Every day | ORAL | Status: DC
  Administered 2012-07-02: 325 mg via ORAL
  Filled 2012-07-02: qty 1

## 2012-07-02 MED ORDER — PANTOPRAZOLE SODIUM 40 MG PO TBEC: 40.0000 mg | DELAYED_RELEASE_TABLET | Freq: Every day | ORAL | Status: DC

## 2012-07-02 MED ORDER — ENOXAPARIN SODIUM 40 MG/0.4ML ~~LOC~~ SOLN
40.0000 mg | SUBCUTANEOUS | Status: DC
  Administered 2012-07-02: 40 mg via SUBCUTANEOUS
  Filled 2012-07-02: qty 0.4

## 2012-07-02 MED ORDER — ALBUTEROL SULFATE (5 MG/ML) 0.5% IN NEBU: 2.5000 mg | INHALATION_SOLUTION | RESPIRATORY_TRACT | Status: DC | PRN

## 2012-07-02 MED ORDER — PANTOPRAZOLE SODIUM 40 MG IV SOLR
40.0000 mg | INTRAVENOUS | Status: DC
  Filled 2012-07-02 (×2): qty 40

## 2012-07-02 MED ORDER — INSULIN ASPART 100 UNIT/ML ~~LOC~~ SOLN: 0.0000 [IU] | Freq: Four times a day (QID) | SUBCUTANEOUS | Status: DC

## 2012-07-02 MED ORDER — AMLODIPINE BESYLATE 5 MG PO TABS: 5.0000 mg | ORAL_TABLET | Freq: Every day | ORAL | Status: DC

## 2012-07-02 MED ORDER — FLUTICASONE PROPIONATE HFA 44 MCG/ACT IN AERO
2.0000 | INHALATION_SPRAY | Freq: Two times a day (BID) | RESPIRATORY_TRACT | Status: DC
  Administered 2012-07-02: 2 via RESPIRATORY_TRACT
  Filled 2012-07-02: qty 10.6

## 2012-07-02 MED ORDER — IPRATROPIUM BROMIDE 0.02 % IN SOLN: 0.5000 mg | RESPIRATORY_TRACT | Status: DC | PRN

## 2012-07-02 MED ORDER — LISINOPRIL-HYDROCHLOROTHIAZIDE 20-25 MG PO TABS: 1.0000 | ORAL_TABLET | Freq: Every day | ORAL | Status: DC

## 2012-07-02 MED ORDER — PROPRANOLOL HCL 10 MG PO TABS
10.0000 mg | ORAL_TABLET | Freq: Three times a day (TID) | ORAL | Status: DC
  Administered 2012-07-02 (×2): 10 mg via ORAL
  Filled 2012-07-02 (×3): qty 1

## 2012-07-02 MED ORDER — HYDROCHLOROTHIAZIDE 25 MG PO TABS
25.0000 mg | ORAL_TABLET | Freq: Every day | ORAL | Status: DC
  Administered 2012-07-02: 25 mg via ORAL
  Filled 2012-07-02: qty 1

## 2012-07-02 MED ORDER — LISINOPRIL 20 MG PO TABS
20.0000 mg | ORAL_TABLET | Freq: Every day | ORAL | Status: DC
  Administered 2012-07-02: 20 mg via ORAL
  Filled 2012-07-02: qty 1

## 2012-07-02 MED ORDER — TRAMADOL HCL 50 MG PO TABS: 100.0000 mg | ORAL_TABLET | Freq: Four times a day (QID) | ORAL | Status: DC | PRN

## 2012-07-02 MED ORDER — INFLUENZA VIRUS VACC SPLIT PF IM SUSP: 0.5000 mL | INTRAMUSCULAR | Status: DC

## 2012-07-02 MED ORDER — KETOROLAC TROMETHAMINE 30 MG/ML IJ SOLN
30.0000 mg | Freq: Once | INTRAMUSCULAR | Status: AC
  Administered 2012-07-02: 30 mg via INTRAVENOUS
  Filled 2012-07-02: qty 1

## 2012-07-02 MED ORDER — PROPRANOLOL HCL 10 MG PO TABS: 10.0000 mg | ORAL_TABLET | Freq: Three times a day (TID) | ORAL | Status: DC

## 2012-07-02 NOTE — H&P (Signed)
PCP:   [facility]   Chief Complaint:  Right-sided weakness  HPI: This is a 31 year old female with history of CVA and mild residual right-sided weakness who states that since Friday she has been having symptoms of worsening right-sided weakness. 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 She reports that her hand writing has also deteriorated. She states she has developed worsening right lower extremity weakness. She reports blurred vision and tinnitus, headache which is frontal in location. The patient has a history of migraines but states she has not had one in a while. She reports being dizzy, she denies any photophobia but she does report some nausea or vomiting. She says her blood pressures normally well controlled, however, her blood pressures been in the 200s when standing but then improves with rest and then down to 150/80 approximately. She states she's been compliant with her medication. Patient states she feels as though her right-sided weakness is been getting progressively worse. The patient is to be on aspirin, however, she states she has not taken this in several weeks because she has severe GERD and she takes a lot of ibuprofen for chronic pain and aspirin makes his symptoms worse. The patient reports recent episodes of hypoglycemia over the past 2 weeks with blood sugars in the 40s and 60s however, they've been in the sixes over the past few days. She had cholecystectomy last year she states her appetite has been poor since but much worse recently. She now eats mainly cereal and milk, she believes this is contributing her low blood sugars. She states she has nausea and vomiting, no evidence of hematemesis but she reports projectile vomiting. History provided by the patient and her husband is at the bedside.   Review of Systems:  The patient denies anorexia, fever, weight loss,, vision  loss, decreased hearing, hoarseness, chest pain, syncope, dyspnea on exertion, peripheral edema, balance deficits, hemoptysis, abdominal pain, melena, hematochezia, hematuria, incontinence, genital sores, suspicious skin lesions, transient blindness, difficulty walking, depression, unusual weight change, abnormal bleeding, enlarged lymph nodes, angioedema, and breast masses.  Past Medical History: Past Medical History  Diagnosis Date  . Diabetes mellitus     A2DM during pregnancy. now on metformin.   [name] Hypertension   . Gonorrhea 2010    treated  . Chlamydia 2012    treated  . Pregnancy induced hypertension     pre E with pregnancies  . Asthma   . Rheumatoid arthritis   . 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 SVT (supraventricular tachycardia)    Past Surgical History  Procedure Date  . Tubal ligation 2009  . Finger surgery 1993    left ring finger to correct bone "problem"  . Cholecystectomy 07/22/2011    Procedure: LAPAROSCOPIC CHOLECYSTECTOMY WITH INTRAOPERATIVE CHOLANGIOGRAM;  Surgeon: Kandis Cocking, MD;  Location: [facility];  Service: General;  Laterality: N/A;  . Ercp 07/24/2011    Procedure: ENDOSCOPIC RETROGRADE CHOLANGIOPANCREATOGRAPHY (ERCP);  Surgeon: Theda Belfast;  Location: [facility];  Service: Endoscopy;  Laterality: N/A;    Medications: Prior to Admission medications   Medication Sig Start Date End Date Taking? Authorizing Provider  albuterol (PROVENTIL HFA;VENTOLIN HFA) 108 (90 BASE) MCG/ACT inhaler Inhale 2 puffs into the lungs every 6 (six) hours as needed. For wheezing   Yes Historical Provider, MD  albuterol (PROVENTIL) (2.5 MG/3ML) 0.083% nebulizer solution Take 2.5 mg  by nebulization every 6 (six) hours as needed. For wheezing   Yes Historical Provider, MD  amLODipine (NORVASC) 5 MG tablet Take 5 mg by mouth daily.   Yes Historical Provider, MD  beclomethasone (QVAR) 80 MCG/ACT inhaler Inhale 2 puffs into the lungs 2  (two) times daily.   Yes Historical Provider, MD  Famotidine (PEPCID AC PO) Take 1 tablet by mouth daily.   Yes Historical Provider, MD  ibuprofen (ADVIL,MOTRIN) 800 MG tablet Take 800 mg by mouth every 8 (eight) hours as needed. For pain   Yes Historical Provider, MD  lisinopril-hydrochlorothiazide (PRINZIDE,ZESTORETIC) 20-25 MG per tablet Take 1 tablet by mouth daily.   Yes Historical Provider, MD  Multiple Vitamins-Minerals (MULTIVITAMIN WITH MINERALS) tablet Take 1 tablet by mouth daily.     Yes Historical Provider, MD  propranolol (INDERAL) 10 MG tablet Take 10 mg by mouth 3 (three) times daily. 07/25/11  Yes Letha Cape, PA  ranitidine (ZANTAC) 150 MG tablet Take 150 mg by mouth daily as needed. For heartburn when pepcid does not work   Yes Ecologist, MD    Allergies:   Allergies  Allergen Reactions  . Amoxicillin Anaphylaxis and Rash    Facial swelling  . Dilaudid (Hydromorphone Hcl) Itching    Social History:  reports that she has been smoking Cigarettes.  She has a 1 pack-year smoking history. She does not have any smokeless tobacco history on file. She reports that she drinks alcohol. She reports that she does not use illicit drugs. should be using a cane, lives at home with her husband and 6 children   Family History: Family History  Problem Relation Age of Onset  . Anesthesia problems Mother     difficult to arouse, SOB  . Diabetes Mother   . Hypertension Mother   . Stroke Mother   . Diabetes Father   . Hypertension Father   . Heart disease Father   . Cancer Maternal Aunt   . Cancer Maternal Grandmother     Physical Exam: Filed Vitals:   07/01/12 2245 07/01/12 2300 07/01/12 2315 07/01/12 2330  BP: 130/75 116/52 121/53 133/66  Pulse: 82 74 74 80  Temp:      TempSrc:      Resp:      SpO2: 100% 98% 100% 100%    General:  Alert and oriented times three,morbidly obese , no acute distress Eyes: PERRLA, pink conjunctiva, no scleral icterus ENT:  Moist oral mucosa, neck supple, no thyromegaly Lungs: clear to ascultation, no wheeze, no crackles, no use of accessory muscles Cardiovascular: regular rate and rhythm, no regurgitation, no gallops, no murmurs. No carotid bruits, no JVD Abdomen: soft, positive BS, non-tender, non-distended, no organomegaly, not an acute abdomen GU: not examined Neuro: CN II - XII grossly intact, sensation intact Musculoskeletal: strength 5/5 left upper and lower extremities, 4/5 right upper and lower extremities, no clubbing, cyanosis or edema Skin: no rash, no subcutaneous crepitation, no decubitus Psych: appropriate patient   Labs on Admission:   [facility] 07/01/12 1830  NA 138  K 3.6  CL 101  CO2 27  GLUCOSE 85  BUN 15  CREATININE 0.75  CALCIUM 9.6  MG --  PHOS --   No results found for this basename: AST:2,ALT:2,ALKPHOS:2,BILITOT:2,PROT:2,ALBUMIN:2 in the last 72 hours No results found for this basename: LIPASE:2,AMYLASE:2 in the last 72 hours  Basename 07/01/12 1830  WBC 7.4  NEUTROABS 3.9  HGB 13.0  HCT 37.7  MCV 84.3  PLT 211  No results found for this basename: CKTOTAL:3,CKMB:3,CKMBINDEX:3,TROPONINI:3 in the last 72 hours No components found with this basename: POCBNP:3 No results found for this basename: DDIMER:2 in the last 72 hours No results found for this basename: HGBA1C:2 in the last 72 hours No results found for this basename: CHOL:2,HDL:2,LDLCALC:2,TRIG:2,CHOLHDL:2,LDLDIRECT:2 in the last 72 hours No results found for this basename: TSH,T4TOTAL,FREET3,T3FREE,THYROIDAB in the last 72 hours No results found for this basename: VITAMINB12:2,FOLATE:2,FERRITIN:2,TIBC:2,IRON:2,RETICCTPCT:2 in the last 72 hours  Micro Results: No results found for this or any previous visit (from the past 240 hour(s)).   Radiological Exams on Admission: Ct Head Wo Contrast  07/01/2012  *RADIOLOGY REPORT*  Clinical Data: Hypertension, memory loss, right-sided weakness  CT HEAD WITHOUT  CONTRAST  Technique:  Contiguous axial images were obtained from the base of the skull through the vertex without contrast.  Comparison: 04/24/2011  Findings: No evidence of parenchymal hemorrhage or extra-axial fluid collection. No mass lesion, mass effect, or midline shift.  No CT evidence of acute infarction.  Cerebral volume is age appropriate.  No ventriculomegaly.  The visualized paranasal sinuses are essentially clear. The mastoid air cells are unopacified.  No evidence of calvarial fracture.  IMPRESSION: No evidence of acute intracranial abnormality.   Original Report Authenticated By: Charline Bills, M.D.     EKG: Normal sinus rhythm   Assessment/Plan Present on Admission:  . TIA (transient ischemic attack) Admit for 23 hour observation TIA used, neurology on board  Aspirin restarted Diabetes mellitus ADA diet and sliding scale insulin once meal started  extreme obesity Hypertension Rheumatoid arthritis GERD Resume home medications Protonix is substituted for Pepcid possible carpal tunnel syndrome May be contributing to right upper extremity weakness  Full code DVT prophylaxis   Kessler Kopinski 07/02/2012, 12:40 AM

## 2012-07-02 NOTE — Progress Notes (Signed)
Physician Discharge Summary  Maria Carpenter VFI:[id number] DOB: [birth year] DOA: 07/01/2012  PCP: Sheila Oats, MD  Admit date: 07/01/2012 Discharge date: 07/02/2012  Time spent: 35 min minutes  Recommendations for Outpatient Follow-up:  1. Follow up PCP in 2 weeks,   Discharge Diagnoses:  Active Problems:  TIA (transient ischemic attack)  Diabetes mellitus  Hypertension  Rheumatoid arthritis  H/O: CVA (cerebrovascular accident)  Obesity  GERD (gastroesophageal reflux disease)  Obstructive sleep apnea  CTS (carpal tunnel syndrome)   Discharge Condition: Stable  Diet recommendation: low fat diet  Filed Weights   07/02/12 0138  Weight: 153.225 kg (337 lb 12.8 oz)    History of present illness:  This is a 31 year old female with history of CVA and mild residual right-sided weakness who states that since Friday she has been having symptoms of worsening right-sided weakness. 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 She reports that her hand writing has also deteriorated. She states she has developed worsening right lower extremity weakness. She reports blurred vision and tinnitus, headache which is frontal in location. The patient has a history of migraines but states she has not had one in a while. She reports being dizzy, she denies any photophobia but she does report some nausea or vomiting. She says her blood pressures normally well controlled, however, her blood pressures been in the 200s when standing but then improves with rest and then down to 150/80 approximately. She states she's been compliant with her medication. Patient states she feels as though her right-sided weakness is been getting progressively worse. The patient is to be on aspirin, however, she states she has not taken this in several weeks because she has severe GERD and she takes a lot of ibuprofen for chronic pain and  aspirin makes his symptoms worse   Hospital Course:  ? TIA Patient was seen by neurology, neurology symptoms were felt to be migraine related, not stroke TIA related.All the work up has been discontinued by neurology. Patient was started on Ultram for the migraines and will be discharged on Ultram. She can follow up Dr Pearlean Brownie in 4 weeks for migraines  Diabetes mellitus Patient's Blood glucose was stable in the hospital. Her Diabetes is diet controlled.  Migraines Will give her Ultram prn.  Breast lumps Patient says she has breast lumps for many months, asymptomatic, She will need to follow up PCP as outpatient for further work up  Hypertension Will resume the home medications.    Consultations:Neurology  Discharge Exam: Filed Vitals:   07/02/12 0100 07/02/12 0138 07/02/12 0558 07/02/12 1000  BP: 118/51 121/74 110/55 121/57  Pulse: 82 88 89 70  Temp:  98.2 F (36.8 C) 98.2 F (36.8 C) 97.3 F (36.3 C)  TempSrc:  Oral Oral Oral  Resp: 16 17 18 17   Height:  5' 6.5" (1.689 m)    Weight:  153.225 kg (337 lb 12.8 oz)    SpO2: 100% 100% 99% 100%    General: In no acute disterss Cardiovascular: S1S2 RRR Respiratory: Soft, nontender Ext : no edema  Discharge Instructions  Discharge Orders    Future Orders Please Complete By Expires   Diet - low sodium heart healthy      Increase activity slowly      Discharge instructions      Comments:   Follow up PCP for breast lumps.       Medication List  As of 07/02/2012  1:54 PM    TAKE these medications         albuterol (2.5 MG/3ML) 0.083% nebulizer solution   Commonly known as: PROVENTIL   Take 2.5 mg by nebulization every 6 (six) hours as needed. For wheezing      albuterol 108 (90 BASE) MCG/ACT inhaler   Commonly known as: PROVENTIL HFA;VENTOLIN HFA   Inhale 2 puffs into the lungs every 6 (six) hours as needed. For wheezing      amLODipine 5 MG tablet   Commonly known as: NORVASC   Take 5 mg by mouth  daily.      beclomethasone 80 MCG/ACT inhaler   Commonly known as: QVAR   Inhale 2 puffs into the lungs 2 (two) times daily.      ibuprofen 800 MG tablet   Commonly known as: ADVIL,MOTRIN   Take 800 mg by mouth every 8 (eight) hours as needed. For pain      lisinopril-hydrochlorothiazide 20-25 MG per tablet   Commonly known as: PRINZIDE,ZESTORETIC   Take 1 tablet by mouth daily.      multivitamin with minerals tablet   Take 1 tablet by mouth daily.      PEPCID AC PO   Take 1 tablet by mouth daily.      propranolol 10 MG tablet   Commonly known as: INDERAL   Take 10 mg by mouth 3 (three) times daily.      ranitidine 150 MG tablet   Commonly known as: ZANTAC   Take 150 mg by mouth daily as needed. For heartburn when pepcid does not work      traMADol 50 MG tablet   Commonly known as: ULTRAM   Take 2 tablets (100 mg total) by mouth every 6 (six) hours as needed for pain.          The results of significant diagnostics from this hospitalization (including imaging, microbiology, ancillary and laboratory) are listed below for reference.    Significant Diagnostic Studies: Ct Head Wo Contrast  07/01/2012  *RADIOLOGY REPORT*  Clinical Data: Hypertension, memory loss, right-sided weakness  CT HEAD WITHOUT CONTRAST  Technique:  Contiguous axial images were obtained from the base of the skull through the vertex without contrast.  Comparison: 04/24/2011  Findings: No evidence of parenchymal hemorrhage or extra-axial fluid collection. No mass lesion, mass effect, or midline shift.  No CT evidence of acute infarction.  Cerebral volume is age appropriate.  No ventriculomegaly.  The visualized paranasal sinuses are essentially clear. The mastoid air cells are unopacified.  No evidence of calvarial fracture.  IMPRESSION: No evidence of acute intracranial abnormality.   Original Report Authenticated By: Charline Bills, M.D.    Mri Brain Without Contrast  07/02/2012  *RADIOLOGY REPORT*   Clinical Data:  Right-sided weakness. Diabetes and hypertension. Obesity.  MRI HEAD WITHOUT CONTRAST MRA HEAD WITHOUT CONTRAST  Technique:  Multiplanar, multiecho pulse sequences of the brain and surrounding structures were obtained without intravenous contrast. Angiographic images of the head were obtained using MRA technique without contrast.  Comparison:  CT head most recent 07/01/2012  MRI HEAD  Findings:  There is no evidence for acute infarction, intracranial hemorrhage, mass lesion, hydrocephalus, or extra-axial fluid. There is no atrophy or significant white matter disease.  Prominent perivascular spaces surround the right greater than left anterior commissure.  No foci of chronic hemorrhage.  Symmetric temporal lobes.  Widely patent intracranial vasculature.  Negative orbits, sinuses, and mastoids.  There is slightly heterogeneous signal  in the clivus without areas of focal osseous destruction.  Marrow heterogeneity can be associate with wide variety of nonspecific abnormalities including anemia, obesity, or chronic disease.  The calvarium is grossly intact.  Upper cervical region unremarkable.  No pannus surrounding the odontoid in this patient with history rheumatoid arthritis.  Mild tonsillar ectopia without frank Chiari I malformation.  Partially empty sella.  IMPRESSION: No acute intracranial findings.  No evidence for intracranial stroke or bleed.  Partial empty sella.  Slight heterogeneity of the clivus; see discussion above  MRA HEAD  Findings: The internal carotid arteries are widely patent.  The basilar artery is widely patent.  The vertebrals are codominant.  There is no proximal stenosis of the anterior, middle, or posterior cerebral arteries.  There is no cerebellar branch occlusion.  There is no visible intracranial aneurysm or distal vessel narrowing to suggest vasculitis.  IMPRESSION: Negative MRA intracranial circulation.   Original Report Authenticated By: Davonna Belling, M.D.    US  Transvaginal Non-ob  06/28/2012  *RADIOLOGY REPORT*  Clinical Data: Right lower quadrant pain and abdominal pain for 3 weeks.  TRANSABDOMINAL AND TRANSVAGINAL ULTRASOUND OF PELVIS Technique:  Both transabdominal and transvaginal ultrasound examinations of the pelvis were performed. Transabdominal technique was performed for global imaging of the pelvis including uterus, ovaries, adnexal regions, and pelvic cul-de-sac.  It was necessary to proceed with endovaginal exam following the transabdominal exam to visualize the endometrium and ovaries.  Comparison:  CT abdomen and pelvis 01/24/2012.  Ultrasound pelvis 01/24/2012.  Findings:  Uterus: The uterus is anteverted and measures 11 x 5.8 x 6.4 cm. No focal myometrial masses. Cysts in the cervical region consistent with Nabothian cysts.  Endometrium: Endometrial stripe thickness is measured at 1.4 cm. No endometrial fluid collections.  Right ovary:  The right ovary measures 3.6 x 2.3 x 2.1 cm.  There is a simple cyst measuring 1.9 cm diameter.  This is likely to be a functional cyst.  No abnormal adnexal masses.  Flow is demonstrated in the right ovary on color flow Doppler imaging.  Left ovary: Left ovary measures 1.9 x 1.3 x 1.6 cm.  Normal follicular changes.  No abnormal adnexal masses.  Other findings: No free fluid  IMPRESSION: Normal study. No evidence of pelvic mass or other significant abnormality. No significant changes since previous study.   Original Report Authenticated By: Burman Nieves, M.D.    US Pelvis Complete  06/28/2012  *RADIOLOGY REPORT*  Clinical Data: Right lower quadrant pain and abdominal pain for 3 weeks.  TRANSABDOMINAL AND TRANSVAGINAL ULTRASOUND OF PELVIS Technique:  Both transabdominal and transvaginal ultrasound examinations of the pelvis were performed. Transabdominal technique was performed for global imaging of the pelvis including uterus, ovaries, adnexal regions, and pelvic cul-de-sac.  It was necessary to proceed with  endovaginal exam following the transabdominal exam to visualize the endometrium and ovaries.  Comparison:  CT abdomen and pelvis 01/24/2012.  Ultrasound pelvis 01/24/2012.  Findings:  Uterus: The uterus is anteverted and measures 11 x 5.8 x 6.4 cm. No focal myometrial masses. Cysts in the cervical region consistent with Nabothian cysts.  Endometrium: Endometrial stripe thickness is measured at 1.4 cm. No endometrial fluid collections.  Right ovary:  The right ovary measures 3.6 x 2.3 x 2.1 cm.  There is a simple cyst measuring 1.9 cm diameter.  This is likely to be a functional cyst.  No abnormal adnexal masses.  Flow is demonstrated in the right ovary on color flow Doppler imaging.  Left ovary: Left  ovary measures 1.9 x 1.3 x 1.6 cm.  Normal follicular changes.  No abnormal adnexal masses.  Other findings: No free fluid  IMPRESSION: Normal study. No evidence of pelvic mass or other significant abnormality. No significant changes since previous study.   Original Report Authenticated By: Burman Nieves, M.D.    Mr Mra Head/brain Wo Cm  07/02/2012  *RADIOLOGY REPORT*  Clinical Data:  Right-sided weakness. Diabetes and hypertension. Obesity.  MRI HEAD WITHOUT CONTRAST MRA HEAD WITHOUT CONTRAST  Technique:  Multiplanar, multiecho pulse sequences of the brain and surrounding structures were obtained without intravenous contrast. Angiographic images of the head were obtained using MRA technique without contrast.  Comparison:  CT head most recent 07/01/2012  MRI HEAD  Findings:  There is no evidence for acute infarction, intracranial hemorrhage, mass lesion, hydrocephalus, or extra-axial fluid. There is no atrophy or significant white matter disease.  Prominent perivascular spaces surround the right greater than left anterior commissure.  No foci of chronic hemorrhage.  Symmetric temporal lobes.  Widely patent intracranial vasculature.  Negative orbits, sinuses, and mastoids.  There is slightly heterogeneous signal in  the clivus without areas of focal osseous destruction.  Marrow heterogeneity can be associate with wide variety of nonspecific abnormalities including anemia, obesity, or chronic disease.  The calvarium is grossly intact.  Upper cervical region unremarkable.  No pannus surrounding the odontoid in this patient with history rheumatoid arthritis.  Mild tonsillar ectopia without frank Chiari I malformation.  Partially empty sella.  IMPRESSION: No acute intracranial findings.  No evidence for intracranial stroke or bleed.  Partial empty sella.  Slight heterogeneity of the clivus; see discussion above  MRA HEAD  Findings: The internal carotid arteries are widely patent.  The basilar artery is widely patent.  The vertebrals are codominant.  There is no proximal stenosis of the anterior, middle, or posterior cerebral arteries.  There is no cerebellar branch occlusion.  There is no visible intracranial aneurysm or distal vessel narrowing to suggest vasculitis.  IMPRESSION: Negative MRA intracranial circulation.   Original Report Authenticated By: Davonna Belling, M.D.     Microbiology: No results found for this or any previous visit (from the past 240 hour(s)).   Labs: Basic Metabolic Panel:  Lab 07/01/12 1610  NA 138  K 3.6  CL 101  CO2 27  GLUCOSE 85  BUN 15  CREATININE 0.75  CALCIUM 9.6  MG --  PHOS --   Liver Function Tests: No results found for this basename: AST:5,ALT:5,ALKPHOS:5,BILITOT:5,PROT:5,ALBUMIN:5 in the last 168 hours No results found for this basename: LIPASE:5,AMYLASE:5 in the last 168 hours No results found for this basename: AMMONIA:5 in the last 168 hours CBC:  Lab 07/02/12 0624 07/01/12 1830  WBC 6.8 7.4  NEUTROABS -- 3.9  HGB 12.0 13.0  HCT 35.8* 37.7  MCV 84.6 84.3  PLT 162 211   Cardiac Enzymes: No results found for this basename: CKTOTAL:5,CKMB:5,CKMBINDEX:5,TROPONINI:5 in the last 168 hours BNP: BNP (last 3 results) No results found for this basename: PROBNP:3 in  the last 8760 hours CBG:  Lab 07/02/12 1147 07/02/12 0638 07/01/12 2003 07/01/12 1826  GLUCAP 127* 99 76 89       Signed:  Desira Alessandrini S  [facility] 07/02/2012, 1:54 PM

## 2012-07-02 NOTE — Consult Note (Signed)
Referring Physician: Dr. Rulon Abide    Chief Complaint: Right-sided weakness for 4-5 days.  HPI: CYANNE DELMAR is an 31 y.o. female Mr. diabetes mellitus, hypertension, rheumatoid arthritis and stroke in March 2012, presenting with complaint of weakness involving right upper extremity primarily since 06/26/2012. Patient has noticed reduced hand grip as well as tingling involving right hand. There is no clear indication and history of right lower extremity symptoms. No facial weakness has been noted. She's had no changes in speech and swallowing. She reportedly had a stroke in March 2012 and was hospitalized at this hospital. No records of this hospitalization are available. Patient has not been taking aspirin daily as was apparently recommended because of problems with upper GI discomfort, particularly when she takes ibuprofen for rheumatoid arthritis. CT scan of her head showed no acute abnormality. No evidence of a previous stroke is seen, as well. NIH stroke score 0.  LSN: 5 days ago tPA Given: No: No objective deficits; beyond time. For treatment consideration. MRankin: 0  Past Medical History  Diagnosis Date  . Diabetes mellitus     A2DM during pregnancy. now on metformin.   [name] Hypertension   . Gonorrhea 2010    treated  . Chlamydia 2012    treated  . Pregnancy induced hypertension     pre E with pregnancies  . Asthma   . Rheumatoid arthritis   . 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 SVT (supraventricular tachycardia)     Family History  Problem Relation Age of Onset  . Anesthesia problems Mother     difficult to arouse, SOB  . Diabetes Mother   . Hypertension Mother   . Stroke Mother   . Diabetes Father   . Hypertension Father   . Heart disease Father   . Cancer Maternal Aunt   . Cancer Maternal Grandmother      Medications:  Prior to Admission:  Albuterol inhaler 2 puffs every 6 hours when necessary Norvasc 5 mg per  day Beclomethasone 2 puffs twice a day Pepcid AC one tablet daily Ibuprofen 800 mg 3 times a day when necessary Zestoretic 20-25 one tablet daily  propranolol 10 mg 3 times a day Zantac 150 mg daily when necessary  Physical Examination: Blood pressure 133/66, pulse 80, temperature 98.8 F (37.1 C), temperature source Oral, resp. rate 18, last menstrual period 04/04/2012, SpO2 100.00%.  Neurologic Examination: Mental Status: Alert, oriented, thought content appropriate.  Speech fluent without evidence of aphasia. Able to follow commands without difficulty. Cranial Nerves: II-Visual fields were normal. III/IV/VI-Pupils were equal and reacted. Extraocular movements were full and conjugate.    V/VII-no facial numbness and no facial weakness. VIII-normal. X-normal speech and symmetrical palatal movement. XII-midline tongue extension Motor: 5/5 bilaterally with normal tone and bulk, except for slightly reduced hand grip on the right versus less than full voluntary effort with strength testing. Sensory: Normal tactile sensation throughout; positive Phalen's and median Tinel's sign at the right wrist.. Deep Tendon Reflexes: 2+ and symmetric. Plantars: Flexor bilaterally Cerebellar: Normal finger-to-nose testing. Carotid auscultation: Normal   Ct Head Wo Contrast  07/01/2012  *RADIOLOGY REPORT*  Clinical Data: Hypertension, memory loss, right-sided weakness  CT HEAD WITHOUT CONTRAST  Technique:  Contiguous axial images were obtained from the base of the skull through the vertex without contrast.  Comparison: 04/24/2011  Findings: No evidence of parenchymal hemorrhage or extra-axial fluid collection. No mass lesion, mass effect, or midline shift.  No CT evidence  of acute infarction.  Cerebral volume is age appropriate.  No ventriculomegaly.  The visualized paranasal sinuses are essentially clear. The mastoid air cells are unopacified.  No evidence of calvarial fracture.  IMPRESSION: No evidence  of acute intracranial abnormality.   Original Report Authenticated By: Charline Bills, M.D.     Assessment:  1. Possible left subcortical TIA, of doubtful, as etiology for symptoms involving right extremities. Patient has no clear objective indications of right hemiparesis. Given her risk factors for stroke, however, TIA or possible small subcortical stroke need to be ruled out. 2. Right carpal tunnel syndrome.  Stroke Risk Factors - diabetes mellitus and hypertension  Plan: 1. HgbA1c, fasting lipid panel 2. MRI, MRA  of the brain without contrast 3. PT consult, OT consult, Speech consult 4. Echocardiogram 5. Carotid dopplers 6. Prophylactic therapy-Antiplatelet med: Aspirin 81 mg per day 7. Risk factor modification 8. Telemetry monitoring 9. Cockup wrist splint for right carpal tunnel syndrome, to be normal at bedtime and on a when necessary basis during the day. Neurology followup for nerve conduction studies on outpatient basis following discharge is also recommended.  C.R. Roseanne Reno, MD [facility] [phone]  07/02/2012, 12:33 AM

## 2012-07-02 NOTE — Progress Notes (Signed)
Stroke Team Progress Note  HISTORY Maria Carpenter is an 31 y.o. female hx diabetes mellitus, hypertension, rheumatoid arthritis and stroke in March 2012, presenting with complaint of weakness involving right upper extremity primarily since 06/26/2012. Patient has noticed reduced hand grip as well as tingling involving right hand. There is no clear indication and history of right lower extremity symptoms. No facial weakness has been noted. She's had no changes in speech and swallowing. She reportedly had a stroke in March 2012 and was hospitalized at this hospital. No records of this hospitalization are available. Patient has not been taking aspirin daily as was apparently recommended because of problems with upper GI discomfort, particularly when she takes ibuprofen for rheumatoid arthritis. CT scan of her head showed no acute abnormality. No evidence of a previous stroke is seen, as well. NIH stroke score 0.  Patient was not a TPA candidate secondary to delay in arrival. She was admitted for further evaluation and treatment.  SUBJECTIVE Her son and husband are at the bedside.  Overall she feels her condition is gradually improving. No relief of headache from toradol last night.  OBJECTIVE Most recent Vital Signs: Filed Vitals:   07/02/12 0100 07/02/12 0138 07/02/12 0558 07/02/12 1000  BP: 118/51 121/74 110/55 121/57  Pulse: 82 88 89 70  Temp:  98.2 F (36.8 C) 98.2 F (36.8 C) 97.3 F (36.3 C)  TempSrc:  Oral Oral Oral  Resp: 16 17 18 17   Height:  5' 6.5" (1.689 m)    Weight:  153.225 kg (337 lb 12.8 oz)    SpO2: 100% 100% 99% 100%   CBG (last 3)   Basename 07/02/12 0638 07/01/12 2003 07/01/12 1826  GLUCAP 99 76 89    IV Fluid Intake:     MEDICATIONS    . amLODipine  5 mg Oral Daily  . aspirin  325 mg Oral Daily  . enoxaparin  40 mg Subcutaneous Q24H  . fluticasone  2 puff Inhalation BID  . lisinopril  20 mg Oral Daily   And  . hydrochlorothiazide  25 mg Oral Daily  .  influenza  inactive virus vaccine  0.5 mL Intramuscular Tomorrow-1000  . insulin aspart  0-15 Units Subcutaneous Q6H  . [COMPLETED] ketorolac  30 mg Intravenous Once  . pantoprazole (PROTONIX) IV  40 mg Intravenous Q24H  . propranolol  10 mg Oral TID  . [DISCONTINUED] lisinopril-hydrochlorothiazide  1 tablet Oral Daily  . [DISCONTINUED] metoprolol  5 mg Intravenous Once   PRN:  albuterol, ipratropium  Diet:  Carb Control thin liquids Activity:   Bathroom privileges with assistance DVT Prophylaxis:  Lovenox 40 mg sq daily   CLINICALLY SIGNIFICANT STUDIES Basic Metabolic Panel:  Lab 07/01/12 1610  NA 138  K 3.6  CL 101  CO2 27  GLUCOSE 85  BUN 15  CREATININE 0.75  CALCIUM 9.6  MG --  PHOS --   Liver Function Tests: No results found for this basename: AST:2,ALT:2,ALKPHOS:2,BILITOT:2,PROT:2,ALBUMIN:2 in the last 168 hours CBC:  Lab 07/02/12 0624 07/01/12 1830  WBC 6.8 7.4  NEUTROABS -- 3.9  HGB 12.0 13.0  HCT 35.8* 37.7  MCV 84.6 84.3  PLT 162 211   Coagulation: No results found for this basename: LABPROT:4,INR:4 in the last 168 hours Cardiac Enzymes: No results found for this basename: CKTOTAL:3,CKMB:3,CKMBINDEX:3,TROPONINI:3 in the last 168 hours Urinalysis:  Lab 06/28/12 2055  COLORURINE BROWN*  LABSPEC >1.030*  PHURINE 6.0  GLUCOSEU NEGATIVE  HGBUR NEGATIVE  BILIRUBINUR NEGATIVE  KETONESUR 15*  PROTEINUR NEGATIVE  UROBILINOGEN 0.2  NITRITE NEGATIVE  LEUKOCYTESUR NEGATIVE   Lipid Panel    Component Value Date/Time   CHOL 147 07/02/2012 0624   TRIG 80 07/02/2012 0624   HDL 32* 07/02/2012 0624   CHOLHDL 4.6 07/02/2012 0624   VLDL 16 07/02/2012 0624   LDLCALC 99 07/02/2012 0624   HgbA1C  Lab Results  Component Value Date   HGBA1C 5.9* 07/23/2011    Urine Drug Screen:     Component Value Date/Time   LABOPIA NONE DETECTED 10/30/2010 2310   COCAINSCRNUR NONE DETECTED 10/30/2010 2310   LABBENZ NONE DETECTED 10/30/2010 2310   AMPHETMU NONE DETECTED 10/30/2010 2310     THCU POSITIVE* 10/30/2010 2310   LABBARB  Value: NONE DETECTED        DRUG SCREEN FOR MEDICAL PURPOSES ONLY.  IF CONFIRMATION IS NEEDED FOR ANY PURPOSE, NOTIFY LAB WITHIN 5 DAYS.        LOWEST DETECTABLE LIMITS FOR URINE DRUG SCREEN Drug Class       Cutoff (ng/mL) Amphetamine      1000 Barbiturate      200 Benzodiazepine   200 Tricyclics       300 Opiates          300 Cocaine          300 THC              50 10/30/2010 2310    Alcohol Level: No results found for this basename: ETH:2 in the last 168 hours  CT of the brain  07/01/2012   No evidence of acute intracranial abnormality.    MRI of the brain  07/02/2012  No acute intracranial findings.  No evidence for intracranial stroke or bleed.  Partial empty sella.  Slight heterogeneity of the clivus  MRA of the brain  07/02/2012   Negative MRA intracranial circulation.   EKG  normal EKG, normal sinus rhythm.   Therapy Recommendations no therapy needs  Physical Exam   Obese young african american lady not in distress.Awake alert. Afebrile. Head is nontraumatic. Neck is supple without bruit. Hearing is normal. Cardiac exam no murmur or gallop. Lungs are clear to auscultation. Distal pulses are well felt.  Neurological Exam ; Awake  Alert oriented x 3. Normal speech and language.eye movements full without nystagmus.Fundi not visualized. Vision acuity and fields appear normal. Face symmetric. Tongue midline. Normal strength, tone, reflexes and coordination. Normal sensation. Gait deferred.  ASSESSMENT [name] is a 31 y.o. female presenting with right UE hemiparesis. Imaging confirms no acute infarct. Neuro symptoms felt to be migraine related, not stroke/TIA.  Patient with no resultant neuro symptoms.   Migraines since teenager  Obesity class 3, Body mass index is 53.71 kg/(m^2).  Diabetes Hypertension RA Stroke, March 2012 w/ right side deficit  Hospital day # 1  TREATMENT/PLAN  No need for antiplatelets  No further stroke  workup indicated. Will canced 2D and carotids.   Add prn ultram for headaches Stroke Service will sign off. Follow up with neuro for continued headaches if needed  Annie Main, MSN, RN, ANVP-BC, ANP-BC, GNP-BC [facility] Pager: [phone] 07/02/2012 10:48 AM  Scribe for Dr. Delia Heady, Stroke Center Medical Director, who has personally reviewed chart, pertinent data, examined the patient and developed the plan of care. Pager:  [phone]

## 2012-07-03 LAB — GLUCOSE, CAPILLARY: Glucose-Capillary: 92 mg/dL (ref 70–99)

## 2012-07-03 NOTE — Care Management Note (Signed)
    Page 1 of 1   07/03/2012     12:42:59 PM   CARE MANAGEMENT NOTE 07/03/2012  Patient:  Maria Carpenter, Maria Carpenter   Account Number:  [id number]  Date Initiated:  07/02/2012  Documentation initiated by:  [facility]  Subjective/Objective Assessment:   Admitted with rt sided weakness, TIA. Lives with husband and 6 children.     Action/Plan:   Anticipated DC Date:  07/02/2012   Anticipated DC Plan:  HOME/SELF CARE      DC Planning Services  CM consult      Choice offered to / List presented to:             Status of service:  Completed, signed off Medicare Important Message given?   (If response is "NO", the following Medicare IM given date fields will be blank) Date Medicare IM given:   Date Additional Medicare IM given:    Discharge Disposition:  HOME/SELF CARE  Per UR Regulation:  Reviewed for med. necessity/level of care/duration of stay  If discussed at Long Length of Stay Meetings, dates discussed:    Comments:

## 2012-07-16 NOTE — Discharge Summary (Signed)
PCP: Sheila Oats, MD  Admit date: 07/01/2012  Discharge date: 07/02/2012  Time spent: 35 min minutes  Recommendations for Outpatient Follow-up:  1. Follow up PCP in 2 weeks,  Discharge Diagnoses:  Active Problems:  TIA (transient ischemic attack)  Diabetes mellitus  Hypertension  Rheumatoid arthritis  H/O: CVA (cerebrovascular accident)  Obesity  GERD (gastroesophageal reflux disease)  Obstructive sleep apnea  CTS (carpal tunnel syndrome)   Discharge Condition: Stable  Diet recommendation: low fat diet  Filed Weights    07/02/12 0138   Weight:  153.225 kg (337 lb 12.8 oz)    History of present illness:  This is a 31 year old female with history of CVA and mild residual right-sided weakness who states that since Friday she has been having symptoms of worsening right-sided weakness. 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 She reports that her hand writing has also deteriorated. She states she has developed worsening right lower extremity weakness. She reports blurred vision and tinnitus, headache which is frontal in location. The patient has a history of migraines but states she has not had one in a while. She reports being dizzy, she denies any photophobia but she does report some nausea or vomiting. She says her blood pressures normally well controlled, however, her blood pressures been in the 200s when standing but then improves with rest and then down to 150/80 approximately. She states she's been compliant with her medication. Patient states she feels as though her right-sided weakness is been getting progressively worse. The patient is to be on aspirin, however, she states she has not taken this in several weeks because she has severe GERD and she takes a lot of ibuprofen for chronic pain and aspirin makes his symptoms worse  Hospital Course:  ? TIA  Patient was seen by neurology, neurology  symptoms were felt to be migraine related, not stroke TIA related.All the work up has been discontinued by neurology.  Patient was started on Ultram for the migraines and will be discharged on Ultram.  She can follow up Dr Pearlean Brownie in 4 weeks for migraines  Diabetes mellitus Patient's Blood glucose was stable in the hospital.  Her Diabetes is diet controlled.  Migraines  Will give her Ultram prn.  Breast lumps  Patient says she has breast lumps for many months, asymptomatic, She will need to follow up PCP as outpatient for further work up  Hypertension  Will resume the home medications.  Consultations:Neurology  Discharge Exam:  Filed Vitals:    07/02/12 0100  07/02/12 0138  07/02/12 0558  07/02/12 1000   BP:  118/51  121/74  110/55  121/57   Pulse:  82  88  89  70   Temp:   98.2 F (36.8 C)  98.2 F (36.8 C)  97.3 F (36.3 C)   TempSrc:   Oral  Oral  Oral   Resp:  16  17  18  17    Height:   5' 6.5" (1.689 m)     Weight:   153.225 kg (337 lb 12.8 oz)     SpO2:  100%  100%  99%  100%    General: In no acute disterss  Cardiovascular: S1S2 RRR  Respiratory: Soft, nontender  Ext : no edema  Discharge Instructions  Discharge Orders    Future Orders  Please Complete By  Expires    Diet - low sodium heart healthy  Increase activity slowly      Discharge instructions      Comments:    Follow up PCP for breast lumps.        Medication List      As of 07/02/2012 1:54 PM     TAKE these medications        albuterol (2.5 MG/3ML) 0.083% nebulizer solution     Commonly known as: PROVENTIL     Take 2.5 mg by nebulization every 6 (six) hours as needed. For wheezing     albuterol 108 (90 BASE) MCG/ACT inhaler     Commonly known as: PROVENTIL HFA;VENTOLIN HFA     Inhale 2 puffs into the lungs every 6 (six) hours as needed. For wheezing     amLODipine 5 MG tablet     Commonly known as: NORVASC     Take 5 mg by mouth daily.     beclomethasone 80 MCG/ACT inhaler     Commonly  known as: QVAR     Inhale 2 puffs into the lungs 2 (two) times daily.     ibuprofen 800 MG tablet     Commonly known as: ADVIL,MOTRIN     Take 800 mg by mouth every 8 (eight) hours as needed. For pain     lisinopril-hydrochlorothiazide 20-25 MG per tablet     Commonly known as: PRINZIDE,ZESTORETIC     Take 1 tablet by mouth daily.     multivitamin with minerals tablet     Take 1 tablet by mouth daily.     PEPCID AC PO     Take 1 tablet by mouth daily.     propranolol 10 MG tablet     Commonly known as: INDERAL     Take 10 mg by mouth 3 (three) times daily.     ranitidine 150 MG tablet     Commonly known as: ZANTAC     Take 150 mg by mouth daily as needed. For heartburn when pepcid does not work     traMADol 50 MG tablet     Commonly known as: ULTRAM     Take 2 tablets (100 mg total) by mouth every 6 (six) hours as needed for pain.       The results of significant diagnostics from this hospitalization (including imaging, microbiology, ancillary and laboratory) are listed below for reference.   Significant Diagnostic Studies:  Ct Head Wo Contrast  07/01/2012 *RADIOLOGY REPORT* Clinical Data: Hypertension, memory loss, right-sided weakness CT HEAD WITHOUT CONTRAST Technique: Contiguous axial images were obtained from the base of the skull through the vertex without contrast. Comparison: 04/24/2011 Findings: No evidence of parenchymal hemorrhage or extra-axial fluid collection. No mass lesion, mass effect, or midline shift. No CT evidence of acute infarction. Cerebral volume is age appropriate. No ventriculomegaly. The visualized paranasal sinuses are essentially clear. The mastoid air cells are unopacified. No evidence of calvarial fracture. IMPRESSION: No evidence of acute intracranial abnormality. Original Report Authenticated By: Charline Bills, M.D.  Mri Brain Without Contrast  07/02/2012 *RADIOLOGY REPORT* Clinical Data: Right-sided weakness. Diabetes and hypertension. Obesity. MRI  HEAD WITHOUT CONTRAST MRA HEAD WITHOUT CONTRAST Technique: Multiplanar, multiecho pulse sequences of the brain and surrounding structures were obtained without intravenous contrast. Angiographic images of the head were obtained using MRA technique without contrast. Comparison: CT head most recent 07/01/2012 MRI HEAD Findings: There is no evidence for acute infarction, intracranial hemorrhage, mass lesion, hydrocephalus, or extra-axial fluid. There is no atrophy or significant white matter disease. Prominent perivascular  spaces surround the right greater than left anterior commissure. No foci of chronic hemorrhage. Symmetric temporal lobes. Widely patent intracranial vasculature. Negative orbits, sinuses, and mastoids. There is slightly heterogeneous signal in the clivus without areas of focal osseous destruction. Marrow heterogeneity can be associate with wide variety of nonspecific abnormalities including anemia, obesity, or chronic disease. The calvarium is grossly intact. Upper cervical region unremarkable. No pannus surrounding the odontoid in this patient with history rheumatoid arthritis. Mild tonsillar ectopia without frank Chiari I malformation. Partially empty sella. IMPRESSION: No acute intracranial findings. No evidence for intracranial stroke or bleed. Partial empty sella. Slight heterogeneity of the clivus; see discussion above MRA HEAD Findings: The internal carotid arteries are widely patent. The basilar artery is widely patent. The vertebrals are codominant. There is no proximal stenosis of the anterior, middle, or posterior cerebral arteries. There is no cerebellar branch occlusion. There is no visible intracranial aneurysm or distal vessel narrowing to suggest vasculitis. IMPRESSION: Negative MRA intracranial circulation. Original Report Authenticated By: Davonna Belling, M.D.  US Transvaginal Non-ob  06/28/2012 *RADIOLOGY REPORT* Clinical Data: Right lower quadrant pain and abdominal pain for 3  weeks. TRANSABDOMINAL AND TRANSVAGINAL ULTRASOUND OF PELVIS Technique: Both transabdominal and transvaginal ultrasound examinations of the pelvis were performed. Transabdominal technique was performed for global imaging of the pelvis including uterus, ovaries, adnexal regions, and pelvic cul-de-sac. It was necessary to proceed with endovaginal exam following the transabdominal exam to visualize the endometrium and ovaries. Comparison: CT abdomen and pelvis 01/24/2012. Ultrasound pelvis 01/24/2012. Findings: Uterus: The uterus is anteverted and measures 11 x 5.8 x 6.4 cm. No focal myometrial masses. Cysts in the cervical region consistent with Nabothian cysts. Endometrium: Endometrial stripe thickness is measured at 1.4 cm. No endometrial fluid collections. Right ovary: The right ovary measures 3.6 x 2.3 x 2.1 cm. There is a simple cyst measuring 1.9 cm diameter. This is likely to be a functional cyst. No abnormal adnexal masses. Flow is demonstrated in the right ovary on color flow Doppler imaging. Left ovary: Left ovary measures 1.9 x 1.3 x 1.6 cm. Normal follicular changes. No abnormal adnexal masses. Other findings: No free fluid IMPRESSION: Normal study. No evidence of pelvic mass or other significant abnormality. No significant changes since previous study. Original Report Authenticated By: Burman Nieves, M.D.  US Pelvis Complete  06/28/2012 *RADIOLOGY REPORT* Clinical Data: Right lower quadrant pain and abdominal pain for 3 weeks. TRANSABDOMINAL AND TRANSVAGINAL ULTRASOUND OF PELVIS Technique: Both transabdominal and transvaginal ultrasound examinations of the pelvis were performed. Transabdominal technique was performed for global imaging of the pelvis including uterus, ovaries, adnexal regions, and pelvic cul-de-sac. It was necessary to proceed with endovaginal exam following the transabdominal exam to visualize the endometrium and ovaries. Comparison: CT abdomen and pelvis 01/24/2012. Ultrasound pelvis  01/24/2012. Findings: Uterus: The uterus is anteverted and measures 11 x 5.8 x 6.4 cm. No focal myometrial masses. Cysts in the cervical region consistent with Nabothian cysts. Endometrium: Endometrial stripe thickness is measured at 1.4 cm. No endometrial fluid collections. Right ovary: The right ovary measures 3.6 x 2.3 x 2.1 cm. There is a simple cyst measuring 1.9 cm diameter. This is likely to be a functional cyst. No abnormal adnexal masses. Flow is demonstrated in the right ovary on color flow Doppler imaging. Left ovary: Left ovary measures 1.9 x 1.3 x 1.6 cm. Normal follicular changes. No abnormal adnexal masses. Other findings: No free fluid IMPRESSION: Normal study. No evidence of pelvic mass or other significant abnormality. No significant changes  since previous study. Original Report Authenticated By: Burman Nieves, M.D.  Mr Mra Head/brain Wo Cm  07/02/2012 *RADIOLOGY REPORT* Clinical Data: Right-sided weakness. Diabetes and hypertension. Obesity. MRI HEAD WITHOUT CONTRAST MRA HEAD WITHOUT CONTRAST Technique: Multiplanar, multiecho pulse sequences of the brain and surrounding structures were obtained without intravenous contrast. Angiographic images of the head were obtained using MRA technique without contrast. Comparison: CT head most recent 07/01/2012 MRI HEAD Findings: There is no evidence for acute infarction, intracranial hemorrhage, mass lesion, hydrocephalus, or extra-axial fluid. There is no atrophy or significant white matter disease. Prominent perivascular spaces surround the right greater than left anterior commissure. No foci of chronic hemorrhage. Symmetric temporal lobes. Widely patent intracranial vasculature. Negative orbits, sinuses, and mastoids. There is slightly heterogeneous signal in the clivus without areas of focal osseous destruction. Marrow heterogeneity can be associate with wide variety of nonspecific abnormalities including anemia, obesity, or chronic disease. The  calvarium is grossly intact. Upper cervical region unremarkable. No pannus surrounding the odontoid in this patient with history rheumatoid arthritis. Mild tonsillar ectopia without frank Chiari I malformation. Partially empty sella. IMPRESSION: No acute intracranial findings. No evidence for intracranial stroke or bleed. Partial empty sella. Slight heterogeneity of the clivus; see discussion above MRA HEAD Findings: The internal carotid arteries are widely patent. The basilar artery is widely patent. The vertebrals are codominant. There is no proximal stenosis of the anterior, middle, or posterior cerebral arteries. There is no cerebellar branch occlusion. There is no visible intracranial aneurysm or distal vessel narrowing to suggest vasculitis. IMPRESSION: Negative MRA intracranial circulation. Original Report Authenticated By: Davonna Belling, M.D.  Microbiology:  No results found for this or any previous visit (from the past 240 hour(s)).  Labs:  Basic Metabolic Panel:   Lab  07/01/12 1830   NA  138   K  3.6   CL  101   CO2  27   GLUCOSE  85   BUN  15   CREATININE  0.75   CALCIUM  9.6   MG  --   PHOS  --    Liver Function Tests:  No results found for this basename: AST:5,ALT:5,ALKPHOS:5,BILITOT:5,PROT:5,ALBUMIN:5 in the last 168 hours  No results found for this basename: LIPASE:5,AMYLASE:5 in the last 168 hours  No results found for this basename: AMMONIA:5 in the last 168 hours  CBC:   Lab  07/02/12 0624  07/01/12 1830   WBC  6.8  7.4   NEUTROABS  --  3.9   HGB  12.0  13.0   HCT  35.8*  37.7   MCV  84.6  84.3   PLT  162  211    Cardiac Enzymes:  No results found for this basename: CKTOTAL:5,CKMB:5,CKMBINDEX:5,TROPONINI:5 in the last 168 hours  BNP:  BNP (last 3 results)  No results found for this basename: PROBNP:3 in the last 8760 hours  CBG:   Lab  07/02/12 1147  07/02/12 0638  07/01/12 2003  07/01/12 1826   GLUCAP  127*  99  76  89    Signed:  Summit Borchardt S  [facility]  07/02/2012, 1:54 PM

## 2012-10-11 ENCOUNTER — Other Ambulatory Visit: Payer: Self-pay

## 2013-03-16 ENCOUNTER — Ambulatory Visit: Payer: Self-pay | Admitting: Obstetrics and Gynecology

## 2013-07-02 ENCOUNTER — Other Ambulatory Visit: Payer: Self-pay

## 2013-10-25 DIAGNOSIS — M329 Systemic lupus erythematosus, unspecified: Secondary | ICD-10-CM | POA: Insufficient documentation

## 2013-10-25 DIAGNOSIS — IMO0002 Reserved for concepts with insufficient information to code with codable children: Secondary | ICD-10-CM

## 2013-10-25 HISTORY — DX: Reserved for concepts with insufficient information to code with codable children: IMO0002

## 2013-10-25 HISTORY — DX: Systemic lupus erythematosus, unspecified: M32.9

## 2014-02-16 ENCOUNTER — Encounter (HOSPITAL_COMMUNITY): Payer: Self-pay | Admitting: *Deleted

## 2014-02-16 ENCOUNTER — Inpatient Hospital Stay (HOSPITAL_COMMUNITY): Payer: Self-pay

## 2014-02-16 ENCOUNTER — Inpatient Hospital Stay (HOSPITAL_COMMUNITY)
Admission: AD | Admit: 2014-02-16 | Discharge: 2014-02-16 | Disposition: A | Discharge status: Home / Self Care | Payer: Self-pay | Source: Ambulatory Visit | Attending: Obstetrics and Gynecology | Admitting: Obstetrics and Gynecology

## 2014-02-16 DIAGNOSIS — E119 Type 2 diabetes mellitus without complications: Secondary | ICD-10-CM | POA: Insufficient documentation

## 2014-02-16 DIAGNOSIS — M069 Rheumatoid arthritis, unspecified: Secondary | ICD-10-CM | POA: Insufficient documentation

## 2014-02-16 DIAGNOSIS — R109 Unspecified abdominal pain: Secondary | ICD-10-CM | POA: Insufficient documentation

## 2014-02-16 DIAGNOSIS — Z8673 Personal history of transient ischemic attack (TIA), and cerebral infarction without residual deficits: Secondary | ICD-10-CM | POA: Insufficient documentation

## 2014-02-16 DIAGNOSIS — F17200 Nicotine dependence, unspecified, uncomplicated: Secondary | ICD-10-CM | POA: Insufficient documentation

## 2014-02-16 DIAGNOSIS — I10 Essential (primary) hypertension: Secondary | ICD-10-CM | POA: Insufficient documentation

## 2014-02-16 DIAGNOSIS — B9689 Other specified bacterial agents as the cause of diseases classified elsewhere: Secondary | ICD-10-CM | POA: Insufficient documentation

## 2014-02-16 DIAGNOSIS — N760 Acute vaginitis: Secondary | ICD-10-CM | POA: Insufficient documentation

## 2014-02-16 DIAGNOSIS — M329 Systemic lupus erythematosus, unspecified: Secondary | ICD-10-CM | POA: Insufficient documentation

## 2014-02-16 DIAGNOSIS — J45909 Unspecified asthma, uncomplicated: Secondary | ICD-10-CM | POA: Insufficient documentation

## 2014-02-16 DIAGNOSIS — N83209 Unspecified ovarian cyst, unspecified side: Secondary | ICD-10-CM | POA: Insufficient documentation

## 2014-02-16 DIAGNOSIS — Z6841 Body Mass Index (BMI) 40.0 and over, adult: Secondary | ICD-10-CM | POA: Insufficient documentation

## 2014-02-16 DIAGNOSIS — K219 Gastro-esophageal reflux disease without esophagitis: Secondary | ICD-10-CM | POA: Insufficient documentation

## 2014-02-16 DIAGNOSIS — A499 Bacterial infection, unspecified: Secondary | ICD-10-CM | POA: Insufficient documentation

## 2014-02-16 HISTORY — DX: Systemic lupus erythematosus, unspecified: M32.9

## 2014-02-16 LAB — CBC WITH DIFFERENTIAL/PLATELET
Basophils Absolute: 0 10*3/uL (ref 0.0–0.1)
Basophils Relative: 0 % (ref 0–1)
Eosinophils Absolute: 0.1 10*3/uL (ref 0.0–0.7)
Eosinophils Relative: 1 % (ref 0–5)
HCT: 37.2 % (ref 36.0–46.0)
Hemoglobin: 12.6 g/dL (ref 12.0–15.0)
Lymphocytes Relative: 42 % (ref 12–46)
Lymphs Abs: 2.9 10*3/uL (ref 0.7–4.0)
MCH: 29.9 pg (ref 26.0–34.0)
MCHC: 33.9 g/dL (ref 30.0–36.0)
MCV: 88.2 fL (ref 78.0–100.0)
Monocytes Absolute: 0.6 10*3/uL (ref 0.1–1.0)
Monocytes Relative: 8 % (ref 3–12)
Neutro Abs: 3.3 10*3/uL (ref 1.7–7.7)
Neutrophils Relative %: 48 % (ref 43–77)
Platelets: 193 10*3/uL (ref 150–400)
RBC: 4.22 MIL/uL (ref 3.87–5.11)
RDW: 14.1 % (ref 11.5–15.5)
WBC: 6.9 10*3/uL (ref 4.0–10.5)

## 2014-02-16 LAB — COMPREHENSIVE METABOLIC PANEL
ALT: 10 U/L (ref 0–35)
AST: 14 U/L (ref 0–37)
Albumin: 3.8 g/dL (ref 3.5–5.2)
Alkaline Phosphatase: 70 U/L (ref 39–117)
BUN: 10 mg/dL (ref 6–23)
CO2: 25 mEq/L (ref 19–32)
Calcium: 9.2 mg/dL (ref 8.4–10.5)
Chloride: 102 mEq/L (ref 96–112)
Creatinine, Ser: 0.69 mg/dL (ref 0.50–1.10)
GFR calc Af Amer: >90 mL/min (ref >90)
GFR calc non Af Amer: >90 mL/min (ref >90)
Glucose, Bld: 83 mg/dL (ref 70–99)
Potassium: 4.1 mEq/L (ref 3.7–5.3)
Sodium: 138 mEq/L (ref 137–147)
Total Bilirubin: 0.4 mg/dL (ref 0.3–1.2)
Total Protein: 7.2 g/dL (ref 6.0–8.3)

## 2014-02-16 LAB — URINALYSIS, ROUTINE W REFLEX MICROSCOPIC
Bilirubin Urine: NEGATIVE
Glucose, UA: NEGATIVE mg/dL
Hgb urine dipstick: NEGATIVE
Ketones, ur: 15 mg/dL — AB
Nitrite: NEGATIVE
Protein, ur: NEGATIVE mg/dL
Specific Gravity, Urine: 1.025 (ref 1.005–1.030)
Urobilinogen, UA: 0.2 mg/dL (ref 0.0–1.0)
pH: 5.5 (ref 5.0–8.0)

## 2014-02-16 LAB — POCT PREGNANCY, URINE: Preg Test, Ur: NEGATIVE

## 2014-02-16 LAB — URINE MICROSCOPIC-ADD ON

## 2014-02-16 LAB — WET PREP, GENITAL
Trich, Wet Prep: NONE SEEN
Yeast Wet Prep HPF POC: NONE SEEN

## 2014-02-16 MED ORDER — METRONIDAZOLE 500 MG PO TABS: 500.0000 mg | ORAL_TABLET | Freq: Two times a day (BID) | ORAL | Status: DC

## 2014-02-16 MED ORDER — KETOROLAC TROMETHAMINE 60 MG/2ML IM SOLN
60.0000 mg | Freq: Once | INTRAMUSCULAR | Status: AC
  Administered 2014-02-16: 60 mg via INTRAMUSCULAR

## 2014-02-16 MED ORDER — KETOROLAC TROMETHAMINE 10 MG PO TABS: 10.0000 mg | ORAL_TABLET | Freq: Four times a day (QID) | ORAL | Status: DC | PRN

## 2014-02-16 NOTE — MAU Note (Signed)
Patient states she has had a BTL. States she started having right lower abdominal pain for about one week. States she will have a sharp pain in the front of the right thigh when she has the abdominal pain and she will start to have chest pain. States her period is late. Denies bleeding, discharge, or vomiting. Has nausea off and on with the pain.

## 2014-02-16 NOTE — MAU Provider Note (Signed)
CSN: [id number]     Arrival date & time 02/16/14  1732 History   None    Chief Complaint  Patient presents with  . Abdominal Pain     (Consider location/radiation/quality/duration/timing/severity/associated sxs/prior Treatment) Patient is a 33 y.o. female presenting with abdominal pain. The history is provided by the patient.  Abdominal Pain The primary symptoms of the illness include abdominal pain, fatigue and nausea. The primary symptoms of the illness do not include fever, shortness of breath, vomiting, diarrhea, hematemesis, dysuria, vaginal discharge or vaginal bleeding. The current episode started more than 2 days ago. The onset of the illness was gradual. The problem has been gradually worsening.  Abdominal Pain The current episode started more than 2 days ago. The onset quality is gradual. The problem has been gradually worsening. Associated symptoms include nausea. Pertinent negatives include no diarrhea, dysuria, fever or vomiting.   BREAH JOA is a 33 y.o. female who presents to the MAU with low abdominal pain that started about a week ago. She describes the pain as sharp, stabbing and is located in the RLQ and radiates to the vaginal area. The pain is similar to when she had ovarian cyst but worse. She rates her pain as 7/10. She has taken tylenol for pain without relief. The pain comes and goes. Nothing makes it better or worse. LMP 12/14/2013.   Past Medical History  Diagnosis Date  . Diabetes mellitus     A2DM during pregnancy. now on metformin.   [name] Hypertension   . Gonorrhea 2010    treated  . Chlamydia 2012    treated  . Pregnancy induced hypertension     pre E with pregnancies  . Asthma   . Rheumatoid arthritis(714.0)   . 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 SVT (supraventricular tachycardia)   . Complication of anesthesia     " they have trouble waking me up"  . Family history of anesthesia complication      mother & daughter   . Shortness of breath   . Sleep apnea     does not use cpap  . GERD (gastroesophageal reflux disease)   . Anemia   . Lupus 10-2013   Past Surgical History  Procedure Laterality Date  . Tubal ligation  2009  . Finger surgery  1993    left ring finger to correct bone "problem"  . Cholecystectomy  07/22/2011    Procedure: LAPAROSCOPIC CHOLECYSTECTOMY WITH INTRAOPERATIVE CHOLANGIOGRAM;  Surgeon: Kandis Cocking, MD;  Location: [facility];  Service: General;  Laterality: N/A;  . Ercp  07/24/2011    Procedure: ENDOSCOPIC RETROGRADE CHOLANGIOPANCREATOGRAPHY (ERCP);  Surgeon: Theda Belfast;  Location: [facility];  Service: Endoscopy;  Laterality: N/A;   Family History  Problem Relation Age of Onset  . Anesthesia problems Mother     difficult to arouse, SOB  . Diabetes Mother   . Hypertension Mother   . Stroke Mother   . Diabetes Father   . Hypertension Father   . Heart disease Father   . Cancer Maternal Aunt   . Cancer Maternal Grandmother    History  Substance Use Topics  . Smoking status: Current Every Day Smoker -- 0.25 packs/day for 4 years    Types: Cigarettes  . Smokeless tobacco: Never Used  . Alcohol Use: Yes     Comment: socially   OB History   Grav Para Term Preterm Abortions TAB SAB Ect Mult Living   9  6 0 6 3 0 3 0 0 6     Review of Systems  Constitutional: Positive for fatigue. Negative for fever.  Respiratory: Negative for shortness of breath.   Gastrointestinal: Positive for nausea and abdominal pain. Negative for vomiting, diarrhea and hematemesis.  Genitourinary: Negative for dysuria, vaginal bleeding and vaginal discharge.      Allergies  Amoxicillin and Dilaudid  Home Medications   Prior to Admission medications   Medication Sig Start Date End Date Taking? Authorizing Provider  albuterol (PROVENTIL HFA;VENTOLIN HFA) 108 (90 BASE) MCG/ACT inhaler Inhale 2 puffs into the lungs every 6 (six) hours as needed. For wheezing   Yes  Historical Provider, MD  aspirin EC 81 MG tablet Take 81 mg by mouth daily.   Yes Historical Provider, MD  beclomethasone (QVAR) 80 MCG/ACT inhaler Inhale 2 puffs into the lungs daily.    Yes Historical Provider, MD  Multiple Vitamins-Minerals (MULTIVITAMIN WITH MINERALS) tablet Take 1 tablet by mouth daily.     Yes Historical Provider, MD  ranitidine (ZANTAC) 75 MG tablet Take 75 mg by mouth daily as needed for heartburn.   Yes Historical Provider, MD   BP 144/90  Pulse 86  Temp(Src) 99 F (37.2 C) (Oral)  Resp 20  Ht 5' 6.5" (1.689 m)  Wt 323 lb (146.512 kg)  BMI 51.36 kg/m2  SpO2 100%  LMP 12/14/2013 Physical Exam  Nursing note and vitals reviewed. Constitutional: She is oriented to person, place, and time. No distress.  Morbidly obese  Eyes: EOM are normal.  Neck: Neck supple.  Pulmonary/Chest: Effort normal.  Abdominal: Soft. There is tenderness in the right upper quadrant. There is no rebound, no guarding and no CVA tenderness.  Genitourinary:  External genitalia without lesions, frothy discharge vaginal vault, positive CMT, right adnexal tenderness. Unable to palpate uterus due to patient habitus   Musculoskeletal: Normal range of motion.  Neurological: She is alert and oriented to person, place, and time. No cranial nerve deficit.  Skin: Skin is warm and dry.  Psychiatric: She has a normal mood and affect. Her behavior is normal.   Results for orders placed during the hospital encounter of 02/16/14 (from the past 24 hour(s))  POCT PREGNANCY, URINE     Status: None   Collection Time    02/16/14  6:04 PM      Result Value Ref Range   Preg Test, Ur NEGATIVE  NEGATIVE  URINALYSIS, ROUTINE W REFLEX MICROSCOPIC     Status: Abnormal   Collection Time    02/16/14  6:15 PM      Result Value Ref Range   Color, Urine YELLOW  YELLOW   APPearance CLEAR  CLEAR   Specific Gravity, Urine 1.025  1.005 - 1.030   pH 5.5  5.0 - 8.0   Glucose, UA NEGATIVE  NEGATIVE mg/dL   Hgb urine  dipstick NEGATIVE  NEGATIVE   Bilirubin Urine NEGATIVE  NEGATIVE   Ketones, ur 15 (*) NEGATIVE mg/dL   Protein, ur NEGATIVE  NEGATIVE mg/dL   Urobilinogen, UA 0.2  0.0 - 1.0 mg/dL   Nitrite NEGATIVE  NEGATIVE   Leukocytes, UA TRACE (*) NEGATIVE  URINE MICROSCOPIC-ADD ON     Status: Abnormal   Collection Time    02/16/14  6:15 PM      Result Value Ref Range   Squamous Epithelial / LPF RARE  RARE   WBC, UA 0-2  <3 WBC/hpf   RBC / HPF 3-6  <3 RBC/hpf   Bacteria,  UA MANY (*) RARE   Urine-Other MUCOUS PRESENT    CBC WITH DIFFERENTIAL     Status: None   Collection Time    02/16/14  6:55 PM      Result Value Ref Range   WBC 6.9  4.0 - 10.5 K/uL   RBC 4.22  3.87 - 5.11 MIL/uL   Hemoglobin 12.6  12.0 - 15.0 g/dL   HCT 09.9  83.3 - 82.5 %   MCV 88.2  78.0 - 100.0 fL   MCH 29.9  26.0 - 34.0 pg   MCHC 33.9  30.0 - 36.0 g/dL   RDW 05.3  97.6 - 73.4 %   Platelets 193  150 - 400 K/uL   Neutrophils Relative % 48  43 - 77 %   Neutro Abs 3.3  1.7 - 7.7 K/uL   Lymphocytes Relative 42  12 - 46 %   Lymphs Abs 2.9  0.7 - 4.0 K/uL   Monocytes Relative 8  3 - 12 %   Monocytes Absolute 0.6  0.1 - 1.0 K/uL   Eosinophils Relative 1  0 - 5 %   Eosinophils Absolute 0.1  0.0 - 0.7 K/uL   Basophils Relative 0  0 - 1 %   Basophils Absolute 0.0  0.0 - 0.1 K/uL  COMPREHENSIVE METABOLIC PANEL     Status: None   Collection Time    02/16/14  6:55 PM      Result Value Ref Range   Sodium 138  137 - 147 mEq/L   Potassium 4.1  3.7 - 5.3 mEq/L   Chloride 102  96 - 112 mEq/L   CO2 25  19 - 32 mEq/L   Glucose, Bld 83  70 - 99 mg/dL   BUN 10  6 - 23 mg/dL   Creatinine, Ser 1.93  0.50 - 1.10 mg/dL   Calcium 9.2  8.4 - 79.0 mg/dL   Total Protein 7.2  6.0 - 8.3 g/dL   Albumin 3.8  3.5 - 5.2 g/dL   AST 14  0 - 37 U/L   ALT 10  0 - 35 U/L   Alkaline Phosphatase 70  39 - 117 U/L   Total Bilirubin 0.4  0.3 - 1.2 mg/dL   GFR calc non Af Amer >90  >90 mL/min   GFR calc Af Amer >90  >90 mL/min  WET PREP, GENITAL      Status: Abnormal   Collection Time    02/16/14  7:34 PM      Result Value Ref Range   Yeast Wet Prep HPF POC NONE SEEN  NONE SEEN   Trich, Wet Prep NONE SEEN  NONE SEEN   Clue Cells Wet Prep HPF POC FEW (*) NONE SEEN   WBC, Wet Prep HPF POC FEW (*) NONE SEEN    ED Course  Procedures  Labs, ultrasound, pain management MDM  33 y.o. female with right adnexal tenderness. Patient is in ultrasound and care turned [place].   Dorathy Kinsman, CNM assumed care of patient at 2100 p.m. patient in ultrasound.  Pain resolved with Toradol.  US Transvaginal Non-ob  02/16/2014   CLINICAL DATA:  RIGHT adnexal tenderness for 1 week  EXAM: TRANSABDOMINAL AND TRANSVAGINAL ULTRASOUND OF PELVIS  TECHNIQUE: Both transabdominal and transvaginal ultrasound examinations of the pelvis were performed. Transabdominal technique was performed for global imaging of the pelvis including uterus, ovaries, adnexal regions, and pelvic cul-de-sac. It was necessary to proceed with endovaginal exam following the  transabdominal exam to visualize the endometrium.  COMPARISON:  06/28/2012  FINDINGS: Uterus  Measurements: 10.6 x 6.0 x 7.3 cm. Normal morphology without mass.  Endometrium  Thickness: 14 mm thick, upper normal. No endometrial fluid or focal abnormality.  Right ovary  Measurements: 3.5 x 2.1 x 2.4 cm. Small complex cyst question hemorrhagic within RIGHT ovary 1.9 x 1.7 x 1.7 cm. No additional adnexal mass.  Left ovary  Not visualized on either transabdominal or endovaginal imaging question related to obscuration by bowel  Other findings  No free fluid or additional adnexal masses.  IMPRESSION: Question small complex or hemorrhagic cyst RIGHT ovary 1.9 cm greatest size.  Nonvisualization of LEFT ovary.  Otherwise normal exam.   Electronically Signed   By: Ulyses Southward M.D.   On: 02/16/2014 21:14   US Pelvis Complete  02/16/2014   CLINICAL DATA:  RIGHT adnexal tenderness for 1 week  EXAM: TRANSABDOMINAL  AND TRANSVAGINAL ULTRASOUND OF PELVIS  TECHNIQUE: Both transabdominal and transvaginal ultrasound examinations of the pelvis were performed. Transabdominal technique was performed for global imaging of the pelvis including uterus, ovaries, adnexal regions, and pelvic cul-de-sac. It was necessary to proceed with endovaginal exam following the transabdominal exam to visualize the endometrium.  COMPARISON:  06/28/2012  FINDINGS: Uterus  Measurements: 10.6 x 6.0 x 7.3 cm. Normal morphology without mass.  Endometrium  Thickness: 14 mm thick, upper normal. No endometrial fluid or focal abnormality.  Right ovary  Measurements: 3.5 x 2.1 x 2.4 cm. Small complex cyst question hemorrhagic within RIGHT ovary 1.9 x 1.7 x 1.7 cm. No additional adnexal mass.  Left ovary  Not visualized on either transabdominal or endovaginal imaging question related to obscuration by bowel  Other findings  No free fluid or additional adnexal masses.  IMPRESSION: Question small complex or hemorrhagic cyst RIGHT ovary 1.9 cm greatest size.  Nonvisualization of LEFT ovary.  Otherwise normal exam.   Electronically Signed   By: Ulyses Southward M.D.   On: 02/16/2014 21:14    ASSESSMENT: Small right hemorrhagic cyst BV  PLAN: Discharge him in stable condition. Comfort measures. Follow-up Information   Follow up with Gynecologist. (As needed if symptoms worsen)       Follow up with [facility]. (As needed in emergencies)    Contact information:   [id number] [place] [address] [phone]        Medication List         albuterol 108 (90 BASE) MCG/ACT inhaler  Commonly known as:  PROVENTIL HFA;VENTOLIN HFA  Inhale 2 puffs into the lungs every 6 (six) hours as needed. For wheezing     aspirin EC 81 MG tablet  Take 81 mg by mouth daily.     beclomethasone 80 MCG/ACT inhaler  Commonly known as:  QVAR  Inhale 2 puffs into the lungs daily.     ketorolac 10  MG tablet  Commonly known as:  TORADOL  Take 1 tablet (10 mg total) by mouth every 6 (six) hours as needed for moderate pain or severe pain.     metroNIDAZOLE 500 MG tablet  Commonly known as:  FLAGYL  Take 1 tablet (500 mg total) by mouth 2 (two) times daily.     multivitamin with minerals tablet  Take 1 tablet by mouth daily.     ranitidine 75 MG tablet  Commonly known as:  ZANTAC  Take 75 mg by mouth daily as needed for heartburn.  [place]ia Smith, [place] 02/16/2014 9:47 PM

## 2014-02-16 NOTE — Discharge Instructions (Signed)
Ovarian Cyst °An ovarian cyst is a fluid-filled sac that forms on an ovary. The ovaries are small organs that produce eggs in women. Various types of cysts can form on the ovaries. Most are not cancerous. Many do not cause problems, and they often go away on their own. Some may cause symptoms and require treatment. Common types of ovarian cysts include: °· Functional cysts--These cysts may occur every month during the menstrual cycle. This is normal. The cysts usually go away with the next menstrual cycle if the woman does not get pregnant. Usually, there are no symptoms with a functional cyst. °· Endometrioma cysts--These cysts form from the tissue that lines the uterus. They are also called "chocolate cysts" because they become filled with blood that turns brown. This type of cyst can cause pain in the lower abdomen during intercourse and with your menstrual period. °· Cystadenoma cysts--This type develops from the cells on the outside of the ovary. These cysts can get very big and cause lower abdomen pain and pain with intercourse. This type of cyst can twist on itself, cut off its blood supply, and cause severe pain. It can also easily rupture and cause a lot of pain. °· Dermoid cysts--This type of cyst is sometimes found in both ovaries. These cysts may contain different kinds of body tissue, such as skin, teeth, hair, or cartilage. They usually do not cause symptoms unless they get very big. °· Theca lutein cysts--These cysts occur when too much of a certain hormone (human chorionic gonadotropin) is produced and overstimulates the ovaries to produce an egg. This is most common after procedures used to assist with the conception of a baby (in vitro fertilization). °CAUSES  °· Fertility drugs can cause a condition in which multiple large cysts are formed on the ovaries. This is called ovarian hyperstimulation syndrome. °· A condition called polycystic ovary syndrome can cause hormonal imbalances that can lead to  nonfunctional ovarian cysts. °SIGNS AND SYMPTOMS  °Many ovarian cysts do not cause symptoms. If symptoms are present, they may include: °· Pelvic pain or pressure. °· Pain in the lower abdomen. °· Pain during sexual intercourse. °· Increasing girth (swelling) of the abdomen. °· Abnormal menstrual periods. °· Increasing pain with menstrual periods. °· Stopping having menstrual periods without being pregnant. °DIAGNOSIS  °These cysts are commonly found during a routine or annual pelvic exam. Tests may be ordered to find out more about the cyst. These tests may include: °· Ultrasound. °· X-ray of the pelvis. °· CT scan. °· MRI. °· Blood tests. °TREATMENT  °Many ovarian cysts go away on their own without treatment. Your health care provider may want to check your cyst regularly for 2-3 months to see if it changes. For women in menopause, it is particularly important to monitor a cyst closely because of the higher rate of ovarian cancer in menopausal women. When treatment is needed, it may include any of the following: °· A procedure to drain the cyst (aspiration). This may be done using a long needle and ultrasound. It can also be done through a laparoscopic procedure. This involves using a thin, lighted tube with a tiny camera on the end (laparoscope) inserted through a small incision. °· Surgery to remove the whole cyst. This may be done using laparoscopic surgery or an open surgery involving a larger incision in the lower abdomen. °· Hormone treatment or birth control pills. These methods are sometimes used to help dissolve a cyst. °HOME CARE INSTRUCTIONS  °· Only take over-the-counter   or prescription medicines as directed by your health care provider. °· Follow up with your health care provider as directed. °· Get regular pelvic exams and Pap tests. °SEEK MEDICAL CARE IF:  °· Your periods are late, irregular, or painful, or they stop. °· Your pelvic pain or abdominal pain does not go away. °· Your abdomen becomes  larger or swollen. °· You have pressure on your bladder or trouble emptying your bladder completely. °· You have pain during sexual intercourse. °· You have feelings of fullness, pressure, or discomfort in your stomach. °· You lose weight for no apparent reason. °· You feel generally ill. °· You become constipated. °· You lose your appetite. °· You develop acne. °· You have an increase in body and facial hair. °· You are gaining weight, without changing your exercise and eating habits. °· You think you are pregnant. °SEEK IMMEDIATE MEDICAL CARE IF:  °· You have increasing abdominal pain. °· You feel sick to your stomach (nauseous), and you throw up (vomit). °· You develop a fever that comes on suddenly. °· You have abdominal pain during a bowel movement. °· Your menstrual periods become heavier than usual. °MAKE SURE YOU: °· Understand these instructions. °· Will watch your condition. °· Will get help right away if you are not doing well or get worse. °Document Released: 08/13/2005 Document Revised: 08/18/2013 Document Reviewed: 04/20/2013 °ExitCare® Patient Information ©2015 [employer]. This information is not intended to replace advice given to you by your health care provider. Make sure you discuss any questions you have with your health care provider. ° ° ° °Bacterial Vaginosis °Bacterial vaginosis is a vaginal infection that occurs when the normal balance of bacteria in the vagina is disrupted. It results from an overgrowth of certain bacteria. This is the most common vaginal infection in women of childbearing age. Treatment is important to prevent complications, especially in pregnant women, as it can cause a premature delivery. °CAUSES  °Bacterial vaginosis is caused by an increase in harmful bacteria that are normally present in smaller amounts in the vagina. Several different kinds of bacteria can cause bacterial vaginosis. However, the reason that the condition develops is not fully understood. °RISK  FACTORS °Certain activities or behaviors can put you at an increased risk of developing bacterial vaginosis, including: °· Having a new sex partner or multiple sex partners. °· Douching. °· Using an intrauterine device (IUD) for contraception. °Women do not get bacterial vaginosis from toilet seats, bedding, swimming pools, or contact with objects around them. °SIGNS AND SYMPTOMS  °Some women with bacterial vaginosis have no signs or symptoms. Common symptoms include: °· Grey vaginal discharge. °· A fishlike odor with discharge, especially after sexual intercourse. °· Itching or burning of the vagina and vulva. °· Burning or pain with urination. °DIAGNOSIS  °Your health care provider will take a medical history and examine the vagina for signs of bacterial vaginosis. A sample of vaginal fluid may be taken. Your health care provider will look at this sample under a microscope to check for bacteria and abnormal cells. A vaginal pH test may also be done.  °TREATMENT  °Bacterial vaginosis may be treated with antibiotic medicines. These may be given in the form of a pill or a vaginal cream. A second round of antibiotics may be prescribed if the condition comes back after treatment.  °HOME CARE INSTRUCTIONS  °· Only take over-the-counter or prescription medicines as directed by your health care provider. °· If antibiotic medicine was prescribed, take it as   directed. Make sure you finish it even if you start to feel better. °· Do not have sex until treatment is completed. °· Tell all sexual partners that you have a vaginal infection. They should see their health care provider and be treated if they have problems, such as a mild rash or itching. °· Practice safe sex by using condoms and only having one sex partner. °SEEK MEDICAL CARE IF:  °· Your symptoms are not improving after 3 days of treatment. °· You have increased discharge or pain. °· You have a fever. °MAKE SURE YOU:  °· Understand these instructions. °· Will watch  your condition. °· Will get help right away if you are not doing well or get worse. °FOR MORE INFORMATION  °Centers for Disease Control and Prevention, Division of STD Prevention: www.cdc.gov/std °American Sexual Health Association (ASHA): www.ashastd.org  °Document Released: 08/13/2005 Document Revised: 06/03/2013 Document Reviewed: 03/25/2013 °ExitCare® Patient Information ©2015 [employer]. This information is not intended to replace advice given to you by your health care provider. Make sure you discuss any questions you have with your health care provider. ° °

## 2014-02-17 LAB — GC/CHLAMYDIA PROBE AMP
CT Probe RNA: NEGATIVE
GC Probe RNA: NEGATIVE

## 2014-02-18 NOTE — MA[facility] Provider Note (Signed)
Attestation of Attending Supervision of Advanced Practitioner (CNM/NP): Evaluation and management procedures were performed by the Advanced Practitioner under my supervision and collaboration.  I have reviewed the Advanced Practitioner's note and chart, and I agree with the management and plan.  CONSTANT,PEGGY 02/18/2014 9:09 AM

## 2014-04-11 ENCOUNTER — Other Ambulatory Visit: Payer: Self-pay | Admitting: Advanced Practice Midwife

## 2014-04-25 ENCOUNTER — Emergency Department (HOSPITAL_COMMUNITY): Payer: Self-pay

## 2014-04-25 ENCOUNTER — Encounter (HOSPITAL_COMMUNITY): Payer: Self-pay | Admitting: Emergency Medicine

## 2014-04-25 ENCOUNTER — Emergency Department (HOSPITAL_COMMUNITY)
Admission: EM | Admit: 2014-04-25 | Discharge: 2014-04-25 | Disposition: A | Discharge status: Home / Self Care | Payer: Self-pay | Attending: Emergency Medicine | Admitting: Emergency Medicine

## 2014-04-25 DIAGNOSIS — I10 Essential (primary) hypertension: Secondary | ICD-10-CM | POA: Insufficient documentation

## 2014-04-25 DIAGNOSIS — Z8673 Personal history of transient ischemic attack (TIA), and cerebral infarction without residual deficits: Secondary | ICD-10-CM | POA: Insufficient documentation

## 2014-04-25 DIAGNOSIS — R509 Fever, unspecified: Secondary | ICD-10-CM | POA: Insufficient documentation

## 2014-04-25 DIAGNOSIS — L739 Follicular disorder, unspecified: Secondary | ICD-10-CM

## 2014-04-25 DIAGNOSIS — R51 Headache: Secondary | ICD-10-CM | POA: Insufficient documentation

## 2014-04-25 DIAGNOSIS — M069 Rheumatoid arthritis, unspecified: Secondary | ICD-10-CM | POA: Insufficient documentation

## 2014-04-25 DIAGNOSIS — Z8619 Personal history of other infectious and parasitic diseases: Secondary | ICD-10-CM | POA: Insufficient documentation

## 2014-04-25 DIAGNOSIS — K219 Gastro-esophageal reflux disease without esophagitis: Secondary | ICD-10-CM | POA: Insufficient documentation

## 2014-04-25 DIAGNOSIS — F17200 Nicotine dependence, unspecified, uncomplicated: Secondary | ICD-10-CM | POA: Insufficient documentation

## 2014-04-25 DIAGNOSIS — Z880 Allergy status to penicillin: Secondary | ICD-10-CM | POA: Insufficient documentation

## 2014-04-25 DIAGNOSIS — Z3202 Encounter for pregnancy test, result negative: Secondary | ICD-10-CM | POA: Insufficient documentation

## 2014-04-25 DIAGNOSIS — L678 Other hair color and hair shaft abnormalities: Secondary | ICD-10-CM | POA: Insufficient documentation

## 2014-04-25 DIAGNOSIS — D649 Anemia, unspecified: Secondary | ICD-10-CM | POA: Insufficient documentation

## 2014-04-25 DIAGNOSIS — R21 Rash and other nonspecific skin eruption: Secondary | ICD-10-CM | POA: Insufficient documentation

## 2014-04-25 DIAGNOSIS — J45909 Unspecified asthma, uncomplicated: Secondary | ICD-10-CM | POA: Insufficient documentation

## 2014-04-25 DIAGNOSIS — Z79899 Other long term (current) drug therapy: Secondary | ICD-10-CM | POA: Insufficient documentation

## 2014-04-25 DIAGNOSIS — E119 Type 2 diabetes mellitus without complications: Secondary | ICD-10-CM | POA: Insufficient documentation

## 2014-04-25 DIAGNOSIS — R0789 Other chest pain: Secondary | ICD-10-CM | POA: Insufficient documentation

## 2014-04-25 DIAGNOSIS — M329 Systemic lupus erythematosus, unspecified: Secondary | ICD-10-CM | POA: Insufficient documentation

## 2014-04-25 DIAGNOSIS — R519 Headache, unspecified: Secondary | ICD-10-CM

## 2014-04-25 DIAGNOSIS — Z7982 Long term (current) use of aspirin: Secondary | ICD-10-CM | POA: Insufficient documentation

## 2014-04-25 DIAGNOSIS — L738 Other specified follicular disorders: Secondary | ICD-10-CM | POA: Insufficient documentation

## 2014-04-25 DIAGNOSIS — R52 Pain, unspecified: Secondary | ICD-10-CM | POA: Insufficient documentation

## 2014-04-25 DIAGNOSIS — R109 Unspecified abdominal pain: Secondary | ICD-10-CM | POA: Insufficient documentation

## 2014-04-25 DIAGNOSIS — R002 Palpitations: Secondary | ICD-10-CM | POA: Insufficient documentation

## 2014-04-25 LAB — CBC
HCT: 37.7 % (ref 36.0–46.0)
Hemoglobin: 12.7 g/dL (ref 12.0–15.0)
MCH: 28.9 pg (ref 26.0–34.0)
MCHC: 33.7 g/dL (ref 30.0–36.0)
MCV: 85.9 fL (ref 78.0–100.0)
Platelets: 206 10*3/uL (ref 150–400)
RBC: 4.39 MIL/uL (ref 3.87–5.11)
RDW: 13.8 % (ref 11.5–15.5)
WBC: 7 10*3/uL (ref 4.0–10.5)

## 2014-04-25 LAB — COMPREHENSIVE METABOLIC PANEL
ALT: 9 U/L (ref 0–35)
AST: 12 U/L (ref 0–37)
Albumin: 3.4 g/dL — ABNORMAL LOW (ref 3.5–5.2)
Alkaline Phosphatase: 76 U/L (ref 39–117)
Anion gap: 11 (ref 5–15)
BUN: 16 mg/dL (ref 6–23)
CO2: 23 mEq/L (ref 19–32)
Calcium: 9.1 mg/dL (ref 8.4–10.5)
Chloride: 102 mEq/L (ref 96–112)
Creatinine, Ser: 0.69 mg/dL (ref 0.50–1.10)
GFR calc Af Amer: >90 mL/min (ref >90)
GFR calc non Af Amer: >90 mL/min (ref >90)
Glucose, Bld: 90 mg/dL (ref 70–99)
Potassium: 4.2 mEq/L (ref 3.7–5.3)
Sodium: 136 mEq/L — ABNORMAL LOW (ref 137–147)
Total Bilirubin: <0.2 mg/dL — ABNORMAL LOW (ref 0.3–1.2)
Total Protein: 7.4 g/dL (ref 6.0–8.3)

## 2014-04-25 LAB — URINALYSIS, ROUTINE W REFLEX MICROSCOPIC
Bilirubin Urine: NEGATIVE
Glucose, UA: NEGATIVE mg/dL
Hgb urine dipstick: NEGATIVE
Ketones, ur: NEGATIVE mg/dL
Nitrite: NEGATIVE
Protein, ur: NEGATIVE mg/dL
Specific Gravity, Urine: 1.023 (ref 1.005–1.030)
Urobilinogen, UA: 0.2 mg/dL (ref 0.0–1.0)
pH: 6 (ref 5.0–8.0)

## 2014-04-25 LAB — URINE MICROSCOPIC-ADD ON

## 2014-04-25 LAB — I-STAT TROPONIN, ED: Troponin i, poc: 0.01 ng/mL (ref 0.00–0.08)

## 2014-04-25 LAB — POC URINE PREG, ED: Preg Test, Ur: NEGATIVE

## 2014-04-25 LAB — LIPASE, BLOOD: Lipase: 20 U/L (ref 11–59)

## 2014-04-25 MED ORDER — KETOROLAC TROMETHAMINE 60 MG/2ML IM SOLN
60.0000 mg | Freq: Once | INTRAMUSCULAR | Status: DC
  Filled 2014-04-25: qty 2

## 2014-04-25 MED ORDER — TRAMADOL HCL 50 MG PO TABS
50.0000 mg | ORAL_TABLET | Freq: Once | ORAL | Status: AC
  Administered 2014-04-25: 50 mg via ORAL
  Filled 2014-04-25: qty 1

## 2014-04-25 MED ORDER — KETOROLAC TROMETHAMINE 30 MG/ML IJ SOLN
30.0000 mg | Freq: Once | INTRAMUSCULAR | Status: AC
Start: 2014-04-25 — End: 2014-04-25
  Administered 2014-04-25: 30 mg via INTRAVENOUS
  Filled 2014-04-25: qty 1

## 2014-04-25 MED ORDER — SULFAMETHOXAZOLE-TRIMETHOPRIM 800-160 MG PO TABS: 1.0000 | ORAL_TABLET | Freq: Two times a day (BID) | ORAL | Status: DC

## 2014-04-25 MED ORDER — HYDROCHLOROTHIAZIDE 25 MG PO TABS: 25.0000 mg | ORAL_TABLET | Freq: Every day | ORAL | Status: DC

## 2014-04-25 MED ORDER — PROCHLORPERAZINE EDISYLATE 5 MG/ML IJ SOLN
10.0000 mg | Freq: Four times a day (QID) | INTRAMUSCULAR | Status: DC | PRN
  Administered 2014-04-25: 10 mg via INTRAVENOUS

## 2014-04-25 MED ORDER — DIPHENHYDRAMINE HCL 50 MG/ML IJ SOLN
25.0000 mg | Freq: Once | INTRAMUSCULAR | Status: AC
  Administered 2014-04-25: 25 mg via INTRAMUSCULAR
  Filled 2014-04-25: qty 1

## 2014-04-25 MED ORDER — DEXAMETHASONE SODIUM PHOSPHATE 10 MG/ML IJ SOLN
10.0000 mg | Freq: Once | INTRAMUSCULAR | Status: DC
  Filled 2014-04-25: qty 1

## 2014-04-25 MED ORDER — PROCHLORPERAZINE EDISYLATE 5 MG/ML IJ SOLN
10.0000 mg | Freq: Four times a day (QID) | INTRAMUSCULAR | Status: DC | PRN
  Filled 2014-04-25: qty 2

## 2014-04-25 MED ORDER — DEXAMETHASONE SODIUM PHOSPHATE 10 MG/ML IJ SOLN
10.0000 mg | Freq: Once | INTRAMUSCULAR | Status: AC
  Administered 2014-04-25: 10 mg via INTRAVENOUS

## 2014-04-25 NOTE — ED Notes (Signed)
Pt c/o headache sttaes took tylenol and motrin at home pta and that tramadol will work

## 2014-04-25 NOTE — ED Notes (Signed)
Brought pt back to room; pt given gown to place on; Poll[place], MD present in room; Marcelle Smiling, NT aware of pt

## 2014-04-25 NOTE — ED Notes (Signed)
For 2 weeks she has been very tired and has had fleeting episodes of mid-chest pain that radiates up into her rt neck then up into her rt head.  She was diagnosed with lupus in feb no insurance.  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

## 2014-04-25 NOTE — ED Provider Notes (Addendum)
CSN: [id number]     Arrival date & time 04/25/14  1505 History   First MD Initiated Contact with Patient 04/25/14 1816     Chief Complaint  Patient presents with  . Headache  . Chest Pain  . Palpitations     (Consider location/radiation/quality/duration/timing/severity/associated sxs/prior Treatment) HPI Comments: Patient presents to ER for evaluation of many different symptoms. Patient reports that she has been feeling weak for some time. She reports that several months ago she was diagnosed with lupus, has not had any ability to followup with her primary physician. She has been experiencing chest pain. She reports sharp stabbing pains that come on suddenly and last for less than a minute and then resolved. This does not occur with exertion or motion, occurs randomly. She has not had any heart palpitations or shortness of breath associated with the pain. Patient reports a history of SVT, but this does not feel like her SVT.  She reports that she was treated for ovarian cyst several months ago at [facility]. She has not regained her menstrual cycle yet, but feels like she might be starting her period. She has a headache that is similar to her premenstrual headache and some generalized abdominal cramping.  She thinks she is running fevers at home. She has not had any infectious symptoms, however. She denies sore throat, sinus problems, cough. She also has noticed intermittent hives. She reports brachial itchy hives on her extremities intermittently that resolves when she takes Benadryl. She has not had any new medications, foods, topical exposures to explain the hives.  Patient also complains of intermittent skin abscesses. Patient reports that she has had an area on her right flank as well troponin drain multiple times recently. This area has healed and the last one to 2 weeks. She does have a small abscess in her pubic area currently.  Patient is a 33 y.o. female presenting with headaches,  chest pain, and palpitations.  Headache Associated symptoms: abdominal pain and fever   Associated symptoms: no cough   Chest Pain Associated symptoms: abdominal pain, fever, headache and palpitations   Associated symptoms: no cough and no shortness of breath   Palpitations Associated symptoms: chest pain   Associated symptoms: no cough and no shortness of breath     Past Medical History  Diagnosis Date  . Diabetes mellitus     A2DM during pregnancy. now on metformin.   [name] Hypertension   . Gonorrhea 2010    treated  . Chlamydia 2012    treated  . Pregnancy induced hypertension     pre E with pregnancies  . Asthma   . Rheumatoid arthritis(714.0)   . 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 SVT (supraventricular tachycardia)   . Complication of anesthesia     " they have trouble waking me up"  . Family history of anesthesia complication     mother & daughter   . Shortness of breath   . Sleep apnea     does not use cpap  . GERD (gastroesophageal reflux disease)   . Anemia   . Lupus 10-2013   Past Surgical History  Procedure Laterality Date  . Tubal ligation  2009  . Finger surgery  1993    left ring finger to correct bone "problem"  . Cholecystectomy  07/22/2011    Procedure: LAPAROSCOPIC CHOLECYSTECTOMY WITH INTRAOPERATIVE CHOLANGIOGRAM;  Surgeon: Kandis Cocking, MD;  Location: [facility];  Service: General;  Laterality: N/A;  .  Ercp  07/24/2011    Procedure: ENDOSCOPIC RETROGRADE CHOLANGIOPANCREATOGRAPHY (ERCP);  Surgeon: Theda Belfast;  Location: [facility];  Service: Endoscopy;  Laterality: N/A;   Family History  Problem Relation Age of Onset  . Anesthesia problems Mother     difficult to arouse, SOB  . Diabetes Mother   . Hypertension Mother   . Stroke Mother   . Diabetes Father   . Hypertension Father   . Heart disease Father   . Cancer Maternal Aunt   . Cancer Maternal Grandmother    History  Substance Use Topics    . Smoking status: Current Every Day Smoker -- 0.25 packs/day for 4 years    Types: Cigarettes  . Smokeless tobacco: Never Used  . Alcohol Use: Yes     Comment: socially   OB History   Grav Para Term Preterm Abortions TAB SAB Ect Mult Living   9 6 0 6 3 0 3 0 0 6      Review of Systems  Constitutional: Positive for fever.  Respiratory: Negative for cough and shortness of breath.   Cardiovascular: Positive for chest pain and palpitations.  Gastrointestinal: Positive for abdominal pain.  Genitourinary: Negative for vaginal bleeding.  Skin: Positive for rash.  Neurological: Positive for headaches.  All other systems reviewed and are negative.     Allergies  Amoxicillin and Dilaudid  Home Medications   Prior to Admission medications   Medication Sig Start Date End Date Taking? Authorizing Provider  albuterol (PROVENTIL HFA;VENTOLIN HFA) 108 (90 BASE) MCG/ACT inhaler Inhale 2 puffs into the lungs every 6 (six) hours as needed. For wheezing    Historical Provider, MD  aspirin EC 81 MG tablet Take 81 mg by mouth daily.    Historical Provider, MD  beclomethasone (QVAR) 80 MCG/ACT inhaler Inhale 2 puffs into the lungs daily.     Historical Provider, MD  ketorolac (TORADOL) 10 MG tablet Take 1 tablet (10 mg total) by mouth every 6 (six) hours as needed for moderate pain or severe pain. 02/16/14   02/18/14, CNM  metroNIDAZOLE (FLAGYL) 500 MG tablet Take 1 tablet (500 mg total) by mouth 2 (two) times daily. 02/16/14   02/18/14, CNM  Multiple Vitamins-Minerals (MULTIVITAMIN WITH MINERALS) tablet Take 1 tablet by mouth daily.      Historical Provider, MD  ranitidine (ZANTAC) 75 MG tablet Take 75 mg by mouth daily as needed for heartburn.    Historical Provider, MD   BP 174/118  Pulse 106  Temp(Src) 98.8 F (37.1 C) (Oral)  Resp 18  SpO2 98%  LMP 01/03/2014 Physical Exam  Constitutional: She is oriented to person, place, and time. She appears well-developed and  well-nourished. No distress.  HENT:  Head: Normocephalic and atraumatic.  Right Ear: Hearing normal.  Left Ear: Hearing normal.  Nose: Nose normal.  Mouth/Throat: Oropharynx is clear and moist and mucous membranes are normal.  Eyes: Conjunctivae and EOM are normal. Pupils are equal, round, and reactive to light.  Neck: Normal range of motion. Neck supple.  Cardiovascular: Regular rhythm, S1 normal and S2 normal.  Exam reveals no gallop and no friction rub.   No murmur heard. Pulmonary/Chest: Effort normal and breath sounds normal. No respiratory distress. She exhibits no tenderness.  Abdominal: Soft. Normal appearance and bowel sounds are normal. There is no hepatosplenomegaly. There is no tenderness. There is no rebound, no guarding, no tenderness at McBurney's point and negative Murphy's sign. No hernia.  Musculoskeletal: Normal range of motion.  Neurological:  She is alert and oriented to person, place, and time. She has normal strength. No cranial nerve deficit or sensory deficit. Coordination normal. GCS eye subscore is 4. GCS verbal subscore is 5. GCS motor subscore is 6.  Skin: Skin is warm, dry and intact. No rash noted. No cyanosis.     Psychiatric: She has a normal mood and affect. Her speech is normal and behavior is normal. Thought content normal.    ED Course  Procedures (including critical care time) Labs Review Labs Reviewed  COMPREHENSIVE METABOLIC PANEL - Abnormal; Notable for the following:    Sodium 136 (*)    Albumin 3.4 (*)    Total Bilirubin <0.2 (*)    All other components within normal limits  CBC  LIPASE, BLOOD  URINALYSIS, ROUTINE W REFLEX MICROSCOPIC  I-STAT TROPOININ, ED  POC URINE PREG, ED    Imaging Review Dg Chest 2 View  04/25/2014   CLINICAL DATA:  33 year old female with chest pain and fatigue.  EXAM: CHEST  2 VIEW  COMPARISON:  07/10/2010 and prior chest radiographs  FINDINGS: The cardiomediastinal silhouette is unremarkable.  There is no  evidence of focal airspace disease, pulmonary edema, suspicious pulmonary nodule/mass, pleural effusion, or pneumothorax. No acute bony abnormalities are identified.  IMPRESSION: No active cardiopulmonary disease.   Electronically Signed   By: Laveda Abbe M.D.   On: 04/25/2014 17:41     EKG Interpretation   Date/Time:  Sunday April 25 2014 15:24:59 EDT Ventricular Rate:  94 PR Interval:  154 QRS Duration: 88 QT Interval:  330 QTC Calculation: 412 R Axis:   71 Text Interpretation:  Normal sinus rhythm Normal ECG Confirmed by Chrisotpher Rivero   MD, Ezabella Teska [phone]) on 04/25/2014 6:26:09 PM      MDM   Final diagnoses:  None   headache  Chest pain  Palpitations  Folliculitis  Hypertension, chronic untreated  Patient presents with multiple problems. She reports generalized weakness. She reports that she had a fever last week. At that time she was having some issues with skin abscesses but these areas have healed and she is afebrile currently. She has a small area of folliculitis on the pubic region, does not require any drainage currently. Will treat with Bactrim.  Patient reports that she has a history of rheumatoid arthritis. She was recently diagnosed with lupus when she was visiting her mother in [place]. She does not currently have a rheumatologist. She has not seen one in 14 years. She has had difficulty obtaining followup she does not have insurance currently.  Patient is complaining of headache. She does have a history of migraines. This feels different than her migraines. She has had a chronic pain on the right side of her head without light sensitivity or photosensitivity. She is currently afebrile. She has no meningismus signs. CT head was unremarkable. Patient treated with Toradol, Compazine, Benadryl, Decadron.  The patient did have episodes of chest pain. These are very atypical. She reported a sharp stabbing pain that lasted for a minute at a time. It was not related to exertion  there is no shortness of breath. There is nothing to indicate PE. Wells/PERC negative. EKG normal. Troponin negative. No further workup necessary. This is not cardiac in origin.  Patient does have a history of hypertension. She has had elevated blood pressures here in the ER. She reports being on multiple medications in the past, but that they have actually stop working. She currently is not on any medications. Will restart her most  recent antihypertensive, she is trying to figure out which one that was.     Gilda Crease, MD 04/25/14 [id number]  Gilda Crease, MD 04/25/14 [phone]

## 2014-04-25 NOTE — Discharge Instructions (Signed)
Chest Pain (Nonspecific) °It is often hard to give a specific diagnosis for the cause of chest pain. There is always a chance that your pain could be related to something serious, such as a heart attack or a blood clot in the lungs. You need to follow up with your health care provider for further evaluation. °CAUSES  °· Heartburn. °· Pneumonia or bronchitis. °· Anxiety or stress. °· Inflammation around your heart (pericarditis) or lung (pleuritis or pleurisy). °· A blood clot in the lung. °· A collapsed lung (pneumothorax). It can develop suddenly on its own (spontaneous pneumothorax) or from trauma to the chest. °· Shingles infection (herpes zoster virus). °The chest wall is composed of bones, muscles, and cartilage. Any of these can be the source of the pain. °· The bones can be bruised by injury. °· The muscles or cartilage can be strained by coughing or overwork. °· The cartilage can be affected by inflammation and become sore (costochondritis). °DIAGNOSIS  °Lab tests or other studies may be needed to find the cause of your pain. Your health care provider may have you take a test called an ambulatory electrocardiogram (ECG). An ECG records your heartbeat patterns over a 24-hour period. You may also have other tests, such as: °· Transthoracic echocardiogram (TTE). During echocardiography, sound waves are used to evaluate how blood flows through your heart. °· Transesophageal echocardiogram (TEE). °· Cardiac monitoring. This allows your health care provider to monitor your heart rate and rhythm in real time. °· Holter monitor. This is a portable device that records your heartbeat and can help diagnose heart arrhythmias. It allows your health care provider to track your heart activity for several days, if needed. °· Stress tests by exercise or by giving medicine that makes the heart beat faster. °TREATMENT  °· Treatment depends on what may be causing your chest pain. Treatment may include: °¨ Acid blockers for  heartburn. °¨ Anti-inflammatory medicine. °¨ Pain medicine for inflammatory conditions. °¨ Antibiotics if an infection is present. °· You may be advised to change lifestyle habits. This includes stopping smoking and avoiding alcohol, caffeine, and chocolate. °· You may be advised to keep your head raised (elevated) when sleeping. This reduces the chance of acid going backward from your stomach into your esophagus. °Most of the time, nonspecific chest pain will improve within 2-3 days with rest and mild pain medicine.  °HOME CARE INSTRUCTIONS  °· If antibiotics were prescribed, take them as directed. Finish them even if you start to feel better. °· For the next few days, avoid physical activities that bring on chest pain. Continue physical activities as directed. °· Do not use any tobacco products, including cigarettes, chewing tobacco, or electronic cigarettes. °· Avoid drinking alcohol. °· Only take medicine as directed by your health care provider. °· Follow your health care provider's suggestions for further testing if your chest pain does not go away. °· Keep any follow-up appointments you made. If you do not go to an appointment, you could develop lasting (chronic) problems with pain. If there is any problem keeping an appointment, call to reschedule. °SEEK MEDICAL CARE IF:  °· Your chest pain does not go away, even after treatment. °· You have a rash with blisters on your chest. °· You have a fever. °SEEK IMMEDIATE MEDICAL CARE IF:  °· You have increased chest pain or pain that spreads to your arm, neck, jaw, back, or abdomen. °· You have shortness of breath. °· You have an increasing cough, or you cough   up blood.  You have severe back or abdominal pain.  You feel nauseous or vomit.  You have severe weakness.  You faint.  You have chills. This is an emergency. Do not wait to see if the pain will go away. Get medical help at once. Call your local emergency services (911 in [place] Do not drive  yourself to the hospital. MAKE SURE YOU:   Understand these instructions.  Will watch your condition.  Will get help right away if you are not doing well or get worse. Document Released: 05/23/2005 Document Revised: 08/18/2013 Document Reviewed: 03/18/2008 [facility] Patient Information 2015 [place], [place]. This information is not intended to replace advice given to you by your health care provider. Make sure you discuss any questions you have with your health care provider.  Folliculitis  Folliculitis is redness, soreness, and swelling (inflammation) of the hair follicles. This condition can occur anywhere on the body. People with weakened immune systems, diabetes, or obesity have a greater risk of getting folliculitis. CAUSES  Bacterial infection. This is the most common cause.  Fungal infection.  Viral infection.  Contact with certain chemicals, especially oils and tars. Long-term folliculitis can result from bacteria that live in the nostrils. The bacteria may trigger multiple outbreaks of folliculitis over time. SYMPTOMS Folliculitis most commonly occurs on the scalp, thighs, legs, back, buttocks, and areas where hair is shaved frequently. An early sign of folliculitis is a small, white or yellow, pus-filled, itchy lesion (pustule). These lesions appear on a red, inflamed follicle. They are usually less than 0.2 inches (5 mm) wide. When there is an infection of the follicle that goes deeper, it becomes a boil or furuncle. A group of closely packed boils creates a larger lesion (carbuncle). Carbuncles tend to occur in hairy, sweaty areas of the body. DIAGNOSIS  Your caregiver can usually tell what is wrong by doing a physical exam. A sample may be taken from one of the lesions and tested in a lab. This can help determine what is causing your folliculitis. TREATMENT  Treatment may include:  Applying warm compresses to the affected areas.  Taking antibiotic medicines orally or  applying them to the skin.  Draining the lesions if they contain a large amount of pus or fluid.  Laser hair removal for cases of long-lasting folliculitis. This helps to prevent regrowth of the hair. HOME CARE INSTRUCTIONS  Apply warm compresses to the affected areas as directed by your caregiver.  If antibiotics are prescribed, take them as directed. Finish them even if you start to feel better.  You may take over-the-counter medicines to relieve itching.  Do not shave irritated skin.  Follow up with your caregiver as directed. SEEK IMMEDIATE MEDICAL CARE IF:   You have increasing redness, swelling, or pain in the affected area.  You have a fever. MAKE SURE YOU:  Understand these instructions.  Will watch your condition.  Will get help right away if you are not doing well or get worse. Document Released: 10/22/2001 Document Revised: 02/12/2012 Document Reviewed: 11/13/2011 [facility] Patient Information 2015 [place], [place]. This information is not intended to replace advice given to you by your health care provider. Make sure you discuss any questions you have with your health care provider.  General Headache Without Cause A headache is pain or discomfort felt around the head or neck area. The specific cause of a headache may not be found. There are many causes and types of headaches. A few common ones are:  Tension headaches.  Migraine headaches.  Cluster headaches.  Chronic daily headaches. HOME CARE INSTRUCTIONS   Keep all follow-up appointments with your caregiver or any specialist referral.  Only take over-the-counter or prescription medicines for pain or discomfort as directed by your caregiver.  Lie down in a dark, quiet room when you have a headache.  Keep a headache journal to find out what may trigger your migraine headaches. For example, write down:  What you eat and drink.  How much sleep you get.  Any change to your diet or medicines.  Try  massage or other relaxation techniques.  Put ice packs or heat on the head and neck. Use these 3 to 4 times per day for 15 to 20 minutes each time, or as needed.  Limit stress.  Sit up straight, and do not tense your muscles.  Quit smoking if you smoke.  Limit alcohol use.  Decrease the amount of caffeine you drink, or stop drinking caffeine.  Eat and sleep on a regular schedule.  Get 7 to 9 hours of sleep, or as recommended by your caregiver.  Keep lights dim if bright lights bother you and make your headaches worse. SEEK MEDICAL CARE IF:   You have problems with the medicines you were prescribed.  Your medicines are not working.  You have a change from the usual headache.  You have nausea or vomiting. SEEK IMMEDIATE MEDICAL CARE IF:   Your headache becomes severe.  You have a fever.  You have a stiff neck.  You have loss of vision.  You have muscular weakness or loss of muscle control.  You start losing your balance or have trouble walking.  You feel faint or pass out.  You have severe symptoms that are different from your first symptoms. MAKE SURE YOU:   Understand these instructions.  Will watch your condition.  Will get help right away if you are not doing well or get worse. Document Released: 08/13/2005 Document Revised: 11/05/2011 Document Reviewed: 08/29/2011 [facility] Patient Information 2015 [place], [place]. This information is not intended to replace advice given to you by your health care provider. Make sure you discuss any questions you have with your health care provider.  Hypertension Hypertension, commonly called high blood pressure, is when the force of blood pumping through your arteries is too strong. Your arteries are the blood vessels that carry blood from your heart throughout your body. A blood pressure reading consists of a higher number over a lower number, such as 110/72. The higher number (systolic) is the pressure inside your arteries  when your heart pumps. The lower number (diastolic) is the pressure inside your arteries when your heart relaxes. Ideally you want your blood pressure below 120/80. Hypertension forces your heart to work harder to pump blood. Your arteries may become narrow or stiff. Having hypertension puts you at risk for heart disease, stroke, and other problems.  RISK FACTORS Some risk factors for high blood pressure are controllable. Others are not.  Risk factors you cannot control include:   Race. You may be at higher risk if you are African American.  Age. Risk increases with age.  Gender. Men are at higher risk than women before age 65 years. After age 77, women are at higher risk than men. Risk factors you can control include:  Not getting enough exercise or physical activity.  Being overweight.  Getting too much fat, sugar, calories, or salt in your diet.  Drinking too much alcohol. SIGNS AND SYMPTOMS Hypertension does not usually  cause signs or symptoms. Extremely high blood pressure (hypertensive crisis) may cause headache, anxiety, shortness of breath, and nosebleed. DIAGNOSIS  To check if you have hypertension, your health care provider will measure your blood pressure while you are seated, with your arm held at the level of your heart. It should be measured at least twice using the same arm. Certain conditions can cause a difference in blood pressure between your right and left arms. A blood pressure reading that is higher than normal on one occasion does not mean that you need treatment. If one blood pressure reading is high, ask your health care provider about having it checked again. TREATMENT  Treating high blood pressure includes making lifestyle changes and possibly taking medicine. Living a healthy lifestyle can help lower high blood pressure. You may need to change some of your habits. Lifestyle changes may include:  Following the DASH diet. This diet is high in fruits, vegetables,  and whole grains. It is low in salt, red meat, and added sugars.  Getting at least 2 hours of brisk physical activity every week.  Losing weight if necessary.  Not smoking.  Limiting alcoholic beverages.  Learning ways to reduce stress. If lifestyle changes are not enough to get your blood pressure under control, your health care provider may prescribe medicine. You may need to take more than one. Work closely with your health care provider to understand the risks and benefits. HOME CARE INSTRUCTIONS  Have your blood pressure rechecked as directed by your health care provider.   Take medicines only as directed by your health care provider. Follow the directions carefully. Blood pressure medicines must be taken as prescribed. The medicine does not work as well when you skip doses. Skipping doses also puts you at risk for problems.   Do not smoke.   Monitor your blood pressure at home as directed by your health care provider. SEEK MEDICAL CARE IF:   You think you are having a reaction to medicines taken.  You have recurrent headaches or feel dizzy.  You have swelling in your ankles.  You have trouble with your vision. SEEK IMMEDIATE MEDICAL CARE IF:  You develop a severe headache or confusion.  You have unusual weakness, numbness, or feel faint.  You have severe chest or abdominal pain.  You vomit repeatedly.  You have trouble breathing. MAKE SURE YOU:   Understand these instructions.  Will watch your condition.  Will get help right away if you are not doing well or get worse. Document Released: 08/13/2005 Document Revised: 12/28/2013 Document Reviewed: 06/05/2013 [facility] Patient Information 2015 [place], [place]. This information is not intended to replace advice given to you by your health care provider. Make sure you discuss any questions you have with your health care provider.  Palpitations A palpitation is the feeling that your heartbeat is irregular or  is faster than normal. It may feel like your heart is fluttering or skipping a beat. Palpitations are usually not a serious problem. However, in some cases, you may need further medical evaluation. CAUSES  Palpitations can be caused by:  Smoking.  Caffeine or other stimulants, such as diet pills or energy drinks.  Alcohol.  Stress and anxiety.  Strenuous physical activity.  Fatigue.  Certain medicines.  Heart disease, especially if you have a history of irregular heart rhythms (arrhythmias), such as atrial fibrillation, atrial flutter, or supraventricular tachycardia.  An improperly working pacemaker or defibrillator. DIAGNOSIS  To find the cause of your palpitations, your health care  provider will take your medical history and perform a physical exam. Your health care provider may also have you take a test called an ambulatory electrocardiogram (ECG). An ECG records your heartbeat patterns over a 24-hour period. You may also have other tests, such as:  Transthoracic echocardiogram (TTE). During echocardiography, sound waves are used to evaluate how blood flows through your heart.  Transesophageal echocardiogram (TEE).  Cardiac monitoring. This allows your health care provider to monitor your heart rate and rhythm in real time.  Holter monitor. This is a portable device that records your heartbeat and can help diagnose heart arrhythmias. It allows your health care provider to track your heart activity for several days, if needed.  Stress tests by exercise or by giving medicine that makes the heart beat faster. TREATMENT  Treatment of palpitations depends on the cause of your symptoms and can vary greatly. Most cases of palpitations do not require any treatment other than time, relaxation, and monitoring your symptoms. Other causes, such as atrial fibrillation, atrial flutter, or supraventricular tachycardia, usually require further treatment. HOME CARE INSTRUCTIONS    Avoid:  Caffeinated coffee, tea, soft drinks, diet pills, and energy drinks.  Chocolate.  Alcohol.  Stop smoking if you smoke.  Reduce your stress and anxiety. Things that can help you relax include:  A method of controlling things in your body, such as your heartbeats, with your mind (biofeedback).  Yoga.  Meditation.  Physical activity such as swimming, jogging, or walking.  Get plenty of rest and sleep. SEEK MEDICAL CARE IF:   You continue to have a fast or irregular heartbeat beyond 24 hours.  Your palpitations occur more often. SEEK IMMEDIATE MEDICAL CARE IF:  You have chest pain or shortness of breath.  You have a severe headache.  You feel dizzy or you faint. MAKE SURE YOU:  Understand these instructions.  Will watch your condition.  Will get help right away if you are not doing well or get worse. Document Released: 08/10/2000 Document Revised: 08/18/2013 Document Reviewed: 10/12/2011 [facility] Patient Information 2015 [place], [place]. This information is not intended to replace advice given to you by your health care provider. Make sure you discuss any questions you have with your health care provider.   Emergency Department Resource Guide 1) Find a Doctor and Pay Out of Pocket Although you won't have to find out who is covered by your insurance plan, it is a good idea to ask around and get recommendations. You will then need to call the office and see if the doctor you have chosen will accept you as a new patient and what types of options they offer for patients who are self-pay. Some doctors offer discounts or will set up payment plans for their patients who do not have insurance, but you will need to ask so you aren't surprised when you get to your appointment.  2) Contact Your Local Health Department Not all health departments have doctors that can see patients for sick visits, but many do, so it is worth a call to see if yours does. If you don't know  where your local health department is, you can check in your phone book. The CDC also has a tool to help you locate your state's health department, and many state websites also have listings of all of their local health departments.  3) Find a Walk-in Clinic If your illness is not likely to be very severe or complicated, you may want to try a walk in clinic. These  are popping up all over the country in pharmacies, drugstores, and shopping centers. They're usually staffed by nurse practitioners or physician assistants that have been trained to treat common illnesses and complaints. They're usually fairly quick and inexpensive. However, if you have serious medical issues or chronic medical problems, these are probably not your best option.  No Primary Care Doctor: - Call Health Connect at  [phone] - they can help you locate a primary care doctor that  accepts your insurance, provides certain services, etc. - Physician Referral Service- [phone]  Chronic Pain Problems: Organization         Address  Phone   Notes  [facility]  [phone] Patients need to be referred by their primary care doctor.   Medication Assistance: Organization         Address  Phone   Notes  [facility] [address] [place] [place], [place] [address] [phone] --Must be a resident of [facility] -- Must have NO insurance coverage whatsoever (no Medicaid/ Medicare, etc.) -- The pt. MUST have a primary care doctor that directs their care regularly and follows them in the community   [facility]  [phone]   [employer]  [phone]    Agencies that provide inexpensive medical care: Organization         Address  Phone   Notes  [facility]  [phone]   [facility]    [phone]   [facility] [address] [place], [place] [address] [phone]   [facility]  [address], [place] [phone]   [facility]    [phone]   [facility]    [phone]   [facility]  [address], [place] Phone:  [phone], Fax:  [phone] Hours of Operation:  9 am - 6 pm, M-F.  Also accepts Medicaid/Medicare and self-pay.  [facility]  [address], [place] Phone: [phone], Fax: [phone]. Hours of Operation:  8:30 am - 5:30 pm, M-F.  Also accepts Medicaid and self-pay.  [facility] [place] [address] [place] Phone: [phone]   [facility] [address] Natasha Bence [place], [place] [phone], Ext. [phone] Mondays & Thursdays: 7-9 AM.  First 15 patients are seen on a first come, first serve basis.    Medicaid-accepting [facility] Providers:  Organization         Address  Phone   Notes  [facility] [address], [place] [phone] Also accepts self-pay patients.  [facility] [address] Laurell Josephs [place], [place]  [phone]   [facility] [address], [place] [phone]   [facility] [address], [place] [phone]   Renaye Rakers [address], [place]   [phone] Only accepts [place] patients after they have their name applied to their card.   Self-Pay (no insurance) in [facility]:  Organization         Address  Phone   Notes  Sickle Cell Patients, [facility] [address] [place], [place] [phone]   [facility] [address] [place], [place] [phone]   [facility] [place]  [address], [place] [phone]   [facility]/Dr. Osei-Bonsu  [address], [place] or [address], [place] [phone] Phone number for both  [place] and [place] locations is the same.  [facility] [address] [place] [phone]   [facility] [address] [place] or [address] [phone]   [facility] [address], [place] [phone], phone; [phone], fax Sees patients 1st and 3rd Saturday of every month.  Must not qualify for public or private insurance (i.e. Medicaid, Medicare, [place] Health Choice, Veterans' Benefits)  Household income should be no more than 200% of the poverty level The clinic cannot treat you if you are pregnant or think you are pregnant  Sexually transmitted diseases are not treated at the clinic.    Dental Care: Organization         Address  Phone  Notes  [facility] Department of [facility] [address] [place], [place] [phone] Accepts children up to age 96 who are enrolled in [place] or [place] Health Choice; pregnant women with a Medicaid card; and children who have applied for Medicaid or [place] Health Choice, but were declined, whose parents can pay a reduced fee at time of service.  [facility] Department of [facility]  [address], [place] [phone] Accepts children up to age 32 who are enrolled in [place] or [place] Health Choice; pregnant women with a Medicaid card; and children who have applied for Medicaid or  Health Choice, but were declined, whose parents can pay a reduced fee at time of service.  [facility]  [address] [place], [place] [phone] Patients are seen by appointment only. Walk-ins are not accepted. [facility] will see patients 78 years of age and older. Monday - Tuesday (8am-5pm) Most Wednesdays (8:30-5pm) $30 per visit, cash only  [facility]  [address], [facility] [phone] Patients are seen by appointment only. Walk-ins are not  accepted. [facility] will see patients 25 years of age and older. One Wednesday Evening (Monthly: Volunteer Based).  $30 per visit, cash only  [employer] [facility] [employer]  [phone] for adults; Children under age 48, call [facility] at [phone]. Children aged 5-14, please call [phone] to request a pediatric application.  Dental services are provided in all areas of dental care including fillings, crowns and bridges, complete and partial dentures, implants, gum treatment, root canals, and extractions. Preventive care is also provided. Treatment is provided to both adults and children. Patients are selected via a lottery and there is often a waiting list.   [facility] [address], [place]  [phone] [email or web address]   [facility] [address] [place], [place] [phone], Ext. [phone] Second and Fourth Thursday of each month, opens at 6:30 AM; Clinic ends at 9 AM.  Patients are seen on a first-come first-served basis, and a limited number are seen during each clinic.   [facility]  [address] [place], [place] [phone]   Eligibility Requirements You must have lived in [place], [place], or [place] counties for at least the last three months.   You cannot be eligible for state or federal sponsored [place], including [employer], [place],  or Medicare.   You generally cannot be eligible for healthcare insurance through your employer.    How to apply: Eligibility screenings are held every Tuesday and Wednesday afternoon from 1:00 pm until 4:00 pm. You do not need an appointment for the interview!  [facility] [address], [place], [place] [phone]   [facility] Health Department  [phone]   [facility] Health Department  [phone]   [facility] Health Department  [phone]    Behavioral Health Resources in the  Community: Intensive Outpatient Programs Organization         Address  Phone  Notes  [facility] [address] [place], [place] [phone]   [facility] Outpatient [address], [place], [place] [phone]   [facility] [address], [place], [place]  [phone]   [facility] Mental Health [address],  [place], [place] [phone] or [phone]   Substance Abuse Resources Organization         Address  Phone  Notes  [facility]  [phone]   [facility]  [phone]   [facility]  [phone]   Floydene Flock  [phone]   Residential & Outpatient Substance Abuse Program  [phone]   Psychological Services Organization         Address  Phone  Notes  [facility] Behavioral Health  [phone]   [facility] Services  [phone]   [facility] Mental Health [address] [place] [phone] or [phone]    Mobile Crisis Teams Organization         Address  Phone  Notes  [facility], Mobile Crisis Care Unit  [phone]   [facility]  [address] [place], [place] [phone]   Doristine Locks [address] [place] [phone]    Self-Help/Support Groups Organization         Address  Phone             Notes  [facility] [place] - variety of support groups  [phone]- [phone] Call for more information  Narcotics Anonymous (NA), [facility] [address], [employer] [place]  2 meetings at this location   Statistician         Address  Phone  Notes  [facility] [address],    [place]  [phone]   [facility]  [address] [place] [address], [place], [place] [phone]   [facility] [address] [place], [place] [phone] Admissions: 8am-3pm M-F  [facility] [address],    [place], [place] [phone]   [facility] [address] [place], [place], [place] [phone]   [facility] [address],  [place], [place] [phone]   [facility] - Intensive Outpatient [address], [place], [place] [phone]   [facility] ([facility] [address] [place]  [place], [place] [phone] or [phone]   [facility] ([facility] [address] [place], [place] [phone] Accepts Medicaid  [facility] [address],  [place] [phone] Substance Abuse/Addiction Treatment   [facility] Organization         Address  Phone  Notes  [facility]  [phone]   Angie Fava, PhD [address] [place], [place]   [phone] or [phone]   Patrcia Dolly  [place], [place] [phone]   [facility] [address], [place], [place] [phone] Insurance/Medicaid/sponsorship through [facility] and Families [address], [address] [place], [place] [phone] [place], [place] [phone]    Dr. Adele Schilder  [phone]   [facility] Dept. 1) [address], [place] 2) [place] 3)  [place] [address], [place] [phone]  [phone]   [place] [phone] or [phone] (After Hours)

## 2014-04-25 NOTE — ED Notes (Addendum)
Pt reports having lupus but unable to go to dr lately since medicare ran out, having right side headaches and neck pain. Having mid and left side chest pains x 2 days and generalized fatigue. Also having lower abd pain that she thinks may be ovarian cyst.

## 2014-06-28 ENCOUNTER — Encounter (HOSPITAL_COMMUNITY): Payer: Self-pay | Admitting: Emergency Medicine

## 2014-12-07 ENCOUNTER — Telehealth: Payer: Self-pay | Admitting: Nurse Practitioner

## 2014-12-07 DIAGNOSIS — B349 Viral infection, unspecified: Secondary | ICD-10-CM

## 2014-12-07 NOTE — Progress Notes (Signed)
E visit for Flu like symptoms   We are sorry that you are not feeling well.  Here is how we plan to help! Based on what you have shared with me it looks like you may have flu-like symptoms that should be watched but do not seem to indicate anti-viral treatment.  Influenza or "the flu" is   an infection caused by a respiratory virus. The flu virus is highly contagious and persons who did not receive their yearly flu vaccination may "catch" the flu from close contact.  We have anti-viral medications to treat the viruses that cause this infection. They are not a "cure" and only shorten the course of the infection. These prescriptions are most effective when they are given within the first 2 days of "flu" symptoms. Antiviral medication are indicated if you have a high risk of complications from the flu. You should  also consider an antiviral medication if you are in close contact with someone who is at risk. These medications can help patients avoid complications from the flu  but have side effects that you should know. Possible side effects from Tamiflu or oseltamivir include nausea, vomiting, diarrhea, dizziness, headaches, eye redness, sleep problems or other respiratory symptoms. You should not take Tamiflu if you have an allergy to oseltamivir or any to the ingredients in Tamiflu.  Based upon your symptoms and potential risk factors I recommend that you follow the flu symptoms recommendation that I have listed below.  ANYONE WHO HAS FLU SYMPTOMS SHOULD: . Stay home. The flu is highly contagious and going out or to work exposes others! . Be sure to drink plenty of fluids. Water is fine as well as fruit juices, sodas and electrolyte beverages. You may want to stay away from caffeine or alcohol. If you are nauseated, try taking small sips of liquids. How do you know if you are getting enough fluid? Your urine should be a pale yellow or almost colorless. . Get rest. . Taking a steamy shower or using a  humidifier may help nasal congestion and ease sore throat pain. Using a saline nasal spray works much the same way. . Cough drops, hard candies and sore throat lozenges may ease your cough. . Line up a caregiver. Have someone check on you regularly.   GET HELP RIGHT AWAY IF: . You cannot keep down liquids or your medications. . You become short of breath . Your fell like you are going to pass out or loose consciousness. . Your symptoms persist after you have completed your treatment plan MAKE SURE YOU   Understand these instructions.  Will watch your condition.  Will get help right away if you are not doing well or get worse.  Your e-visit answers were reviewed by a board certified advanced clinical practitioner to complete your personal care plan.  Depending on the condition, your plan could have included both over the counter or prescription medications.  If there is a problem please reply  once you have received a response from your provider.  Your safety is important to us.  If you have drug allergies check your prescription carefully.    You can use MyChart to ask questions about today's visit, request a non-urgent call back, or ask for a work or school excuse.  You will get an e-mail in the next two days asking about your experience.  I hope that your e-visit has been valuable and will speed your recovery. Thank you for using e-visits.   

## 2014-12-20 ENCOUNTER — Encounter (HOSPITAL_COMMUNITY): Payer: Self-pay | Admitting: Family Medicine

## 2014-12-20 ENCOUNTER — Emergency Department (HOSPITAL_COMMUNITY)
Admission: EM | Admit: 2014-12-20 | Discharge: 2014-12-20 | Disposition: A | Discharge status: Home / Self Care | Payer: Self-pay | Attending: Emergency Medicine | Admitting: Emergency Medicine

## 2014-12-20 DIAGNOSIS — J45909 Unspecified asthma, uncomplicated: Secondary | ICD-10-CM | POA: Insufficient documentation

## 2014-12-20 DIAGNOSIS — Z862 Personal history of diseases of the blood and blood-forming organs and certain disorders involving the immune mechanism: Secondary | ICD-10-CM | POA: Insufficient documentation

## 2014-12-20 DIAGNOSIS — Z8619 Personal history of other infectious and parasitic diseases: Secondary | ICD-10-CM | POA: Insufficient documentation

## 2014-12-20 DIAGNOSIS — I10 Essential (primary) hypertension: Secondary | ICD-10-CM | POA: Insufficient documentation

## 2014-12-20 DIAGNOSIS — N730 Acute parametritis and pelvic cellulitis: Secondary | ICD-10-CM | POA: Insufficient documentation

## 2014-12-20 DIAGNOSIS — Z3202 Encounter for pregnancy test, result negative: Secondary | ICD-10-CM | POA: Insufficient documentation

## 2014-12-20 DIAGNOSIS — Z720 Tobacco use: Secondary | ICD-10-CM | POA: Insufficient documentation

## 2014-12-20 DIAGNOSIS — R102 Pelvic and perineal pain: Secondary | ICD-10-CM

## 2014-12-20 DIAGNOSIS — Z8719 Personal history of other diseases of the digestive system: Secondary | ICD-10-CM | POA: Insufficient documentation

## 2014-12-20 DIAGNOSIS — Z8673 Personal history of transient ischemic attack (TIA), and cerebral infarction without residual deficits: Secondary | ICD-10-CM | POA: Insufficient documentation

## 2014-12-20 DIAGNOSIS — Z880 Allergy status to penicillin: Secondary | ICD-10-CM | POA: Insufficient documentation

## 2014-12-20 DIAGNOSIS — Z79899 Other long term (current) drug therapy: Secondary | ICD-10-CM | POA: Insufficient documentation

## 2014-12-20 DIAGNOSIS — Z7982 Long term (current) use of aspirin: Secondary | ICD-10-CM | POA: Insufficient documentation

## 2014-12-20 DIAGNOSIS — Z8669 Personal history of other diseases of the nervous system and sense organs: Secondary | ICD-10-CM | POA: Insufficient documentation

## 2014-12-20 DIAGNOSIS — E119 Type 2 diabetes mellitus without complications: Secondary | ICD-10-CM | POA: Insufficient documentation

## 2014-12-20 DIAGNOSIS — M069 Rheumatoid arthritis, unspecified: Secondary | ICD-10-CM | POA: Insufficient documentation

## 2014-12-20 LAB — I-STAT TROPONIN, ED: Troponin i, poc: 0 ng/mL (ref 0.00–0.08)

## 2014-12-20 LAB — URINALYSIS, ROUTINE W REFLEX MICROSCOPIC
Bilirubin Urine: NEGATIVE
Glucose, UA: NEGATIVE mg/dL
Hgb urine dipstick: NEGATIVE
Ketones, ur: NEGATIVE mg/dL
Nitrite: NEGATIVE
Protein, ur: NEGATIVE mg/dL
Specific Gravity, Urine: 1.026 (ref 1.005–1.030)
Urobilinogen, UA: 0.2 mg/dL (ref 0.0–1.0)
pH: 6 (ref 5.0–8.0)

## 2014-12-20 LAB — BASIC METABOLIC PANEL
Anion gap: 11 (ref 5–15)
BUN: 10 mg/dL (ref 6–23)
CO2: 24 mmol/L (ref 19–32)
Calcium: 9.3 mg/dL (ref 8.4–10.5)
Chloride: 104 mmol/L (ref 96–112)
Creatinine, Ser: 0.68 mg/dL (ref 0.50–1.10)
GFR calc Af Amer: >90 mL/min (ref >90)
GFR calc non Af Amer: >90 mL/min (ref >90)
Glucose, Bld: 136 mg/dL — ABNORMAL HIGH (ref 70–99)
Potassium: 3.6 mmol/L (ref 3.5–5.1)
Sodium: 139 mmol/L (ref 135–145)

## 2014-12-20 LAB — URINE MICROSCOPIC-ADD ON

## 2014-12-20 LAB — WET PREP, GENITAL
Trich, Wet Prep: NONE SEEN
Yeast Wet Prep HPF POC: NONE SEEN

## 2014-12-20 LAB — POC URINE PREG, ED: Preg Test, Ur: NEGATIVE

## 2014-12-20 LAB — CBC
HCT: 38.5 % (ref 36.0–46.0)
Hemoglobin: 12.8 g/dL (ref 12.0–15.0)
MCH: 28.6 pg (ref 26.0–34.0)
MCHC: 33.2 g/dL (ref 30.0–36.0)
MCV: 85.9 fL (ref 78.0–100.0)
Platelets: 238 10*3/uL (ref 150–400)
RBC: 4.48 MIL/uL (ref 3.87–5.11)
RDW: 14 % (ref 11.5–15.5)
WBC: 7.9 10*3/uL (ref 4.0–10.5)

## 2014-12-20 LAB — LIPASE, BLOOD: Lipase: 42 U/L (ref 11–59)

## 2014-12-20 MED ORDER — DOXYCYCLINE HYCLATE 100 MG PO CAPS: 100.0000 mg | ORAL_CAPSULE | Freq: Two times a day (BID) | ORAL | Status: DC

## 2014-12-20 MED ORDER — AZITHROMYCIN 250 MG PO TABS
1000.0000 mg | ORAL_TABLET | Freq: Once | ORAL | Status: AC
  Administered 2014-12-20: 1000 mg via ORAL
  Filled 2014-12-20: qty 4

## 2014-12-20 MED ORDER — LIDOCAINE HCL (PF) 1 % IJ SOLN
INTRAMUSCULAR | Status: AC
  Administered 2014-12-20: 5 mL
  Filled 2014-12-20: qty 5

## 2014-12-20 MED ORDER — CEFTRIAXONE SODIUM 250 MG IJ SOLR
250.0000 mg | Freq: Once | INTRAMUSCULAR | Status: AC
  Administered 2014-12-20: 250 mg via INTRAMUSCULAR
  Filled 2014-12-20: qty 250

## 2014-12-20 MED ORDER — SODIUM CHLORIDE 0.9 % IV BOLUS (SEPSIS)
1000.0000 mL | Freq: Once | INTRAVENOUS | Status: AC
  Administered 2014-12-20: 1000 mL via INTRAVENOUS

## 2014-12-20 MED ORDER — MORPHINE SULFATE 4 MG/ML IJ SOLN
6.0000 mg | Freq: Once | INTRAMUSCULAR | Status: AC
  Administered 2014-12-20: 6 mg via INTRAVENOUS
  Filled 2014-12-20: qty 2

## 2014-12-20 MED ORDER — ONDANSETRON HCL 4 MG/2ML IJ SOLN
4.0000 mg | Freq: Once | INTRAMUSCULAR | Status: AC
  Administered 2014-12-20: 4 mg via INTRAVENOUS
  Filled 2014-12-20: qty 2

## 2014-12-20 NOTE — ED Notes (Signed)
Spoke with patient regarding discharge instructions. Verbalized understanding and was concerned of additional medical history and wanted to speak with the provider again.  Provider at bedside.

## 2014-12-20 NOTE — ED Notes (Signed)
Pt here for generalized fatigue, vaginal bleeding, back pain, chest pain on the right and left side, sts swelling in right and left leg. sts some RUQ pain and shoulder pain.

## 2014-12-20 NOTE — ED Provider Notes (Signed)
CSN: [id number]     Arrival date & time 12/20/14  1322 History   First MD Initiated Contact with Patient 12/20/14 1745     Chief Complaint  Patient presents with  . Fatigue  . Chest Pain     (Consider location/radiation/quality/duration/timing/severity/associated sxs/prior Treatment) HPI   34 year old female with history of non-insulin dependent diabetes, hypertension, rheumatoid arthritis, prior STD who presents with multiple complaints. Patient reports generalized fatigue ongoing for the past month. States that she feels so weak that she cannot take care of her kids. Furthermore she also complaining of intermittent pain to her right side of face behind her right eye usually lasting for several hours. Does report history of migraine. She also endorsed intermittent episodes of bilateral blurry vision lasting for several hours. For the past week she noticed intermittent left-sided chest discomfort which she described as a sharp sensation lasting for a few seconds, nonexertional without any evidence of shortness of breath. Reports increased frequency of chest discomfort. She also endorsed low abdominal cramping with a bloating sensation and decreased appetite. She endorses mild nausea without vomiting or diarrhea. She denies having any fever, neck stiffness, URI symptoms, dysuria, or rash. She did report one week episodes of prolonged menstruation without heavy bleeding which and approximately 2 weeks ago. She does complain of pain with sexual activities, and having increased vaginal discharge without any strong odor. Does report prior history of STD. She is sexually active with her husband only. Patient does not have a primary care provider. She denies any specific treatment tried. She reported prior cholecystectomy.  Past Medical History  Diagnosis Date  . Diabetes mellitus     A2DM during pregnancy. now on metformin.   [name] Hypertension   . Gonorrhea 2010    treated  . Chlamydia 2012    treated   . Pregnancy induced hypertension     pre E with pregnancies  . Asthma   . Rheumatoid arthritis(714.0)   . 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 SVT (supraventricular tachycardia)   . Complication of anesthesia     " they have trouble waking me up"  . Family history of anesthesia complication     mother & daughter   . Shortness of breath   . Sleep apnea     does not use cpap  . GERD (gastroesophageal reflux disease)   . Anemia   . Lupus 10-2013   Past Surgical History  Procedure Laterality Date  . Tubal ligation  2009  . Finger surgery  1993    left ring finger to correct bone "problem"  . Cholecystectomy  07/22/2011    Procedure: LAPAROSCOPIC CHOLECYSTECTOMY WITH INTRAOPERATIVE CHOLANGIOGRAM;  Surgeon: Kandis Cocking, MD;  Location: [facility] ORS;  Service: General;  Laterality: N/A;  . Ercp  07/24/2011    Procedure: ENDOSCOPIC RETROGRADE CHOLANGIOPANCREATOGRAPHY (ERCP);  Surgeon: Theda Belfast;  Location: [facility] ENDOSCOPY;  Service: Endoscopy;  Laterality: N/A;   Family History  Problem Relation Age of Onset  . Anesthesia problems Mother     difficult to arouse, SOB  . Diabetes Mother   . Hypertension Mother   . Stroke Mother   . Diabetes Father   . Hypertension Father   . Heart disease Father   . Cancer Maternal Aunt   . Cancer Maternal Grandmother    History  Substance Use Topics  . Smoking status: Current Every Day Smoker -- 0.25 packs/day for 4 years    Types: Cigarettes  .  Smokeless tobacco: Never Used  . Alcohol Use: Yes     Comment: socially   OB History    Gravida Para Term Preterm AB TAB SAB Ectopic Multiple Living   9 6 0 6 3 0 3 0 0 6      Review of Systems  All other systems reviewed and are negative.     Allergies  Amoxicillin and Dilaudid  Home Medications   Prior to Admission medications   Medication Sig Start Date End Date Taking? Authorizing Provider  albuterol (PROVENTIL HFA;VENTOLIN HFA) 108 (90  BASE) MCG/ACT inhaler Inhale 2 puffs into the lungs every 6 (six) hours as needed. For wheezing   Yes Historical Provider, MD  aspirin EC 81 MG tablet Take 81 mg by mouth daily.   Yes Historical Provider, MD  Cholecalciferol (VITAMIN D PO) Take 1 tablet by mouth daily.   Yes Historical Provider, MD  hydrochlorothiazide (HYDRODIURIL) 25 MG tablet Take 1 tablet (25 mg total) by mouth daily. 04/25/14  Yes 04/27/14, MD  Multiple Vitamins-Minerals (MULTIVITAMIN WITH MINERALS) tablet Take 1 tablet by mouth daily.     Yes Historical Provider, MD  sulfamethoxazole-trimethoprim (SEPTRA DS) 800-160 MG per tablet Take 1 tablet by mouth every 12 (twelve) hours. Patient not taking: Reported on 12/20/2014 04/25/14   04/27/14, MD   BP 142/92 mmHg  Pulse 74  Temp(Src) 98 F (36.7 C) (Oral)  Resp 16  SpO2 100%  LMP 12/05/2014 Physical Exam  Constitutional: She is oriented to person, place, and time. She appears well-developed and well-nourished. No distress.  Morbidly obese African-American female, resting in bed, nontoxic in appearance  HENT:  Head: Atraumatic.  Right Ear: External ear normal.  Left Ear: External ear normal.  Nose: Nose normal.  Mouth/Throat: Oropharynx is clear and moist.  Eyes: Conjunctivae and EOM are normal. Pupils are equal, round, and reactive to light.  Neck: Normal range of motion. Neck supple.  No nuchal rigidity  Cardiovascular: Normal rate and regular rhythm.   Pulmonary/Chest: Effort normal and breath sounds normal. No respiratory distress. She exhibits no tenderness.  Abdominal: Soft. Bowel sounds are normal. She exhibits no distension. There is tenderness (Mild generalized abdominal tenderness on palpation most significant left lower quadrant without guarding or rebound tenderness. No overlying skin changes.).  Genitourinary:  Chaperone present during exam. No inguinal lymphadenopathy or inguinal hernia appreciated. A mildly enlarged Bartholin's  gland appreciated to the right labia majora, mild tenderness to palpation. Normal external genitalia. Closed cervical os with mild dystrophic skin changes at the T zone, nonfriable. No adnexal tenderness but patient does have cervical motion tenderness. However exam is difficult due to large body habitus.  Neurological: She is alert and oriented to person, place, and time. She has normal strength. No cranial nerve deficit or sensory deficit. GCS eye subscore is 4. GCS verbal subscore is 5. GCS motor subscore is 6.  Skin: No rash noted.  Psychiatric: She has a normal mood and affect.  Nursing note and vitals reviewed.   ED Course  Procedures (including critical care time)  6:02 PM Patient is complaining of generalized weakness. She also concerned of vaginal bleeding, having no abdominal pain. Urine shows no evidence of urinary tract infection, but there are many bacteria concerning for possible STD. Patient will need pelvic examination for further evaluation. Her chest pain is atypical for ACS and she does not have any active chest pain this time. Her EKG shows no acute ischemic changes.. Labs otherwise reassuring. No evidence  of anemia or electrolyte imbalance. Mildly elevated glucose at 136 but normal anion gap and no evidence to suggest uncontrolled diabetes.  9:03 PM Wet prep shows many WBC concerning for PID. Patient has received Rocephin and Zithromax in ER. Her pain is controlled. Patient will be discharged with doxycycline for 2 weeks and outpatient follow-up. Return precautions discussed. Patient made aware to notify partner if tested positive for STD. Patient to avoid sexual activities until symptoms resolved. She was understanding and agrees with plan.  Labs Review Labs Reviewed  BASIC METABOLIC PANEL - Abnormal; Notable for the following:    Glucose, Bld 136 (*)    All other components within normal limits  URINALYSIS, ROUTINE W REFLEX MICROSCOPIC - Abnormal; Notable for the  following:    Color, Urine AMBER (*)    Leukocytes, UA TRACE (*)    All other components within normal limits  URINE MICROSCOPIC-ADD ON - Abnormal; Notable for the following:    Squamous Epithelial / LPF FEW (*)    Bacteria, UA MANY (*)    All other components within normal limits  CBC  LIPASE, BLOOD  I-STAT TROPOININ, ED  POC URINE PREG, ED    Imaging Review No results found.   EKG Interpretation None      Date: 12/20/2014  Rate: 100  Rhythm: sinus tach  QRS Axis: normal  Intervals: normal  ST/T Wave abnormalities: normal  Conduction Disutrbances: none  Narrative Interpretation:   Old EKG Reviewed: No significant changes noted     MDM   Final diagnoses:  Pelvic pain in female  PID (acute pelvic inflammatory disease)   BP 142/87 mmHg  Pulse 78  Temp(Src) 98 F (36.7 C) (Oral)  Resp 24  SpO2 100%  LMP 12/05/2014     Fayrene Helper, PA-C 12/20/14 2107  Fayrene Helper, PA-C 12/20/14 3299  Mancel Bale, MD 12/22/14 [phone]

## 2014-12-20 NOTE — Discharge Instructions (Signed)
You have pelvic discomfort on exam along with many bacteria in the pelvic exam concerning for possible pelvic inflammatory disease.  Take antibiotic as prescribed for the full duration.  If you are tested positive for any specific STD you will be notified.  Avoid sexual activities until everything is resolved.    Pelvic Inflammatory Disease Pelvic inflammatory disease (PID) is an infection in some or all of the female organs. PID can be in the uterus, ovaries, fallopian tubes, or the surrounding tissues inside the lower belly area (pelvis). HOME CARE   If given, take your antibiotic medicine as told. Finish them even if you start to feel better.  Only take medicine as told by your doctor.  Do not have sex (intercourse) until treatment is done or as told by your doctor.  Tell your sex partner if you have PID. Your partner may need to be treated.  Keep all doctor visits. GET HELP RIGHT AWAY IF:   You have a fever.  You have more belly (abdominal) or lower belly pain.  You have chills.  You have pain when you pee (urinate).  You are not better after 72 hours.  You have more fluid (discharge) coming from your vagina or fluid that is not normal.  You need pain medicine from your doctor.  You throw up (vomit).  You cannot take your medicines.  Your partner has a sexually transmitted disease (STD). MAKE SURE YOU:   Understand these instructions.  Will watch your condition.  Will get help right away if you are not doing well or get worse. Document Released: 11/09/2008 Document Revised: 12/08/2012 Document Reviewed: 08/09/2011 [facility] Patient Information 2015 [place], [place]. This information is not intended to replace advice given to you by your health care provider. Make sure you discuss any questions you have with your health care provider.

## 2014-12-21 LAB — HIV ANTIBODY (ROUTINE TESTING W REFLEX): HIV Screen 4th Generation wRfx: NONREACTIVE

## 2014-12-21 LAB — GC/CHLAMYDIA PROBE AMP (~~LOC~~) NOT AT ARMC
Chlamydia: NEGATIVE
Neisseria Gonorrhea: NEGATIVE

## 2014-12-21 LAB — RPR: RPR Ser Ql: NONREACTIVE

## 2015-06-08 ENCOUNTER — Emergency Department (HOSPITAL_COMMUNITY)
Admission: EM | Admit: 2015-06-08 | Discharge: 2015-06-09 | Disposition: A | Discharge status: Home / Self Care | Payer: Self-pay | Attending: Emergency Medicine | Admitting: Emergency Medicine

## 2015-06-08 ENCOUNTER — Emergency Department (HOSPITAL_COMMUNITY): Payer: Self-pay

## 2015-06-08 ENCOUNTER — Encounter (HOSPITAL_COMMUNITY): Payer: Self-pay | Admitting: *Deleted

## 2015-06-08 DIAGNOSIS — Z8719 Personal history of other diseases of the digestive system: Secondary | ICD-10-CM | POA: Insufficient documentation

## 2015-06-08 DIAGNOSIS — R519 Headache, unspecified: Secondary | ICD-10-CM

## 2015-06-08 DIAGNOSIS — Z3202 Encounter for pregnancy test, result negative: Secondary | ICD-10-CM | POA: Insufficient documentation

## 2015-06-08 DIAGNOSIS — J069 Acute upper respiratory infection, unspecified: Secondary | ICD-10-CM | POA: Insufficient documentation

## 2015-06-08 DIAGNOSIS — H53149 Visual discomfort, unspecified: Secondary | ICD-10-CM | POA: Insufficient documentation

## 2015-06-08 DIAGNOSIS — E119 Type 2 diabetes mellitus without complications: Secondary | ICD-10-CM | POA: Insufficient documentation

## 2015-06-08 DIAGNOSIS — J45909 Unspecified asthma, uncomplicated: Secondary | ICD-10-CM | POA: Insufficient documentation

## 2015-06-08 DIAGNOSIS — Z720 Tobacco use: Secondary | ICD-10-CM | POA: Insufficient documentation

## 2015-06-08 DIAGNOSIS — R1032 Left lower quadrant pain: Secondary | ICD-10-CM | POA: Insufficient documentation

## 2015-06-08 DIAGNOSIS — Z8673 Personal history of transient ischemic attack (TIA), and cerebral infarction without residual deficits: Secondary | ICD-10-CM | POA: Insufficient documentation

## 2015-06-08 DIAGNOSIS — Z79899 Other long term (current) drug therapy: Secondary | ICD-10-CM | POA: Insufficient documentation

## 2015-06-08 DIAGNOSIS — Z880 Allergy status to penicillin: Secondary | ICD-10-CM | POA: Insufficient documentation

## 2015-06-08 DIAGNOSIS — I10 Essential (primary) hypertension: Secondary | ICD-10-CM | POA: Insufficient documentation

## 2015-06-08 DIAGNOSIS — Z8739 Personal history of other diseases of the musculoskeletal system and connective tissue: Secondary | ICD-10-CM | POA: Insufficient documentation

## 2015-06-08 DIAGNOSIS — H538 Other visual disturbances: Secondary | ICD-10-CM | POA: Insufficient documentation

## 2015-06-08 DIAGNOSIS — Z862 Personal history of diseases of the blood and blood-forming organs and certain disorders involving the immune mechanism: Secondary | ICD-10-CM | POA: Insufficient documentation

## 2015-06-08 DIAGNOSIS — Z9851 Tubal ligation status: Secondary | ICD-10-CM | POA: Insufficient documentation

## 2015-06-08 DIAGNOSIS — Z9049 Acquired absence of other specified parts of digestive tract: Secondary | ICD-10-CM | POA: Insufficient documentation

## 2015-06-08 DIAGNOSIS — R51 Headache: Secondary | ICD-10-CM | POA: Insufficient documentation

## 2015-06-08 LAB — CBC
HCT: 37.9 % (ref 36.0–46.0)
Hemoglobin: 12.7 g/dL (ref 12.0–15.0)
MCH: 29 pg (ref 26.0–34.0)
MCHC: 33.5 g/dL (ref 30.0–36.0)
MCV: 86.5 fL (ref 78.0–100.0)
Platelets: 255 10*3/uL (ref 150–400)
RBC: 4.38 MIL/uL (ref 3.87–5.11)
RDW: 14.8 % (ref 11.5–15.5)
WBC: 11.2 10*3/uL — ABNORMAL HIGH (ref 4.0–10.5)

## 2015-06-08 LAB — I-STAT TROPONIN, ED: Troponin i, poc: 0 ng/mL (ref 0.00–0.08)

## 2015-06-08 NOTE — ED Notes (Signed)
Pt states that she has had a cold for 2 weeks pt states that she has a persistent cough; pt states that she has progressed to feeling short of breath; pt states that it is a NP cough; pt states that she cannot lay down or lay on her side because she feels like she is drowning and cannot catch her breath; pt states that she has had some intermittent left sided chest pain that is not related to cough; pt states that the chest pain is sharp when it comes; pt c/o frontal headache / pressure; pt states "It feels like my head is going to pop off"; pt also c/o no menstrual cycle since July and states that she is having LLQ abd pain and states "I feel something there"

## 2015-06-09 ENCOUNTER — Emergency Department (HOSPITAL_COMMUNITY): Payer: Self-pay

## 2015-06-09 ENCOUNTER — Encounter (HOSPITAL_COMMUNITY): Payer: Self-pay

## 2015-06-09 LAB — URINALYSIS, ROUTINE W REFLEX MICROSCOPIC
Bilirubin Urine: NEGATIVE
Glucose, UA: NEGATIVE mg/dL
Hgb urine dipstick: NEGATIVE
Ketones, ur: NEGATIVE mg/dL
Leukocytes, UA: NEGATIVE
Nitrite: NEGATIVE
Protein, ur: NEGATIVE mg/dL
Specific Gravity, Urine: 1.02 (ref 1.005–1.030)
Urobilinogen, UA: 0.2 mg/dL (ref 0.0–1.0)
pH: 6 (ref 5.0–8.0)

## 2015-06-09 LAB — BASIC METABOLIC PANEL
Anion gap: 5 (ref 5–15)
BUN: 14 mg/dL (ref 6–20)
CO2: 28 mmol/L (ref 22–32)
Calcium: 9.4 mg/dL (ref 8.9–10.3)
Chloride: 105 mmol/L (ref 101–111)
Creatinine, Ser: 0.66 mg/dL (ref 0.44–1.00)
GFR calc Af Amer: >60 mL/min (ref >60)
GFR calc non Af Amer: >60 mL/min (ref >60)
Glucose, Bld: 98 mg/dL (ref 65–99)
Potassium: 4.2 mmol/L (ref 3.5–5.1)
Sodium: 138 mmol/L (ref 135–145)

## 2015-06-09 LAB — LIPASE, BLOOD: Lipase: 28 U/L (ref 22–51)

## 2015-06-09 LAB — PREGNANCY, URINE: Preg Test, Ur: NEGATIVE

## 2015-06-09 MED ORDER — IBUPROFEN 600 MG PO TABS: 600.0000 mg | ORAL_TABLET | Freq: Four times a day (QID) | ORAL | Status: DC | PRN

## 2015-06-09 MED ORDER — METOCLOPRAMIDE HCL 5 MG/ML IJ SOLN
10.0000 mg | Freq: Once | INTRAMUSCULAR | Status: AC
  Administered 2015-06-09: 10 mg via INTRAVENOUS
  Filled 2015-06-09: qty 2

## 2015-06-09 MED ORDER — KETOROLAC TROMETHAMINE 30 MG/ML IJ SOLN
30.0000 mg | Freq: Once | INTRAMUSCULAR | Status: AC
  Administered 2015-06-09: 30 mg via INTRAVENOUS
  Filled 2015-06-09: qty 1

## 2015-06-09 MED ORDER — IPRATROPIUM BROMIDE 0.02 % IN SOLN
0.5000 mg | Freq: Once | RESPIRATORY_TRACT | Status: AC
  Administered 2015-06-09: 0.5 mg via RESPIRATORY_TRACT
  Filled 2015-06-09: qty 2.5

## 2015-06-09 MED ORDER — AZITHROMYCIN 250 MG PO TABS: 250.0000 mg | ORAL_TABLET | Freq: Every day | ORAL | Status: DC

## 2015-06-09 MED ORDER — ALBUTEROL (5 MG/ML) CONTINUOUS INHALATION SOLN
10.0000 mg/h | INHALATION_SOLUTION | Freq: Once | RESPIRATORY_TRACT | Status: AC
  Administered 2015-06-09: 10 mg/h via RESPIRATORY_TRACT
  Filled 2015-06-09: qty 20

## 2015-06-09 MED ORDER — IOHEXOL 300 MG/ML  SOLN
25.0000 mL | Freq: Once | INTRAMUSCULAR | Status: AC | PRN
  Administered 2015-06-09: 25 mL via ORAL

## 2015-06-09 MED ORDER — IOHEXOL 300 MG/ML  SOLN
100.0000 mL | Freq: Once | INTRAMUSCULAR | Status: AC | PRN
  Administered 2015-06-09: 100 mL via INTRAVENOUS

## 2015-06-09 MED ORDER — PREDNISONE 20 MG PO TABS
60.0000 mg | ORAL_TABLET | Freq: Once | ORAL | Status: AC
  Administered 2015-06-09: 60 mg via ORAL
  Filled 2015-06-09: qty 3

## 2015-06-09 MED ORDER — SODIUM CHLORIDE 0.9 % IV BOLUS (SEPSIS)
1000.0000 mL | Freq: Once | INTRAVENOUS | Status: AC
  Administered 2015-06-09: 1000 mL via INTRAVENOUS

## 2015-06-09 MED ORDER — BENZONATATE 100 MG PO CAPS: 100.0000 mg | ORAL_CAPSULE | Freq: Three times a day (TID) | ORAL | Status: DC

## 2015-06-09 NOTE — Discharge Instructions (Signed)
We saw you in the ER for the headache, weakness, cough and congestion. We think what you have is a viral syndrome - the treatment for which is symptomatic relief only, and your body will fight the infection off in a few days. We are prescribing you some meds for pain and fevers. IF YOU START HAVING FEVERS, SYMPTOMS GET WORSE, COUGH PRODUCES GREEN PHLEGM - PLEASE RETURN TO THE ER.  We are not sure what is causing your headaches, however, there appears to be no evidence of infection, bleeds or tumors based on our exam and results.  Please take motrin round the clock for the next 6 hours, and take other meds prescribed only for break through pain. See Neurologist doctor if the pain persists, as you might need better medications or a specialist. Return to the ER if the headache leads to nausea, vomiting, visual complains, seizures, altered mental status, loss of consciousness, new weakness, or numbness, no gait instability.   Viral Infections A viral infection can be caused by different types of viruses.Most viral infections are not serious and resolve on their own. However, some infections may cause severe symptoms and may lead to further complications. SYMPTOMS Viruses can frequently cause:  Minor sore throat.  Aches and pains.  Headaches.  Runny nose.  Different types of rashes.  Watery eyes.  Tiredness.  Cough.  Loss of appetite.  Gastrointestinal infections, resulting in nausea, vomiting, and diarrhea. These symptoms do not respond to antibiotics because the infection is not caused by bacteria. However, you might catch a bacterial infection following the viral infection. This is sometimes called a "superinfection." Symptoms of such a bacterial infection may include:  Worsening sore throat with pus and difficulty swallowing.  Swollen neck glands.  Chills and a high or persistent fever.  Severe headache.  Tenderness over the sinuses.  Persistent overall ill feeling  (malaise), muscle aches, and tiredness (fatigue).  Persistent cough.  Yellow, green, or brown mucus production with coughing. HOME CARE INSTRUCTIONS   Only take over-the-counter or prescription medicines for pain, discomfort, diarrhea, or fever as directed by your caregiver.  Drink enough water and fluids to keep your urine clear or pale yellow. Sports drinks can provide valuable electrolytes, sugars, and hydration.  Get plenty of rest and maintain proper nutrition. Soups and broths with crackers or rice are fine. SEEK IMMEDIATE MEDICAL CARE IF:   You have severe headaches, shortness of breath, chest pain, neck pain, or an unusual rash.  You have uncontrolled vomiting, diarrhea, or you are unable to keep down fluids.  You or your child has an oral temperature above 102 F (38.9 C), not controlled by medicine.  Your baby is older than 3 months with a rectal temperature of 102 F (38.9 C) or higher.  Your baby is 24 months old or younger with a rectal temperature of 100.4 F (38 C) or higher. MAKE SURE YOU:   Understand these instructions.  Will watch your condition.  Will get help right away if you are not doing well or get worse.   This information is not intended to replace advice given to you by your health care provider. Make sure you discuss any questions you have with your health care provider.   Document Released: 05/23/2005 Document Revised: 11/05/2011 Document Reviewed: 01/19/2015 Elsevier Interactive Patient Education 2016 [employer]  General Headache Without Cause A headache is pain or discomfort felt around the head or neck area. The specific cause of a headache may not be found. There  are many causes and types of headaches. A few common ones are:  Tension headaches.  Migraine headaches.  Cluster headaches.  Chronic daily headaches. HOME CARE INSTRUCTIONS  Watch your condition for any changes. Take these steps to help with your condition: Managing  Pain  Take over-the-counter and prescription medicines only as told by your health care provider.  Lie down in a dark, quiet room when you have a headache.  If directed, apply ice to the head and neck area:  Put ice in a plastic bag.  Place a towel between your skin and the bag.  Leave the ice on for 20 minutes, 2-3 times per day.  Use a heating pad or hot shower to apply heat to the head and neck area as told by your health care provider.  Keep lights dim if bright lights bother you or make your headaches worse. Eating and Drinking  Eat meals on a regular schedule.  Limit alcohol use.  Decrease the amount of caffeine you drink, or stop drinking caffeine. General Instructions  Keep all follow-up visits as told by your health care provider. This is important.  Keep a headache journal to help find out what may trigger your headaches. For example, write down:  What you eat and drink.  How much sleep you get.  Any change to your diet or medicines.  Try massage or other relaxation techniques.  Limit stress.  Sit up straight, and do not tense your muscles.  Do not use tobacco products, including cigarettes, chewing tobacco, or e-cigarettes. If you need help quitting, ask your health care provider.  Exercise regularly as told by your health care provider.  Sleep on a regular schedule. Get 7-9 hours of sleep, or the amount recommended by your health care provider. SEEK MEDICAL CARE IF:   Your symptoms are not helped by medicine.  You have a headache that is different from the usual headache.  You have nausea or you vomit.  You have a fever. SEEK IMMEDIATE MEDICAL CARE IF:   Your headache becomes severe.  You have repeated vomiting.  You have a stiff neck.  You have a loss of vision.  You have problems with speech.  You have pain in the eye or ear.  You have muscular weakness or loss of muscle control.  You lose your balance or have trouble  walking.  You feel faint or pass out.  You have confusion.   This information is not intended to replace advice given to you by your health care provider. Make sure you discuss any questions you have with your health care provider.   Document Released: 08/13/2005 Document Revised: 05/04/2015 Document Reviewed: 12/06/2014 Elsevier Interactive Patient Education [employer]

## 2015-06-09 NOTE — ED Provider Notes (Signed)
CSN: [id number]     Arrival date & time 06/08/15  2251 History  By signing my name below, I, Maria Carpenter, attest that this documentation has been prepared under the direction and in the presence of Derwood Kaplan, MD. Electronically Signed: Murriel Carpenter, ED Scribe. 06/09/2015. 5:01 AM.    Chief Complaint  Patient presents with  . Multiple Complaints       The history is provided by the patient. No language interpreter was used.   HPI Comments: Maria Carpenter is a 34 y.o. female with DM, hx of stroke, sleep apnea, rheumatoid arthritis who presents to the Emergency Department complaining of intermittent worsening productive cough with associated congestion, weakness, blurred vision, subjective fever, headache that has been present for around 10 days. Pt states she had a positive sick contact around her son when her symptoms began a while ago. Pt states she began developing a cough, congestion, and intermittent fever around ten days ago, and since then has been having a worsening headache, feels weak, and for the past couple of days has had blurred and double vision in addition to photophobia associated with her headache. Pt states when she coughs she feels a pressure-type pain in her head and reports having a productive cough for a few days with green and yellow phlegm and red streaks of blood in her phlegm. Pt also reports sitting down helps reduce her cough, but any sort of movement or lying down makes it worse. Pt states she has tried taking Robitussin, other OTC cough syrup, tea and honey, and taking her asthma medications for her symptoms with no relief. Pt denies vaginal discharge, vaginal bleeding, dysuria, hematuria. PT not on an immunomodulators for RA.    Past Medical History  Diagnosis Date  . Diabetes mellitus     A2DM during pregnancy. now on metformin.   [name] Hypertension   . Gonorrhea 2010    treated  . Chlamydia 2012    treated  . Pregnancy induced hypertension     pre  E with pregnancies  . Asthma   . Rheumatoid arthritis(714.0)   . Stroke [facility]) 2012    March 2012>right side deficit (speech, upper/ower weakness)  . SVT (supraventricular tachycardia) (HCC)   . Complication of anesthesia     " they have trouble waking me up"  . Family history of anesthesia complication     mother & daughter   . Shortness of breath   . Sleep apnea     does not use cpap  . GERD (gastroesophageal reflux disease)   . Anemia   . Lupus (HCC) 10-2013   Past Surgical History  Procedure Laterality Date  . Tubal ligation  2009  . Finger surgery  1993    left ring finger to correct bone "problem"  . Cholecystectomy  07/22/2011    Procedure: LAPAROSCOPIC CHOLECYSTECTOMY WITH INTRAOPERATIVE CHOLANGIOGRAM;  Surgeon: Kandis Cocking, MD;  Location: [facility] ORS;  Service: General;  Laterality: N/A;  . Ercp  07/24/2011    Procedure: ENDOSCOPIC RETROGRADE CHOLANGIOPANCREATOGRAPHY (ERCP);  Surgeon: Theda Belfast;  Location: [facility] ENDOSCOPY;  Service: Endoscopy;  Laterality: N/A;   Family History  Problem Relation Age of Onset  . Anesthesia problems Mother     difficult to arouse, SOB  . Diabetes Mother   . Hypertension Mother   . Stroke Mother   . Diabetes Father   . Hypertension Father   . Heart disease Father   . Cancer Maternal Aunt   . Cancer Maternal Grandmother  Social History  Substance Use Topics  . Smoking status: Current Every Day Smoker -- 0.25 packs/day for 4 years    Types: Cigarettes  . Smokeless tobacco: Never Used  . Alcohol Use: Yes     Comment: socially   OB History    Gravida Para Term Preterm AB TAB SAB Ectopic Multiple Living   9 6 0 6 3 0 3 0 0 6      Review of Systems  Constitutional: Positive for fever.  HENT: Positive for congestion.   Eyes: Positive for photophobia and visual disturbance.  Respiratory: Positive for cough.   Genitourinary: Negative for dysuria, hematuria, vaginal bleeding and vaginal discharge.  Neurological: Positive for  weakness and headaches.  All other systems reviewed and are negative.     Allergies  Amoxicillin and Dilaudid  Home Medications   Prior to Admission medications   Medication Sig Start Date End Date Taking? Authorizing Provider  albuterol (PROVENTIL HFA;VENTOLIN HFA) 108 (90 BASE) MCG/ACT inhaler Inhale 2 puffs into the lungs every 6 (six) hours as needed. For wheezing   Yes Historical Provider, MD  Cholecalciferol (VITAMIN D PO) Take 1 tablet by mouth daily.   Yes Historical Provider, MD  diphenhydrAMINE (BENADRYL) 25 mg capsule Take 25 mg by mouth every 6 (six) hours as needed for itching or allergies.   Yes Historical Provider, MD  guaiFENesin (ROBITUSSIN) 100 MG/5ML liquid Take 200 mg by mouth 3 (three) times daily as needed for cough.   Yes Historical Provider, MD  hydrochlorothiazide (HYDRODIURIL) 25 MG tablet Take 1 tablet (25 mg total) by mouth daily. 04/25/14  Yes 04/27/14, MD  Multiple Vitamins-Minerals (MULTIVITAMIN WITH MINERALS) tablet Take 1 tablet by mouth daily.     Yes Historical Provider, MD  azithromycin (ZITHROMAX) 250 MG tablet Take 1 tablet (250 mg total) by mouth daily. Take first 2 tablets together, then 1 every day until finished. 06/09/15   06/11/15, MD  benzonatate (TESSALON) 100 MG capsule Take 1 capsule (100 mg total) by mouth every 8 (eight) hours. 06/09/15   06/11/15, MD  doxycycline (VIBRAMYCIN) 100 MG capsule Take 1 capsule (100 mg total) by mouth 2 (two) times daily. One po bid x 7 days Patient not taking: Reported on 06/09/2015 12/20/14   12/22/14, PA-C  ibuprofen (ADVIL,MOTRIN) 600 MG tablet Take 1 tablet (600 mg total) by mouth every 6 (six) hours as needed. 06/09/15   06/11/15, MD  sulfamethoxazole-trimethoprim (SEPTRA DS) 800-160 MG per tablet Take 1 tablet by mouth every 12 (twelve) hours. Patient not taking: Reported on 12/20/2014 04/25/14   04/27/14, MD   BP 144/85 mmHg  Pulse 87  Temp(Src) 99.3 F (37.4  C) (Oral)  Resp 25  SpO2 100%  LMP 03/08/2015 Physical Exam  Constitutional: She is oriented to person, place, and time. She appears well-developed.  HENT:  Head: Normocephalic and atraumatic.  No nystagmus  Eyes: Conjunctivae and EOM are normal. Pupils are equal, round, and reactive to light.  Neck: Normal range of motion. Neck supple.  No nuchal rigidity  Cardiovascular: Normal rate, regular rhythm and normal heart sounds.   Pulmonary/Chest: Effort normal and breath sounds normal. No respiratory distress.  Diffuse inspiratory and expiratory wheezing worse on the left side  Abdominal: Soft. Bowel sounds are normal. She exhibits no distension. There is no tenderness. There is no rebound and no guarding.  Pt has focal left lower abdominal tenderness with no peritoneal signs   Neurological: She is alert and  oriented to person, place, and time. No cranial nerve deficit.  Cranial nerves 2-12 intact  Sensory exam: mild subjective numbness in lower extremity, upper extremity bilateral normal  Strength lower and upper extremity 5/5   Skin: Skin is warm and dry.  Nursing note and vitals reviewed.   ED Course  Procedures (including critical care time)  DIAGNOSTIC STUDIES: Oxygen Saturation is 99% on room air, normal by my interpretation.    COORDINATION OF CARE: 4:00 Discussed treatment plan with pt at bedside and pt agreed to plan.  Upon reassessment, patient reports that the headache has resolved. She continued to have no neurologic complains. Strict return precautions discussed, pt will return to the ER if there is visual complains, seizures, altered mental status, loss of consciousness, dizziness, new focal weakness, or numbness.  Pt has RA and DM with slight elevation of W.C. We will give antibiotics as wait and see approach for possible CAP. She is to see pcp tomorrow.     Labs Review Labs Reviewed  CBC - Abnormal; Notable for the following:    WBC 11.2 (*)    All other  components within normal limits  BASIC METABOLIC PANEL  LIPASE, BLOOD  URINALYSIS, ROUTINE W REFLEX MICROSCOPIC (NOT AT [facility])  PREGNANCY, URINE  I-STAT TROPOININ, ED    Imaging Review Dg Chest 2 View  06/08/2015  CLINICAL DATA:  34 year old female with acute cough and congestion for 2 weeks. EXAM: CHEST  2 VIEW COMPARISON:  04/25/2014 FINDINGS: The cardiomediastinal silhouette is unremarkable. Mild peribronchial thickening is unchanged. There is no evidence of focal airspace disease, pulmonary edema, suspicious pulmonary nodule/mass, pleural effusion, or pneumothorax. No acute bony abnormalities are identified. IMPRESSION: No active cardiopulmonary disease. Electronically Signed   By: Harmon Pier M.D.   On: 06/08/2015 23:38   Ct Head Wo Contrast  06/09/2015  CLINICAL DATA:  Weakness, fever, headache, blurry vision, photophobia. History of hypertension, diabetes, stroke with residual RIGHT-sided deficits, rheumatoid arthritis, lupus. EXAM: CT HEAD WITHOUT CONTRAST TECHNIQUE: Contiguous axial images were obtained from the base of the skull through the vertex without intravenous contrast. COMPARISON:  CT head April 25, 2014 FINDINGS: The ventricles and sulci are normal. No intraparenchymal hemorrhage, mass effect nor midline shift. No acute large vascular territory infarcts. No abnormal extra-axial fluid collections. Basal cisterns are patent. Fluid filled non expanded sella. No skull fracture. The included ocular globes and orbital contents are non-suspicious. The mastoid aircells and included paranasal sinuses are well-aerated. Small intra parotid lymph nodes. IMPRESSION: Negative noncontrast CT head. Electronically Signed   By: Awilda Metro M.D.   On: 06/09/2015 03:51   Ct Abdomen Pelvis W Contrast  06/09/2015  CLINICAL DATA:  Fever, weakness, left lower quadrant pain. EXAM: CT ABDOMEN AND PELVIS WITH CONTRAST TECHNIQUE: Multidetector CT imaging of the abdomen and pelvis was performed using  the standard protocol following bolus administration of intravenous contrast. CONTRAST:  OMNIPAQUE IOHEXOL 300 MG/ML  SOLN COMPARISON:  01/24/2012 FINDINGS: Lung bases are clear. Surgical absence of the gallbladder. No bile duct dilatation. The liver, spleen, pancreas, adrenal glands, kidneys, abdominal aorta, inferior vena cava, and retroperitoneal lymph nodes are unremarkable. Stomach and small bowel are mostly decompressed. Stool-filled colon without abnormal distention. No free air or free fluid in the abdomen. Small umbilical hernia containing fat. Pelvis: Appendix is normal. Rectosigmoid colon is decompressed. No evidence of diverticulitis. Uterus and ovaries are not enlarged. Bladder wall is not thickened. No free or loculated pelvic fluid collections. No pelvic mass or lymphadenopathy.  No destructive bone lesions. IMPRESSION: No acute process demonstrated in the abdomen or pelvis. No evidence of bowel obstruction or inflammation. Electronically Signed   By: Burman Nieves M.D.   On: 06/09/2015 04:05   I have personally reviewed and evaluated these images and lab results as part of my medical decision-making.   EKG Interpretation   Date/Time:  Wednesday June 08 2015 23:21:23 EDT Ventricular Rate:  97 PR Interval:  161 QRS Duration: 88 QT Interval:  348 QTC Calculation: 442 R Axis:   72 Text Interpretation:  Sinus rhythm Probable left atrial enlargement No  significant change since last tracing Confirmed by Ethelda Chick  MD, SAM  [id number]) on 06/09/2015 12:19:01 AM      MDM   Final diagnoses:  Acute intractable headache, unspecified headache type  URI, acute   I personally performed the services described in this documentation, which was scribed in my presence. The recorded information has been reviewed and is accurate.  Pt comes in with cc of uri like sx. She also has headache, worse when laying down and with cough. She is obese. No increased estrogen status. C/o  intermittent blurry vision, photophobia and pain is worse with cough and she has a non focal neuro exam.  Idiopathic intracranial hypertension, thrombosis, complex headaches, meningitis - likely viral. Pt is not toxic appearing, and the headache is new. CT head ordered and is neg.  She also has uri like sx and L sided abd tenderness. DDX: Diverticulitis, PNA. CT is neg for acute process. She has no pelvic complains. Xrays are neg.  Derwood Kaplan, MD 06/09/15 [phone]

## 2015-06-09 NOTE — ED Notes (Signed)
Pt is aware she needs to provide a urine sample. Pt doesn't have to go at this time but will notify when she is able to void

## 2015-06-11 ENCOUNTER — Encounter (HOSPITAL_COMMUNITY): Payer: Self-pay

## 2015-06-11 ENCOUNTER — Inpatient Hospital Stay (HOSPITAL_COMMUNITY)
Admission: AD | Admit: 2015-06-11 | Discharge: 2015-06-11 | Disposition: A | Discharge status: Home / Self Care | Payer: Self-pay | Source: Ambulatory Visit | Attending: Obstetrics & Gynecology | Admitting: Obstetrics & Gynecology

## 2015-06-11 DIAGNOSIS — E119 Type 2 diabetes mellitus without complications: Secondary | ICD-10-CM | POA: Insufficient documentation

## 2015-06-11 DIAGNOSIS — I10 Essential (primary) hypertension: Secondary | ICD-10-CM | POA: Insufficient documentation

## 2015-06-11 DIAGNOSIS — M7918 Myalgia, other site: Secondary | ICD-10-CM

## 2015-06-11 DIAGNOSIS — M791 Myalgia: Secondary | ICD-10-CM | POA: Insufficient documentation

## 2015-06-11 DIAGNOSIS — N912 Amenorrhea, unspecified: Secondary | ICD-10-CM | POA: Insufficient documentation

## 2015-06-11 DIAGNOSIS — R109 Unspecified abdominal pain: Secondary | ICD-10-CM | POA: Insufficient documentation

## 2015-06-11 LAB — COMPREHENSIVE METABOLIC PANEL
ALT: 15 U/L (ref 14–54)
AST: 19 U/L (ref 15–41)
Albumin: 3.8 g/dL (ref 3.5–5.0)
Alkaline Phosphatase: 89 U/L (ref 38–126)
Anion gap: 4 — ABNORMAL LOW (ref 5–15)
BUN: 12 mg/dL (ref 6–20)
CO2: 26 mmol/L (ref 22–32)
Calcium: 9 mg/dL (ref 8.9–10.3)
Chloride: 107 mmol/L (ref 101–111)
Creatinine, Ser: 0.66 mg/dL (ref 0.44–1.00)
GFR calc Af Amer: >60 mL/min (ref >60)
GFR calc non Af Amer: >60 mL/min (ref >60)
Glucose, Bld: 132 mg/dL — ABNORMAL HIGH (ref 65–99)
Potassium: 4 mmol/L (ref 3.5–5.1)
Sodium: 137 mmol/L (ref 135–145)
Total Bilirubin: 0.3 mg/dL (ref 0.3–1.2)
Total Protein: 8 g/dL (ref 6.5–8.1)

## 2015-06-11 LAB — CBC
HCT: 38.7 % (ref 36.0–46.0)
Hemoglobin: 13 g/dL (ref 12.0–15.0)
MCH: 29.1 pg (ref 26.0–34.0)
MCHC: 33.6 g/dL (ref 30.0–36.0)
MCV: 86.6 fL (ref 78.0–100.0)
Platelets: 253 10*3/uL (ref 150–400)
RBC: 4.47 MIL/uL (ref 3.87–5.11)
RDW: 15.2 % (ref 11.5–15.5)
WBC: 8 10*3/uL (ref 4.0–10.5)

## 2015-06-11 LAB — HCG, QUANTITATIVE, PREGNANCY: hCG, Beta Chain, Quant, S: <1 m[IU]/mL (ref <5)

## 2015-06-11 LAB — WET PREP, GENITAL
Clue Cells Wet Prep HPF POC: NONE SEEN
Trich, Wet Prep: NONE SEEN
Yeast Wet Prep HPF POC: NONE SEEN

## 2015-06-11 LAB — URINALYSIS, ROUTINE W REFLEX MICROSCOPIC
Bilirubin Urine: NEGATIVE
Glucose, UA: NEGATIVE mg/dL
Ketones, ur: NEGATIVE mg/dL
Leukocytes, UA: NEGATIVE
Nitrite: NEGATIVE
Protein, ur: NEGATIVE mg/dL
Specific Gravity, Urine: >1.03 — ABNORMAL HIGH (ref 1.005–1.030)
Urobilinogen, UA: 0.2 mg/dL (ref 0.0–1.0)
pH: 6 (ref 5.0–8.0)

## 2015-06-11 LAB — URINE MICROSCOPIC-ADD ON

## 2015-06-11 MED ORDER — LISINOPRIL 20 MG PO TABS
20.0000 mg | ORAL_TABLET | Freq: Once | ORAL | Status: AC
  Administered 2015-06-11: 20 mg via ORAL
  Filled 2015-06-11: qty 1

## 2015-06-11 MED ORDER — LISINOPRIL 20 MG PO TABS: 20.0000 mg | ORAL_TABLET | Freq: Every day | ORAL | Status: DC

## 2015-06-11 MED ORDER — CYCLOBENZAPRINE HCL 10 MG PO TABS: 5.0000 mg | ORAL_TABLET | Freq: Three times a day (TID) | ORAL | Status: DC | PRN

## 2015-06-11 NOTE — MAU Note (Signed)
Pt presents to MAU with complaints of lower abdominal pain, + home pregnancy test last week with pain in more in the left side of her abdomen. BTL 7 years ago.

## 2015-06-11 NOTE — Discharge Instructions (Signed)
Musculoskeletal Pain °Musculoskeletal pain is muscle and boney aches and pains. These pains can occur in any part of the body. Your caregiver may treat you without knowing the cause of the pain. They may treat you if blood or urine tests, X-rays, and other tests were normal.  °CAUSES °There is often not a definite cause or reason for these pains. These pains may be caused by a type of germ (virus). The discomfort may also come from overuse. Overuse includes working out too hard when your body is not fit. Boney aches also come from weather changes. Bone is sensitive to atmospheric pressure changes. °HOME CARE INSTRUCTIONS  °· Ask when your test results will be ready. Make sure you get your test results. °· Only take over-the-counter or prescription medicines for pain, discomfort, or fever as directed by your caregiver. If you were given medications for your condition, do not drive, operate machinery or power tools, or sign legal documents for 24 hours. Do not drink alcohol. Do not take sleeping pills or other medications that may interfere with treatment. °· Continue all activities unless the activities cause more pain. When the pain lessens, slowly resume normal activities. Gradually increase the intensity and duration of the activities or exercise. °· During periods of severe pain, bed rest may be helpful. Lay or sit in any position that is comfortable. °· Putting ice on the injured area. °· Put ice in a bag. °· Place a towel between your skin and the bag. °· Leave the ice on for 15 to 20 minutes, 3 to 4 times a day. °· Follow up with your caregiver for continued problems and no reason can be found for the pain. If the pain becomes worse or does not go away, it may be necessary to repeat tests or do additional testing. Your caregiver may need to look further for a possible cause. °SEEK IMMEDIATE MEDICAL CARE IF: °· You have pain that is getting worse and is not relieved by medications. °· You develop chest pain  that is associated with shortness or breath, sweating, feeling sick to your stomach (nauseous), or throw up (vomit). °· Your pain becomes localized to the abdomen. °· You develop any new symptoms that seem different or that concern you. °MAKE SURE YOU:  °· Understand these instructions. °· Will watch your condition. °· Will get help right away if you are not doing well or get worse. °  °This information is not intended to replace advice given to you by your health care provider. Make sure you discuss any questions you have with your health care provider. °  °Document Released: 08/13/2005 Document Revised: 11/05/2011 Document Reviewed: 04/17/2013 °Elsevier Interactive Patient Education ©2016 [employer] °Hypertension °Hypertension, commonly called high blood pressure, is when the force of blood pumping through your arteries is too strong. Your arteries are the blood vessels that carry blood from your heart throughout your body. A blood pressure reading consists of a higher number over a lower number, such as 110/72. The higher number (systolic) is the pressure inside your arteries when your heart pumps. The lower number (diastolic) is the pressure inside your arteries when your heart relaxes. Ideally you want your blood pressure below 120/80. °Hypertension forces your heart to work harder to pump blood. Your arteries may become narrow or stiff. Having untreated or uncontrolled hypertension can cause heart attack, stroke, kidney disease, and other problems. °RISK FACTORS °Some risk factors for high blood pressure are controllable. Others are not.  °Risk factors you cannot   control include:  °· Race. You may be at higher risk if you are African American. °· Age. Risk increases with age. °· Gender. Men are at higher risk than women before age 45 years. After age 65, women are at higher risk than men. °Risk factors you can control include: °· Not getting enough exercise or physical activity. °· Being  overweight. °· Getting too much fat, sugar, calories, or salt in your diet. °· Drinking too much alcohol. °SIGNS AND SYMPTOMS °Hypertension does not usually cause signs or symptoms. Extremely high blood pressure (hypertensive crisis) may cause headache, anxiety, shortness of breath, and nosebleed. °DIAGNOSIS °To check if you have hypertension, your health care provider will measure your blood pressure while you are seated, with your arm held at the level of your heart. It should be measured at least twice using the same arm. Certain conditions can cause a difference in blood pressure between your right and left arms. A blood pressure reading that is higher than normal on one occasion does not mean that you need treatment. If it is not clear whether you have high blood pressure, you may be asked to return on a different day to have your blood pressure checked again. Or, you may be asked to monitor your blood pressure at home for 1 or more weeks. °TREATMENT °Treating high blood pressure includes making lifestyle changes and possibly taking medicine. Living a healthy lifestyle can help lower high blood pressure. You may need to change some of your habits. °Lifestyle changes may include: °· Following the DASH diet. This diet is high in fruits, vegetables, and whole grains. It is low in salt, red meat, and added sugars. °· Keep your sodium intake below 2,300 mg per day. °· Getting at least 30-45 minutes of aerobic exercise at least 4 times per week. °· Losing weight if necessary. °· Not smoking. °· Limiting alcoholic beverages. °· Learning ways to reduce stress. °Your health care provider may prescribe medicine if lifestyle changes are not enough to get your blood pressure under control, and if one of the following is true: °· You are 18-59 years of age and your systolic blood pressure is above 140. °· You are 60 years of age or older, and your systolic blood pressure is above 150. °· Your diastolic blood pressure is  above 90. °· You have diabetes, and your systolic blood pressure is over 140 or your diastolic blood pressure is over 90. °· You have kidney disease and your blood pressure is above 140/90. °· You have heart disease and your blood pressure is above 140/90. °Your personal target blood pressure may vary depending on your medical conditions, your age, and other factors. °HOME CARE INSTRUCTIONS °· Have your blood pressure rechecked as directed by your health care provider.   °· Take medicines only as directed by your health care provider. Follow the directions carefully. Blood pressure medicines must be taken as prescribed. The medicine does not work as well when you skip doses. Skipping doses also puts you at risk for problems. °· Do not smoke.   °· Monitor your blood pressure at home as directed by your health care provider.  °SEEK MEDICAL CARE IF:  °· You think you are having a reaction to medicines taken. °· You have recurrent headaches or feel dizzy. °· You have swelling in your ankles. °· You have trouble with your vision. °SEEK IMMEDIATE MEDICAL CARE IF: °· You develop a severe headache or confusion. °· You have unusual weakness, numbness, or feel faint. °·   You have severe chest or abdominal pain. °· You vomit repeatedly. °· You have trouble breathing. °MAKE SURE YOU:  °· Understand these instructions. °· Will watch your condition. °· Will get help right away if you are not doing well or get worse. °  °This information is not intended to replace advice given to you by your health care provider. Make sure you discuss any questions you have with your health care provider. °  °Document Released: 08/13/2005 Document Revised: 12/28/2014 Document Reviewed: 06/05/2013 °Elsevier Interactive Patient Education ©2016 Else[employer] Inc. ° °

## 2015-06-12 LAB — HIV ANTIBODY (ROUTINE TESTING W REFLEX): HIV Screen 4th Generation wRfx: NONREACTIVE

## 2015-06-13 LAB — GC/CHLAMYDIA PROBE AMP (~~LOC~~) NOT AT ARMC
Chlamydia: NEGATIVE
Neisseria Gonorrhea: NEGATIVE

## 2015-06-21 ENCOUNTER — Ambulatory Visit: Payer: Self-pay | Attending: Family Medicine

## 2015-06-23 ENCOUNTER — Ambulatory Visit: Payer: Self-pay | Attending: Family Medicine | Admitting: Family Medicine

## 2015-06-23 ENCOUNTER — Encounter: Payer: Self-pay | Admitting: Family Medicine

## 2015-06-23 VITALS — BP 135/82 | HR 91 | Temp 98.0°F | Resp 18 | Ht 67.0 in | Wt 347.0 lb

## 2015-06-23 DIAGNOSIS — R1030 Lower abdominal pain, unspecified: Secondary | ICD-10-CM

## 2015-06-23 DIAGNOSIS — I471 Supraventricular tachycardia, unspecified: Secondary | ICD-10-CM | POA: Insufficient documentation

## 2015-06-23 DIAGNOSIS — Z79899 Other long term (current) drug therapy: Secondary | ICD-10-CM | POA: Insufficient documentation

## 2015-06-23 DIAGNOSIS — M329 Systemic lupus erythematosus, unspecified: Secondary | ICD-10-CM

## 2015-06-23 DIAGNOSIS — N912 Amenorrhea, unspecified: Secondary | ICD-10-CM

## 2015-06-23 DIAGNOSIS — IMO0002 Reserved for concepts with insufficient information to code with codable children: Secondary | ICD-10-CM

## 2015-06-23 DIAGNOSIS — Z8673 Personal history of transient ischemic attack (TIA), and cerebral infarction without residual deficits: Secondary | ICD-10-CM | POA: Insufficient documentation

## 2015-06-23 DIAGNOSIS — Z6841 Body Mass Index (BMI) 40.0 and over, adult: Secondary | ICD-10-CM | POA: Insufficient documentation

## 2015-06-23 DIAGNOSIS — M069 Rheumatoid arthritis, unspecified: Secondary | ICD-10-CM

## 2015-06-23 DIAGNOSIS — E119 Type 2 diabetes mellitus without complications: Secondary | ICD-10-CM

## 2015-06-23 DIAGNOSIS — I10 Essential (primary) hypertension: Secondary | ICD-10-CM

## 2015-06-23 DIAGNOSIS — E669 Obesity, unspecified: Secondary | ICD-10-CM

## 2015-06-23 LAB — POCT GLYCOSYLATED HEMOGLOBIN (HGB A1C): Hemoglobin A1C: 5.8

## 2015-06-23 LAB — GLUCOSE, POCT (MANUAL RESULT ENTRY): POC Glucose: 145 mg/dl — AB (ref 70–99)

## 2015-06-23 MED ORDER — PROPRANOLOL HCL 10 MG PO TABS: 10.0000 mg | ORAL_TABLET | Freq: Two times a day (BID) | ORAL | Status: DC

## 2015-06-23 MED ORDER — LISINOPRIL-HYDROCHLOROTHIAZIDE 20-25 MG PO TABS: 1.0000 | ORAL_TABLET | Freq: Every day | ORAL | Status: DC

## 2015-06-23 NOTE — Patient Instructions (Signed)
Obesity Obesity is defined as having too much total body fat and a body mass index (BMI) of 30 or more. BMI is an estimate of body fat and is calculated from your height and weight. BMI is typically calculated by your health care provider during regular wellness visits. Obesity happens when you consume more calories than you can burn by exercising or performing daily physical tasks. Prolonged obesity can cause major illnesses or emergencies, such as:  Stroke.  Heart disease.  Diabetes.  Cancer.  Arthritis.  High blood pressure (hypertension).  High cholesterol.  Sleep apnea.  Erectile dysfunction.  Infertility problems. CAUSES   Regularly eating unhealthy foods.  Physical inactivity.  Certain disorders, such as an underactive thyroid (hypothyroidism), Cushing's syndrome, and polycystic ovarian syndrome.  Certain medicines, such as steroids, some depression medicines, and antipsychotics.  Genetics.  Lack of sleep. DIAGNOSIS A health care provider can diagnose obesity after calculating your BMI. Obesity will be diagnosed if your BMI is 30 or higher. There are other methods of measuring obesity levels. Some other methods include measuring your skinfold thickness, your waist circumference, and comparing your hip circumference to your waist circumference. TREATMENT  A healthy treatment program includes some or all of the following:  Long-term dietary changes.  Exercise and physical activity.  Behavioral and lifestyle changes.  Medicine only under the supervision of your health care provider. Medicines may help, but only if they are used with diet and exercise programs. If your BMI is 40 or higher, your health care provider may recommend specialized surgery or programs to help with weight loss. An unhealthy treatment program includes:  Fasting.  Fad diets.  Supplements and drugs. These choices do not succeed in long-term weight control. HOME CARE  INSTRUCTIONS  Exercise and perform physical activity as directed by your health care provider. To increase physical activity, try the following:  Use stairs instead of elevators.  Park farther away from store entrances.  Garden, bike, or walk instead of watching television or using the computer.  Eat healthy, low-calorie foods and drinks on a regular basis. Eat more fruits and vegetables. Use low-calorie cookbooks or take healthy cooking classes.  Limit fast food, sweets, and processed snack foods.  Eat smaller portions.  Keep a daily journal of everything you eat. There are many free websites to help you with this. It may be helpful to measure your foods so you can determine if you are eating the correct portion sizes.  Avoid drinking alcohol. Drink more water and drinks without calories.  Take vitamins and supplements only as recommended by your health care provider.  Weight-loss support groups, registered dietitians, counselors, and stress reduction education can also be very helpful. SEEK IMMEDIATE MEDICAL CARE IF:  You have chest pain or tightness.  You have trouble breathing or feel short of breath.  You have weakness or leg numbness.  You feel confused or have trouble talking.  You have sudden changes in your vision.   This information is not intended to replace advice given to you by your health care provider. Make sure you discuss any questions you have with your health care provider.   Document Released: 09/20/2004 Document Revised: 09/03/2014 Document Reviewed: 09/19/2011 Elsevier Interactive Patient Education 2016 Elsevier Inc.  

## 2015-06-23 NOTE — Progress Notes (Signed)
Pt's here to est. care with PCP.  Pt's here for HFU, for Musculosketal pain  Pt's haven't her menses since July 2016 and c/o pain on L side described as shooting pain that causes pain in her uterus since August has increased throughtout the day rated 5/10.  Pt was Dx with diabetes in 2010, states that she hasn't been on insulin since 2012, but states recently her levels were elevated.  Pt reports taking meds this morning.

## 2015-06-23 NOTE — Progress Notes (Signed)
Subjective:  Patient ID: Maria Carpenter, female    DOB: 21-Jun-1981  Age: 34 y.o. MRN: [id number]  CC: ED follow-up.  HPI Maria Carpenter is a 34 year old female with a history of type 2 diabetes mellitus, history of stroke, sleep apnea, rheumatoid arthritis who presented to the ED at [facility] on 06/09/15 with cough, subjective fever, headache, blurry vision or photophobia weakness and other upper respiratory symptoms. Her oxygen saturation was normal on room air, labs revealed mild leukocytosis of 11.2, chest x-ray revealed no active cardiopulmonary disease, CT head was negative, CT abdomen and pelvis with contrast revealed no acute process in the abdomen or pelvis. She was given a prescription for azithromycin to fill only if symptoms did not improve.  Interval history: She never filled the azithromycin because she felt better. She has a host of complaints today. States she has a history of SVT and has been having some random intermittent chest pains (which is absent now) -has been off her propranolol which she was given for SVT. Does have a history of rheumatoid arthritis and lupus and has not been to see a rheumatologist, denies joint pains. Complains of not having a period for the last 3 months and had a positive urine pregnancy test at home and would like to be checked again here; complains of low abdominal pain which feels like a "pulling sensation" States her blood pressure at home is usually on the high side with systolics in the 150-160 range but she has been taking only hydrochlorothiazide and never filled the lisinopril which she was given because "it does not work"  She is also concerned that she is unable to lose weight despite trying to eat healthy.  Outpatient Prescriptions Prior to Visit  Medication Sig Dispense Refill  . albuterol (PROVENTIL HFA;VENTOLIN HFA) 108 (90 BASE) MCG/ACT inhaler Inhale 2 puffs into the lungs every 6 (six) hours as needed. For wheezing    .  Cholecalciferol (VITAMIN D PO) Take 1 tablet by mouth daily.    . cyclobenzaprine (FLEXERIL) 10 MG tablet Take 0.5-1 tablets (5-10 mg total) by mouth 3 (three) times daily as needed for muscle spasms. 30 tablet 1  . diphenhydrAMINE (BENADRYL) 25 mg capsule Take 25 mg by mouth every 6 (six) hours as needed for itching or allergies.    . hydrochlorothiazide (HYDRODIURIL) 25 MG tablet Take 1 tablet (25 mg total) by mouth daily. 30 tablet 5  . lisinopril (PRINIVIL,ZESTRIL) 20 MG tablet Take 1 tablet (20 mg total) by mouth daily. 30 tablet 1  . Multiple Vitamins-Minerals (MULTIVITAMIN WITH MINERALS) tablet Take 1 tablet by mouth daily.      [name] azithromycin (ZITHROMAX) 250 MG tablet Take 1 tablet (250 mg total) by mouth daily. Take first 2 tablets together, then 1 every day until finished. (Patient not taking: Reported on 06/23/2015) 6 tablet 0  . benzonatate (TESSALON) 100 MG capsule Take 1 capsule (100 mg total) by mouth every 8 (eight) hours. (Patient not taking: Reported on 06/23/2015) 21 capsule 0  . ibuprofen (ADVIL,MOTRIN) 600 MG tablet Take 1 tablet (600 mg total) by mouth every 6 (six) hours as needed. (Patient not taking: Reported on 06/23/2015) 30 tablet 0   No facility-administered medications prior to visit.    ROS Review of Systems  Constitutional: Negative for activity change, appetite change and fatigue.  HENT: Negative for congestion, sinus pressure and sore throat.   Eyes: Negative for visual disturbance.  Respiratory: Negative for cough, chest tightness, shortness of breath and  wheezing.   Cardiovascular: Positive for palpitations. Negative for chest pain.  Gastrointestinal: Positive for abdominal pain. Negative for constipation and abdominal distention.  Endocrine: Negative for polydipsia.  Genitourinary: Positive for menstrual problem. Negative for dysuria and frequency.  Musculoskeletal: Negative for back pain and arthralgias.  Skin: Negative for rash.  Neurological: Negative  for tremors, light-headedness and numbness.  Hematological: Does not bruise/bleed easily.  Psychiatric/Behavioral: Negative for behavioral problems and agitation.    Objective:  BP 135/82 mmHg  Pulse 91  Temp(Src) 98 F (36.7 C) (Oral)  Resp 18  Ht 5\' 7"  (1.702 m)  Wt 347 lb (157.398 kg)  BMI 54.34 kg/m2  SpO2 99%  LMP 03/18/2015  BP/Weight 06/23/2015 06/11/2015 06/09/2015  Systolic BP 135 166 144  Diastolic BP 82 11 85  Wt. (Lbs) 347 340 -  BMI 54.34 54.9 -    Lab Results  Component Value Date   WBC 8.0 06/11/2015   HGB 13.0 06/11/2015   HCT 38.7 06/11/2015   PLT 253 06/11/2015   GLUCOSE 132* 06/11/2015   CHOL 147 07/02/2012   TRIG 80 07/02/2012   HDL 32* 07/02/2012   LDLCALC 99 07/02/2012   ALT 15 06/11/2015   AST 19 06/11/2015   NA 137 06/11/2015   K 4.0 06/11/2015   CL 107 06/11/2015   CREATININE 0.66 06/11/2015   BUN 12 06/11/2015   CO2 26 06/11/2015   TSH 0.838 11/01/2010   INR 1.07 10/31/2010   HGBA1C 5.80 06/23/2015    Physical Exam  Constitutional: She is oriented to person, place, and time. She appears well-developed and well-nourished. No distress.  Morbidly obese.  HENT:  Head: Normocephalic.  Right Ear: External ear normal.  Left Ear: External ear normal.  Nose: Nose normal.  Mouth/Throat: Oropharynx is clear and moist.  Eyes: Conjunctivae and EOM are normal. Pupils are equal, round, and reactive to light.  Neck: Normal range of motion. No JVD present.  Cardiovascular: Normal rate, regular rhythm, normal heart sounds and intact distal pulses.  Exam reveals no gallop.   No murmur heard. Pulmonary/Chest: Effort normal and breath sounds normal. No respiratory distress. She has no wheezes. She has no rales. She exhibits no tenderness.  Abdominal: Soft. Bowel sounds are normal. She exhibits no distension and no mass. There is tenderness (mild lower abdominal tenderness).  Musculoskeletal: Normal range of motion. She exhibits no edema or  tenderness.  Neurological: She is alert and oriented to person, place, and time. She has normal reflexes.  Skin: Skin is warm and dry. She is not diaphoretic.  Psychiatric: She has a normal mood and affect.     Assessment & Plan:   1. Type 2 diabetes mellitus  Diet controlled - Glucose (CBG) - POCT A1C - Microalbumin/Creatinine Ratio, Urine  2. Essential hypertension Controlled. I have placed her on the combination pill of lisinopril/HCTZ. She has been advised to bring in her blood pressure log to next visit as she states her home blood pressures are elevated. Underlying sleep apnea could also explain elevated blood pressures.  3. Obesity Discussed reducing portion sizes and increasing physical activity. We will discuss further at the next office visit.  4. SVT (supraventricular tachycardia) (HCC) Placed on propranolol as the intermittent chest pain and she could have could be related to tachycardia from her SVT  5. Lupus (HCC) Currently not seeing rheumatology and has no recent flares.  6. Rheumatoid arthritis, involving unspecified site, unspecified rheumatoid factor presence (HCC) She will need to be referred to rheumatology  and have explained to her the process of applying for the [place] discount and [employer] card to enable this referral.  7. Amenorrhea If serum pregnancy test and urine pregnancy tests remain negative with persisting amenorrhea she may need a pelvic ultrasound - POCT urine pregnancy - hCG, serum, qualitative  8. Lower abdominal pain Unknown etiology. Cannot exclude constipation. May need pelvic ultrasound at next visit.     Follow-up: Return in about 2 weeks (around 07/07/2015) for Follow-up of Amenorrhea, hypertension with Dr Venetia Night.   Jaclyn Shaggy MD

## 2015-06-24 LAB — MICROALBUMIN / CREATININE URINE RATIO
Creatinine, Urine: 136 mg/dL (ref 20–320)
Microalb Creat Ratio: 4 mcg/mg creat (ref <30)
Microalb, Ur: 0.5 mg/dL

## 2015-06-24 LAB — HCG, SERUM, QUALITATIVE: Preg, Serum: NEGATIVE

## 2015-06-30 ENCOUNTER — Telehealth: Payer: Self-pay

## 2015-06-30 NOTE — Telephone Encounter (Signed)
CMA called pt, pt verified name and DOB. Pt was given lab results and had no further questions.

## 2015-06-30 NOTE — Telephone Encounter (Signed)
-----   Message from Jaclyn Shaggy, MD sent at 06/24/2015  1:36 PM EDT ----- Please inform the patient that labs are normal, pregnancy test is negative. Thank you.

## 2015-07-04 ENCOUNTER — Telehealth: Payer: Self-pay

## 2015-07-04 NOTE — Telephone Encounter (Signed)
CMA called pt, pt wasn't available to take my call. Left a message asking pt to call me asap.

## 2015-07-05 ENCOUNTER — Telehealth: Payer: Self-pay

## 2015-07-05 NOTE — Telephone Encounter (Signed)
Per Dr. Venetia Night. *See note below*    She was previously on HCTZ and complained to me at her last visit that her BP at home was running high hence the change.  Advise to continue HCTZ and bring in her BP log at her next visit.

## 2015-07-05 NOTE — Telephone Encounter (Signed)
Patient returned phone call. °

## 2015-07-05 NOTE — Telephone Encounter (Addendum)
Per Dr. Venetia Night, patient was given instruction at her last OV. *See note below*  Previously on HCTZ and complained to me at her last visit that her BP at home was running high hence the change.  Advise to continue HCTZ and bring in her BP log at her next visit.

## 2015-07-06 ENCOUNTER — Ambulatory Visit: Payer: Self-pay | Admitting: Family Medicine

## 2015-07-11 ENCOUNTER — Ambulatory Visit: Payer: Self-pay | Attending: Family Medicine | Admitting: Family Medicine

## 2015-07-11 ENCOUNTER — Encounter: Payer: Self-pay | Admitting: Family Medicine

## 2015-07-11 VITALS — BP 143/102 | HR 77 | Temp 98.3°F | Resp 18 | Ht 66.5 in | Wt 346.0 lb

## 2015-07-11 DIAGNOSIS — E1169 Type 2 diabetes mellitus with other specified complication: Secondary | ICD-10-CM

## 2015-07-11 DIAGNOSIS — G43809 Other migraine, not intractable, without status migrainosus: Secondary | ICD-10-CM

## 2015-07-11 DIAGNOSIS — Z79899 Other long term (current) drug therapy: Secondary | ICD-10-CM | POA: Insufficient documentation

## 2015-07-11 DIAGNOSIS — E669 Obesity, unspecified: Secondary | ICD-10-CM | POA: Insufficient documentation

## 2015-07-11 DIAGNOSIS — M329 Systemic lupus erythematosus, unspecified: Secondary | ICD-10-CM | POA: Insufficient documentation

## 2015-07-11 DIAGNOSIS — G43909 Migraine, unspecified, not intractable, without status migrainosus: Secondary | ICD-10-CM | POA: Insufficient documentation

## 2015-07-11 DIAGNOSIS — I471 Supraventricular tachycardia: Secondary | ICD-10-CM | POA: Insufficient documentation

## 2015-07-11 DIAGNOSIS — Z8673 Personal history of transient ischemic attack (TIA), and cerebral infarction without residual deficits: Secondary | ICD-10-CM | POA: Insufficient documentation

## 2015-07-11 DIAGNOSIS — M069 Rheumatoid arthritis, unspecified: Secondary | ICD-10-CM | POA: Insufficient documentation

## 2015-07-11 DIAGNOSIS — I10 Essential (primary) hypertension: Secondary | ICD-10-CM | POA: Insufficient documentation

## 2015-07-11 DIAGNOSIS — E119 Type 2 diabetes mellitus without complications: Secondary | ICD-10-CM | POA: Insufficient documentation

## 2015-07-11 DIAGNOSIS — J45909 Unspecified asthma, uncomplicated: Secondary | ICD-10-CM | POA: Insufficient documentation

## 2015-07-11 DIAGNOSIS — E282 Polycystic ovarian syndrome: Secondary | ICD-10-CM | POA: Insufficient documentation

## 2015-07-11 DIAGNOSIS — K219 Gastro-esophageal reflux disease without esophagitis: Secondary | ICD-10-CM | POA: Insufficient documentation

## 2015-07-11 DIAGNOSIS — R1013 Epigastric pain: Secondary | ICD-10-CM | POA: Insufficient documentation

## 2015-07-11 DIAGNOSIS — F17210 Nicotine dependence, cigarettes, uncomplicated: Secondary | ICD-10-CM | POA: Insufficient documentation

## 2015-07-11 DIAGNOSIS — Z880 Allergy status to penicillin: Secondary | ICD-10-CM | POA: Insufficient documentation

## 2015-07-11 DIAGNOSIS — Z885 Allergy status to narcotic agent status: Secondary | ICD-10-CM | POA: Insufficient documentation

## 2015-07-11 LAB — GLUCOSE, POCT (MANUAL RESULT ENTRY): POC Glucose: 77 mg/dl (ref 70–99)

## 2015-07-11 LAB — LIPID PANEL
Cholesterol: 148 mg/dL (ref 125–200)
HDL: 31 mg/dL — ABNORMAL LOW (ref >=46)
LDL Cholesterol: 91 mg/dL (ref <130)
Total CHOL/HDL Ratio: 4.8 Ratio (ref <=5.0)
Triglycerides: 132 mg/dL (ref <150)
VLDL: 26 mg/dL (ref <30)

## 2015-07-11 MED ORDER — TOPIRAMATE 50 MG PO TABS: 50.0000 mg | ORAL_TABLET | Freq: Two times a day (BID) | ORAL | Status: DC

## 2015-07-11 MED ORDER — PANTOPRAZOLE SODIUM 20 MG PO TBEC: 20.0000 mg | DELAYED_RELEASE_TABLET | Freq: Every day | ORAL | Status: DC

## 2015-07-11 MED ORDER — BUTALBITAL-APAP-CAFFEINE 50-325-40 MG PO TABS: 1.0000 | ORAL_TABLET | Freq: Four times a day (QID) | ORAL | Status: AC | PRN

## 2015-07-11 MED ORDER — HYDROCHLOROTHIAZIDE 25 MG PO TABS: 25.0000 mg | ORAL_TABLET | Freq: Every day | ORAL | Status: DC

## 2015-07-11 MED ORDER — LISINOPRIL 10 MG PO TABS: 10.0000 mg | ORAL_TABLET | Freq: Every day | ORAL | Status: DC

## 2015-07-11 NOTE — Patient Instructions (Signed)
Hypertension Hypertension, commonly called high blood pressure, is when the force of blood pumping through your arteries is too strong. Your arteries are the blood vessels that carry blood from your heart throughout your body. A blood pressure reading consists of a higher number over a lower number, such as 110/72. The higher number (systolic) is the pressure inside your arteries when your heart pumps. The lower number (diastolic) is the pressure inside your arteries when your heart relaxes. Ideally you want your blood pressure below 120/80. Hypertension forces your heart to work harder to pump blood. Your arteries may become narrow or stiff. Having untreated or uncontrolled hypertension can cause heart attack, stroke, kidney disease, and other problems. RISK FACTORS Some risk factors for high blood pressure are controllable. Others are not.  Risk factors you cannot control include:   Race. You may be at higher risk if you are African American.  Age. Risk increases with age.  Gender. Men are at higher risk than women before age 45 years. After age 65, women are at higher risk than men. Risk factors you can control include:  Not getting enough exercise or physical activity.  Being overweight.  Getting too much fat, sugar, calories, or salt in your diet.  Drinking too much alcohol. SIGNS AND SYMPTOMS Hypertension does not usually cause signs or symptoms. Extremely high blood pressure (hypertensive crisis) may cause headache, anxiety, shortness of breath, and nosebleed. DIAGNOSIS To check if you have hypertension, your health care provider will measure your blood pressure while you are seated, with your arm held at the level of your heart. It should be measured at least twice using the same arm. Certain conditions can cause a difference in blood pressure between your right and left arms. A blood pressure reading that is higher than normal on one occasion does not mean that you need treatment. If  it is not clear whether you have high blood pressure, you may be asked to return on a different day to have your blood pressure checked again. Or, you may be asked to monitor your blood pressure at home for 1 or more weeks. TREATMENT Treating high blood pressure includes making lifestyle changes and possibly taking medicine. Living a healthy lifestyle can help lower high blood pressure. You may need to change some of your habits. Lifestyle changes may include:  Following the DASH diet. This diet is high in fruits, vegetables, and whole grains. It is low in salt, red meat, and added sugars.  Keep your sodium intake below 2,300 mg per day.  Getting at least 30-45 minutes of aerobic exercise at least 4 times per week.  Losing weight if necessary.  Not smoking.  Limiting alcoholic beverages.  Learning ways to reduce stress. Your health care provider may prescribe medicine if lifestyle changes are not enough to get your blood pressure under control, and if one of the following is true:  You are 18-59 years of age and your systolic blood pressure is above 140.  You are 60 years of age or older, and your systolic blood pressure is above 150.  Your diastolic blood pressure is above 90.  You have diabetes, and your systolic blood pressure is over 140 or your diastolic blood pressure is over 90.  You have kidney disease and your blood pressure is above 140/90.  You have heart disease and your blood pressure is above 140/90. Your personal target blood pressure may vary depending on your medical conditions, your age, and other factors. HOME CARE INSTRUCTIONS    Have your blood pressure rechecked as directed by your health care provider.   Take medicines only as directed by your health care provider. Follow the directions carefully. Blood pressure medicines must be taken as prescribed. The medicine does not work as well when you skip doses. Skipping doses also puts you at risk for  problems.  Do not smoke.   Monitor your blood pressure at home as directed by your health care provider. SEEK MEDICAL CARE IF:   You think you are having a reaction to medicines taken.  You have recurrent headaches or feel dizzy.  You have swelling in your ankles.  You have trouble with your vision. SEEK IMMEDIATE MEDICAL CARE IF:  You develop a severe headache or confusion.  You have unusual weakness, numbness, or feel faint.  You have severe chest or abdominal pain.  You vomit repeatedly.  You have trouble breathing. MAKE SURE YOU:   Understand these instructions.  Will watch your condition.  Will get help right away if you are not doing well or get worse.   This information is not intended to replace advice given to you by your health care provider. Make sure you discuss any questions you have with your health care provider.   Document Released: 08/13/2005 Document Revised: 12/28/2014 Document Reviewed: 06/05/2013 Elsevier Interactive Patient Education 2016 Elsevier Inc.  

## 2015-07-11 NOTE — Progress Notes (Signed)
Pt's here f/up for amenorrhea. Pt states the she's still having sxs of pregancny.  Pt states that she having migraine that continues to bother her x2wks.pain is throbbing.  Pt requesting med refill.

## 2015-07-11 NOTE — Progress Notes (Signed)
Subjective:    Patient ID: Maria Carpenter, female    DOB: 12/19/1980, 34 y.o.   MRN: [id number]  HPI 34 year old female with a history of Type 2 DM (A1c 5.8- on diet control), Hypertension, PCOS,Obesity, SVT, ?Lupus and RA who comes in for a follow up visit. At her last visit she had complained of her BP running high at home and so HCTZ was switched to Lisinopril/HCTZ but she called in complaining of BP running low and review of her Bp logs indicate she has been hypotensive with systolics in the 90s.  She complains of still not having a period for the last 4-5 months; she has had irregular periods but not for this long even though she has PCO S. She is status post tubal ligation 6 years ago and pregnancy test both urine and serum done at the last office visit came back negative. Complains of migraine headaches which occurred 3-4 times a week and states she does have a previous history of migraines but is not on any medications.  Complains of epigastric pain which is worse when she but denies nausea, vomiting. Past Medical History  Diagnosis Date  . Diabetes mellitus     A2DM during pregnancy. now on metformin.   [name] Hypertension   . Gonorrhea 2010    treated  . Chlamydia 2012    treated  . Pregnancy induced hypertension     pre E with pregnancies  . Asthma   . Rheumatoid arthritis(714.0)   . Stroke [facility]) 2012    March 2012>right side deficit (speech, upper/ower weakness)  . SVT (supraventricular tachycardia) (HCC)   . Complication of anesthesia     " they have trouble waking me up"  . Family history of anesthesia complication     mother & daughter   . Shortness of breath   . Sleep apnea     does not use cpap  . GERD (gastroesophageal reflux disease)   . Anemia   . Lupus (HCC) 10-2013    Past Surgical History  Procedure Laterality Date  . Tubal ligation  2009  . Finger surgery  1993    left ring finger to correct bone "problem"  . Cholecystectomy  07/22/2011   Procedure: LAPAROSCOPIC CHOLECYSTECTOMY WITH INTRAOPERATIVE CHOLANGIOGRAM;  Surgeon: Kandis Cocking, MD;  Location: [facility] ORS;  Service: General;  Laterality: N/A;  . Ercp  07/24/2011    Procedure: ENDOSCOPIC RETROGRADE CHOLANGIOPANCREATOGRAPHY (ERCP);  Surgeon: Theda Belfast;  Location: [facility] ENDOSCOPY;  Service: Endoscopy;  Laterality: N/A;    Social History   Social History  . Marital Status: Married    Spouse Name: N/A  . Number of Children: N/A  . Years of Education: N/A   Occupational History  . Not on file.   Social History Main Topics  . Smoking status: Current Every Day Smoker -- 0.25 packs/day for 4 years    Types: Cigarettes  . Smokeless tobacco: Never Used  . Alcohol Use: No     Comment: socially  . Drug Use: Yes    Special: Marijuana  . Sexual Activity: Yes    Birth Control/ Protection: Other-see comments, Surgical   Other Topics Concern  . Not on file   Social History Narrative    Allergies  Allergen Reactions  . Amoxicillin Anaphylaxis and Rash    Has patient had a PCN reaction causing immediate rash, facial/tongue/throat swelling, SOB or lightheadedness with hypotension: Yes Has patient had a PCN reaction causing severe rash involving mucus  membranes or skin necrosis: Yes Has patient had a PCN reaction that required hospitalization Yes Has patient had a PCN reaction occurring within the last 10 years: No If all of the above answers are "NO", then may proceed with Cephalosporin use.   . Dilaudid [Hydromorphone Hcl] Itching    Current Outpatient Prescriptions on File Prior to Visit  Medication Sig Dispense Refill  . albuterol (PROVENTIL HFA;VENTOLIN HFA) 108 (90 BASE) MCG/ACT inhaler Inhale 2 puffs into the lungs every 6 (six) hours as needed. For wheezing    . Cholecalciferol (VITAMIN D PO) Take 1 tablet by mouth daily.    . diphenhydrAMINE (BENADRYL) 25 mg capsule Take 25 mg by mouth every 6 (six) hours as needed for itching or allergies.    . Multiple  Vitamins-Minerals (MULTIVITAMIN WITH MINERALS) tablet Take 1 tablet by mouth daily.      . propranolol (INDERAL) 10 MG tablet Take 1 tablet (10 mg total) by mouth 2 (two) times daily. 60 tablet 2  . azithromycin (ZITHROMAX) 250 MG tablet Take 1 tablet (250 mg total) by mouth daily. Take first 2 tablets together, then 1 every day until finished. (Patient not taking: Reported on 06/23/2015) 6 tablet 0  . benzonatate (TESSALON) 100 MG capsule Take 1 capsule (100 mg total) by mouth every 8 (eight) hours. (Patient not taking: Reported on 06/23/2015) 21 capsule 0  . cyclobenzaprine (FLEXERIL) 10 MG tablet Take 0.5-1 tablets (5-10 mg total) by mouth 3 (three) times daily as needed for muscle spasms. (Patient not taking: Reported on 07/11/2015) 30 tablet 1  . ibuprofen (ADVIL,MOTRIN) 600 MG tablet Take 1 tablet (600 mg total) by mouth every 6 (six) hours as needed. (Patient not taking: Reported on 06/23/2015) 30 tablet 0   No current facility-administered medications on file prior to visit.      Review of Systems  Constitutional: Negative for activity change, appetite change and fatigue.  HENT: Negative for congestion, sinus pressure and sore throat.   Eyes: Negative for visual disturbance.  Respiratory: Negative for cough, chest tightness, shortness of breath and wheezing.   Cardiovascular: Negative for chest pain and palpitations.  Gastrointestinal: Positive for abdominal pain. Negative for constipation and abdominal distention.  Endocrine: Negative for polydipsia.  Genitourinary: Positive for menstrual problem and pelvic pain. Negative for dysuria and frequency.  Musculoskeletal: Negative for back pain and arthralgias.  Skin: Negative for rash.  Neurological: Positive for headaches. Negative for tremors, light-headedness and numbness.  Hematological: Does not bruise/bleed easily.  Psychiatric/Behavioral: Negative for behavioral problems and agitation.       Objective: Filed Vitals:    07/11/15 1021  BP: 143/102  Pulse: 77  Temp: 98.3 F (36.8 C)  TempSrc: Oral  Resp: 18  Height: 5' 6.5" (1.689 m)  Weight: 346 lb (156.945 kg)  SpO2: 100%      Physical Exam  Constitutional: She is oriented to person, place, and time. She appears well-developed and well-nourished. No distress.  HENT:  Head: Normocephalic.  Right Ear: External ear normal.  Left Ear: External ear normal.  Nose: Nose normal.  Mouth/Throat: Oropharynx is clear and moist.  Eyes: Conjunctivae and EOM are normal. Pupils are equal, round, and reactive to light.  Neck: Normal range of motion. No JVD present.  Cardiovascular: Normal rate, regular rhythm, normal heart sounds and intact distal pulses.  Exam reveals no gallop.   No murmur heard. Pulmonary/Chest: Effort normal and breath sounds normal. No respiratory distress. She has no wheezes. She has no rales. She exhibits  no tenderness.  Abdominal: Soft. Bowel sounds are normal. She exhibits no distension and no mass. There is tenderness (might tender epigastrium).  Musculoskeletal: Normal range of motion. She exhibits no edema or tenderness.  Neurological: She is alert and oriented to person, place, and time. She has normal reflexes.  Skin: Skin is warm and dry. She is not diaphoretic.  Psychiatric: She has a normal mood and affect.          Assessment & Plan:  1. Type 2 diabetes mellitus  Diet controlled - Glucose (CBG) - POCT A1C- 5.8 -  2. Essential hypertension Uncontrolled. She did have some hypotension with the use of lisinopril/HCTZ 20/25 mg which I would discontinue at this time. I will place her on hydrochlorothiazide 25 mg and Lisinopril 91m Underlying sleep apnea could also explain elevated blood pressures.  3. Obesity Discussed reducing portion sizes and increasing physical activity. We will discuss further at the next office visit.  4.Migraines: Placed on Topamax due to frequency of headaches and she will use Fioricet for  breakthrough migraines.   5. Epigastric pain: Pain is only present after meals and so I will place her on a PPI. She may likely need H. pylori testing and possibly an endoscopy if symptoms persist  6. PCOS: Previously unable to tolerate metformin due to hypoglycemia. She will need a pelvic ultrasound but at this time does not have to [facility] card or [facility] discount that she is in the process of applying for and will get back to [place] once this is obtained so a pelvic ultrasound can be ordered due to persisting amenorrhea.   7. SVT (supraventricular tachycardia) (HCC) Placed on propranolol as the intermittent chest pain and she could have could be related to tachycardia from her SVT  8. Lupus (HCC)/ Rheumatoid arthritis, involving unspecified site, unspecified rheumatoid factor presence Currently not seeing rheumatology and has no recent flares. She will need to be referred to rheumatology and have explained to her the process of applying for the [facility] discount and [employer] card to enable this referral.  This note has been created with Education officer, environmental. Any transcriptional errors are unintentional.

## 2015-07-18 ENCOUNTER — Telehealth: Payer: Self-pay

## 2015-07-18 NOTE — Telephone Encounter (Signed)
-----   Message from Jaclyn Shaggy, MD sent at 07/12/2015  4:09 PM EST ----- Cholesterol is normal, HDL (good cholesterol) is lower than normal. Encourage her to increase her physical activity and exercise.

## 2015-07-18 NOTE — Telephone Encounter (Signed)
CMA called patient, patient verified name and DOB. Patient was given her lab results and verbalized that she understood with no further questions.

## 2015-08-10 ENCOUNTER — Encounter: Payer: Self-pay | Admitting: Family Medicine

## 2016-01-30 ENCOUNTER — Encounter (HOSPITAL_COMMUNITY): Payer: Self-pay | Admitting: *Deleted

## 2016-01-30 ENCOUNTER — Inpatient Hospital Stay (HOSPITAL_COMMUNITY)
Admission: AD | Admit: 2016-01-30 | Discharge: 2016-01-30 | Disposition: A | Discharge status: Home / Self Care | Payer: Self-pay | Source: Ambulatory Visit | Attending: Obstetrics & Gynecology | Admitting: Obstetrics & Gynecology

## 2016-01-30 ENCOUNTER — Inpatient Hospital Stay (HOSPITAL_COMMUNITY): Payer: Self-pay

## 2016-01-30 DIAGNOSIS — Z8673 Personal history of transient ischemic attack (TIA), and cerebral infarction without residual deficits: Secondary | ICD-10-CM | POA: Insufficient documentation

## 2016-01-30 DIAGNOSIS — I10 Essential (primary) hypertension: Secondary | ICD-10-CM

## 2016-01-30 DIAGNOSIS — N910 Primary amenorrhea: Secondary | ICD-10-CM

## 2016-01-30 DIAGNOSIS — M329 Systemic lupus erythematosus, unspecified: Secondary | ICD-10-CM | POA: Insufficient documentation

## 2016-01-30 DIAGNOSIS — R1032 Left lower quadrant pain: Secondary | ICD-10-CM

## 2016-01-30 DIAGNOSIS — F17210 Nicotine dependence, cigarettes, uncomplicated: Secondary | ICD-10-CM | POA: Insufficient documentation

## 2016-01-30 DIAGNOSIS — K219 Gastro-esophageal reflux disease without esophagitis: Secondary | ICD-10-CM | POA: Insufficient documentation

## 2016-01-30 DIAGNOSIS — I471 Supraventricular tachycardia: Secondary | ICD-10-CM | POA: Insufficient documentation

## 2016-01-30 DIAGNOSIS — E119 Type 2 diabetes mellitus without complications: Secondary | ICD-10-CM | POA: Insufficient documentation

## 2016-01-30 DIAGNOSIS — Z880 Allergy status to penicillin: Secondary | ICD-10-CM | POA: Insufficient documentation

## 2016-01-30 DIAGNOSIS — G4730 Sleep apnea, unspecified: Secondary | ICD-10-CM | POA: Insufficient documentation

## 2016-01-30 DIAGNOSIS — Z3202 Encounter for pregnancy test, result negative: Secondary | ICD-10-CM

## 2016-01-30 DIAGNOSIS — N926 Irregular menstruation, unspecified: Secondary | ICD-10-CM

## 2016-01-30 LAB — URINALYSIS, ROUTINE W REFLEX MICROSCOPIC
Bilirubin Urine: NEGATIVE
Glucose, UA: NEGATIVE mg/dL
Hgb urine dipstick: NEGATIVE
Ketones, ur: NEGATIVE mg/dL
Leukocytes, UA: NEGATIVE
Nitrite: NEGATIVE
Protein, ur: NEGATIVE mg/dL
Specific Gravity, Urine: 1.02 (ref 1.005–1.030)
pH: 6 (ref 5.0–8.0)

## 2016-01-30 LAB — CBC WITH DIFFERENTIAL/PLATELET
Basophils Absolute: 0 10*3/uL (ref 0.0–0.1)
Basophils Relative: 0 %
Eosinophils Absolute: 0.3 10*3/uL (ref 0.0–0.7)
Eosinophils Relative: 3 %
HCT: 38.4 % (ref 36.0–46.0)
Hemoglobin: 13 g/dL (ref 12.0–15.0)
Lymphocytes Relative: 30 %
Lymphs Abs: 3 10*3/uL (ref 0.7–4.0)
MCH: 28.4 pg (ref 26.0–34.0)
MCHC: 33.9 g/dL (ref 30.0–36.0)
MCV: 83.8 fL (ref 78.0–100.0)
Monocytes Absolute: 0.7 10*3/uL (ref 0.1–1.0)
Monocytes Relative: 7 %
Neutro Abs: 6 10*3/uL (ref 1.7–7.7)
Neutrophils Relative %: 60 %
Platelets: 249 10*3/uL (ref 150–400)
RBC: 4.58 MIL/uL (ref 3.87–5.11)
RDW: 15.7 % — ABNORMAL HIGH (ref 11.5–15.5)
WBC: 10 10*3/uL (ref 4.0–10.5)

## 2016-01-30 LAB — WET PREP, GENITAL
Clue Cells Wet Prep HPF POC: NONE SEEN
Sperm: NONE SEEN
Trich, Wet Prep: NONE SEEN
Yeast Wet Prep HPF POC: NONE SEEN

## 2016-01-30 LAB — POCT PREGNANCY, URINE: Preg Test, Ur: NEGATIVE

## 2016-01-30 MED ORDER — LISINOPRIL 10 MG PO TABS
10.0000 mg | ORAL_TABLET | Freq: Once | ORAL | Status: AC
  Administered 2016-01-30: 10 mg via ORAL
  Filled 2016-01-30: qty 1

## 2016-01-30 NOTE — Discharge Instructions (Signed)
Hypertension Hypertension, commonly called high blood pressure, is when the force of blood pumping through your arteries is too strong. Your arteries are the blood vessels that carry blood from your heart throughout your body. A blood pressure reading consists of a higher number over a lower number, such as 110/72. The higher number (systolic) is the pressure inside your arteries when your heart pumps. The lower number (diastolic) is the pressure inside your arteries when your heart relaxes. Ideally you want your blood pressure below 120/80. Hypertension forces your heart to work harder to pump blood. Your arteries may become narrow or stiff. Having untreated or uncontrolled hypertension can cause heart attack, stroke, kidney disease, and other problems. RISK FACTORS Some risk factors for high blood pressure are controllable. Others are not.  Risk factors you cannot control include:   Race. You may be at higher risk if you are African American.  Age. Risk increases with age.  Gender. Men are at higher risk than women before age 45 years. After age 65, women are at higher risk than men. Risk factors you can control include:  Not getting enough exercise or physical activity.  Being overweight.  Getting too much fat, sugar, calories, or salt in your diet.  Drinking too much alcohol. SIGNS AND SYMPTOMS Hypertension does not usually cause signs or symptoms. Extremely high blood pressure (hypertensive crisis) may cause headache, anxiety, shortness of breath, and nosebleed. DIAGNOSIS To check if you have hypertension, your health care provider will measure your blood pressure while you are seated, with your arm held at the level of your heart. It should be measured at least twice using the same arm. Certain conditions can cause a difference in blood pressure between your right and left arms. A blood pressure reading that is higher than normal on one occasion does not mean that you need treatment. If  it is not clear whether you have high blood pressure, you may be asked to return on a different day to have your blood pressure checked again. Or, you may be asked to monitor your blood pressure at home for 1 or more weeks. TREATMENT Treating high blood pressure includes making lifestyle changes and possibly taking medicine. Living a healthy lifestyle can help lower high blood pressure. You may need to change some of your habits. Lifestyle changes may include:  Following the DASH diet. This diet is high in fruits, vegetables, and whole grains. It is low in salt, red meat, and added sugars.  Keep your sodium intake below 2,300 mg per day.  Getting at least 30-45 minutes of aerobic exercise at least 4 times per week.  Losing weight if necessary.  Not smoking.  Limiting alcoholic beverages.  Learning ways to reduce stress. Your health care provider may prescribe medicine if lifestyle changes are not enough to get your blood pressure under control, and if one of the following is true:  You are 18-59 years of age and your systolic blood pressure is above 140.  You are 60 years of age or older, and your systolic blood pressure is above 150.  Your diastolic blood pressure is above 90.  You have diabetes, and your systolic blood pressure is over 140 or your diastolic blood pressure is over 90.  You have kidney disease and your blood pressure is above 140/90.  You have heart disease and your blood pressure is above 140/90. Your personal target blood pressure may vary depending on your medical conditions, your age, and other factors. HOME CARE INSTRUCTIONS    Have your blood pressure rechecked as directed by your health care provider.   Take medicines only as directed by your health care provider. Follow the directions carefully. Blood pressure medicines must be taken as prescribed. The medicine does not work as well when you skip doses. Skipping doses also puts you at risk for  problems.  Do not smoke.   Monitor your blood pressure at home as directed by your health care provider. SEEK MEDICAL CARE IF:   You think you are having a reaction to medicines taken.  You have recurrent headaches or feel dizzy.  You have swelling in your ankles.  You have trouble with your vision. SEEK IMMEDIATE MEDICAL CARE IF:  You develop a severe headache or confusion.  You have unusual weakness, numbness, or feel faint.  You have severe chest or abdominal pain.  You vomit repeatedly.  You have trouble breathing. MAKE SURE YOU:   Understand these instructions.  Will watch your condition.  Will get help right away if you are not doing well or get worse.   This information is not intended to replace advice given to you by your health care provider. Make sure you discuss any questions you have with your health care provider.   Document Released: 08/13/2005 Document Revised: 12/28/2014 Document Reviewed: 06/05/2013 Elsevier Interactive Patient Education 2016 Elsevier Inc. Abdominal Pain, Adult Many things can cause belly (abdominal) pain. Most times, the belly pain is not dangerous. Many cases of belly pain can be watched and treated at home. HOME CARE   Do not take medicines that help you go poop (laxatives) unless told to by your doctor.  Only take medicine as told by your doctor.  Eat or drink as told by your doctor. Your doctor will tell you if you should be on a special diet. GET HELP IF:  You do not know what is causing your belly pain.  You have belly pain while you are sick to your stomach (nauseous) or have runny poop (diarrhea).  You have pain while you pee or poop.  Your belly pain wakes you up at night.  You have belly pain that gets worse or better when you eat.  You have belly pain that gets worse when you eat fatty foods.  You have a fever. GET HELP RIGHT AWAY IF:   The pain does not go away within 2 hours.  You keep throwing up  (vomiting).  The pain changes and is only in the right or left part of the belly.  You have bloody or tarry looking poop. MAKE SURE YOU:   Understand these instructions.  Will watch your condition.  Will get help right away if you are not doing well or get worse.   This information is not intended to replace advice given to you by your health care provider. Make sure you discuss any questions you have with your health care provider.   Document Released: 01/30/2008 Document Revised: 09/03/2014 Document Reviewed: 04/22/2013 Elsevier Interactive Patient Education [employer]

## 2016-01-30 NOTE — MAU Note (Addendum)
Hx of PCOS.  No cycle since Feb.  Has not had usual symptoms that go along with that.  Had a tubal 8 yrs ago.  Pain in LLQ since mid May, initially was infrequent, now is every day and more often. Can feel a ? Knot when she lays flat.  Kids came home with sniffles, she had a fever yesterday- reports cough and sore throat

## 2016-01-30 NOTE — MAU Provider Note (Signed)
History     CSN: [id number]  Arrival date and time: 01/30/16 1347   None     Chief Complaint  Patient presents with  . Abdominal Pain   HPI Maria Carpenter is 35 y.o. [id number]  Presents with lower abdominal pain.  Began mid May occuring qod and now daily.  Shoots down her left leg and if lies on stomach it radiates to her back.  Rates pain as 10/10 at its worst and at present 4/10. Has not taken anything for pain.  Told not to take ibuprofen.   Reports 3 diarrheal stools today without fever. Children have colds but not diarrhea.  She has a knot left lower abdomen.  LMP 10/24/15.  No signs of bleeding.  Cycles can be irregular but never this far apart.  Hx of PCOS  Thinks this maybe a cyst but feels different than "PCOS" symptoms.  Hx of BTL.  Is concerned abut cancer because of family history.  She no longer qualifies for Medicaid or [employer]CA , or other insurance per her report and has been unable to have AEX.  She has multiple diagnosis including Lupus, Hypertension, diabetes, GERD,SVT. Sleep apnea, RA, Stroke 2012, morbid obesity.  Blood pressure elevated today. + for swelling in hands and feet.  She says these readings are her normal BPs.  Last seen at [facility]in Dec 2016.  BP meds changed back to Lisinopril at that time.  She reports it doesn't hold BPs for long before another med has to be added.    Past Medical History  Diagnosis Date  . Diabetes mellitus     A2DM during pregnancy. now on metformin.   [name] Hypertension   . Gonorrhea 2010    treated  . Chlamydia 2012    treated  . Pregnancy induced hypertension     pre E with pregnancies  . Asthma   . Rheumatoid arthritis(714.0)   . Stroke [facility]) 2012    March 2012>right side deficit (speech, upper/ower weakness)  . SVT (supraventricular tachycardia) (HCC)   . Complication of anesthesia     " they have trouble waking me up"  . Family history of anesthesia complication     mother & daughter   . Shortness of breath   . Sleep  apnea     does not use cpap  . GERD (gastroesophageal reflux disease)   . Anemia   . Lupus (HCC) 10-2013    Past Surgical History  Procedure Laterality Date  . Tubal ligation  2009  . Finger surgery  1993    left ring finger to correct bone "problem"  . Cholecystectomy  07/22/2011    Procedure: LAPAROSCOPIC CHOLECYSTECTOMY WITH INTRAOPERATIVE CHOLANGIOGRAM;  Surgeon: Kandis Cocking, MD;  Location: [facility] ORS;  Service: General;  Laterality: N/A;  . Ercp  07/24/2011    Procedure: ENDOSCOPIC RETROGRADE CHOLANGIOPANCREATOGRAPHY (ERCP);  Surgeon: Theda Belfast;  Location: [facility] END[facility];  Service: Endoscopy;  Laterality: N/A;    Family History  Problem Relation Age of Onset  . Anesthesia problems Mother     difficult to arouse, SOB  . Diabetes Mother   . Hypertension Mother   . Stroke Mother   . Diabetes Father   . Hypertension Father   . Heart disease Father   . Cancer Maternal Aunt   . Cancer Maternal Grandmother     Social History  Substance Use Topics  . Smoking status: Current Every Day Smoker -- 0.25 packs/day for 4 years    Types: Cigarettes  .  Smokeless tobacco: Never Used  . Alcohol Use: No     Comment: socially    Allergies:  Allergies  Allergen Reactions  . Amoxicillin Anaphylaxis and Rash    Has patient had a PCN reaction causing immediate rash, facial/tongue/throat swelling, SOB or lightheadedness with hypotension: Yes Has patient had a PCN reaction causing severe rash involving mucus membranes or skin necrosis: Yes Has patient had a PCN reaction that required hospitalization Yes Has patient had a PCN reaction occurring within the last 10 years: No If all of the above answers are "NO", then may proceed with Cephalosporin use.   . Dilaudid [Hydromorphone Hcl] Itching    Prescriptions prior to admission  Medication Sig Dispense Refill Last Dose  . albuterol (PROVENTIL HFA;VENTOLIN HFA) 108 (90 BASE) MCG/ACT inhaler Inhale 2 puffs into the lungs every 6 (six)  hours as needed. For wheezing   Past Month at Unknown time  . butalbital-acetaminophen-caffeine (FIORICET) 50-325-40 MG tablet Take 1-2 tablets by mouth every 6 (six) hours as needed for headache. 20 tablet 0 Past Month at Unknown time  . Cholecalciferol (VITAMIN D PO) Take 1 tablet by mouth daily.   01/30/2016 at Unknown time  . diphenhydrAMINE (BENADRYL) 2 % cream Apply 1 application topically 3 (three) times daily as needed for itching.   Past Week at Unknown time  . diphenhydrAMINE (BENADRYL) 25 mg capsule Take 25 mg by mouth every 6 (six) hours as needed for itching or allergies.   Past Week at Unknown time  . hydrochlorothiazide (HYDRODIURIL) 25 MG tablet Take 1 tablet (25 mg total) by mouth daily. 30 tablet 2 01/30/2016 at Unknown time  . lisinopril (PRINIVIL,ZESTRIL) 10 MG tablet Take 1 tablet (10 mg total) by mouth daily. 30 tablet 2 01/30/2016 at Unknown time  . Multiple Vitamins-Minerals (MULTIVITAMIN WITH MINERALS) tablet Take 1 tablet by mouth daily.     01/30/2016 at Unknown time  . pantoprazole (PROTONIX) 20 MG tablet Take 1 tablet (20 mg total) by mouth daily. 30 tablet 2 01/30/2016 at Unknown time  . propranolol (INDERAL) 10 MG tablet Take 1 tablet (10 mg total) by mouth 2 (two) times daily. 60 tablet 2 01/30/2016 at Unknown time    Review of Systems  Constitutional: Positive for fever (yesterday 102 degrees,). Negative for chills and malaise/fatigue.  Cardiovascular: Positive for leg swelling (and swelling in her hands). Negative for chest pain.  Gastrointestinal: Positive for abdominal pain (lower left abdominal pain) and diarrhea. Negative for nausea, vomiting and constipation.  Genitourinary: Negative for dysuria, urgency, frequency and hematuria.       Negative for vaginal discharge or bleeding.  Skin: Positive for rash (states she is having Lupus sxs that began yesterday.).  Neurological: Negative for dizziness and headaches.   Physical Exam   Blood pressure 163/116, pulse 104,  temperature 99 F (37.2 C), temperature source Oral, resp. rate 20, height 5\' 7"  (1.702 m), weight 346 lb 1.3 oz (156.981 kg), last menstrual period 10/24/2015.  Physical Exam  Nursing note and vitals reviewed. Constitutional: She is oriented to person, place, and time. She appears well-developed and well-nourished. No distress.  Morbidly obese  HENT:  Head: Normocephalic.  Neck: Normal range of motion.  Cardiovascular: Normal rate.   BP elevated  Respiratory: Effort normal.  GI: Soft. She exhibits no distension and no mass. There is tenderness (left lower abdominal tenderness). There is no rebound and no guarding.  I do not palpate a "knot"  Genitourinary: There is no rash, tenderness, lesion or injury  on the right labia. There is no rash, tenderness, lesion or injury on the left labia. Uterus is not enlarged and not tender. Cervix exhibits no motion tenderness, no discharge and no friability. Right adnexum displays no mass, no tenderness and no fullness. Left adnexum displays tenderness. Left adnexum displays no mass (mild) and no fullness. No erythema, tenderness or bleeding in the vagina. No vaginal discharge found.  Musculoskeletal: She exhibits edema (mild in hands and feet.  Neg for pitting edema).  Neurological: She is alert and oriented to person, place, and time. Coordination normal.  Skin: Skin is warm and dry.  Psychiatric: She has a normal mood and affect. Her behavior is normal.    Results for orders placed or performed during the hospital encounter of 01/30/16 (from the past 24 hour(s))  Urinalysis, Routine w reflex microscopic (not at [facility])     Status: None   Collection Time: 01/30/16  2:10 PM  Result Value Ref Range   Color, Urine YELLOW YELLOW   APPearance CLEAR CLEAR   Specific Gravity, Urine 1.020 1.005 - 1.030   pH 6.0 5.0 - 8.0   Glucose, UA NEGATIVE NEGATIVE mg/dL   Hgb urine dipstick NEGATIVE NEGATIVE   Bilirubin Urine NEGATIVE NEGATIVE   Ketones, ur NEGATIVE  NEGATIVE mg/dL   Protein, ur NEGATIVE NEGATIVE mg/dL   Nitrite NEGATIVE NEGATIVE   Leukocytes, UA NEGATIVE NEGATIVE  Pregnancy, urine POC     Status: None   Collection Time: 01/30/16  2:22 PM  Result Value Ref Range   Preg Test, Ur NEGATIVE NEGATIVE  Wet prep, genital     Status: Abnormal   Collection Time: 01/30/16  2:50 PM  Result Value Ref Range   Yeast Wet Prep HPF POC NONE SEEN NONE SEEN   Trich, Wet Prep NONE SEEN NONE SEEN   Clue Cells Wet Prep HPF POC NONE SEEN NONE SEEN   WBC, Wet Prep HPF POC FEW (A) NONE SEEN   Sperm NONE SEEN   CBC with Differential/Platelet     Status: Abnormal (Preliminary result)   Collection Time: 01/30/16  3:09 PM  Result Value Ref Range   WBC 10.0 4.0 - 10.5 K/uL   RBC 4.58 3.87 - 5.11 MIL/uL   Hemoglobin 13.0 12.0 - 15.0 g/dL   HCT 46.5 03.5 - 46.5 %   MCV 83.8 78.0 - 100.0 fL   MCH 28.4 26.0 - 34.0 pg   MCHC 33.9 30.0 - 36.0 g/dL   RDW 68.1 (H) 27.5 - 17.0 %   Platelets 249 150 - 400 K/uL   Neutrophils Relative % 60 %   Neutro Abs 6.0 1.7 - 7.7 K/uL   Lymphocytes Relative 30 %   Lymphs Abs 3.0 0.7 - 4.0 K/uL   Monocytes Relative 7 %   Monocytes Absolute 0.7 0.1 - 1.0 K/uL   Eosinophils Relative 3 %   Eosinophils Absolute 0.3 0.0 - 0.7 K/uL   Basophils Relative 0 %   Basophils Absolute 0.0 0.0 - 0.1 K/uL   Other PENDING %  US Transvaginal Non-ob  01/30/2016  CLINICAL DATA:  Left lower quadrant pain EXAM: TRANSABDOMINAL AND TRANSVAGINAL ULTRASOUND OF PELVIS TECHNIQUE: Both transabdominal and transvaginal ultrasound examinations of the pelvis were performed. Transabdominal technique was performed for global imaging of the pelvis including uterus, ovaries, adnexal regions, and pelvic cul-de-sac. It was necessary to proceed with endovaginal exam following the transabdominal exam to visualize the uterus, endometrium and ovaries. COMPARISON:  06/09/2015 FINDINGS: Uterus Measurements: 9.5 x 5.7 x 5.8  cm. No fibroids or other mass visualized.  Endometrium Thickness: 11 mm.  No focal abnormality visualized. Right ovary Measurements: 2.4 x 1.7 x 1.9 cm. Normal appearance/no adnexal mass. Left ovary Measurements: 3.2 x 1.5 x 1.8 cm. Normal appearance/no adnexal mass. Other findings No abnormal free fluid. IMPRESSION: 1. Normal pelvic sonogram. Electronically Signed   By: Signa Kell M.D.   On: 01/30/2016 16:01   US Pelvis Complete  01/30/2016  CLINICAL DATA:  Left lower quadrant pain EXAM: TRANSABDOMINAL AND TRANSVAGINAL ULTRASOUND OF PELVIS TECHNIQUE: Both transabdominal and transvaginal ultrasound examinations of the pelvis were performed. Transabdominal technique was performed for global imaging of the pelvis including uterus, ovaries, adnexal regions, and pelvic cul-de-sac. It was necessary to proceed with endovaginal exam following the transabdominal exam to visualize the uterus, endometrium and ovaries. COMPARISON:  06/09/2015 FINDINGS: Uterus Measurements: 9.5 x 5.7 x 5.8 cm. No fibroids or other mass visualized. Endometrium Thickness: 11 mm.  No focal abnormality visualized. Right ovary Measurements: 2.4 x 1.7 x 1.9 cm. Normal appearance/no adnexal mass. Left ovary Measurements: 3.2 x 1.5 x 1.8 cm. Normal appearance/no adnexal mass. Other findings No abnormal free fluid. IMPRESSION: 1. Normal pelvic sonogram. Electronically Signed   By: Signa Kell M.D.   On: 01/30/2016 16:01   MAU Course  Procedures  GC/CHL culture to lab  MDM MSE Lab Exam [place] Discussed patient's MSE with Dr. Debroah Loop.  Probably viral illness.  Hypertension needs to be reevaluated and treated.  Order given for Lisinopril 10mg  in MAU.  He suggested she call [facility] where she is an established patient for guidance in getting appt.   Rx given in MAU--Lisinopril 10mg   Assessment and Plan  A:  Left sided lower abdominal pain      Normal pelvic findings on Ultrasound      Possible gastro/viral illness      Multiple medical Dx      Hypertension-in poor  control.      Negative pregnancy test  P: Encouraged [facility]. Poehler to [facility] to ask for help in getting care     Reassured that Pelvic exam and U/S findings are WNL.       Stay well hydrated and walk for exercise.       Find ways to decrease stress     Continue to take BP med Pride Gonzales,EVE M 01/30/2016, 3:28 PM

## 2016-01-31 LAB — GC/CHLAMYDIA PROBE AMP (~~LOC~~) NOT AT ARMC
Chlamydia: NEGATIVE
Neisseria Gonorrhea: NEGATIVE

## 2016-11-22 ENCOUNTER — Emergency Department (HOSPITAL_COMMUNITY): Payer: Self-pay

## 2016-11-22 ENCOUNTER — Encounter (HOSPITAL_COMMUNITY): Payer: Self-pay | Admitting: Emergency Medicine

## 2016-11-22 ENCOUNTER — Emergency Department (HOSPITAL_COMMUNITY)
Admission: EM | Admit: 2016-11-22 | Discharge: 2016-11-23 | Disposition: A | Discharge status: Home / Self Care | Payer: Self-pay | Attending: Emergency Medicine | Admitting: Emergency Medicine

## 2016-11-22 DIAGNOSIS — G43809 Other migraine, not intractable, without status migrainosus: Secondary | ICD-10-CM | POA: Insufficient documentation

## 2016-11-22 DIAGNOSIS — E119 Type 2 diabetes mellitus without complications: Secondary | ICD-10-CM | POA: Insufficient documentation

## 2016-11-22 DIAGNOSIS — Z8673 Personal history of transient ischemic attack (TIA), and cerebral infarction without residual deficits: Secondary | ICD-10-CM | POA: Insufficient documentation

## 2016-11-22 DIAGNOSIS — R0789 Other chest pain: Secondary | ICD-10-CM | POA: Insufficient documentation

## 2016-11-22 DIAGNOSIS — R0689 Other abnormalities of breathing: Secondary | ICD-10-CM | POA: Insufficient documentation

## 2016-11-22 DIAGNOSIS — J45909 Unspecified asthma, uncomplicated: Secondary | ICD-10-CM | POA: Insufficient documentation

## 2016-11-22 DIAGNOSIS — F17210 Nicotine dependence, cigarettes, uncomplicated: Secondary | ICD-10-CM | POA: Insufficient documentation

## 2016-11-22 DIAGNOSIS — I10 Essential (primary) hypertension: Secondary | ICD-10-CM | POA: Insufficient documentation

## 2016-11-22 LAB — URINALYSIS, ROUTINE W REFLEX MICROSCOPIC
Bilirubin Urine: NEGATIVE
Glucose, UA: NEGATIVE mg/dL
Hgb urine dipstick: NEGATIVE
Ketones, ur: NEGATIVE mg/dL
Leukocytes, UA: NEGATIVE
Nitrite: NEGATIVE
Protein, ur: NEGATIVE mg/dL
Specific Gravity, Urine: 1.015 (ref 1.005–1.030)
pH: 7 (ref 5.0–8.0)

## 2016-11-22 LAB — D-DIMER, QUANTITATIVE: D-Dimer, Quant: 0.53 ug/mL-FEU — ABNORMAL HIGH (ref 0.00–0.50)

## 2016-11-22 LAB — COMPREHENSIVE METABOLIC PANEL
ALT: 15 U/L (ref 14–54)
AST: 19 U/L (ref 15–41)
Albumin: 3.8 g/dL (ref 3.5–5.0)
Alkaline Phosphatase: 81 U/L (ref 38–126)
Anion gap: 6 (ref 5–15)
BUN: 10 mg/dL (ref 6–20)
CO2: 27 mmol/L (ref 22–32)
Calcium: 8.9 mg/dL (ref 8.9–10.3)
Chloride: 105 mmol/L (ref 101–111)
Creatinine, Ser: 0.73 mg/dL (ref 0.44–1.00)
GFR calc Af Amer: >60 mL/min (ref >60)
GFR calc non Af Amer: >60 mL/min (ref >60)
Glucose, Bld: 119 mg/dL — ABNORMAL HIGH (ref 65–99)
Potassium: 3.7 mmol/L (ref 3.5–5.1)
Sodium: 138 mmol/L (ref 135–145)
Total Bilirubin: 0.3 mg/dL (ref 0.3–1.2)
Total Protein: 7.7 g/dL (ref 6.5–8.1)

## 2016-11-22 LAB — LIPASE, BLOOD: Lipase: 21 U/L (ref 11–51)

## 2016-11-22 LAB — CBC
HCT: 34.4 % — ABNORMAL LOW (ref 36.0–46.0)
Hemoglobin: 11.5 g/dL — ABNORMAL LOW (ref 12.0–15.0)
MCH: 28.5 pg (ref 26.0–34.0)
MCHC: 33.4 g/dL (ref 30.0–36.0)
MCV: 85.4 fL (ref 78.0–100.0)
Platelets: 258 10*3/uL (ref 150–400)
RBC: 4.03 MIL/uL (ref 3.87–5.11)
RDW: 15 % (ref 11.5–15.5)
WBC: 10.4 10*3/uL (ref 4.0–10.5)

## 2016-11-22 LAB — BRAIN NATRIURETIC PEPTIDE: B Natriuretic Peptide: 21.6 pg/mL (ref 0.0–100.0)

## 2016-11-22 LAB — POC URINE PREG, ED: Preg Test, Ur: NEGATIVE

## 2016-11-22 LAB — I-STAT TROPONIN, ED: Troponin i, poc: 0 ng/mL (ref 0.00–0.08)

## 2016-11-22 MED ORDER — KETOROLAC TROMETHAMINE 30 MG/ML IJ SOLN
30.0000 mg | Freq: Once | INTRAMUSCULAR | Status: AC
  Administered 2016-11-22: 30 mg via INTRAVENOUS
  Filled 2016-11-22: qty 1

## 2016-11-22 MED ORDER — SODIUM CHLORIDE 0.9 % IV BOLUS (SEPSIS)
1000.0000 mL | Freq: Once | INTRAVENOUS | Status: AC
  Administered 2016-11-22: 1000 mL via INTRAVENOUS

## 2016-11-22 MED ORDER — IOPAMIDOL (ISOVUE-370) INJECTION 76%
INTRAVENOUS | Status: AC
  Filled 2016-11-22: qty 100

## 2016-11-22 MED ORDER — DIPHENHYDRAMINE HCL 50 MG/ML IJ SOLN
25.0000 mg | Freq: Once | INTRAMUSCULAR | Status: AC
  Administered 2016-11-22: 25 mg via INTRAVENOUS
  Filled 2016-11-22: qty 1

## 2016-11-22 MED ORDER — IOPAMIDOL (ISOVUE-370) INJECTION 76%
100.0000 mL | Freq: Once | INTRAVENOUS | Status: AC | PRN
  Administered 2016-11-22: 100 mL via INTRAVENOUS

## 2016-11-22 MED ORDER — PROCHLORPERAZINE EDISYLATE 5 MG/ML IJ SOLN
10.0000 mg | Freq: Once | INTRAMUSCULAR | Status: AC
  Administered 2016-11-22: 10 mg via INTRAVENOUS
  Filled 2016-11-22: qty 2

## 2016-11-22 NOTE — Discharge Instructions (Signed)
Your work-up today is reassuring. Please follow-up with your primary care doctor for re-evaluation and ongoing management.  Return without fail for worsening symptoms, including fever, worsening pain, new numbness/weakness, difficulty breathing, passing out or any other symptoms concerning to you.

## 2016-11-22 NOTE — ED Provider Notes (Signed)
[facility]-EMERGENCY DEPT Provider Note   CSN: [id number] Arrival date & time: 11/22/16  1816     History   Chief Complaint Chief Complaint  Patient presents with  . Chest Pain    HPI Maria Carpenter is a 36 y.o. female.  HPI  36 year old female who presents with multiple complaints. 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   3 days ago has developed daily intermittent headaches. It has woken her up sometimes at night. Pain atypical of that of her typical migraine headaches. Sharp pinprick sensation lasting for seconds over right temple. No vision or speech changes. No new weakness in extremities or any numbness. No n/v. Chronically photosensitive. No URI symptoms, cough, fever or chills. Typically not worse in the morning or specific time of day. Has not taken medications for symptoms. Not associated with position. No other aggravating or alleviating symptoms.   2 weeks ago with intermittent sharp anterior chest pains. Not associated with exertion but has been feeling more fatigued and short of breath with daily activities. Mild LE edema bilaterally she states but no calf tenderness. Pain does at time seem worse with deep inspiration. No syncope or near syncope. History of DVT in post-partum period but not on anticoagulation. No recent travel or immobilization. No OCPs. No fall or trauma or strenuous activity.   Has also had intermittent abdominal pain over past several weeks, brought on by movement and position changes. Sharp. Not associated with eating. Radiates across abdomen. No n/v/d, bloody or black stools, urinary complaints, abnormal vaginal bleeding or discharge.  Past Medical History:  Diagnosis Date  . Anemia   . Asthma   . Chlamydia 2012   treated  . Complication of anesthesia    " they have trouble waking me up"  . Diabetes mellitus    A2DM during pregnancy. now on metformin.   . Family history of  anesthesia complication    mother & daughter   . GERD (gastroesophageal reflux disease)   . Gonorrhea 2010   treated  . Hypertension   . Lupus 10-2013  . Pregnancy induced hypertension    pre E with pregnancies  . Rheumatoid arthritis(714.0)   . Shortness of breath   . Sleep apnea    does not use cpap  . Stroke [facility]) 2012   March 2012>right side deficit (speech, upper/ower weakness)  . SVT (supraventricular tachycardia) [facility])     Patient Active Problem List   Diagnosis Date Noted  . SVT (supraventricular tachycardia) (HCC) 06/23/2015  . Lupus 10/25/2013  . TIA (transient ischemic attack) 07/02/2012  . Diabetes mellitus (HCC) 07/02/2012  . Hypertension 07/02/2012  . Rheumatoid arthritis (HCC) 07/02/2012  . H/O: CVA (cerebrovascular accident) 07/02/2012  . Obesity 07/02/2012  . GERD (gastroesophageal reflux disease) 07/02/2012  . Obstructive sleep apnea 07/02/2012  . CTS (carpal tunnel syndrome) 07/02/2012  . Morbid obesity (HCC) 07/22/2011  . Choledocholithiasis with acute cholecystitis 07/22/2011    Past Surgical History:  Procedure Laterality Date  . CHOLECYSTECTOMY  07/22/2011   Procedure: LAPAROSCOPIC CHOLECYSTECTOMY WITH INTRAOPERATIVE CHOLANGIOGRAM;  Surgeon: Kandis Cocking, MD;  Location: [facility] ORS;  Service: General;  Laterality: N/A;  . ERCP  07/24/2011   Procedure: ENDOSCOPIC RETROGRADE CHOLANGIOPANCREATOGRAPHY (ERCP);  Surgeon: Theda Belfast;  Location: [facility] ENDOSCOPY;  Service: Endoscopy;  Laterality: N/A;  . FINGER SURGERY  1993   left ring finger to correct bone "problem"  . TUBAL LIGATION  2009  OB History    Gravida Para Term Preterm AB Living   9 6 0 6 3 6    SAB TAB Ectopic Multiple Live Births   3 0 0 0 5       Home Medications    Prior to Admission medications   Medication Sig Start Date End Date Taking? Authorizing Provider  albuterol (PROVENTIL HFA;VENTOLIN HFA) 108 (90 BASE) MCG/ACT inhaler Inhale 2 puffs into the lungs every 6 (six) hours  as needed. For wheezing   Yes Historical Provider, MD  Cholecalciferol (VITAMIN D PO) Take 1 tablet by mouth daily.   Yes Historical Provider, MD  diphenhydrAMINE (BENADRYL) 2 % cream Apply 1 application topically 3 (three) times daily as needed for itching.   Yes Historical Provider, MD  diphenhydrAMINE (BENADRYL) 25 mg capsule Take 25 mg by mouth every 6 (six) hours as needed for itching or allergies.   Yes Historical Provider, MD  hydrochlorothiazide (HYDRODIURIL) 25 MG tablet Take 1 tablet (25 mg total) by mouth daily. 07/11/15  Yes Jaclyn Shaggy, MD  lansoprazole (PREVACID) 15 MG capsule Take 15 mg by mouth daily at 12 noon.   Yes Historical Provider, MD  lisinopril (PRINIVIL,ZESTRIL) 10 MG tablet Take 1 tablet (10 mg total) by mouth daily. 07/11/15  Yes Jaclyn Shaggy, MD  Multiple Vitamins-Minerals (MULTIVITAMIN WITH MINERALS) tablet Take 1 tablet by mouth daily.     Yes Historical Provider, MD  Probiotic Product (PROBIOTIC PO) Take 1 capsule by mouth daily.   Yes Historical Provider, MD  pantoprazole (PROTONIX) 20 MG tablet Take 1 tablet (20 mg total) by mouth daily. Patient not taking: Reported on 11/22/2016 07/11/15   Jaclyn Shaggy, MD  propranolol (INDERAL) 10 MG tablet Take 1 tablet (10 mg total) by mouth 2 (two) times daily. 06/23/15   Jaclyn Shaggy, MD    Family History Family History  Problem Relation Age of Onset  . Anesthesia problems Mother     difficult to arouse, SOB  . Diabetes Mother   . Hypertension Mother   . Stroke Mother   . Diabetes Father   . Hypertension Father   . Heart disease Father   . Cancer Maternal Aunt   . Cancer Maternal Grandmother     Social History Social History  Substance Use Topics  . Smoking status: Current Every Day Smoker    Packs/day: 0.25    Years: 4.00    Types: Cigarettes  . Smokeless tobacco: Never Used  . Alcohol use No     Comment: socially     Allergies   Amoxicillin and Dilaudid [hydromorphone hcl]   Review of  Systems Review of Systems 10/14 systems reviewed and are negative other than those stated in the HPI   Physical Exam Updated Vital Signs BP 119/65   Pulse 71   Temp 98.4 F (36.9 C) (Oral)   Resp 16   LMP 11/11/2016 Comment: Preg neg 11/22/16  SpO2 99%   Physical Exam Physical Exam  Nursing note and vitals reviewed. Constitutional: Well developed, well nourished, non-toxic, and in no acute distress Head: Normocephalic and atraumatic.  Mouth/Throat: Oropharynx is clear and moist.  Neck: Normal range of motion. Neck supple.  Cardiovascular: Normal rate and regular rhythm.   Pulmonary/Chest: Effort normal and breath sounds normal. no chest wall tenderness. Abdominal: Soft. There is no tenderness. There is no rebound and no guarding.  Musculoskeletal: Normal range of motion. no significant edema. Skin: Skin is warm and dry.  Psychiatric: Cooperative Neurological:  Alert, oriented to person,  place, time, and situation. Memory grossly in tact. Fluent speech. No dysarthria or aphasia.  Cranial nerves: VF are full.Pupils are symmetric, and reactive to light. EOMI without nystagmus. No gaze deviation. Facial muscles symmetric with activation. Sensation to light touch over face in tact bilaterally. Hearing grossly in tact. Palate elevates symmetrically. Head turn and shoulder shrug are intact. Tongue midline.  Reflexes defered.  Muscle bulk and tone normal. No pronator drift. Moves all extremities symmetrically. Sensation to light touch is in tact throughout in bilateral upper and lower extremities. Coordination reveals no dysmetria with finger to nose. Gait is narrow-based and steady. Non-ataxic.   ED Treatments / Results  Labs (all labs ordered are listed, but only abnormal results are displayed) Labs Reviewed  CBC - Abnormal; Notable for the following:       Result Value   Hemoglobin 11.5 (*)    HCT 34.4 (*)    All other components within normal limits  COMPREHENSIVE METABOLIC  PANEL - Abnormal; Notable for the following:    Glucose, Bld 119 (*)    All other components within normal limits  URINALYSIS, ROUTINE W REFLEX MICROSCOPIC - Abnormal; Notable for the following:    APPearance HAZY (*)    All other components within normal limits  D-DIMER, QUANTITATIVE (NOT AT [facility]) - Abnormal; Notable for the following:    D-Dimer, Quant 0.53 (*)    All other components within normal limits  LIPASE, BLOOD  BRAIN NATRIURETIC PEPTIDE  I-STAT TROPOININ, ED  POC URINE PREG, ED    EKG  EKG Interpretation  Date/Time:  Thursday November 22 2016 18:28:17 EDT Ventricular Rate:  102 PR Interval:    QRS Duration: 104 QT Interval:  345 QTC Calculation: 450 R Axis:   63 Text Interpretation:  Sinus tachycardia Borderline low voltage, extremity leads Baseline wander in lead(s) I II aVR aVL V3 aside from tachycardia, similar to prior EKG Confirmed by Chanette Demo MD, Natilie Krabbenhoft [id number]) on 11/22/2016 8:53:44 PM       Radiology Dg Chest 2 View  Result Date: 11/22/2016 CLINICAL DATA:  Intermittent chest pain for 2 weeks. Smoker, history of hypertension, lupus and asthma. EXAM: CHEST  2 VIEW COMPARISON:  Chest radiograph June 08, 2015 FINDINGS: Cardiomediastinal silhouette is normal. No pleural effusions or focal consolidations. Trachea projects midline and there is no pneumothorax. Soft tissue planes and included osseous structures are non-suspicious. Surgical clips in the included right abdomen compatible with cholecystectomy. Large body habitus. IMPRESSION: Normal chest. Electronically Signed   By: Awilda Metro M.D.   On: 11/22/2016 19:15   Ct Head Wo Contrast  Result Date: 11/22/2016 CLINICAL DATA:  Intermittent RIGHT-sided headache for 3 weeks, different than patient's typical headaches. History of migraines and lupus. EXAM: CT HEAD WITHOUT CONTRAST TECHNIQUE: Contiguous axial images were obtained from the base of the skull through the vertex without intravenous contrast. COMPARISON:  CT  HEAD June 09, 2015 FINDINGS: BRAIN: No intraparenchymal hemorrhage, mass effect nor midline shift. The ventricles and sulci are normal. No acute large vascular territory infarcts. No abnormal extra-axial fluid collections. Basal cisterns are patent. VASCULAR: Unremarkable. SKULL/SOFT TISSUES: No skull fracture. No significant soft tissue swelling. ORBITS/SINUSES: The included ocular globes and orbital contents are normal.The mastoid aircells and included paranasal sinuses are well-aerated. OTHER: None. IMPRESSION: Normal noncontrast CT HEAD. Electronically Signed   By: Awilda Metro M.D.   On: 11/22/2016 21:17   Ct Angio Chest Pe W And/or Wo Contrast  Result Date: 11/22/2016 CLINICAL DATA:  Acute onset of  sharp right-sided chest pain and generalized weakness. Fatigue with exertion. Initial encounter. EXAM: CT ANGIOGRAPHY CHEST WITH CONTRAST TECHNIQUE: Multidetector CT imaging of the chest was performed using the standard protocol during bolus administration of intravenous contrast. Multiplanar CT image reconstructions and MIPs were obtained to evaluate the vascular anatomy. CONTRAST:  100 mL of Isovue 370 IV contrast COMPARISON:  Chest radiograph performed earlier today at 6:50 p.m. FINDINGS: Cardiovascular:  There is no evidence of pulmonary embolus. The thoracic aorta is unremarkable in appearance. The great vessels are within normal limits. The heart is normal in size. Mediastinum/Nodes: The mediastinum is unremarkable appearance. No mediastinal lymphadenopathy is seen. No pericardial effusion is identified. The visualized portions of the thyroid gland are unremarkable. No axillary lymphadenopathy is appreciated. Lungs/Pleura: The lungs are clear bilaterally. No focal consolidation, pleural effusion or pneumothorax is seen. No masses are identified. Upper Abdomen: The visualized portions of the liver and spleen are grossly unremarkable. Musculoskeletal: No acute osseous abnormalities are identified.  The visualized musculature is unremarkable in appearance. Review of the MIP images confirms the above findings. IMPRESSION: 1. No evidence of pulmonary embolus. 2. Lungs clear bilaterally. Electronically Signed   By: Roanna Raider M.D.   On: 11/22/2016 23:21    Procedures Procedures (including critical care time)  Medications Ordered in ED Medications  iopamidol (ISOVUE-370) 76 % injection (not administered)  sodium chloride 0.9 % bolus 1,000 mL (0 mLs Intravenous Stopped 11/23/16 0013)  ketorolac (TORADOL) 30 MG/ML injection 30 mg (30 mg Intravenous Given 11/22/16 2124)  prochlorperazine (COMPAZINE) injection 10 mg (10 mg Intravenous Given 11/22/16 2124)  diphenhydrAMINE (BENADRYL) injection 25 mg (25 mg Intravenous Given 11/22/16 2124)  iopamidol (ISOVUE-370) 76 % injection 100 mL (100 mLs Intravenous Contrast Given 11/22/16 2310)     Initial Impression / Assessment and Plan / ED Course  I have reviewed the triage vital signs and the nursing notes.  Pertinent labs & imaging results that were available during my care of the patient were reviewed by me and considered in my medical decision making (see chart for details).     Presents with multiple complaints. Well appearing in ED. Vital stable. In regards to headache, atypical for migraine headaches. She has normal neuro exam and no focal deficits. CT head obtained given atypical headache to rule out mass/tumor or hemorrhage. CT visualized and shows no acute intracranial processes. No infectious symptoms or meningismus to suggest meningitis. No vision changes or temporal artery pain to suggest temporal arteritis and no major risk factor for this. Considered venous thrombosis given history of hypercoagulability but lower suspicion at this time. No focal deficits to suggest CVA.Given migraine cocktail with headache improvement and resolution. Felt appropriate for outpatient management at this time.  Abdomen soft and benign. No tenderness. Worse  or brought on with position or movement suggest MSK etiology. No additional work-up felt necessary. Chest pain atypical, not associated with exertion. Given some PE risk factors ddimer obtained and positive. Underwent CTA chest rule out PE. CTA visualized and without PE, pneumonia, edema or other acute cardiopulmonary processes. EKG is non-ishcemic and troponin negative, but she is low risk and symptoms atypical for ACS.  Felt to be ruled out for serious processes and can continue outpatient management. Strict return and follow-up instructions reviewed. She expressed understanding of all discharge instructions and felt comfortable with the plan of care.   Final Clinical Impressions(s) / ED Diagnoses   Final diagnoses:  Atypical chest pain  Other migraine without status migrainosus, not intractable  New Prescriptions Discharge Medication List as of 11/22/2016 11:57 PM       Lavera Guise, MD 11/23/16 0130

## 2016-11-22 NOTE — ED Triage Notes (Addendum)
Pt c/o intermittent sharp chest pain onset 11/05/16, weakness, fatigue with exertion, sharp right temporal head pains waking her up from sleep and occurring during day as well x 2 weeks. Sharp right abdominal pain worse with moving, pain radiates up to chest and head x 1 month. Feels like she will fall asleep or collapse when walking. No rebound tenderness. Oliguria today despite oral hydration and HCTZ, along with edema. Hx SVT, ran out of her propranolol hasn't taken in over a year. Hx lupus.

## 2016-11-22 NOTE — ED Notes (Signed)
Made two 20 G IV attempts in RAC and LAC.  RN Reita Cliche will attempt now.

## 2017-01-28 ENCOUNTER — Ambulatory Visit: Payer: Self-pay | Admitting: Family Medicine

## 2017-05-30 ENCOUNTER — Inpatient Hospital Stay (HOSPITAL_COMMUNITY)
Admission: AD | Admit: 2017-05-30 | Discharge: 2017-05-30 | Disposition: A | Discharge status: Home / Self Care | Payer: Self-pay | Source: Ambulatory Visit | Attending: Emergency Medicine | Admitting: Emergency Medicine

## 2017-05-30 ENCOUNTER — Inpatient Hospital Stay (HOSPITAL_COMMUNITY): Payer: Self-pay

## 2017-05-30 ENCOUNTER — Encounter (HOSPITAL_COMMUNITY): Payer: Self-pay | Admitting: *Deleted

## 2017-05-30 DIAGNOSIS — I69351 Hemiplegia and hemiparesis following cerebral infarction affecting right dominant side: Secondary | ICD-10-CM | POA: Insufficient documentation

## 2017-05-30 DIAGNOSIS — E119 Type 2 diabetes mellitus without complications: Secondary | ICD-10-CM | POA: Insufficient documentation

## 2017-05-30 DIAGNOSIS — F17210 Nicotine dependence, cigarettes, uncomplicated: Secondary | ICD-10-CM | POA: Insufficient documentation

## 2017-05-30 DIAGNOSIS — Z885 Allergy status to narcotic agent status: Secondary | ICD-10-CM | POA: Insufficient documentation

## 2017-05-30 DIAGNOSIS — K219 Gastro-esophageal reflux disease without esophagitis: Secondary | ICD-10-CM | POA: Insufficient documentation

## 2017-05-30 DIAGNOSIS — Z880 Allergy status to penicillin: Secondary | ICD-10-CM | POA: Insufficient documentation

## 2017-05-30 DIAGNOSIS — N939 Abnormal uterine and vaginal bleeding, unspecified: Secondary | ICD-10-CM | POA: Insufficient documentation

## 2017-05-30 DIAGNOSIS — M329 Systemic lupus erythematosus, unspecified: Secondary | ICD-10-CM | POA: Insufficient documentation

## 2017-05-30 DIAGNOSIS — Z7984 Long term (current) use of oral hypoglycemic drugs: Secondary | ICD-10-CM | POA: Insufficient documentation

## 2017-05-30 DIAGNOSIS — Z6841 Body Mass Index (BMI) 40.0 and over, adult: Secondary | ICD-10-CM | POA: Insufficient documentation

## 2017-05-30 DIAGNOSIS — R519 Headache, unspecified: Secondary | ICD-10-CM

## 2017-05-30 DIAGNOSIS — Z86718 Personal history of other venous thrombosis and embolism: Secondary | ICD-10-CM | POA: Insufficient documentation

## 2017-05-30 DIAGNOSIS — M069 Rheumatoid arthritis, unspecified: Secondary | ICD-10-CM | POA: Insufficient documentation

## 2017-05-30 DIAGNOSIS — Z833 Family history of diabetes mellitus: Secondary | ICD-10-CM | POA: Insufficient documentation

## 2017-05-30 DIAGNOSIS — G4733 Obstructive sleep apnea (adult) (pediatric): Secondary | ICD-10-CM | POA: Insufficient documentation

## 2017-05-30 DIAGNOSIS — Z8249 Family history of ischemic heart disease and other diseases of the circulatory system: Secondary | ICD-10-CM | POA: Insufficient documentation

## 2017-05-30 DIAGNOSIS — R51 Headache: Secondary | ICD-10-CM

## 2017-05-30 DIAGNOSIS — R079 Chest pain, unspecified: Secondary | ICD-10-CM | POA: Insufficient documentation

## 2017-05-30 DIAGNOSIS — I160 Hypertensive urgency: Secondary | ICD-10-CM | POA: Insufficient documentation

## 2017-05-30 DIAGNOSIS — Z823 Family history of stroke: Secondary | ICD-10-CM | POA: Insufficient documentation

## 2017-05-30 DIAGNOSIS — Z79899 Other long term (current) drug therapy: Secondary | ICD-10-CM | POA: Insufficient documentation

## 2017-05-30 DIAGNOSIS — J45909 Unspecified asthma, uncomplicated: Secondary | ICD-10-CM | POA: Insufficient documentation

## 2017-05-30 DIAGNOSIS — G4459 Other complicated headache syndrome: Secondary | ICD-10-CM | POA: Insufficient documentation

## 2017-05-30 LAB — COMPREHENSIVE METABOLIC PANEL
ALT: 13 U/L — ABNORMAL LOW (ref 14–54)
AST: 16 U/L (ref 15–41)
Albumin: 3.7 g/dL (ref 3.5–5.0)
Alkaline Phosphatase: 72 U/L (ref 38–126)
Anion gap: 7 (ref 5–15)
BUN: 10 mg/dL (ref 6–20)
CO2: 27 mmol/L (ref 22–32)
Calcium: 8.9 mg/dL (ref 8.9–10.3)
Chloride: 103 mmol/L (ref 101–111)
Creatinine, Ser: 0.66 mg/dL (ref 0.44–1.00)
GFR calc Af Amer: >60 mL/min (ref >60)
GFR calc non Af Amer: >60 mL/min (ref >60)
Glucose, Bld: 121 mg/dL — ABNORMAL HIGH (ref 65–99)
Potassium: 4 mmol/L (ref 3.5–5.1)
Sodium: 137 mmol/L (ref 135–145)
Total Bilirubin: 0.4 mg/dL (ref 0.3–1.2)
Total Protein: 7.7 g/dL (ref 6.5–8.1)

## 2017-05-30 LAB — CBC
HCT: 33.5 % — ABNORMAL LOW (ref 36.0–46.0)
Hemoglobin: 11.2 g/dL — ABNORMAL LOW (ref 12.0–15.0)
MCH: 28.1 pg (ref 26.0–34.0)
MCHC: 33.4 g/dL (ref 30.0–36.0)
MCV: 84.2 fL (ref 78.0–100.0)
Platelets: 224 10*3/uL (ref 150–400)
RBC: 3.98 MIL/uL (ref 3.87–5.11)
RDW: 15.4 % (ref 11.5–15.5)
WBC: 7.1 10*3/uL (ref 4.0–10.5)

## 2017-05-30 LAB — WET PREP, GENITAL
Clue Cells Wet Prep HPF POC: NONE SEEN
Sperm: NONE SEEN
Trich, Wet Prep: NONE SEEN
Yeast Wet Prep HPF POC: NONE SEEN

## 2017-05-30 LAB — TROPONIN I: Troponin I: <0.03 ng/mL (ref <0.03)

## 2017-05-30 LAB — HCG, SERUM, QUALITATIVE: Preg, Serum: NEGATIVE

## 2017-05-30 MED ORDER — LABETALOL HCL 5 MG/ML IV SOLN
20.0000 mg | Freq: Once | INTRAVENOUS | Status: AC
  Administered 2017-05-30: 20 mg via INTRAVENOUS
  Filled 2017-05-30: qty 4

## 2017-05-30 MED ORDER — MEGESTROL ACETATE 40 MG PO TABS
80.0000 mg | ORAL_TABLET | Freq: Once | ORAL | Status: AC
  Administered 2017-05-30: 80 mg via ORAL
  Filled 2017-05-30: qty 2

## 2017-05-30 MED ORDER — DIPHENHYDRAMINE HCL 50 MG/ML IJ SOLN
12.5000 mg | Freq: Once | INTRAMUSCULAR | Status: AC
  Administered 2017-05-30: 12.5 mg via INTRAVENOUS
  Filled 2017-05-30: qty 1

## 2017-05-30 MED ORDER — LISINOPRIL-HYDROCHLOROTHIAZIDE 10-12.5 MG PO TABS: 1.0000 | ORAL_TABLET | Freq: Every day | ORAL | 1 refills | Status: DC

## 2017-05-30 MED ORDER — MEGESTROL ACETATE 40 MG PO TABS: 80.0000 mg | ORAL_TABLET | Freq: Two times a day (BID) | ORAL | 0 refills | Status: DC

## 2017-05-30 MED ORDER — KETOROLAC TROMETHAMINE 30 MG/ML IJ SOLN
15.0000 mg | Freq: Once | INTRAMUSCULAR | Status: AC
  Administered 2017-05-30: 15 mg via INTRAVENOUS
  Filled 2017-05-30: qty 1

## 2017-05-30 MED ORDER — ASPIRIN 81 MG PO CHEW
324.0000 mg | CHEWABLE_TABLET | Freq: Once | ORAL | Status: AC
  Administered 2017-05-30: 324 mg via ORAL
  Filled 2017-05-30: qty 4

## 2017-05-30 MED ORDER — IOPAMIDOL (ISOVUE-370) INJECTION 76%
INTRAVENOUS | Status: AC
  Administered 2017-05-30: 100 mL via INTRAVENOUS
  Filled 2017-05-30: qty 100

## 2017-05-30 MED ORDER — PROCHLORPERAZINE EDISYLATE 5 MG/ML IJ SOLN
5.0000 mg | Freq: Once | INTRAMUSCULAR | Status: AC
  Administered 2017-05-30: 5 mg via INTRAVENOUS
  Filled 2017-05-30: qty 2

## 2017-05-30 NOTE — Care Management Note (Signed)
Case Management Note  CM consulted for no pcp and no ins with multiple co-morbities, HTN, lupus, RA, and DM to speak of a few.  Placed the 3 clinic options on AVS for pt to call on D/C to make an appointment for follow up.  Included pharmacy and financial counseling information.  Ernest Pine, CM with [facility] a note for follow up as well.  No further CM needs noted at this time.

## 2017-05-30 NOTE — ED Notes (Signed)
Patient transported to CT 

## 2017-05-30 NOTE — Discharge Instructions (Signed)
You are having a headache. No specific cause was found today for your headache. It may have been a migraine or other cause of headache. Stress, anxiety, fatigue, and depression are common triggers for headaches. Your headache today does not appear to be life-threatening or require hospitalization, but often the exact cause of headaches is not determined in the emergency department. Therefore, follow-up with your doctor is very important to find out what may have caused your headache, and whether or not you need any further diagnostic testing or treatment. Sometimes headaches can appear benign (not harmful), but then more serious symptoms can develop which should prompt an immediate re-evaluation by your doctor or the emergency department. °SEEK MEDICAL ATTENTION IF: °You develop possible problems with medications prescribed.  °The medications don't resolve your headache, if it recurs , or if you have multiple episodes of vomiting or can't take fluids. °You have a change from the usual headache. °RETURN IMMEDIATELY IF you develop a sudden, severe headache or confusion, become poorly responsive or faint, develop a fever above 100.4F or problem breathing, have a change in speech, vision, swallowing, or understanding, or develop new weakness, numbness, tingling, incoordination, or have a seizure. °Your caregiver has diagnosed you as having chest pain that is not specific for one problem, but does not require admission.  You are at low risk for an acute heart condition or other serious illness. Chest pain comes from many different causes.  °SEEK IMMEDIATE MEDICAL ATTENTION IF: °You have severe chest pain, especially if the pain is crushing or pressure-like and spreads to the arms, back, neck, or jaw, or if you have sweating, nausea (feeling sick to your stomach), or shortness of breath. THIS IS AN EMERGENCY. Don't wait to see if the pain will go away. Get medical help at once. Call 911 or 0 (operator). DO NOT drive  yourself to the hospital.  °Your chest pain gets worse and does not go away with rest.  °You have an attack of chest pain lasting longer than usual, despite rest and treatment with the medications your caregiver has prescribed.  °You wake from sleep with chest pain or shortness of breath.  °You feel dizzy or faint.  °You have chest pain not typical of your usual pain for which you originally saw your caregiver. ° °

## 2017-05-30 NOTE — ED Triage Notes (Signed)
Pt comes via [facility] from [facility] to be checked out for chest pain that radiates to both of her shoulders.  Hypertensive. Also had complaints of vaginal bleeding but was medically cleared by OB there.  A&O x4. Nothing given in route.

## 2017-05-30 NOTE — MAU Provider Note (Signed)
History     CSN: [id number] Arrival date and time: 05/30/17 1157  First Provider Initiated Contact with Patient 05/30/17 1226      Chief Complaint  Patient presents with  . Chest Pain  . Headache  . Vaginal Bleeding    HPI: Maria Carpenter is a 36 y.o. [phone] with PMH of SLE, HTN, T2DM, SVT, CVA, RA, postpartum DVT, asthma, who presents to MAU with several complaints, including heavy vaginal bleeding, headache, and chest pain.  She report hx of PCOS and last LMP was in June which is not unusual for her, but then her period started 5 days ago, and since Tuesday (2 days ago), they have become very heavy; reports going through about 15+ pads a day, passing large clots, and reports a "tangerine size" clot today. Felling some dizziness since yesterday. Reports she has been checking her blood sugar, which have been in the mid-100s (her DM is diet controlled).   Also having intermittent chest pain since yesterday, lasting from 20 seconds to 5 minutes. Chest pain is midsternal with radiation to the left chest and down her left arm. Associated with feeling with weak, dizzy, and difficulty catching her breath. Does not currently have chest pain.   Has had headache for a few days, unrelieved with ibuprofen. Reports seeing floaters. Reports that this headache feels different than her typical headache. Denies associated focal weakness, slurred speech, or facial droopiness. She does note that she has some mild residual right-sided weakness from her previous stroke.   BP has been uncontrolled. Reports that she is supposed to be on HCTZ and lisinopril, but has not been on her meds d/t not being able to affords them. Reports that she is also supposed to be on propranolol for hx of SVT.   Denies fever, chills, nausea, vomiting, or URI symptoms, or any other concerns.   Past obstetric history: OB History  Gravida Para Term Preterm AB Living  9 6 0 SAB TAB Ectopic Multiple Live Births  3 0 0 0  5    # Outcome Date GA Lbr Len/2nd Weight Sex Delivery Anes PTL Lv  9 Preterm 10/05/07     Vag-Spont   LIV     Birth Comments: A2DM. IOL for PreE & baby "not trhiving" at 36wks.   8 Preterm 10/18/04     Vag-Spont   LIV     Birth Comments: A2DM. IOL for DKA at 36wks.   7 Preterm 04/25/03     Vag-Spont   LIV     Birth Comments: A2DM. PreE. IOL at 35wks.   6 Preterm 12/29/01     Vag-Spont        Birth Comments: A2DM. PreE. IOL at 35wks  5 Preterm 09/21/00     Vag-Spont   LIV     Birth Comments: A2DM. SVD at 31wks. PIH. placental abruption  4 Preterm 08/28/99     Vag-Spont   LIV     Birth Comments: pre E  3 SAB           2 SAB           1 SAB               Past Medical History:  Diagnosis Date  . Anemia   . Asthma   . Chlamydia 2012   treated  . Complication of anesthesia    " they have trouble waking me up"  . Diabetes mellitus    A2DM during  pregnancy. now on metformin.   . Family history of anesthesia complication    mother & daughter   . GERD (gastroesophageal reflux disease)   . Gonorrhea 2010   treated  . Hypertension   . Lupus 10-2013  . Pregnancy induced hypertension    pre E with pregnancies  . Rheumatoid arthritis(714.0)   . Shortness of breath   . Sleep apnea    does not use cpap  . Stroke [facility]) 2012   March 2012>right side deficit (speech, upper/ower weakness)  . SVT (supraventricular tachycardia) (HCC)     Past Surgical History:  Procedure Laterality Date  . CHOLECYSTECTOMY  07/22/2011   Procedure: LAPAROSCOPIC CHOLECYSTECTOMY WITH INTRAOPERATIVE CHOLANGIOGRAM;  Surgeon: Kandis Cocking, MD;  Location: [facility] ORS;  Service: General;  Laterality: N/A;  . ERCP  07/24/2011   Procedure: ENDOSCOPIC RETROGRADE CHOLANGIOPANCREATOGRAPHY (ERCP);  Surgeon: Theda Belfast;  Location: [facility] ENDOSCOPY;  Service: Endoscopy;  Laterality: N/A;  . FINGER SURGERY  1993   left ring finger to correct bone "problem"  . TUBAL LIGATION  2009    Family History  Problem Relation  Age of Onset  . Anesthesia problems Mother        difficult to arouse, SOB  . Diabetes Mother   . Hypertension Mother   . Stroke Mother   . Diabetes Father   . Hypertension Father   . Heart disease Father   . Cancer Maternal Aunt   . Cancer Maternal Grandmother     Social History  Substance Use Topics  . Smoking status: Current Every Day Smoker    Packs/day: 0.25    Years: 4.00    Types: Cigarettes  . Smokeless tobacco: Never Used  . Alcohol use No     Comment: socially    Allergies:  Allergies  Allergen Reactions  . Amoxicillin Anaphylaxis and Rash    Has patient had a PCN reaction causing immediate rash, facial/tongue/throat swelling, SOB or lightheadedness with hypotension: Yes Has patient had a PCN reaction causing severe rash involving mucus membranes or skin necrosis: Yes Has patient had a PCN reaction that required hospitalization Yes Has patient had a PCN reaction occurring within the last 10 years: No If all of the above answers are "NO", then may proceed with Cephalosporin use.   . Dilaudid [Hydromorphone Hcl] Itching    Prescriptions Prior to Admission  Medication Sig Dispense Refill Last Dose  . albuterol (PROVENTIL HFA;VENTOLIN HFA) 108 (90 BASE) MCG/ACT inhaler Inhale 2 puffs into the lungs every 6 (six) hours as needed. For wheezing   11/22/2016 at Unknown time  . Cholecalciferol (VITAMIN D PO) Take 1 tablet by mouth daily.   Past Week at Unknown time  . diphenhydrAMINE (BENADRYL) 2 % cream Apply 1 application topically 3 (three) times daily as needed for itching.   unknown at Unknown time  . diphenhydrAMINE (BENADRYL) 25 mg capsule Take 25 mg by mouth every 6 (six) hours as needed for itching or allergies.   11/21/2016 at Unknown time  . hydrochlorothiazide (HYDRODIURIL) 25 MG tablet Take 1 tablet (25 mg total) by mouth daily. 30 tablet 2 11/22/2016 at Unknown time  . lansoprazole (PREVACID) 15 MG capsule Take 15 mg by mouth daily at 12 noon.   11/22/2016 at  Unknown time  . lisinopril (PRINIVIL,ZESTRIL) 10 MG tablet Take 1 tablet (10 mg total) by mouth daily. 30 tablet 2 11/22/2016 at Unknown time  . Multiple Vitamins-Minerals (MULTIVITAMIN WITH MINERALS) tablet Take 1 tablet by mouth daily.  Past Week at Unknown time  . pantoprazole (PROTONIX) 20 MG tablet Take 1 tablet (20 mg total) by mouth daily. (Patient not taking: Reported on 11/22/2016) 30 tablet 2 Completed Course at Unknown time  . Probiotic Product (PROBIOTIC PO) Take 1 capsule by mouth daily.   11/22/2016 at Unknown time  . propranolol (INDERAL) 10 MG tablet Take 1 tablet (10 mg total) by mouth 2 (two) times daily. 60 tablet 2 over 1 year at unknown time    Review of Systems - Negative except for what is mentioned in HPI.  Physical Exam   Blood pressure (!) 183/102, pulse (!) 110, temperature 98.3 F (36.8 C), temperature source Oral, resp. rate (!) 21, last menstrual period 05/25/2017, SpO2 99 %.  Constitutional: Obese female in no acute distress.  HENT: [place]/AT, normal oropharynx mucosa. MMM Eyes: normal conjunctivae, no scleral icterus, EOMI Cardiovascular: tachycardic, normal rhythm, no murmurs or gallops Respiratory: normal effort, lungs CTAB, wheezes or crackles  GI: Abd soft, non-tender, obese Pelvic: NEFG, moderate amount of blood in vaginal vault, coming from cervical os. NO vaginal or cervical lesions. Bimanual exam limited by body habitus. MSK: Extremities nontender, no edema, normal ROM Neurologic: Alert and oriented x 4. CN II-XII intact. Strength 5/5 other than 4+/5 on RUE and RLE (chronic). Patellar DTRs 1+ on RLE and 2+ on LLE.  Psych: Normal mood and affect Skin: warm and dry, no lesions   MAU Course  Procedures  MDM Patient seen and evaluated as above. She has multiple risk factors, presenting with HA, chest pain, and BP up to 203/118. Mildly tachycardic. Other VS wnl. Sat 100% on RA. Also vaginal bleeding likely related to PCOS and had been 4 months since LMP.  Pregnancy test is negative. ASA 325 mg and IV labetalol 20 mg given. BP improved to 161/94. EKG normal sinus w/o ischemic changes. CBC with Hgb 11.2. CMP showing normal renal function and normal LFTs. Troponin I pending.  Hypertensive urgency without signs of end-organ damage on labs. However, given her risk factors including prior CVA, SLE, and uncontrolled HTN, will transfer for further work up. Spoke with [facility] ED attending, Dr. Fredderick Phenix, who has accepted patient.  Assessment and Plan  Assessment: 1. Hypertensive urgency   2. Chest pain, unspecified type   3. Acute intractable headache, unspecified headache type     Plan: --Transfer to [facility] ED for further work up.    Degele, Kandra Nicolas, MD 05/30/2017 2:19 PM

## 2017-05-30 NOTE — ED Notes (Signed)
Bed: WA06 Expected date:  Expected time:  Means of arrival:  Comments: [facility]

## 2017-05-30 NOTE — ED Provider Notes (Signed)
[facility] DEPT Provider Note   CSN: [id number] Arrival date & time: 05/30/17  1157     History   Chief Complaint Chief Complaint  Patient presents with  . Chest Pain  . Headache  . Vaginal Bleeding    HPI Maria Carpenter is a 36 y.o. female sent for evaluation of chest pain, and headache from the maternal admissions unit at [facility] she presented there with vaginal bleeding and her female issues were worked up at that time.Maria Carpenter She was seen here earlier today for heavy vaginal bleeding. She has a past medical history of systemic lupus erythematous, hypertension, diet-controlled diabetes, SVT, previous cerebrovascular accident and history of DVT. She has been having intermittent chest pain lasting from 20 seconds to 5 minutes at a time. The chest pain radiates from her mid sternal region across left chest and she has paresthesia and shocking pain down the left arm. She has associated weakness and dizziness. She is not having any current chest pain at this time however it does come and go. Patient also complains of intermittent headaches. She has had associated floaters and has noticed weakness on her right side for several weeks. Her husband states she has been limping for the past 2 months. She describes her headache as frontal, intermittently sharp and similar to previous headache that she had when her CVA occurred in 2012.  HPI  Past Medical History:  Diagnosis Date  . Anemia   . Asthma   . Chlamydia 2012   treated  . Complication of anesthesia    " they have trouble waking me up"  . Diabetes mellitus    A2DM during pregnancy. now on metformin.   . Family history of anesthesia complication    mother & daughter   . GERD (gastroesophageal reflux disease)   . Gonorrhea 2010   treated  . Hypertension   . Lupus 10-2013  . Pregnancy induced hypertension    pre E with pregnancies  . Rheumatoid arthritis(714.0)   . Shortness of breath   . Sleep apnea    does not use  cpap  . Stroke [facility]) 2012   March 2012>right side deficit (speech, upper/ower weakness)  . SVT (supraventricular tachycardia) [facility])     Patient Active Problem List   Diagnosis Date Noted  . SVT (supraventricular tachycardia) (HCC) 06/23/2015  . Lupus 10/25/2013  . TIA (transient ischemic attack) 07/02/2012  . Diabetes mellitus (HCC) 07/02/2012  . Hypertension 07/02/2012  . Rheumatoid arthritis (HCC) 07/02/2012  . H/O: CVA (cerebrovascular accident) 07/02/2012  . Obesity 07/02/2012  . GERD (gastroesophageal reflux disease) 07/02/2012  . Obstructive sleep apnea 07/02/2012  . CTS (carpal tunnel syndrome) 07/02/2012  . Morbid obesity (HCC) 07/22/2011  . Choledocholithiasis with acute cholecystitis 07/22/2011    Past Surgical History:  Procedure Laterality Date  . CHOLECYSTECTOMY  07/22/2011   Procedure: LAPAROSCOPIC CHOLECYSTECTOMY WITH INTRAOPERATIVE CHOLANGIOGRAM;  Surgeon: Kandis Cocking, MD;  Location: [facility] ORS;  Service: General;  Laterality: N/A;  . ERCP  07/24/2011   Procedure: ENDOSCOPIC RETROGRADE CHOLANGIOPANCREATOGRAPHY (ERCP);  Surgeon: Theda Belfast;  Location[facility] ENDOSCOPY;  Service: Endoscopy;  Laterality: N/A;  . FINGER SURGERY  1993   left ring finger to correct bone "problem"  . TUBAL LIGATION  2009    OB History    Gravida Para Term Preterm AB Living   9 6 0 6 3 6    SAB TAB Ectopic Multiple Live Births   3 0 0 0 5  Home Medications    Prior to Admission medications   Medication Sig Start Date End Date Taking? Authorizing Provider  albuterol (PROVENTIL HFA;VENTOLIN HFA) 108 (90 BASE) MCG/ACT inhaler Inhale 2 puffs into the lungs every 6 (six) hours as needed. For wheezing   Yes [provider]  Cholecalciferol (VITAMIN D PO) Take 1 tablet by mouth daily.   Yes [provider]  diphenhydrAMINE (BENADRYL) 25 mg capsule Take 25 mg by mouth every 6 (six) hours as needed for itching or allergies.   Yes [provider]    ibuprofen (ADVIL,MOTRIN) 200 MG tablet Take 800 mg by mouth every 6 (six) hours as needed for moderate pain.   Yes [provider]  lansoprazole (PREVACID) 15 MG capsule Take 15 mg by mouth daily at 12 noon.   Yes [provider]  Multiple Vitamins-Minerals (MULTIVITAMIN WITH MINERALS) tablet Take 1 tablet by mouth daily.     Yes [provider]  Probiotic Product (PROBIOTIC PO) Take 1 capsule by mouth daily.   Yes [provider]  diphenhydrAMINE (BENADRYL) 2 % cream Apply 1 application topically 3 (three) times daily as needed for itching.    [provider]  hydrochlorothiazide (HYDRODIURIL) 25 MG tablet Take 1 tablet (25 mg total) by mouth daily. 07/11/15   Jaclyn Shaggy, MD  lisinopril (PRINIVIL,ZESTRIL) 10 MG tablet Take 1 tablet (10 mg total) by mouth daily. 07/11/15   Jaclyn Shaggy, MD  lisinopril-hydrochlorothiazide (PRINZIDE,ZESTORETIC) 10-12.5 MG tablet Take 1 tablet by mouth daily. 05/30/17   Arthor Captain, PA-C  megestrol (MEGACE) 40 MG tablet Take 2 tablets (80 mg total) by mouth 2 (two) times daily. 05/30/17   Degele, Kandra Nicolas, MD  propranolol (INDERAL) 10 MG tablet Take 1 tablet (10 mg total) by mouth 2 (two) times daily. 06/23/15   Jaclyn Shaggy, MD    Family History Family History  Problem Relation Age of Onset  . Anesthesia problems Mother        difficult to arouse, SOB  . Diabetes Mother   . Hypertension Mother   . Stroke Mother   . Diabetes Father   . Hypertension Father   . Heart disease Father   . Cancer Maternal Aunt   . Cancer Maternal Grandmother     Social History Social History  Substance Use Topics  . Smoking status: Current Every Day Smoker    Packs/day: 0.25    Years: 4.00    Types: Cigarettes  . Smokeless tobacco: Never Used  . Alcohol use Yes     Comment: socially     Allergies   Amoxicillin and Dilaudid [hydromorphone hcl]   Review of Systems Review of Systems  Ten systems reviewed and are  negative for acute change, except as noted in the HPI.   Physical Exam Updated Vital Signs BP (!) 147/77   Pulse 75   Temp 98.2 F (36.8 C)   Resp 12   Wt (!) 159.2 kg (351 lb)   LMP 05/25/2017   SpO2 96%   BMI 54.97 kg/m   Physical Exam  Constitutional: She is oriented to person, place, and time. She appears well-developed and well-nourished. No distress.  HENT:  Head: Normocephalic and atraumatic.  Eyes: Conjunctivae are normal. No scleral icterus.  Neck: Normal range of motion.  Cardiovascular: Normal rate, regular rhythm and normal heart sounds.  Exam reveals no gallop and no friction rub.   No murmur heard. Pulmonary/Chest: Effort normal and breath sounds normal. No respiratory distress.  Abdominal: Soft. Bowel  sounds are normal. She exhibits no distension and no mass. There is no tenderness. There is no guarding.  Neurological: She is alert and oriented to person, place, and time. She displays normal reflexes. No cranial nerve deficit or sensory deficit. She exhibits normal muscle tone. Coordination normal.  4/5  strength in the right upper and lower extremity, 5 out of 5 strength on the left upper and lower extremity. Gait deferred  Skin: Skin is warm and dry. She is not diaphoretic.  Psychiatric: Her behavior is normal.  Nursing note and vitals reviewed.    ED Treatments / Results  Labs (all labs ordered are listed, but only abnormal results are displayed) Labs Reviewed  WET PREP, GENITAL - Abnormal; Notable for the following:       Result Value   WBC, Wet Prep HPF POC FEW (*)    All other components within normal limits  CBC - Abnormal; Notable for the following:    Hemoglobin 11.2 (*)    HCT 33.5 (*)    All other components within normal limits  COMPREHENSIVE METABOLIC PANEL - Abnormal; Notable for the following:    Glucose, Bld 121 (*)    ALT 13 (*)    All other components within normal limits  TROPONIN I  HCG, SERUM, QUALITATIVE  GC/CHLAMYDIA PROBE AMP  () NOT AT [facility]    EKG  EKG Interpretation  Date/Time:  Thursday May 30 2017 15:39:06 EDT Ventricular Rate:  79 PR Interval:    QRS Duration: 116 QT Interval:  377 QTC Calculation: 433 R Axis:   60 Text Interpretation:  Sinus rhythm Nonspecific intraventricular conduction delay No significant change since last tracing Confirmed by Melene Plan [phone]) on 05/30/2017 3:42:48 PM       Radiology Dg Chest 2 View  Result Date: 05/30/2017 CLINICAL DATA:  Hypertension. EXAM: CHEST  2 VIEW COMPARISON:  11/22/2016 FINDINGS: The heart size and mediastinal contours are within normal limits. Both lungs are clear. The visualized skeletal structures are unremarkable. IMPRESSION: No active cardiopulmonary disease. Electronically Signed   By: Signa Kell M.D.   On: 05/30/2017 16:35   Ct Head Wo Contrast  Result Date: 05/30/2017 CLINICAL DATA:  Generalized headache with hypertension EXAM: CT HEAD WITHOUT CONTRAST TECHNIQUE: Contiguous axial images were obtained from the base of the skull through the vertex without intravenous contrast. COMPARISON:  November 22, 2016 and March 10, 2012 FINDINGS: Brain: The ventricles are normal in size and configuration. The left lateral ventricle is slightly larger than the right lateral ventricle, a stable finding compared to prior studies. There is no intracranial mass, hemorrhage, extra-axial fluid collection, or midline shift. Gray-white compartments appear normal. No acute infarct evident. Vascular: No hyperdense vessel.  No abnormal vascular calcification. Skull: Bony calvarium appears intact. Sinuses/Orbits: Visualized paranasal sinuses are clear. Orbits appear symmetric bilaterally. Other: Visualized mastoid air cells are clear. IMPRESSION: Study within normal limits. Electronically Signed   By: Bretta Bang III M.D.   On: 05/30/2017 16:43   Ct Angio Chest Pe W And/or Wo Contrast  Result Date: 05/30/2017 CLINICAL DATA:  Pt comes via [facility] from  [facility] to be checked out for chest pain that radiates to both of her shoulders sudden onset today. Hx asthma, DM, htn 100 cc of ISO 370 EXAM: CT ANGIOGRAPHY CHEST WITH CONTRAST TECHNIQUE: Multidetector CT imaging of the chest was performed using the standard protocol during bolus administration of intravenous contrast. Multiplanar CT image reconstructions and MIPs were obtained to evaluate the vascular  anatomy. CONTRAST:  100 cc Isovue 370 COMPARISON:  11/22/2016 FINDINGS: Cardiovascular: Pulmonary arteries are moderately well opacified. There is no acute pulmonary embolus. Small pulmonary emboli may not be detectable secondary to patient body habitus and bolus timing. The heart size is normal. No significant coronary artery calcifications. No pericardial effusion. Bovine arch anatomy. Mediastinum/Nodes: The visualized portion of the thyroid gland has a normal appearance. No mediastinal, hilar, or axillary adenopathy. Lungs/Pleura: Lungs are clear. No pleural effusion or pneumothorax. Upper Abdomen: No acute abnormality. Musculoskeletal: No chest wall abnormality. No acute or significant osseous findings. Review of the MIP images confirms the above findings. IMPRESSION: No evidence for acute cardiopulmonary abnormality. No pulmonary embolus. Electronically Signed   By: Norva Pavlov M.D.   On: 05/30/2017 17:34    Procedures Procedures (including critical care time)  Medications Ordered in ED Medications  aspirin chewable tablet 324 mg (324 mg Oral Given 05/30/17 1312)  labetalol (NORMODYNE,TRANDATE) injection 20 mg (20 mg Intravenous Given 05/30/17 1323)  megestrol (MEGACE) tablet 80 mg (80 mg Oral Given 05/30/17 1455)  iopamidol (ISOVUE-370) 76 % injection (100 mLs Intravenous Contrast Given 05/30/17 1711)  ketorolac (TORADOL) 30 MG/ML injection 15 mg (15 mg Intravenous Given 05/30/17 1824)  prochlorperazine (COMPAZINE) injection 5 mg (5 mg Intravenous Given 05/30/17 1823)  diphenhydrAMINE  (BENADRYL) injection 12.5 mg (12.5 mg Intravenous Given 05/30/17 1823)     Initial Impression / Assessment and Plan / ED Course  I have reviewed the triage vital signs and the nursing notes.  Pertinent labs & imaging results that were available during my care of the patient were reviewed by me and considered in my medical decision making (see chart for details).  Clinical Course as of May 31 46  Thu May 30, 2017  1815 I discussed the case with Dr. Jerrell Belfast. Patient has multiple risk factors for stroke. Review of her EMR shows that her previous CVA or TIA was ultimately thought to be complicated migraine. Given the physical exam findings of unilateral weakness on my examination Dr. Jerrell Belfast recommends treatment for migraine headache with a cocktail and +/-MRI to rule out stroke if symptoms are not resolving. I also placed consult to case manager to begin the process of trying to establish outpatient care for this patient to his multiple comorbidities.  [AH]    Clinical Course User Index [AH] Arthor Captain, PA-C   CT angiogram of the chest negative for pulmonary embolus The patient's symptoms improved markedly over course of her ER visit with complete resolution of headache and weakness after migraine cocktail treatment. Patient does not want to have an MRI at this time. I discussed return precautions with the patient. Patient will be started on her antihypertensive medications. These will be from the $4 list at [facility]. I will also spoken with Marylene Land her case manager has given the patient referrals for outpatient clinic follow-up. Patient appears safe for discharge at this time. Discussed return precautions  Final Clinical Impressions(s) / ED Diagnoses   Final diagnoses:  Chest pain, unspecified type  Acute intractable headache, unspecified headache type  Hypertensive urgency  Complicated headache syndromes    New Prescriptions Discharge Medication List as of 05/30/2017  6:57 PM    START  taking these medications   Details  lisinopril-hydrochlorothiazide (PRINZIDE,ZESTORETIC) 10-12.5 MG tablet Take 1 tablet by mouth daily., Starting Thu 05/30/2017, Print    megestrol (MEGACE) 40 MG tablet Take 2 tablets (80 mg total) by mouth 2 (two) times daily., Starting Thu 05/30/2017, Normal  Arthor Captain, PA-C 05/31/17 0047    Melene Plan, DO 05/31/17 [phone]

## 2017-05-30 NOTE — MAU Note (Addendum)
+  left sided chest pain Rating pain 3/10 Started this morning Intermittent Radiates to left arm Denies feeling like other chest pain episodes in the past  +vaginal bleeding with clots--tangerine in size Started Saturday night States no LMP since June; PCOS  +headache Anterior Rating 4/10 Dull in nature  States has elevated BP but out of medication (HCTZ and Linsinopril)

## 2017-05-30 NOTE — ED Notes (Signed)
Bed: FA21 Expected date:  Expected time:  Means of arrival:  Comments: Rm 6

## 2017-05-30 NOTE — MAU Provider Note (Signed)
  ADDENDUM TO PRIOR NOTE: I have prescribed megace 80 mg BID for patient heavy menstrual bleeding. First dose given here. Will have patient follow up at [facility] in 2 weeks.   Raynelle Fanning P. Enslie Sahota, MD OB Fellow 05/30/2017  2:58 PM

## 2017-05-31 LAB — GC/CHLAMYDIA PROBE AMP (~~LOC~~) NOT AT ARMC
Chlamydia: NEGATIVE
Neisseria Gonorrhea: NEGATIVE

## 2017-06-03 ENCOUNTER — Telehealth: Payer: Self-pay

## 2017-06-03 NOTE — Telephone Encounter (Signed)
Message received from Eldridge Abrahams, RN CM requesting hospital follow up appointment at [facility] for the patient.  Call placed to the patient #[phone] (M) to discuss scheduling a follow up appointment and a HIPAA compliant voicemail message was left requesting a call back to # [phone]. The patient was given contact information on her AVS for [facility], [facility] and [facility] as there may not be an appointment available at [facility] when/if she returns the call.   Update provided to Breck Coons, RN CM

## 2017-07-03 ENCOUNTER — Ambulatory Visit (INDEPENDENT_AMBULATORY_CARE_PROVIDER_SITE_OTHER): Payer: Self-pay | Admitting: Obstetrics & Gynecology

## 2017-07-03 ENCOUNTER — Encounter: Payer: Self-pay | Admitting: Family Medicine

## 2017-07-03 ENCOUNTER — Ambulatory Visit (INDEPENDENT_AMBULATORY_CARE_PROVIDER_SITE_OTHER): Payer: Self-pay | Admitting: Clinical

## 2017-07-03 VITALS — BP 138/95 | HR 100 | Ht 67.0 in | Wt 358.0 lb

## 2017-07-03 DIAGNOSIS — Z3202 Encounter for pregnancy test, result negative: Secondary | ICD-10-CM

## 2017-07-03 DIAGNOSIS — F32A Depression, unspecified: Secondary | ICD-10-CM

## 2017-07-03 DIAGNOSIS — F329 Major depressive disorder, single episode, unspecified: Secondary | ICD-10-CM

## 2017-07-03 DIAGNOSIS — F4323 Adjustment disorder with mixed anxiety and depressed mood: Secondary | ICD-10-CM

## 2017-07-03 DIAGNOSIS — N939 Abnormal uterine and vaginal bleeding, unspecified: Secondary | ICD-10-CM

## 2017-07-03 LAB — POCT PREGNANCY, URINE: Preg Test, Ur: NEGATIVE

## 2017-07-03 MED ORDER — MEGESTROL ACETATE 40 MG PO TABS: 80.0000 mg | ORAL_TABLET | Freq: Two times a day (BID) | ORAL | 7 refills | Status: DC

## 2017-07-03 NOTE — Progress Notes (Signed)
Patient verbally consented to meet with [facility] Clinician about presenting concerns. Dr Macon Large aware.

## 2017-07-03 NOTE — BH Specialist Note (Signed)
Integrated Behavioral Health Initial Visit  MRN: [id number] Name: Maria Carpenter  Number of Integrated Behavioral Health Clinician visits:: 1/6 Session Start time: 4:00 PM   Session End time: 5:01 PM  Total time: 1 hour  Type of Service: Integrated Behavioral Health- Individual/Family Interpretor:No. Interpretor Name and Language: n/a   Warm Hand Off Completed.       SUBJECTIVE: Maria Carpenter is a 36 y.o. female accompanied by n/a Patient was referred by Dr Rachelle Hora for Anxiety, depression. Patient reports the following symptoms/concerns: Pt states her primary concern today is feeling overwhelmed with her children's mental health and behavioral issues. Pt states she is not suicidal, but has been feeling depressed and overly worried and anxious about what will happen to her children. Pt is very open to learning self-coping strategies to begin taking better care of her health. Duration of problem: Less than one month; Severity of problem: severe  OBJECTIVE: Mood: Anxious and Depressed and Affect: Tearful Risk of harm to self or others: Suicidal ideation No plan to harm self or others Fleeting thought that scared her, and she "snapped out of it quickly" over one week ago.   LIFE CONTEXT: Family and Social: Pt lives with husband and some of her 6 children. Oldest is living with family; working on finding placement for second child. CPS was involved, after oldest two were deemed a threat to the younger children, and pt is devastated. There is a family hx of schizophrenia in her husband's family, and causing pt to fear for her children. Youngest child (9yo)  Is autistic.  School/Work: - Self-Care: Recognizing a need for greater self-care Life Changes: Changes with children affecting the household (see family section above)  GOALS ADDRESSED: Patient will: 1. Reduce symptoms of: anxiety, depression and stress 2. Increase knowledge and/or ability of: stress reduction   3. Demonstrate ability to: Increase healthy adjustment to current life circumstances and Increase adequate support systems for patient/family  INTERVENTIONS: Interventions utilized: Mindfulness or Management consultant, Psychoeducation and/or Health Education and Link to [employer]  Standardized Assessments completed: GAD-7 and PHQ 2&9 with C-SSRS  ASSESSMENT: Patient currently experiencing Adjustment disorder with mixed anxiety and depressed mood.   Patient may benefit from psychoeducation and brief therapeutic intervention regarding coping with symptoms of anxiety and depression, along with community resources.  PLAN: 1. Follow up with behavioral health clinician on : Two weeks, or as needed 2. Behavioral recommendations:  -CALM relaxation breathing exercise before waking children for school daily; as needed throughout the day -Re-establish with PCP at [facility] asap; reapply for orange card with financial counseling at [facility] -Continue using sleep sounds at bedtime nightly -Contact [facility] to enroll in Family-to-Family Classes and support group asap -Consider calming apps; share with children  -Read educational material regarding coping with symptoms of anxiety and depression with panic attacks 3. Referral(s): Integrated Art gallery manager (In Clinic) and [employer]:  [facility], [employer] 4. "From scale of 1-10, how likely are you to follow plan?": 8  Rae Lips, LCSWA  Depression screen [facility] 2/9 07/03/2017 07/11/2015 07/11/2015 06/23/2015 06/23/2015  Decreased Interest 3 0 0 2 1  Down, Depressed, Hopeless 3 0 - 2 1  PHQ - 2 Score 6 0 0 4 2  Altered sleeping 3 - - 3 -  Tired, decreased energy 3 - - 3 -  Change in appetite 2 - - 3 -  Feeling bad or failure about yourself  3 - - 3 -  Trouble concentrating 2 - -  2 -  Moving slowly or fidgety/restless 2 - - 0 -  Suicidal thoughts 1 - - 1 -  PHQ-9 Score 22 - - 19 -   GAD 7 : Generalized Anxiety Score  07/03/2017  Nervous, Anxious, on Edge 3  Control/stop worrying 3  Worry too much - different things 3  Trouble relaxing 2  Restless 2  Easily annoyed or irritable 1  Afraid - awful might happen 3  Total GAD 7 Score 17

## 2017-07-03 NOTE — Addendum Note (Signed)
Addended by: Ossiel Marchio L on: 07/03/2017 04:43 PM   Modules accepted: Orders  

## 2017-07-03 NOTE — Progress Notes (Signed)
GYNECOLOGY OFFICE VISIT NOTE  History:  36 y.o. [phone] with PMH of morbid obesity, SLE, HTN, T2DM, SVT, CVA, RA, postpartum DVT, asthma here today for follow up evaluation for AUB. Was seen in [facility] on 05/30/17 and was given Megace 80 mg po bid. She ran out of this medication and had some bleeding, but no current bleeding or pain. No symptoms of anemia. No other GYN symptoms.  She has a lot of depression and anxiety about family issues, desires to talk to [facility] Counselor today.  Past Medical History:  Diagnosis Date  . Anemia   . Asthma   . Chlamydia 2012   treated  . Complication of anesthesia    " they have trouble waking me up"  . Diabetes mellitus    A2DM during pregnancy. now on metformin.   . Family history of anesthesia complication    mother & daughter   . GERD (gastroesophageal reflux disease)   . Gonorrhea 2010   treated  . Hypertension   . Lupus 10-2013  . Morbid obesity (HCC) 07/22/2011  . Pregnancy induced hypertension    pre E with pregnancies  . Rheumatoid arthritis(714.0)   . Shortness of breath   . Sleep apnea    does not use cpap  . Stroke [facility]) 2012   March 2012>right side deficit (speech, upper/ower weakness)  . SVT (supraventricular tachycardia) (HCC)     Past Surgical History:  Procedure Laterality Date  . FINGER SURGERY  1993   left ring finger to correct bone "problem"  . TUBAL LIGATION  2009    The following portions of the patient's history were reviewed and updated as appropriate: allergies, current medications, past family history, past medical history, past social history, past surgical history and problem list.   Health Maintenance:  Normal pap several years ago.  Review of Systems:  Pertinent items noted in HPI and remainder of comprehensive ROS otherwise negative.  Objective:  Physical Exam BP (!) 138/95   Pulse 100   Ht 5\' 7"  (1.702 m)   Wt (!) 358 lb (162.4 kg)   BMI 56.07 kg/m  CONSTITUTIONAL: Well-developed, well-nourished  female in no acute distress.  HENT:  Normocephalic, atraumatic. External right and left ear normal. Oropharynx is clear and moist EYES: Conjunctivae and EOM are normal. Pupils are equal, round, and reactive to light. No scleral icterus.  NECK: Normal range of motion, supple, no masses SKIN: Skin is warm and dry. No rash noted. Not diaphoretic. No erythema. No pallor. NEUROLOGIC: Alert and oriented to person, place, and time. Normal reflexes, muscle tone coordination. No cranial nerve deficit noted. PSYCHIATRIC: Normal mood and affect. Normal behavior. Normal judgment and thought content. CARDIOVASCULAR: Normal heart rate noted RESPIRATORY: Effort and breath sounds normal, no problems with respiration noted ABDOMEN: Soft, obese, no distention noted.   PELVIC: Normal appearing external genitalia; small sebaceous cyst son labia. Patient deferred speculum exam for now. MUSCULOSKELETAL: Normal range of motion. No edema noted.  Labs and Imaging No results found.  Assessment & Plan:  1. Abnormal uterine bleeding (AUB) Discussed etiologies of abnormal uterine bleeding in the setting of morbid obesity, concern about precancerous/hyperplasia or cancerous etiology (5 to 10% percent of cases).  Also discussed role of unopposed estrogen exposure in leading to thickened or proliferative endometrium; and its possible correlation with endometrial hyperplasia/carcinoma.  Discussed that obesity is linked to endometrial pathology given that adipose cells produce extra estrogen (estrone) which can cause the endometrium to have a significant amount of estrogen  exposure and lead to increased endometrial thickness and shedding.  Recommended endometrial biopsy and pap smear, she wants to defer this until next visit after premedication (worried about pain during biopsy).  Will order other evaluation studies for now.  - US PELVIC COMPLETE WITH TRANSVAGINAL; Future - TSH - CBC - megestrol (MEGACE) 40 MG tablet; Take 2  tablets (80 mg total) 2 (two) times daily by mouth.  Dispense: 120 tablet; Refill: 7 Will discuss long term management of AUB after all results are obtained.  2. Depression, unspecified depression type High PHQ-9 score. Patient verbally consented to meet with [facility] Clinician about presenting concerns. - Ambulatory referral to Integrated Behavioral Health  Please refer to After Visit Summary for other counseling recommendations.   Return in about 2 weeks (around 07/17/2017) for Discuss results, do endometrial biopsy and pap smear for AUB.  Total face-to-face time with patient: 25 minutes.  Over 50% of encounter was spent on counseling and coordination of care.   Jaynie Collins, MD, FACOG Attending Obstetrician & Gynecologist, [facility] for [employer], [facility]

## 2017-07-03 NOTE — Patient Instructions (Signed)
Dysfunctional Uterine Bleeding °Dysfunctional uterine bleeding is abnormal bleeding from the uterus. Dysfunctional uterine bleeding includes: °· A period that comes earlier or later than usual. °· A period that is lighter, heavier, or has blood clots. °· Bleeding between periods. °· Skipping one or more periods. °· Bleeding after sexual intercourse. °· Bleeding after menopause. ° °Follow these instructions at home: °Pay attention to any changes in your symptoms. Follow these instructions to help with your condition: °Eating and drinking °· Eat well-balanced meals. Include foods that are high in iron, such as liver, meat, shellfish, green leafy vegetables, and eggs. °· If you become constipated: °? Drink plenty of water. °? Eat fruits and vegetables that are high in water and fiber, such as spinach, carrots, raspberries, apples, and mango. °Medicines °· Take over-the-counter and prescription medicines only as told by your health care provider. °· Do not change medicines without talking with your health care provider. °· Aspirin or medicines that contain aspirin may make the bleeding worse. Do not take those medicines: °? During the week before your period. °? During your period. °· If you were prescribed iron pills, take them as told by your health care provider. Iron pills help to replace iron that your body loses because of this condition. °Activity °· If you need to change your sanitary pad or tampon more than one time every 2 hours: °? Lie in bed with your feet raised (elevated). °? Place a cold pack on your lower abdomen. °? Rest as much as possible until the bleeding stops or slows down. °· Do not try to lose weight until the bleeding has stopped and your blood iron level is back to normal. °Other Instructions °· For two months, write down: °? When your period starts. °? When your period ends. °? When any abnormal bleeding occurs. °? What problems you notice. °· Keep all follow up visits as told by your health  care provider. This is important. °Contact a health care provider if: °· You get light-headed or weak. °· You have nausea and vomiting. °· You cannot eat or drink without vomiting. °· You feel dizzy or have diarrhea while you are taking medicines. °· You are taking birth control pills or hormones, and you want to change them or stop taking them. °Get help right away if: °· You develop a fever or chills. °· You need to change your sanitary pad or tampon more than one time per hour. °· Your bleeding becomes heavier, or your flow contains clots more often. °· You develop pain in your abdomen. °· You lose consciousness. °· You develop a rash. °This information is not intended to replace advice given to you by your health care provider. Make sure you discuss any questions you have with your health care provider. °Document Released: 08/10/2000 Document Revised: 01/19/2016 Document Reviewed: 11/08/2014 °Elsevier Interactive Patient Education © 2018 [employer] ° °

## 2017-07-04 ENCOUNTER — Encounter: Payer: Self-pay | Admitting: Obstetrics & Gynecology

## 2017-07-04 LAB — CBC
Hematocrit: 35 % (ref 34.0–46.6)
Hemoglobin: 11.6 g/dL (ref 11.1–15.9)
MCH: 27.2 pg (ref 26.6–33.0)
MCHC: 33.1 g/dL (ref 31.5–35.7)
MCV: 82 fL (ref 79–97)
Platelets: 298 10*3/uL (ref 150–379)
RBC: 4.27 x10E6/uL (ref 3.77–5.28)
RDW: 16.3 % — ABNORMAL HIGH (ref 12.3–15.4)
WBC: 10.5 10*3/uL (ref 3.4–10.8)

## 2017-07-04 LAB — TSH: TSH: 0.685 u[IU]/mL (ref 0.450–4.500)

## 2017-07-11 ENCOUNTER — Ambulatory Visit (HOSPITAL_COMMUNITY)
Admission: RE | Admit: 2017-07-11 | Discharge: 2017-07-11 | Disposition: A | Discharge status: Home / Self Care | Payer: Self-pay | Source: Ambulatory Visit | Attending: Obstetrics & Gynecology | Admitting: Obstetrics & Gynecology

## 2017-07-11 DIAGNOSIS — N939 Abnormal uterine and vaginal bleeding, unspecified: Secondary | ICD-10-CM | POA: Insufficient documentation

## 2017-07-25 ENCOUNTER — Telehealth: Payer: Self-pay | Admitting: Family Medicine

## 2017-07-25 ENCOUNTER — Other Ambulatory Visit: Payer: Self-pay | Admitting: Internal Medicine

## 2017-07-25 ENCOUNTER — Ambulatory Visit (INDEPENDENT_AMBULATORY_CARE_PROVIDER_SITE_OTHER): Payer: Self-pay | Admitting: Obstetrics & Gynecology

## 2017-07-25 ENCOUNTER — Encounter: Payer: Self-pay | Admitting: Obstetrics & Gynecology

## 2017-07-25 ENCOUNTER — Other Ambulatory Visit (HOSPITAL_COMMUNITY)
Admission: RE | Admit: 2017-07-25 | Discharge: 2017-07-25 | Disposition: A | Discharge status: Home / Self Care | Payer: Self-pay | Source: Ambulatory Visit | Attending: Obstetrics & Gynecology | Admitting: Obstetrics & Gynecology

## 2017-07-25 VITALS — BP 190/111 | HR 102 | Wt 358.3 lb

## 2017-07-25 DIAGNOSIS — N939 Abnormal uterine and vaginal bleeding, unspecified: Secondary | ICD-10-CM

## 2017-07-25 DIAGNOSIS — Z113 Encounter for screening for infections with a predominantly sexual mode of transmission: Secondary | ICD-10-CM

## 2017-07-25 DIAGNOSIS — Z3202 Encounter for pregnancy test, result negative: Secondary | ICD-10-CM

## 2017-07-25 DIAGNOSIS — Z1151 Encounter for screening for human papillomavirus (HPV): Secondary | ICD-10-CM

## 2017-07-25 DIAGNOSIS — Z124 Encounter for screening for malignant neoplasm of cervix: Secondary | ICD-10-CM

## 2017-07-25 LAB — POCT PREGNANCY, URINE: Preg Test, Ur: NEGATIVE

## 2017-07-25 MED ORDER — LISINOPRIL-HYDROCHLOROTHIAZIDE 10-12.5 MG PO TABS: 1.0000 | ORAL_TABLET | Freq: Every day | ORAL | 0 refills | Status: DC

## 2017-07-25 NOTE — Progress Notes (Signed)
   Reason for visit: Endometrial biopsy and pap smear for evaluation of AUB  ENDOMETRIAL BIOPSY     The indications for endometrial biopsy were reviewed.   Risks of the biopsy including cramping, bleeding, infection, uterine perforation, inadequate specimen and need for additional procedures  were discussed. The patient states she understands and agrees to undergo procedure today. Consent was signed. Time out was performed. Urine HCG was negative. A speculum was placed in the vagina, the cervix was visualized and pap smear was obtained. The cervix was prepped with Betadine. The 3 mm pipelle was introduced into the endometrial cavity without difficulty to a depth of 10 cm, and a moderate amount of tissue was obtained and sent to pathology. The instruments were removed from the patient's vagina. Minimal bleeding from the cervix was noted. The patient tolerated the procedure well. Routine post-procedure instructions were given to the patient.    Patient needs to follow up with PCP for management of BP issues (BP 190/111 today) and anxiety; also will get referred to [facility] for evaluation and management of right breast lump.   Jaynie Collins, MD, FACOG Attending Obstetrician & Gynecologist, [facility] for [employer], [facility]

## 2017-07-25 NOTE — Patient Instructions (Signed)
Return to clinic for any scheduled appointments or obstetric concerns, or go to [facility] for evaluation  

## 2017-07-25 NOTE — Telephone Encounter (Signed)
Pt came to office to request a refill for propranolol (INDERAL) 10 MG tablet  lisinopril-hydrochlorothiazide (PRINZIDE,ZESTORETIC) 10-12.5 MG tablet She wen tot [facility] today and was told to come here to request a refill  If you can auth a 30days supplies until see need the provider , please call pt, please sent it to [facility] pharmacy

## 2017-07-25 NOTE — Progress Notes (Signed)
Pt declines Integrated Behavioral Health.

## 2017-07-25 NOTE — Telephone Encounter (Signed)
Has not been seen here since 2016. Will forward to Dr. Laural Benes who patient is establishing care with.

## 2017-07-26 ENCOUNTER — Telehealth: Payer: Self-pay | Admitting: Internal Medicine

## 2017-07-26 NOTE — Telephone Encounter (Signed)
I called pt to informed her that her request for medication refill was denied since last time saw the provider was over a year ago, she already has an appt set up for 08/13/17, to get her med refill

## 2017-07-29 LAB — CYTOLOGY - PAP
Candida vaginitis: NEGATIVE
Chlamydia: NEGATIVE
Diagnosis: NEGATIVE
HPV: NOT DETECTED
Neisseria Gonorrhea: NEGATIVE
Trichomonas: NEGATIVE

## 2017-08-13 ENCOUNTER — Ambulatory Visit: Payer: Self-pay | Attending: Internal Medicine | Admitting: Internal Medicine

## 2017-08-13 ENCOUNTER — Encounter: Payer: Self-pay | Admitting: Internal Medicine

## 2017-08-13 VITALS — BP 149/96 | HR 96 | Temp 99.5°F | Resp 16 | Wt 361.8 lb

## 2017-08-13 DIAGNOSIS — Z8739 Personal history of other diseases of the musculoskeletal system and connective tissue: Secondary | ICD-10-CM | POA: Insufficient documentation

## 2017-08-13 DIAGNOSIS — Z809 Family history of malignant neoplasm, unspecified: Secondary | ICD-10-CM | POA: Insufficient documentation

## 2017-08-13 DIAGNOSIS — G4733 Obstructive sleep apnea (adult) (pediatric): Secondary | ICD-10-CM | POA: Insufficient documentation

## 2017-08-13 DIAGNOSIS — F17210 Nicotine dependence, cigarettes, uncomplicated: Secondary | ICD-10-CM | POA: Insufficient documentation

## 2017-08-13 DIAGNOSIS — Z6841 Body Mass Index (BMI) 40.0 and over, adult: Secondary | ICD-10-CM | POA: Insufficient documentation

## 2017-08-13 DIAGNOSIS — I471 Supraventricular tachycardia: Secondary | ICD-10-CM | POA: Insufficient documentation

## 2017-08-13 DIAGNOSIS — Z9889 Other specified postprocedural states: Secondary | ICD-10-CM | POA: Insufficient documentation

## 2017-08-13 DIAGNOSIS — Z9851 Tubal ligation status: Secondary | ICD-10-CM | POA: Insufficient documentation

## 2017-08-13 DIAGNOSIS — Z833 Family history of diabetes mellitus: Secondary | ICD-10-CM | POA: Insufficient documentation

## 2017-08-13 DIAGNOSIS — G459 Transient cerebral ischemic attack, unspecified: Secondary | ICD-10-CM | POA: Insufficient documentation

## 2017-08-13 DIAGNOSIS — Z8673 Personal history of transient ischemic attack (TIA), and cerebral infarction without residual deficits: Secondary | ICD-10-CM | POA: Insufficient documentation

## 2017-08-13 DIAGNOSIS — E119 Type 2 diabetes mellitus without complications: Secondary | ICD-10-CM | POA: Insufficient documentation

## 2017-08-13 DIAGNOSIS — Z8249 Family history of ischemic heart disease and other diseases of the circulatory system: Secondary | ICD-10-CM | POA: Insufficient documentation

## 2017-08-13 DIAGNOSIS — IMO0002 Reserved for concepts with insufficient information to code with codable children: Secondary | ICD-10-CM

## 2017-08-13 DIAGNOSIS — M329 Systemic lupus erythematosus, unspecified: Secondary | ICD-10-CM

## 2017-08-13 DIAGNOSIS — Z885 Allergy status to narcotic agent status: Secondary | ICD-10-CM | POA: Insufficient documentation

## 2017-08-13 DIAGNOSIS — I10 Essential (primary) hypertension: Secondary | ICD-10-CM | POA: Insufficient documentation

## 2017-08-13 DIAGNOSIS — K219 Gastro-esophageal reflux disease without esophagitis: Secondary | ICD-10-CM | POA: Insufficient documentation

## 2017-08-13 DIAGNOSIS — R1010 Upper abdominal pain, unspecified: Secondary | ICD-10-CM

## 2017-08-13 DIAGNOSIS — Z880 Allergy status to penicillin: Secondary | ICD-10-CM | POA: Insufficient documentation

## 2017-08-13 DIAGNOSIS — G5600 Carpal tunnel syndrome, unspecified upper limb: Secondary | ICD-10-CM | POA: Insufficient documentation

## 2017-08-13 DIAGNOSIS — N6320 Unspecified lump in the left breast, unspecified quadrant: Secondary | ICD-10-CM | POA: Insufficient documentation

## 2017-08-13 DIAGNOSIS — Z794 Long term (current) use of insulin: Secondary | ICD-10-CM | POA: Insufficient documentation

## 2017-08-13 DIAGNOSIS — Z79899 Other long term (current) drug therapy: Secondary | ICD-10-CM | POA: Insufficient documentation

## 2017-08-13 DIAGNOSIS — Z9049 Acquired absence of other specified parts of digestive tract: Secondary | ICD-10-CM | POA: Insufficient documentation

## 2017-08-13 DIAGNOSIS — Z823 Family history of stroke: Secondary | ICD-10-CM | POA: Insufficient documentation

## 2017-08-13 DIAGNOSIS — Z803 Family history of malignant neoplasm of breast: Secondary | ICD-10-CM | POA: Insufficient documentation

## 2017-08-13 LAB — GLUCOSE, POCT (MANUAL RESULT ENTRY): POC Glucose: 136 mg/dl — AB (ref 70–99)

## 2017-08-13 LAB — POCT GLYCOSYLATED HEMOGLOBIN (HGB A1C): Hemoglobin A1C: 6.6

## 2017-08-13 MED ORDER — PROPRANOLOL HCL 10 MG PO TABS: 10.0000 mg | ORAL_TABLET | Freq: Two times a day (BID) | ORAL | 5 refills | Status: DC

## 2017-08-13 MED ORDER — OMEPRAZOLE 20 MG PO CPDR: 20.0000 mg | DELAYED_RELEASE_CAPSULE | Freq: Every day | ORAL | 3 refills | Status: DC

## 2017-08-13 MED ORDER — LISINOPRIL-HYDROCHLOROTHIAZIDE 10-12.5 MG PO TABS: 1.0000 | ORAL_TABLET | Freq: Every day | ORAL | 5 refills | Status: DC

## 2017-08-13 NOTE — Progress Notes (Signed)
Patient ID: Maria Carpenter, female    DOB: June 13, 1981  MRN: [id number]  CC: re-establish care; Hypertension; Abdominal Pain; and Headache   Subjective: Maria Carpenter is a 36 y.o. female who presents for chronic disease management and to become established with me as PCP.  Patient last seen here in 2016 by Dr. Venetia Night. Her concerns today include:  Patient with history of HTN, DM, SVT, previous CVA, OSA, obesity, migraines, and ? Hx of SLE/RA  Patient was recently seen by gynecology for DUB and had EMB.  Told to follow-up with PCP to address blood pressure and concern about possible lump in the right breast mass.  1. HTN: checks BP Q a.m. Reports 180s/low 100s -on Lis/HCTZ consistently.  Out of Propranolol over 2 yrs. Was on it due to SVT. Still gets intermittent episodes SVT. She aborts by blowing hard through her mouth.  -limits salt   2. Hx of DM.  Controlled off insulin  -checks BS a.m and p.m. Range 80-92. Lowest 54 - highest 142. Reports episodes sometimes where she has to suck on tube of glucose to get BS up because appetite down -eating habits haphazard because of abdominal pain with meals. Occasional heart burns -Exercise: walking but gets inflammation in knees and ankles  3. Reportedly dx with JRA at age 78 and with  SLE in 2012.    Some sensitivity to "light" on hands, and sometimes the face. Sensitivity presents as Itching and burning. Has to take Benadryl.  - soreness in knees, RT shoulder and RT elbow with fever intermittently when she has a flare; worse in summer.   -never saw a rheumatologist. States she was dx with SLE by a doctor here in [place]  4. Pain in stomach with anything she eats. Points to epigastric to mid abdominal region -no worse or better with any particular type foods.  Pains sharp and last 3-4 mins and comes on within minutes of eating No N/V/D/Constipation She has had cholecystectomy  5.  Noted a red spot on the right breast several weeks ago that  has since resolved.  She did not feel any lump in the breast.  However on further questioning she states that she felt a lump in the left breast. Maternal GM and other grand aunts had breast cancer  Patient Active Problem List   Diagnosis Date Noted  . Abnormal uterine bleeding (AUB) 07/25/2017  . SVT (supraventricular tachycardia) (HCC) 06/23/2015  . Lupus 10/25/2013  . TIA (transient ischemic attack) 07/02/2012  . Diabetes mellitus (HCC) 07/02/2012  . Hypertension 07/02/2012  . Rheumatoid arthritis (HCC) 07/02/2012  . H/O: CVA (cerebrovascular accident) 07/02/2012  . Obesity 07/02/2012  . GERD (gastroesophageal reflux disease) 07/02/2012  . Obstructive sleep apnea 07/02/2012  . CTS (carpal tunnel syndrome) 07/02/2012  . Morbid obesity (HCC) 07/22/2011  . Choledocholithiasis with acute cholecystitis 07/22/2011     Current Outpatient Medications on File Prior to Visit  Medication Sig Dispense Refill  . propranolol (INDERAL) 10 MG tablet Take 1 tablet (10 mg total) by mouth 2 (two) times daily. 60 tablet 2  . albuterol (PROVENTIL HFA;VENTOLIN HFA) 108 (90 BASE) MCG/ACT inhaler Inhale 2 puffs into the lungs every 6 (six) hours as needed. For wheezing    . Cholecalciferol (VITAMIN D PO) Take 1 tablet by mouth daily.    . diphenhydrAMINE (BENADRYL) 2 % cream Apply 1 application topically 3 (three) times daily as needed for itching.    . diphenhydrAMINE (BENADRYL) 25 mg capsule Take 25 mg  by mouth every 6 (six) hours as needed for itching or allergies.    [name] ibuprofen (ADVIL,MOTRIN) 200 MG tablet Take 800 mg by mouth every 6 (six) hours as needed for moderate pain.    [name] lansoprazole (PREVACID) 15 MG capsule Take 15 mg by mouth daily at 12 noon.    [name] lisinopril-hydrochlorothiazide (PRINZIDE,ZESTORETIC) 10-12.5 MG tablet Take 1 tablet by mouth daily. 30 tablet 0  . MAGNESIUM-CALCIUM-FOLIC ACID PO Take by mouth.    . megestrol (MEGACE) 40 MG tablet Take 2 tablets (80 mg total) 2 (two) times  daily by mouth. (Patient not taking: Reported on 07/25/2017) 120 tablet 7  . Multiple Vitamins-Minerals (MULTIVITAMIN WITH MINERALS) tablet Take 1 tablet by mouth daily.      . Probiotic Product (PROBIOTIC PO) Take 1 capsule by mouth daily.     No current facility-administered medications on file prior to visit.     Allergies  Allergen Reactions  . Amoxicillin Anaphylaxis and Rash    Has patient had a PCN reaction causing immediate rash, facial/tongue/throat swelling, SOB or lightheadedness with hypotension: Yes Has patient had a PCN reaction causing severe rash involving mucus membranes or skin necrosis: Yes Has patient had a PCN reaction that required hospitalization Yes Has patient had a PCN reaction occurring within the last 10 years: No If all of the above answers are "NO", then may proceed with Cephalosporin use.   . Dilaudid [Hydromorphone Hcl] Itching    Social History   Socioeconomic History  . Marital status: Married    Spouse name: Not on file  . Number of children: Not on file  . Years of education: Not on file  . Highest education level: Not on file  Social Needs  . Financial resource strain: Not on file  . Food insecurity - worry: Not on file  . Food insecurity - inability: Not on file  . Transportation needs - medical: Not on file  . Transportation needs - non-medical: Not on file  Occupational History  . Not on file  Tobacco Use  . Smoking status: Current Every Day Smoker    Packs/day: 0.25    Years: 4.00    Pack years: 1.00    Types: Cigarettes  . Smokeless tobacco: Never Used  Substance and Sexual Activity  . Alcohol use: Yes    Comment: socially  . Drug use: Yes    Types: Marijuana    Comment: last month  . Sexual activity: Yes    Birth control/protection: Other-see comments, Surgical  Other Topics Concern  . Not on file  Social History Narrative  . Not on file    Family History  Problem Relation Age of Onset  . Anesthesia problems Mother          difficult to arouse, SOB  . Diabetes Mother   . Hypertension Mother   . Stroke Mother   . Diabetes Father   . Hypertension Father   . Heart disease Father   . Cancer Maternal Aunt   . Cancer Maternal Grandmother     Past Surgical History:  Procedure Laterality Date  . CHOLECYSTECTOMY  07/22/2011   Procedure: LAPAROSCOPIC CHOLECYSTECTOMY WITH INTRAOPERATIVE CHOLANGIOGRAM;  Surgeon: Kandis Cocking, MD;  Location: [facility];  Service: General;  Laterality: N/A;  . ERCP  07/24/2011   Procedure: ENDOSCOPIC RETROGRADE CHOLANGIOPANCREATOGRAPHY (ERCP);  Surgeon: Theda Belfast;  Location: [facility];  Service: Endoscopy;  Laterality: N/A;  . FINGER SURGERY  1993   left ring finger to correct bone "  problem"  . TUBAL LIGATION  2009    ROS: Review of Systems Negative except as stated above PHYSICAL EXAM: BP (!) 149/96   Pulse 96   Temp 99.5 F (37.5 C) (Oral)   Resp 16   Wt (!) 361 lb 12.8 oz (164.1 kg)   SpO2 99%   BMI 56.67 kg/m   Wt Readings from Last 3 Encounters:  08/13/17 (!) 361 lb 12.8 oz (164.1 kg)  07/25/17 (!) 358 lb 4.8 oz (162.5 kg)  07/03/17 (!) 358 lb (162.4 kg)    Physical Exam General appearance - alert, well appearing, morbidly obese young African-American female and in no distress Mental status - alert, oriented to person, place, and time, normal mood, behavior, speech, dress, motor activity, and thought processes Mouth - mucous membranes moist, pharynx normal without lesions Neck - supple, no significant adenopathy Chest - clear to auscultation, no wheezes, rales or rhonchi, symmetric air entry Heart - normal rate, regular rhythm, normal S1, S2, no murmurs, rubs, clicks or gallops Abdomen - obese, soft, nontender, nondistended, no masses or organomegaly. No hernias Breasts - breasts appear normal, no suspicious masses, no skin or nipple changes or axillary nodes Musculoskeletal -no signs of active inflammation in the wrist, hands, elbows.  Good range  of motion at both elbows, shoulders, knees and ankle joints Extremities - no LE edema Skin: no malar rash on face at this time  Lab Results  Component Value Date   HGBA1C 5.80 06/23/2015    Lab Results  Component Value Date   WBC 10.5 07/03/2017   HGB 11.6 07/03/2017   HCT 35.0 07/03/2017   MCV 82 07/03/2017   PLT 298 07/03/2017     Chemistry      Component Value Date/Time   NA 137 05/30/2017 1309   K 4.0 05/30/2017 1309   CL 103 05/30/2017 1309   CO2 27 05/30/2017 1309   BUN 10 05/30/2017 1309   CREATININE 0.66 05/30/2017 1309      Component Value Date/Time   CALCIUM 8.9 05/30/2017 1309   ALKPHOS 72 05/30/2017 1309   AST 16 05/30/2017 1309   ALT 13 (L) 05/30/2017 1309   BILITOT 0.4 05/30/2017 1309     Results for orders placed or performed in visit on 08/13/17  POCT glucose (manual entry)  Result Value Ref Range   POC Glucose 136 (A) 70 - 99 mg/dl  POCT glycosylated hemoglobin (Hb A1C)  Result Value Ref Range   Hemoglobin A1C 6.6     Dep[facility] screen [facility] 07/25/2017 07/03/2017 07/11/2015 07/11/2015 06/23/2015  Decreased Interest 2 3 0 0 2  Down, Depressed, Hopeless 3 3 0 - 2  PHQ - 2 Score 5 6 0 0 4  Altered sleeping 2 3 - - 3  Tired, decreased energy 3 3 - - 3  Change in appetite 3 2 - - 3  Feeling bad or failure about yourself  1 3 - - 3  Trouble concentrating 2 2 - - 2  Moving slowly or fidgety/restless 0 2 - - 0  Suicidal thoughts 1 1 - - 1  PHQ-9 Score 17 22 - - 19    ASSESSMENT AND PLAN: 1. Essential hypertension Not at goal.  Cont Lis/HCTZ. RF inderal - propranolol (INDERAL) 10 MG tablet; Take 1 tablet (10 mg total) by mouth 2 (two) times daily.  Dispense: 60 tablet; Refill: 5 - lisinopril-hydrochlorothiazide (PRINZIDE,ZESTORETIC) 10-12.5 MG tablet; Take 1 tablet by mouth daily.  Dispense: 30 tablet; Refill: 5  2.  SVT (supraventricular tachycardia) (HCC) - propranolol (INDERAL) 10 MG tablet; Take 1 tablet (10 mg total) by mouth 2 (two) times  daily.  Dispense: 60 tablet; Refill: 5  3. Controlled type 2 diabetes mellitus without complication, without long-term current use of insulin (HCC) -Encourage healthy eating habits and regular exercise several times a week. - POCT glucose (manual entry) - POCT glycosylated hemoglobin (Hb A1C) - Microalbumin / creatinine urine ratio  4. Morbid obesity (HCC) Encourage healthy eating and regular exercise to achieve weight loss.  5. Pain of upper abdomen Questionable etiology.  We will try her with omeprazole.  If no improvement will refer to GI - omeprazole (PRILOSEC) 20 MG capsule; Take 1 capsule (20 mg total) by mouth daily.  Dispense: 30 capsule; Refill: 3  6. Breast mass, left -Exam to me was unrevealing.  However she feels something in the left breast which I did not appreciate on exam myself.  Will refer for mammogram - [facility] Digital Diagnostic Unilat L; Future  7. Personal history of juvenile rheumatoid arthritis 8. History of lupus -dx is questionable.  Jt exam unrevealing. Will order some baseline studies and refer to rheumatologist once she gets approved for [facility] discount  Patient was given the opportunity to ask questions.  Patient verbalized understanding of the plan and was able to repeat key elements of the plan.   No orders of the defined types were placed in this encounter.    Requested Prescriptions    No prescriptions requested or ordered in this encounter    No Follow-up on file.  Jonah Blue, MD, FACP

## 2017-08-13 NOTE — Patient Instructions (Signed)
Please return to the lab fasting in 1-2 days to have blood tests done as discussed today.  Follow a Healthy Eating Plan - You can do it! Limit sugary drinks.  Avoid sodas, sweet tea, sport or energy drinks, or fruit drinks.  Drink water, lo-fat milk, or diet drinks. Limit snack foods.   Cut back on candy, cake, cookies, chips, ice cream.  These are a special treat, only in small amounts. Eat plenty of vegetables.  Especially dark green, red, and orange vegetables. Aim for at least 3 servings a day. More is better! Include fruit in your daily diet.  Whole fruit is much healthier than fruit juice! Limit "white" bread, "white" pasta, "white" rice.   Choose "100% whole grain" products, brown or wild rice. Avoid fatty meats. Try "Meatless Monday" and choose eggs or beans one day a week.  When eating meat, choose lean meats like chicken, [place], and fish.  Grill, broil, or bake meats instead of frying, and eat poultry without the skin. Eat less salt.  Avoid frozen pizzas, frozen dinners and salty foods.  Use seasonings other than salt in cooking.  This can help blood pressure and keep you from swelling Beer, wine and liquor have calories.  If you can safely drink alcohol, limit to 1 drink per day for women, 2 drinks for men

## 2017-08-14 LAB — MICROALBUMIN / CREATININE URINE RATIO
Creatinine, Urine: 346.7 mg/dL
Microalb/Creat Ratio: 238.4 mg/g creat — ABNORMAL HIGH (ref 0.0–30.0)
Microalbumin, Urine: 826.6 ug/mL

## 2017-08-24 ENCOUNTER — Other Ambulatory Visit: Payer: Self-pay | Admitting: Internal Medicine

## 2017-08-24 DIAGNOSIS — N6320 Unspecified lump in the left breast, unspecified quadrant: Secondary | ICD-10-CM

## 2017-10-03 ENCOUNTER — Ambulatory Visit: Payer: Self-pay | Admitting: Internal Medicine

## 2017-10-31 ENCOUNTER — Emergency Department (HOSPITAL_COMMUNITY)
Admission: EM | Admit: 2017-10-31 | Discharge: 2017-10-31 | Disposition: A | Discharge status: Home / Self Care | Payer: Self-pay | Attending: Emergency Medicine | Admitting: Emergency Medicine

## 2017-10-31 ENCOUNTER — Encounter (HOSPITAL_COMMUNITY): Payer: Self-pay | Admitting: Emergency Medicine

## 2017-10-31 DIAGNOSIS — K0889 Other specified disorders of teeth and supporting structures: Secondary | ICD-10-CM | POA: Insufficient documentation

## 2017-10-31 DIAGNOSIS — Z5321 Procedure and treatment not carried out due to patient leaving prior to being seen by health care provider: Secondary | ICD-10-CM | POA: Insufficient documentation

## 2017-10-31 NOTE — ED Notes (Signed)
Bed: WA02 Expected date:  Expected time:  Means of arrival:  Comments: 

## 2017-10-31 NOTE — ED Notes (Signed)
Pt called for room x3 no answer 

## 2017-10-31 NOTE — ED Notes (Signed)
No answer when called for room 

## 2017-10-31 NOTE — ED Triage Notes (Signed)
Pt c/o right side dental pain since Monday. Reports that she had swelling on right jaw.

## 2017-10-31 NOTE — ED Notes (Signed)
Pt called for room no answer x2.  

## 2017-10-31 NOTE — ED Notes (Signed)
Pt called for room no answer x1

## 2017-11-01 ENCOUNTER — Emergency Department (HOSPITAL_COMMUNITY): Payer: Self-pay

## 2017-11-01 ENCOUNTER — Emergency Department (HOSPITAL_COMMUNITY)
Admission: EM | Admit: 2017-11-01 | Discharge: 2017-11-01 | Disposition: A | Discharge status: Home / Self Care | Payer: Self-pay | Attending: Emergency Medicine | Admitting: Emergency Medicine

## 2017-11-01 ENCOUNTER — Other Ambulatory Visit: Payer: Self-pay

## 2017-11-01 ENCOUNTER — Encounter (HOSPITAL_COMMUNITY): Payer: Self-pay | Admitting: *Deleted

## 2017-11-01 DIAGNOSIS — Z79899 Other long term (current) drug therapy: Secondary | ICD-10-CM | POA: Insufficient documentation

## 2017-11-01 DIAGNOSIS — I10 Essential (primary) hypertension: Secondary | ICD-10-CM | POA: Insufficient documentation

## 2017-11-01 DIAGNOSIS — K047 Periapical abscess without sinus: Secondary | ICD-10-CM | POA: Insufficient documentation

## 2017-11-01 DIAGNOSIS — J45909 Unspecified asthma, uncomplicated: Secondary | ICD-10-CM | POA: Insufficient documentation

## 2017-11-01 DIAGNOSIS — F17210 Nicotine dependence, cigarettes, uncomplicated: Secondary | ICD-10-CM | POA: Insufficient documentation

## 2017-11-01 DIAGNOSIS — E119 Type 2 diabetes mellitus without complications: Secondary | ICD-10-CM | POA: Insufficient documentation

## 2017-11-01 LAB — BASIC METABOLIC PANEL
Anion gap: 12 (ref 5–15)
BUN: 10 mg/dL (ref 6–20)
CO2: 25 mmol/L (ref 22–32)
Calcium: 9.1 mg/dL (ref 8.9–10.3)
Chloride: 100 mmol/L — ABNORMAL LOW (ref 101–111)
Creatinine, Ser: 0.63 mg/dL (ref 0.44–1.00)
GFR calc Af Amer: >60 mL/min (ref >60)
GFR calc non Af Amer: >60 mL/min (ref >60)
Glucose, Bld: 96 mg/dL (ref 65–99)
Potassium: 3.7 mmol/L (ref 3.5–5.1)
Sodium: 137 mmol/L (ref 135–145)

## 2017-11-01 LAB — CBC WITH DIFFERENTIAL/PLATELET
Basophils Absolute: 0 10*3/uL (ref 0.0–0.1)
Basophils Relative: 0 %
Eosinophils Absolute: 0.1 10*3/uL (ref 0.0–0.7)
Eosinophils Relative: 1 %
HCT: 33.7 % — ABNORMAL LOW (ref 36.0–46.0)
Hemoglobin: 10.9 g/dL — ABNORMAL LOW (ref 12.0–15.0)
Lymphocytes Relative: 30 %
Lymphs Abs: 3.2 10*3/uL (ref 0.7–4.0)
MCH: 26.3 pg (ref 26.0–34.0)
MCHC: 32.3 g/dL (ref 30.0–36.0)
MCV: 81.4 fL (ref 78.0–100.0)
Monocytes Absolute: 0.9 10*3/uL (ref 0.1–1.0)
Monocytes Relative: 9 %
Neutro Abs: 6.4 10*3/uL (ref 1.7–7.7)
Neutrophils Relative %: 60 %
Platelets: 275 10*3/uL (ref 150–400)
RBC: 4.14 MIL/uL (ref 3.87–5.11)
RDW: 16.4 % — ABNORMAL HIGH (ref 11.5–15.5)
WBC: 10.7 10*3/uL — ABNORMAL HIGH (ref 4.0–10.5)

## 2017-11-01 MED ORDER — CLINDAMYCIN HCL 300 MG PO CAPS: 300.0000 mg | ORAL_CAPSULE | Freq: Four times a day (QID) | ORAL | 0 refills | Status: DC

## 2017-11-01 MED ORDER — CLINDAMYCIN PHOSPHATE 900 MG/50ML IV SOLN
900.0000 mg | Freq: Once | INTRAVENOUS | Status: AC
  Administered 2017-11-01: 900 mg via INTRAVENOUS
  Filled 2017-11-01: qty 50

## 2017-11-01 MED ORDER — SODIUM CHLORIDE 0.9 % IV BOLUS (SEPSIS)
1000.0000 mL | Freq: Once | INTRAVENOUS | Status: AC
  Administered 2017-11-01: 1000 mL via INTRAVENOUS

## 2017-11-01 MED ORDER — IOPAMIDOL (ISOVUE-300) INJECTION 61%
INTRAVENOUS | Status: AC
  Administered 2017-11-01: 75 mL
  Filled 2017-11-01: qty 75

## 2017-11-01 MED ORDER — MORPHINE SULFATE (PF) 4 MG/ML IV SOLN
4.0000 mg | Freq: Once | INTRAVENOUS | Status: AC
  Administered 2017-11-01: 4 mg via INTRAVENOUS
  Filled 2017-11-01: qty 1

## 2017-11-01 MED ORDER — OXYCODONE-ACETAMINOPHEN 5-325 MG PO TABS: 1.0000 | ORAL_TABLET | ORAL | 0 refills | Status: DC | PRN

## 2017-11-01 NOTE — Discharge Instructions (Signed)
Return here for any worsening in your condition.  Follow-up Monday if you have no changes that require recheck here in the emergency department with your dentist.

## 2017-11-01 NOTE — ED Provider Notes (Signed)
[facility] EMERGENCY DEPARTMENT Provider Note   CSN: [id number] Arrival date & time: 11/01/17  1100     History   Chief Complaint Chief Complaint  Patient presents with  . Dental Pain    HPI Maria Carpenter is a 37 y.o. female.  HPI Patient presents to the emergency department with right lower dental pain and went to see her dentist yesterday.  The patient states that he pulled the tooth that was bothering her.  Patient states that he did not start her on antibiotics.  Patient states she took Tylenol and Motrin with no significant relief of her pain.  Patient states that palpation makes the pain worse.  The patient denies chest pain, shortness of breath, headache,blurred vision, neck pain, fever, cough, weakness, numbness, dizziness, anorexia, edema, abdominal pain, nausea, vomiting, diarrhea, rash, back pain, dysuria, hematemesis, bloody stool, near syncope, or syncope. Past Medical History:  Diagnosis Date  . Anemia   . Asthma   . Chlamydia 2012   treated  . Complication of anesthesia    " they have trouble waking me up"  . Diabetes mellitus    A2DM during pregnancy. now on metformin.   . Family history of anesthesia complication    mother & daughter   . GERD (gastroesophageal reflux disease)   . Gonorrhea 2010   treated  . Hypertension   . Lupus 10-2013  . Morbid obesity (HCC) 07/22/2011  . Pregnancy induced hypertension    pre E with pregnancies  . Rheumatoid arthritis(714.0)   . Shortness of breath   . Sleep apnea    does not use cpap  . Stroke [facility]) 2012   March 2012>right side deficit (speech, upper/ower weakness)  . SVT (supraventricular tachycardia) [facility])     Patient Active Problem List   Diagnosis Date Noted  . Abnormal uterine bleeding (AUB) 07/25/2017  . SVT (supraventricular tachycardia) (HCC) 06/23/2015  . Lupus 10/25/2013  . TIA (transient ischemic attack) 07/02/2012  . Diabetes mellitus (HCC) 07/02/2012  . Hypertension  07/02/2012  . Rheumatoid arthritis (HCC) 07/02/2012  . H/O: CVA (cerebrovascular accident) 07/02/2012  . GERD (gastroesophageal reflux disease) 07/02/2012  . Obstructive sleep apnea 07/02/2012  . CTS (carpal tunnel syndrome) 07/02/2012  . Morbid obesity (HCC) 07/22/2011  . Choledocholithiasis with acute cholecystitis 07/22/2011    Past Surgical History:  Procedure Laterality Date  . CHOLECYSTECTOMY  07/22/2011   Procedure: LAPAROSCOPIC CHOLECYSTECTOMY WITH INTRAOPERATIVE CHOLANGIOGRAM;  Surgeon: Kandis Cocking, MD;  Location: [facility];  Service: General;  Laterality: N/A;  . ERCP  07/24/2011   Procedure: ENDOSCOPIC RETROGRADE CHOLANGIOPANCREATOGRAPHY (ERCP);  Surgeon: Theda Belfast;  Location: [facility];  Service: Endoscopy;  Laterality: N/A;  . FINGER SURGERY  1993   left ring finger to correct bone "problem"  . TUBAL LIGATION  2009    OB History    Gravida Para Term Preterm AB Living   9 6 0 6 3 6    SAB TAB Ectopic Multiple Live Births   3 0 0 0 5       Home Medications    Prior to Admission medications   Medication Sig Start Date End Date Taking? Authorizing Provider  albuterol (PROVENTIL HFA;VENTOLIN HFA) 108 (90 BASE) MCG/ACT inhaler Inhale 2 puffs into the lungs every 6 (six) hours as needed. For wheezing    [provider]  ibuprofen (ADVIL,MOTRIN) 200 MG tablet Take 800 mg by mouth every 6 (six) hours as needed for moderate pain.    [provider]  lisinopril-hydrochlorothiazide (PRINZIDE,ZESTORETIC) 10-12.5 MG tablet Take 1 tablet by mouth daily. 08/13/17   Marcine Matar, MD  megestrol (MEGACE) 40 MG tablet Take 2 tablets (80 mg total) 2 (two) times daily by mouth. Patient not taking: Reported on 07/25/2017 07/03/17   Tereso Newcomer, MD  Multiple Vitamins-Minerals (MULTIVITAMIN WITH MINERALS) tablet Take 1 tablet by mouth daily.      [provider]  omeprazole (PRILOSEC) 20 MG capsule Take 1 capsule (20 mg total) by mouth daily.  08/13/17   Marcine Matar, MD  Probiotic Product (PROBIOTIC PO) Take 1 capsule by mouth daily.    [provider]  propranolol (INDERAL) 10 MG tablet Take 1 tablet (10 mg total) by mouth 2 (two) times daily. 08/13/17   Marcine Matar, MD    Family History Family History  Problem Relation Age of Onset  . Anesthesia problems Mother        difficult to arouse, SOB  . Diabetes Mother   . Hypertension Mother   . Stroke Mother   . Diabetes Father   . Hypertension Father   . Heart disease Father   . Cancer Maternal Aunt   . Cancer Maternal Grandmother     Social History Social History   Tobacco Use  . Smoking status: Current Every Day Smoker    Packs/day: 0.25    Years: 4.00    Pack years: 1.00    Types: Cigarettes  . Smokeless tobacco: Never Used  Substance Use Topics  . Alcohol use: Yes    Comment: socially  . Drug use: Yes    Types: Marijuana    Comment: last month     Allergies   Amoxicillin and Dilaudid [hydromorphone hcl]   Review of Systems Review of Systems All other systems negative except as documented in the HPI. All pertinent positives and negatives as reviewed in the HPI.  Physical Exam Updated Vital Signs BP (!) 168/120   Pulse 78   Temp 99.3 F (37.4 C) (Oral)   Resp 17   Ht 5\' 7"  (1.702 m)   Wt (!) 156.5 kg (345 lb)   LMP 10/01/2017 (Approximate)   SpO2 100%   BMI 54.03 kg/m   Physical Exam  Constitutional: She is oriented to person, place, and time. She appears well-developed and well-nourished. No distress.  HENT:  Head: Normocephalic and atraumatic.    Mouth/Throat: Oropharynx is clear and moist.  Eyes: Pupils are equal, round, and reactive to light.  Neck: Normal range of motion. Neck supple.  Cardiovascular: Normal rate, regular rhythm and normal heart sounds. Exam reveals no gallop and no friction rub.  No murmur heard. Pulmonary/Chest: Effort normal and breath sounds normal. No respiratory distress. She has no  wheezes.  Neurological: She is alert and oriented to person, place, and time. She exhibits normal muscle tone. Coordination normal.  Skin: Skin is warm and dry. Capillary refill takes less than 2 seconds. No rash noted. No erythema.  Psychiatric: She has a normal mood and affect. Her behavior is normal.  Nursing note and vitals reviewed.    ED Treatments / Results  Labs (all labs ordered are listed, but only abnormal results are displayed) Labs Reviewed  BASIC METABOLIC PANEL - Abnormal; Notable for the following components:      Result Value   Chloride 100 (*)    All other components within normal limits  CBC WITH DIFFERENTIAL/PLATELET - Abnormal; Notable for the following components:   WBC 10.7 (*)  Hemoglobin 10.9 (*)    HCT 33.7 (*)    RDW 16.4 (*)    All other components within normal limits    EKG  EKG Interpretation None       Radiology Ct Soft Tissue Neck W Contrast  Result Date: 11/01/2017 CLINICAL DATA:  Dental pain on the right.  Recent tooth extraction. EXAM: CT NECK WITH CONTRAST TECHNIQUE: Multidetector CT imaging of the neck was performed using the standard protocol following the bolus administration of intravenous contrast. CONTRAST:  3mL ISOVUE-300 IOPAMIDOL (ISOVUE-300) INJECTION 61% COMPARISON:  None. FINDINGS: Pharynx and larynx: Negative. Salivary glands: No inflammation, mass, or stone. Thyroid: Normal. Lymph nodes: Mild rounding and enlargement of right submandibular lymph nodes that is considered reactive. Vascular: Negative. Limited intracranial: Negative. Visualized orbits: Negative. Mastoids and visualized paranasal sinuses: Clear. Skeleton: No acute or aggressive process. Upper chest: Negative. Other: Subcutaneous fat stranding and expansion about the right jaw. Recently extracted tooth 30 without residual tooth root or neighboring abscess. Inflammation continues below the chin. No swelling seen deep to the mylohyoid or about the central compartment.  Contralateral first molar is carious with periapical erosion. Bilateral terminal maxillary molars are carious. IMPRESSION: 1. Cellulitis in the right face, reportedly odontogenic with recent extraction. No abscess or floor of mouth swelling. 2. Additional carious molars. Electronically Signed   By: Marnee Spring M.D.   On: 11/01/2017 15:44    Procedures Procedures (including critical care time)  Medications Ordered in ED Medications  clindamycin (CLEOCIN) IVPB 900 mg (900 mg Intravenous New Bag/Given 11/01/17 1534)  morphine 4 MG/ML injection 4 mg (not administered)  sodium chloride 0.9 % bolus 1,000 mL (1,000 mLs Intravenous New Bag/Given 11/01/17 1534)  iopamidol (ISOVUE-300) 61 % injection (75 mLs  Contrast Given 11/01/17 1504)     Initial Impression / Assessment and Plan / ED Course  I have reviewed the triage vital signs and the nursing notes.  Pertinent labs & imaging results that were available during my care of the patient were reviewed by me and considered in my medical decision making (see chart for details).  Patient has a CT scan that does not show any signs of abscess.  Patient does have cellulitis noted on the CT scan.  Patient was given IV antibiotics here in the emergency department.  Final Clinical Impressions(s) / ED Diagnoses   Final diagnoses:  None    ED Discharge Orders    None       Charlestine Night, PA-C 11/01/17 1554    Cathren Laine, MD 11/04/17 1106

## 2017-11-01 NOTE — ED Triage Notes (Signed)
Pt in reports going to [facility] yesterday for R lower dental pain and left and went to a dentist who pulled a L lower tooth, since that time pt reports running a fever and increased swelling, pt has obvious swelling to L face, pt c/o Paion radiating down the R side of her body, A&O x4

## 2017-12-15 ENCOUNTER — Inpatient Hospital Stay (HOSPITAL_COMMUNITY)
Admission: AD | Admit: 2017-12-15 | Discharge: 2017-12-15 | Disposition: A | Discharge status: Home / Self Care | Payer: Self-pay | Source: Ambulatory Visit | Attending: Family Medicine | Admitting: Family Medicine

## 2017-12-15 ENCOUNTER — Inpatient Hospital Stay (HOSPITAL_COMMUNITY): Payer: Self-pay

## 2017-12-15 ENCOUNTER — Encounter (HOSPITAL_COMMUNITY): Payer: Self-pay

## 2017-12-15 DIAGNOSIS — Z79891 Long term (current) use of opiate analgesic: Secondary | ICD-10-CM | POA: Insufficient documentation

## 2017-12-15 DIAGNOSIS — Z8249 Family history of ischemic heart disease and other diseases of the circulatory system: Secondary | ICD-10-CM | POA: Insufficient documentation

## 2017-12-15 DIAGNOSIS — E119 Type 2 diabetes mellitus without complications: Secondary | ICD-10-CM | POA: Insufficient documentation

## 2017-12-15 DIAGNOSIS — Z885 Allergy status to narcotic agent status: Secondary | ICD-10-CM | POA: Insufficient documentation

## 2017-12-15 DIAGNOSIS — Z791 Long term (current) use of non-steroidal anti-inflammatories (NSAID): Secondary | ICD-10-CM | POA: Insufficient documentation

## 2017-12-15 DIAGNOSIS — Z8673 Personal history of transient ischemic attack (TIA), and cerebral infarction without residual deficits: Secondary | ICD-10-CM | POA: Insufficient documentation

## 2017-12-15 DIAGNOSIS — F17210 Nicotine dependence, cigarettes, uncomplicated: Secondary | ICD-10-CM | POA: Insufficient documentation

## 2017-12-15 DIAGNOSIS — K219 Gastro-esophageal reflux disease without esophagitis: Secondary | ICD-10-CM | POA: Insufficient documentation

## 2017-12-15 DIAGNOSIS — Z809 Family history of malignant neoplasm, unspecified: Secondary | ICD-10-CM | POA: Insufficient documentation

## 2017-12-15 DIAGNOSIS — Z880 Allergy status to penicillin: Secondary | ICD-10-CM | POA: Insufficient documentation

## 2017-12-15 DIAGNOSIS — N939 Abnormal uterine and vaginal bleeding, unspecified: Secondary | ICD-10-CM | POA: Insufficient documentation

## 2017-12-15 DIAGNOSIS — I10 Essential (primary) hypertension: Secondary | ICD-10-CM | POA: Insufficient documentation

## 2017-12-15 DIAGNOSIS — Z9889 Other specified postprocedural states: Secondary | ICD-10-CM | POA: Insufficient documentation

## 2017-12-15 DIAGNOSIS — Z833 Family history of diabetes mellitus: Secondary | ICD-10-CM | POA: Insufficient documentation

## 2017-12-15 DIAGNOSIS — Z823 Family history of stroke: Secondary | ICD-10-CM | POA: Insufficient documentation

## 2017-12-15 DIAGNOSIS — R102 Pelvic and perineal pain: Secondary | ICD-10-CM | POA: Insufficient documentation

## 2017-12-15 DIAGNOSIS — Z9049 Acquired absence of other specified parts of digestive tract: Secondary | ICD-10-CM | POA: Insufficient documentation

## 2017-12-15 DIAGNOSIS — J45909 Unspecified asthma, uncomplicated: Secondary | ICD-10-CM | POA: Insufficient documentation

## 2017-12-15 DIAGNOSIS — Z79899 Other long term (current) drug therapy: Secondary | ICD-10-CM | POA: Insufficient documentation

## 2017-12-15 LAB — CBC
HCT: 32.6 % — ABNORMAL LOW (ref 36.0–46.0)
Hemoglobin: 10.6 g/dL — ABNORMAL LOW (ref 12.0–15.0)
MCH: 26.4 pg (ref 26.0–34.0)
MCHC: 32.5 g/dL (ref 30.0–36.0)
MCV: 81.1 fL (ref 78.0–100.0)
Platelets: 260 10*3/uL (ref 150–400)
RBC: 4.02 MIL/uL (ref 3.87–5.11)
RDW: 16.9 % — ABNORMAL HIGH (ref 11.5–15.5)
WBC: 9.7 10*3/uL (ref 4.0–10.5)

## 2017-12-15 LAB — URINALYSIS, ROUTINE W REFLEX MICROSCOPIC
Bilirubin Urine: NEGATIVE
Glucose, UA: NEGATIVE mg/dL
Ketones, ur: NEGATIVE mg/dL
Leukocytes, UA: NEGATIVE
Nitrite: NEGATIVE
Protein, ur: 30 mg/dL — AB
Specific Gravity, Urine: 1.029 (ref 1.005–1.030)
WBC, UA: NONE SEEN WBC/hpf (ref 0–5)
pH: 5 (ref 5.0–8.0)

## 2017-12-15 LAB — HCG, QUANTITATIVE, PREGNANCY: hCG, Beta Chain, Quant, S: <1 m[IU]/mL (ref <5)

## 2017-12-15 LAB — WET PREP, GENITAL
Clue Cells Wet Prep HPF POC: NONE SEEN
Sperm: NONE SEEN
Trich, Wet Prep: NONE SEEN
Yeast Wet Prep HPF POC: NONE SEEN

## 2017-12-15 LAB — POCT PREGNANCY, URINE: Preg Test, Ur: NEGATIVE

## 2017-12-15 MED ORDER — OXYCODONE-ACETAMINOPHEN 5-325 MG PO TABS
2.0000 | ORAL_TABLET | Freq: Once | ORAL | Status: AC
  Administered 2017-12-15: 2 via ORAL
  Filled 2017-12-15: qty 2

## 2017-12-15 NOTE — Discharge Instructions (Signed)

## 2017-12-15 NOTE — MAU Provider Note (Signed)
History     CSN: [id number]  Arrival date and time: 12/15/17 1645   First Provider Initiated Contact with Patient 12/15/17 1716      Chief Complaint  Patient presents with  . Vaginal Bleeding   Maria Carpenter is a 37 y.o [id number] at Unknown who presents today with vaginal bleeding and cramping. She went through a lot of stress on Friday, and then she started to have cramping, then spotting and now bleeding. She had a +upt at home at the end of March, and several more since the beginning of this month.   Vaginal Bleeding  The patient's primary symptoms include pelvic pain and vaginal bleeding. This is a new problem. The current episode started in the past 7 days. The problem occurs constantly. The problem has been gradually worsening. Pain severity now: 8/10  The problem affects the right side. Associated symptoms include nausea and vomiting. Pertinent negatives include no chills, dysuria, fever, frequency or urgency. The vaginal discharge was bloody. The vaginal bleeding is heavier than menses. She has been passing clots (about the size of plum ). She has not been passing tissue. She is sexually active. She uses tubal ligation (BTL in 2009 ) for contraception. Her menstrual history has been irregular (LMP 09/20/17 ).   Past Medical History:  Diagnosis Date  . Anemia   . Asthma   . Chlamydia 2012   treated  . Complication of anesthesia    " they have trouble waking me up"  . Diabetes mellitus    A2DM during pregnancy. now on metformin.   . Family history of anesthesia complication    mother & daughter   . GERD (gastroesophageal reflux disease)   . Gonorrhea 2010   treated  . Hypertension   . Lupus (HCC) 10-2013  . Morbid obesity (HCC) 07/22/2011  . Pregnancy induced hypertension    pre E with pregnancies  . Rheumatoid arthritis(714.0)   . Shortness of breath   . Sleep apnea    does not use cpap  . Stroke [facility]) 2012   March 2012>right side deficit (speech, upper/ower  weakness)  . SVT (supraventricular tachycardia) (HCC)     Past Surgical History:  Procedure Laterality Date  . CHOLECYSTECTOMY  07/22/2011   Procedure: LAPAROSCOPIC CHOLECYSTECTOMY WITH INTRAOPERATIVE CHOLANGIOGRAM;  Surgeon: Kandis Cocking, MD;  Location: [facility] ORS;  Service: General;  Laterality: N/A;  . ERCP  07/24/2011   Procedure: ENDOSCOPIC RETROGRADE CHOLANGIOPANCREATOGRAPHY (ERCP);  Surgeon: Theda Belfast;  Location: [facility] ENDOSCOPY;  Service: Endoscopy;  Laterality: N/A;  . FINGER SURGERY  1993   left ring finger to correct bone "problem"  . TUBAL LIGATION  2009    Family History  Problem Relation Age of Onset  . Anesthesia problems Mother        difficult to arouse, SOB  . Diabetes Mother   . Hypertension Mother   . Stroke Mother   . Diabetes Father   . Hypertension Father   . Heart disease Father   . Cancer Maternal Aunt   . Cancer Maternal Grandmother     Social History   Tobacco Use  . Smoking status: Current Every Day Smoker    Packs/day: 0.25    Years: 4.00    Pack years: 1.00    Types: Cigarettes  . Smokeless tobacco: Never Used  . Tobacco comment: 5 cig/day  Substance Use Topics  . Alcohol use: Yes    Comment: socially  . Drug use: Yes    Types: Marijuana  Comment: last month    Allergies:  Allergies  Allergen Reactions  . Amoxicillin Anaphylaxis and Rash    Has patient had a PCN reaction causing immediate rash, facial/tongue/throat swelling, SOB or lightheadedness with hypotension: Yes Has patient had a PCN reaction causing severe rash involving mucus membranes or skin necrosis: Yes Has patient had a PCN reaction that required hospitalization Yes Has patient had a PCN reaction occurring within the last 10 years: No If all of the above answers are "NO", then may proceed with Cephalosporin use.   . Dilaudid [Hydromorphone Hcl] Itching    Medications Prior to Admission  Medication Sig Dispense Refill Last Dose  . albuterol (PROVENTIL  HFA;VENTOLIN HFA) 108 (90 BASE) MCG/ACT inhaler Inhale 2 puffs into the lungs every 6 (six) hours as needed. For wheezing   Not Taking  . clindamycin (CLEOCIN) 300 MG capsule Take 1 capsule (300 mg total) by mouth 4 (four) times daily. 40 capsule 0   . ibuprofen (ADVIL,MOTRIN) 200 MG tablet Take 800 mg by mouth every 6 (six) hours as needed for moderate pain.   Not Taking  . lisinopril-hydrochlorothiazide (PRINZIDE,ZESTORETIC) 10-12.5 MG tablet Take 1 tablet by mouth daily. 30 tablet 5   . megestrol (MEGACE) 40 MG tablet Take 2 tablets (80 mg total) 2 (two) times daily by mouth. (Patient not taking: Reported on 07/25/2017) 120 tablet 7 Not Taking  . Multiple Vitamins-Minerals (MULTIVITAMIN WITH MINERALS) tablet Take 1 tablet by mouth daily.     Not Taking  . omeprazole (PRILOSEC) 20 MG capsule Take 1 capsule (20 mg total) by mouth daily. 30 capsule 3   . oxyCODONE-acetaminophen (PERCOCET/ROXICET) 5-325 MG tablet Take 1 tablet by mouth every 4 (four) hours as needed for severe pain. 25 tablet 0   . Probiotic Product (PROBIOTIC PO) Take 1 capsule by mouth daily.   Not Taking  . propranolol (INDERAL) 10 MG tablet Take 1 tablet (10 mg total) by mouth 2 (two) times daily. 60 tablet 5     Review of Systems  Constitutional: Negative for chills and fever.  Gastrointestinal: Positive for nausea and vomiting.  Genitourinary: Positive for pelvic pain and vaginal bleeding. Negative for dysuria, frequency and urgency.   Physical Exam   Blood pressure (!) 177/102, pulse (!) 103, temperature 98.4 F (36.9 C), temperature source Oral, resp. rate 20, last menstrual period 09/20/2017.  Physical Exam  Nursing note and vitals reviewed. Constitutional: She is oriented to person, place, and time. She appears well-developed and well-nourished. No distress.  HENT:  Head: Normocephalic.  Cardiovascular: Normal rate.  Respiratory: Effort normal.  GI: Soft. There is no tenderness. There is no rebound.   Genitourinary:  Genitourinary Comments: External: no lesion Vagina: small amount of blood noted Cervix: pink, smooth, no CMT Uterus: NSSC Adnexa: NT  Neurological: She is alert and oriented to person, place, and time.  Skin: Skin is warm and dry.  Psychiatric: She has a normal mood and affect.   Results for orders placed or performed during the hospital encounter of 12/15/17 (from the past 24 hour(s))  Urinalysis, Routine w reflex microscopic     Status: Abnormal   Collection Time: 12/15/17  4:55 PM  Result Value Ref Range   Color, Urine YELLOW YELLOW   APPearance HAZY (A) CLEAR   Specific Gravity, Urine 1.029 1.005 - 1.030   pH 5.0 5.0 - 8.0   Glucose, UA NEGATIVE NEGATIVE mg/dL   Hgb urine dipstick LARGE (A) NEGATIVE   Bilirubin Urine NEGATIVE NEGATIVE  Ketones, ur NEGATIVE NEGATIVE mg/dL   Protein, ur 30 (A) NEGATIVE mg/dL   Nitrite NEGATIVE NEGATIVE   Leukocytes, UA NEGATIVE NEGATIVE   RBC / HPF TOO NUMEROUS TO COUNT 0 - 5 RBC/hpf   WBC, UA NONE SEEN 0 - 5 WBC/hpf   Bacteria, UA RARE (A) NONE SEEN   Squamous Epithelial / LPF 0-5 (A) NONE SEEN   Mucus PRESENT   Pregnancy, urine POC     Status: None   Collection Time: 12/15/17  5:18 PM  Result Value Ref Range   Preg Test, Ur NEGATIVE NEGATIVE  Wet prep, genital     Status: Abnormal   Collection Time: 12/15/17  5:23 PM  Result Value Ref Range   Yeast Wet Prep HPF POC NONE SEEN NONE SEEN   Trich, Wet Prep NONE SEEN NONE SEEN   Clue Cells Wet Prep HPF POC NONE SEEN NONE SEEN   WBC, Wet Prep HPF POC FEW (A) NONE SEEN   Sperm NONE SEEN   CBC     Status: Abnormal   Collection Time: 12/15/17  5:40 PM  Result Value Ref Range   WBC 9.7 4.0 - 10.5 K/uL   RBC 4.02 3.87 - 5.11 MIL/uL   Hemoglobin 10.6 (L) 12.0 - 15.0 g/dL   HCT 80.9 (L) 98.3 - 38.2 %   MCV 81.1 78.0 - 100.0 fL   MCH 26.4 26.0 - 34.0 pg   MCHC 32.5 30.0 - 36.0 g/dL   RDW 50.5 (H) 39.7 - 67.3 %   Platelets 260 150 - 400 K/uL  hCG, quantitative, pregnancy      Status: None   Collection Time: 12/15/17  5:40 PM  Result Value Ref Range   hCG, Beta Chain, Quant, S <1 <5 mIU/mL   US Pelvic Complete W Transvaginal And Torsion R/o  Result Date: 12/15/2017 CLINICAL DATA:  Heavy bleeding, pelvic pain EXAM: TRANSABDOMINAL AND TRANSVAGINAL ULTRASOUND OF PELVIS DOPPLER ULTRASOUND OF OVARIES TECHNIQUE: Both transabdominal and transvaginal ultrasound examinations of the pelvis were performed. Transabdominal technique was performed for global imaging of the pelvis including uterus, ovaries, adnexal regions, and pelvic cul-de-sac. It was necessary to proceed with endovaginal exam following the transabdominal exam to visualize the uterus and endometrium. Color and duplex Doppler ultrasound was utilized to evaluate blood flow to the ovaries. COMPARISON:  None. FINDINGS: Uterus Measurements: 11.8 x 5.6 x 6.6 cm. No fibroids or other mass visualized. Endometrium Thickness: 18 mm in thickness.  No focal abnormality visualized. Right ovary Measurements: 2.6 x 1.1 x 1.7 cm. Normal appearance/no adnexal mass. Left ovary Measurements: 2.1 x 1.1 x 1.7 cm. Normal appearance/no adnexal mass. Pulsed Doppler evaluation of both ovaries demonstrates normal low-resistance arterial and venous waveforms. Other findings No abnormal free fluid. IMPRESSION: Unremarkable pelvic ultrasound. Electronically Signed   By: Charlett Nose M.D.   On: 12/15/2017 20:05   MAU Course  Procedures  MDM   Assessment and Plan   1. Abnormal uterine bleeding   2. Pelvic pain    DC home Comfort measures reviewed  Bleeding precautions RX: none  Return to MAU as needed FU with OB as planned  Follow-up Information    [facility].   Specialty:  Obstetrics and Gynecology Contact information: [address] [place] [address] [phone]           Thressa Sheller 12/15/2017, 5:19 PM

## 2017-12-15 NOTE — MAU Note (Signed)
Started spotting yesterday, woke up this AM and reports bleeding like a light period and it has gotten heavier. Saturated pad In 30 min, feeling faint. Having some cramping. Hx of tubal ligation. 5 pos HPT at home.

## 2017-12-16 LAB — GC/CHLAMYDIA PROBE AMP (~~LOC~~) NOT AT ARMC
Chlamydia: NEGATIVE
Neisseria Gonorrhea: NEGATIVE

## 2018-08-30 ENCOUNTER — Observation Stay (HOSPITAL_COMMUNITY)
Admission: EM | Admit: 2018-08-30 | Discharge: 2018-08-31 | Disposition: A | Discharge status: Home / Self Care | Payer: Self-pay | Attending: Internal Medicine | Admitting: Internal Medicine

## 2018-08-30 ENCOUNTER — Encounter (HOSPITAL_COMMUNITY): Payer: Self-pay | Admitting: Emergency Medicine

## 2018-08-30 ENCOUNTER — Other Ambulatory Visit: Payer: Self-pay

## 2018-08-30 ENCOUNTER — Emergency Department (HOSPITAL_COMMUNITY): Payer: Self-pay

## 2018-08-30 DIAGNOSIS — J4521 Mild intermittent asthma with (acute) exacerbation: Secondary | ICD-10-CM | POA: Insufficient documentation

## 2018-08-30 DIAGNOSIS — R6889 Other general symptoms and signs: Secondary | ICD-10-CM

## 2018-08-30 DIAGNOSIS — Z6841 Body Mass Index (BMI) 40.0 and over, adult: Secondary | ICD-10-CM | POA: Insufficient documentation

## 2018-08-30 DIAGNOSIS — G4733 Obstructive sleep apnea (adult) (pediatric): Secondary | ICD-10-CM | POA: Insufficient documentation

## 2018-08-30 DIAGNOSIS — I10 Essential (primary) hypertension: Secondary | ICD-10-CM | POA: Insufficient documentation

## 2018-08-30 DIAGNOSIS — I1A0 Resistant hypertension: Secondary | ICD-10-CM | POA: Diagnosis present

## 2018-08-30 DIAGNOSIS — E119 Type 2 diabetes mellitus without complications: Secondary | ICD-10-CM

## 2018-08-30 DIAGNOSIS — E1169 Type 2 diabetes mellitus with other specified complication: Secondary | ICD-10-CM

## 2018-08-30 DIAGNOSIS — I471 Supraventricular tachycardia: Secondary | ICD-10-CM | POA: Insufficient documentation

## 2018-08-30 DIAGNOSIS — K219 Gastro-esophageal reflux disease without esophagitis: Secondary | ICD-10-CM | POA: Insufficient documentation

## 2018-08-30 DIAGNOSIS — E1165 Type 2 diabetes mellitus with hyperglycemia: Secondary | ICD-10-CM | POA: Insufficient documentation

## 2018-08-30 DIAGNOSIS — M329 Systemic lupus erythematosus, unspecified: Secondary | ICD-10-CM | POA: Insufficient documentation

## 2018-08-30 DIAGNOSIS — J101 Influenza due to other identified influenza virus with other respiratory manifestations: Principal | ICD-10-CM | POA: Insufficient documentation

## 2018-08-30 DIAGNOSIS — E860 Dehydration: Secondary | ICD-10-CM | POA: Insufficient documentation

## 2018-08-30 DIAGNOSIS — I69351 Hemiplegia and hemiparesis following cerebral infarction affecting right dominant side: Secondary | ICD-10-CM | POA: Insufficient documentation

## 2018-08-30 DIAGNOSIS — I69328 Other speech and language deficits following cerebral infarction: Secondary | ICD-10-CM | POA: Insufficient documentation

## 2018-08-30 DIAGNOSIS — M069 Rheumatoid arthritis, unspecified: Secondary | ICD-10-CM | POA: Insufficient documentation

## 2018-08-30 DIAGNOSIS — Z880 Allergy status to penicillin: Secondary | ICD-10-CM | POA: Insufficient documentation

## 2018-08-30 DIAGNOSIS — Z885 Allergy status to narcotic agent status: Secondary | ICD-10-CM | POA: Insufficient documentation

## 2018-08-30 DIAGNOSIS — Z791 Long term (current) use of non-steroidal anti-inflammatories (NSAID): Secondary | ICD-10-CM | POA: Insufficient documentation

## 2018-08-30 LAB — INFLUENZA PANEL BY PCR (TYPE A & B)
Influenza A By PCR: NEGATIVE
Influenza B By PCR: POSITIVE — AB

## 2018-08-30 LAB — URINALYSIS, ROUTINE W REFLEX MICROSCOPIC
Bilirubin Urine: NEGATIVE
Glucose, UA: NEGATIVE mg/dL
Hgb urine dipstick: NEGATIVE
Leukocytes, UA: NEGATIVE
Nitrite: NEGATIVE
Specific Gravity, Urine: 1.025 (ref 1.005–1.030)
pH: 6 (ref 5.0–8.0)

## 2018-08-30 LAB — COMPREHENSIVE METABOLIC PANEL
ALT: 17 U/L (ref 0–44)
AST: 21 U/L (ref 15–41)
Albumin: 4.2 g/dL (ref 3.5–5.0)
Alkaline Phosphatase: 94 U/L (ref 38–126)
Anion gap: 12 (ref 5–15)
BUN: 16 mg/dL (ref 6–20)
CO2: 25 mmol/L (ref 22–32)
Calcium: 9.3 mg/dL (ref 8.9–10.3)
Chloride: 96 mmol/L — ABNORMAL LOW (ref 98–111)
Creatinine, Ser: 0.83 mg/dL (ref 0.44–1.00)
GFR calc Af Amer: >60 mL/min (ref >60)
GFR calc non Af Amer: >60 mL/min (ref >60)
Glucose, Bld: 236 mg/dL — ABNORMAL HIGH (ref 70–99)
Potassium: 3.8 mmol/L (ref 3.5–5.1)
Sodium: 133 mmol/L — ABNORMAL LOW (ref 135–145)
Total Bilirubin: <0.1 mg/dL — ABNORMAL LOW (ref 0.3–1.2)
Total Protein: 8.5 g/dL — ABNORMAL HIGH (ref 6.5–8.1)

## 2018-08-30 LAB — URINALYSIS, MICROSCOPIC (REFLEX): Bacteria, UA: NONE SEEN

## 2018-08-30 LAB — CBC WITH DIFFERENTIAL/PLATELET
Abs Immature Granulocytes: 0.03 10*3/uL (ref 0.00–0.07)
Basophils Absolute: 0 10*3/uL (ref 0.0–0.1)
Basophils Relative: 0 %
Eosinophils Absolute: 0 10*3/uL (ref 0.0–0.5)
Eosinophils Relative: 0 %
HCT: 40.9 % (ref 36.0–46.0)
Hemoglobin: 13.2 g/dL (ref 12.0–15.0)
Immature Granulocytes: 1 %
Lymphocytes Relative: 43 %
Lymphs Abs: 2.3 10*3/uL (ref 0.7–4.0)
MCH: 27 pg (ref 26.0–34.0)
MCHC: 32.3 g/dL (ref 30.0–36.0)
MCV: 83.6 fL (ref 80.0–100.0)
Monocytes Absolute: 0.7 10*3/uL (ref 0.1–1.0)
Monocytes Relative: 13 %
Neutro Abs: 2.3 10*3/uL (ref 1.7–7.7)
Neutrophils Relative %: 43 %
Platelets: 240 10*3/uL (ref 150–400)
RBC: 4.89 MIL/uL (ref 3.87–5.11)
RDW: 15.1 % (ref 11.5–15.5)
WBC: 5.4 10*3/uL (ref 4.0–10.5)
nRBC: 0 % (ref 0.0–0.2)

## 2018-08-30 LAB — CBG MONITORING, ED
Glucose-Capillary: 384 mg/dL — ABNORMAL HIGH (ref 70–99)
Glucose-Capillary: 334 mg/dL — ABNORMAL HIGH (ref 70–99)

## 2018-08-30 LAB — GLUCOSE, CAPILLARY
Glucose-Capillary: 304 mg/dL — ABNORMAL HIGH (ref 70–99)
Glucose-Capillary: 194 mg/dL — ABNORMAL HIGH (ref 70–99)

## 2018-08-30 LAB — PREGNANCY, URINE: Preg Test, Ur: NEGATIVE

## 2018-08-30 LAB — BRAIN NATRIURETIC PEPTIDE: B Natriuretic Peptide: 14.3 pg/mL (ref 0.0–100.0)

## 2018-08-30 LAB — HEMOGLOBIN A1C
Hgb A1c MFr Bld: 7.8 % — ABNORMAL HIGH (ref 4.8–5.6)
Mean Plasma Glucose: 177.16 mg/dL

## 2018-08-30 MED ORDER — PANTOPRAZOLE SODIUM 20 MG PO TBEC
20.0000 mg | DELAYED_RELEASE_TABLET | Freq: Every day | ORAL | Status: DC
  Administered 2018-08-30 – 2018-08-31 (×2): 20 mg via ORAL
  Filled 2018-08-30 (×2): qty 1

## 2018-08-30 MED ORDER — DM-GUAIFENESIN ER 30-600 MG PO TB12
2.0000 | ORAL_TABLET | Freq: Two times a day (BID) | ORAL | Status: DC | PRN
  Administered 2018-08-30: 2 via ORAL
  Filled 2018-08-30: qty 2

## 2018-08-30 MED ORDER — LORATADINE 10 MG PO TABS
10.0000 mg | ORAL_TABLET | Freq: Every day | ORAL | Status: DC
  Administered 2018-08-30 – 2018-08-31 (×2): 10 mg via ORAL
  Filled 2018-08-30 (×2): qty 1

## 2018-08-30 MED ORDER — ALBUTEROL SULFATE (2.5 MG/3ML) 0.083% IN NEBU
5.0000 mg | INHALATION_SOLUTION | Freq: Once | RESPIRATORY_TRACT | Status: AC
  Administered 2018-08-30: 5 mg via RESPIRATORY_TRACT
  Filled 2018-08-30: qty 6

## 2018-08-30 MED ORDER — ACETAMINOPHEN 650 MG RE SUPP: 650.0000 mg | Freq: Four times a day (QID) | RECTAL | Status: DC | PRN

## 2018-08-30 MED ORDER — GUAIFENESIN-DM 100-10 MG/5ML PO SYRP
5.0000 mL | ORAL_SOLUTION | ORAL | Status: DC | PRN
  Filled 2018-08-30: qty 10

## 2018-08-30 MED ORDER — HYDROCHLOROTHIAZIDE 12.5 MG PO CAPS
12.5000 mg | ORAL_CAPSULE | Freq: Every day | ORAL | Status: DC
  Administered 2018-08-30 – 2018-08-31 (×2): 12.5 mg via ORAL
  Filled 2018-08-30 (×2): qty 1

## 2018-08-30 MED ORDER — INSULIN GLARGINE 100 UNIT/ML ~~LOC~~ SOLN
10.0000 [IU] | Freq: Every day | SUBCUTANEOUS | Status: DC
  Administered 2018-08-30 – 2018-08-31 (×2): 10 [IU] via SUBCUTANEOUS
  Filled 2018-08-30 (×2): qty 0.1

## 2018-08-30 MED ORDER — ACETAMINOPHEN 325 MG PO TABS
650.0000 mg | ORAL_TABLET | Freq: Once | ORAL | Status: AC
  Administered 2018-08-30: 650 mg via ORAL
  Filled 2018-08-30: qty 2

## 2018-08-30 MED ORDER — ALBUTEROL SULFATE (2.5 MG/3ML) 0.083% IN NEBU
2.5000 mg | INHALATION_SOLUTION | RESPIRATORY_TRACT | Status: DC | PRN
  Administered 2018-08-30: 2.5 mg via RESPIRATORY_TRACT
  Filled 2018-08-30 (×2): qty 3

## 2018-08-30 MED ORDER — PROPRANOLOL HCL 10 MG PO TABS
10.0000 mg | ORAL_TABLET | Freq: Two times a day (BID) | ORAL | Status: DC
  Administered 2018-08-30 – 2018-08-31 (×3): 10 mg via ORAL
  Filled 2018-08-30 (×3): qty 1

## 2018-08-30 MED ORDER — OSELTAMIVIR PHOSPHATE 75 MG PO CAPS
75.0000 mg | ORAL_CAPSULE | Freq: Two times a day (BID) | ORAL | Status: DC
  Administered 2018-08-30 – 2018-08-31 (×4): 75 mg via ORAL
  Filled 2018-08-30 (×4): qty 1

## 2018-08-30 MED ORDER — SALINE SPRAY 0.65 % NA SOLN
1.0000 | NASAL | Status: DC | PRN
  Administered 2018-08-30: 1 via NASAL
  Filled 2018-08-30: qty 44

## 2018-08-30 MED ORDER — METHYLPREDNISOLONE SODIUM SUCC 125 MG IJ SOLR
60.0000 mg | Freq: Four times a day (QID) | INTRAMUSCULAR | Status: DC
  Administered 2018-08-30: 60 mg via INTRAVENOUS
  Filled 2018-08-30: qty 2

## 2018-08-30 MED ORDER — LISINOPRIL 10 MG PO TABS
10.0000 mg | ORAL_TABLET | Freq: Every day | ORAL | Status: DC
  Administered 2018-08-30 – 2018-08-31 (×2): 10 mg via ORAL
  Filled 2018-08-30 (×2): qty 1

## 2018-08-30 MED ORDER — IPRATROPIUM BROMIDE 0.02 % IN SOLN
0.5000 mg | Freq: Once | RESPIRATORY_TRACT | Status: AC
  Administered 2018-08-30: 0.5 mg via RESPIRATORY_TRACT
  Filled 2018-08-30: qty 2.5

## 2018-08-30 MED ORDER — ONDANSETRON HCL 4 MG/2ML IJ SOLN: 4.0000 mg | Freq: Four times a day (QID) | INTRAMUSCULAR | Status: DC | PRN

## 2018-08-30 MED ORDER — ENOXAPARIN SODIUM 80 MG/0.8ML ~~LOC~~ SOLN
80.0000 mg | SUBCUTANEOUS | Status: DC
  Administered 2018-08-30: 80 mg via SUBCUTANEOUS
  Filled 2018-08-30: qty 0.8

## 2018-08-30 MED ORDER — INSULIN ASPART 100 UNIT/ML ~~LOC~~ SOLN
0.0000 [IU] | Freq: Three times a day (TID) | SUBCUTANEOUS | Status: DC
  Administered 2018-08-30 (×2): 15 [IU] via SUBCUTANEOUS
  Administered 2018-08-31: 4 [IU] via SUBCUTANEOUS
  Filled 2018-08-30: qty 1

## 2018-08-30 MED ORDER — ALBUTEROL SULFATE (2.5 MG/3ML) 0.083% IN NEBU: 2.5000 mg | INHALATION_SOLUTION | RESPIRATORY_TRACT | Status: DC | PRN

## 2018-08-30 MED ORDER — INSULIN ASPART 100 UNIT/ML ~~LOC~~ SOLN: 0.0000 [IU] | Freq: Three times a day (TID) | SUBCUTANEOUS | Status: DC

## 2018-08-30 MED ORDER — ALBUTEROL SULFATE (2.5 MG/3ML) 0.083% IN NEBU
2.5000 mg | INHALATION_SOLUTION | Freq: Three times a day (TID) | RESPIRATORY_TRACT | Status: DC
  Administered 2018-08-31: 2.5 mg via RESPIRATORY_TRACT
  Filled 2018-08-30: qty 3

## 2018-08-30 MED ORDER — LISINOPRIL-HYDROCHLOROTHIAZIDE 10-12.5 MG PO TABS: 1.0000 | ORAL_TABLET | Freq: Every day | ORAL | Status: DC

## 2018-08-30 MED ORDER — ZOLPIDEM TARTRATE 5 MG PO TABS
5.0000 mg | ORAL_TABLET | Freq: Once | ORAL | Status: AC
  Administered 2018-08-30: 5 mg via ORAL
  Filled 2018-08-30: qty 1

## 2018-08-30 MED ORDER — SODIUM CHLORIDE 0.9 % IV SOLN
INTRAVENOUS | Status: AC
  Administered 2018-08-30: 11:00:00 via INTRAVENOUS

## 2018-08-30 MED ORDER — HYDRALAZINE HCL 20 MG/ML IJ SOLN
5.0000 mg | Freq: Four times a day (QID) | INTRAMUSCULAR | Status: DC | PRN
  Administered 2018-08-30: 5 mg via INTRAVENOUS
  Filled 2018-08-30 (×2): qty 1

## 2018-08-30 MED ORDER — INSULIN ASPART 100 UNIT/ML ~~LOC~~ SOLN: 0.0000 [IU] | Freq: Every day | SUBCUTANEOUS | Status: DC

## 2018-08-30 MED ORDER — ONDANSETRON HCL 4 MG PO TABS: 4.0000 mg | ORAL_TABLET | Freq: Four times a day (QID) | ORAL | Status: DC | PRN

## 2018-08-30 MED ORDER — BENZONATATE 100 MG PO CAPS
200.0000 mg | ORAL_CAPSULE | Freq: Three times a day (TID) | ORAL | Status: DC | PRN
  Administered 2018-08-30: 200 mg via ORAL
  Filled 2018-08-30: qty 2

## 2018-08-30 MED ORDER — MAGNESIUM SULFATE 2 GM/50ML IV SOLN
2.0000 g | Freq: Once | INTRAVENOUS | Status: AC
  Administered 2018-08-30: 2 g via INTRAVENOUS
  Filled 2018-08-30: qty 50

## 2018-08-30 MED ORDER — LABETALOL HCL 5 MG/ML IV SOLN
10.0000 mg | INTRAVENOUS | Status: DC | PRN
  Administered 2018-08-30: 10 mg via INTRAVENOUS
  Filled 2018-08-30: qty 4

## 2018-08-30 MED ORDER — ALBUTEROL SULFATE (2.5 MG/3ML) 0.083% IN NEBU
2.5000 mg | INHALATION_SOLUTION | Freq: Four times a day (QID) | RESPIRATORY_TRACT | Status: DC
  Administered 2018-08-30 (×3): 2.5 mg via RESPIRATORY_TRACT
  Filled 2018-08-30 (×2): qty 3

## 2018-08-30 MED ORDER — ACETAMINOPHEN 325 MG PO TABS
650.0000 mg | ORAL_TABLET | Freq: Four times a day (QID) | ORAL | Status: DC | PRN
  Administered 2018-08-30: 650 mg via ORAL
  Filled 2018-08-30: qty 2

## 2018-08-30 MED ORDER — METHYLPREDNISOLONE SODIUM SUCC 125 MG IJ SOLR
125.0000 mg | Freq: Once | INTRAMUSCULAR | Status: AC
  Administered 2018-08-30: 125 mg via INTRAVENOUS
  Filled 2018-08-30: qty 2

## 2018-08-30 MED ORDER — BENZONATATE 100 MG PO CAPS
100.0000 mg | ORAL_CAPSULE | Freq: Once | ORAL | Status: AC
  Administered 2018-08-30: 100 mg via ORAL
  Filled 2018-08-30: qty 1

## 2018-08-30 MED ORDER — MORPHINE SULFATE (PF) 4 MG/ML IV SOLN
6.0000 mg | Freq: Once | INTRAVENOUS | Status: AC
  Administered 2018-08-30: 6 mg via INTRAVENOUS
  Filled 2018-08-30: qty 2

## 2018-08-30 NOTE — ED Provider Notes (Signed)
[facility]-EMERGENCY DEPT Provider Note   CSN: [id number] Arrival date & time: 08/30/18  0007     History   Chief Complaint Chief Complaint  Patient presents with  . Shortness of Breath    HPI Maria Carpenter is a 38 y.o. female.  HPI  38 year old female with cough and dyspnea.  Symptoms worsening since around Monday or Tuesday.  Cough is nonproductive.  Persistent.  Shortness of breath is significantly worse with any type of activity.  She has been wheezing.  No unusual swelling.  No chest and left shoulder pain when she coughs only. L shoulder and L chest pain when she coughs only.   Past Medical History:  Diagnosis Date  . Anemia   . Asthma   . Chlamydia 2012   treated  . Complication of anesthesia    " they have trouble waking me up"  . Diabetes mellitus    A2DM during pregnancy. now on metformin.   . Family history of anesthesia complication    mother & daughter   . GERD (gastroesophageal reflux disease)   . Gonorrhea 2010   treated  . Hypertension   . Lupus (HCC) 10-2013  . Morbid obesity (HCC) 07/22/2011  . Pregnancy induced hypertension    pre E with pregnancies  . Rheumatoid arthritis(714.0)   . Shortness of breath   . Sleep apnea    does not use cpap  . Stroke [facility](HCC) 2012   March 2012>right side deficit (speech, upper/ower weakness)  . SVT (supraventricular tachycardia) [facility](HCC)     Patient Active Problem List   Diagnosis Date Noted  . Abnormal uterine bleeding (AUB) 07/25/2017  . SVT (supraventricular tachycardia) (HCC) 06/23/2015  . Lupus (HCC) 10/25/2013  . TIA (transient ischemic attack) 07/02/2012  . Diabetes mellitus (HCC) 07/02/2012  . Hypertension 07/02/2012  . Rheumatoid arthritis (HCC) 07/02/2012  . H/O: CVA (cerebrovascular accident) 07/02/2012  . GERD (gastroesophageal reflux disease) 07/02/2012  . Obstructive sleep apnea 07/02/2012  . CTS (carpal tunnel syndrome) 07/02/2012  . Morbid obesity (HCC) 07/22/2011    . Choledocholithiasis with acute cholecystitis 07/22/2011    Past Surgical History:  Procedure Laterality Date  . CHOLECYSTECTOMY  07/22/2011   Procedure: LAPAROSCOPIC CHOLECYSTECTOMY WITH INTRAOPERATIVE CHOLANGIOGRAM;  Surgeon: Kandis Cockingavid H Newman, MD;  Location: [facility]S;  Service: General;  Laterality: N/A;  . ERCP  07/24/2011   Procedure: ENDOSCOPIC RETROGRADE CHOLANGIOPANCREATOGRAPHY (ERCP);  Surgeon: Theda BelfastPatrick D Hung;  Location: [facility] ENDOS[facility];  Service: Endoscopy;  Laterality: N/A;  . FINGER SURGERY  1993   left ring finger to correct bone "problem"  . TUBAL LIGATION  2009     OB History    Gravida  10   Para  6   Term  0   Preterm  6   AB  3   Living  6     SAB  3   TAB  0   Ectopic  0   Multiple  0   Live Births  5            Home Medications    Prior to Admission medications   Medication Sig Start Date End Date Taking? Authorizing Provider  albuterol (PROVENTIL HFA;VENTOLIN HFA) 108 (90 BASE) MCG/ACT inhaler Inhale 2 puffs into the lungs every 6 (six) hours as needed. For wheezing    [provider]  clindamycin (CLEOCIN) 300 MG capsule Take 1 capsule (300 mg total) by mouth 4 (four) times daily. 11/01/17   Lawyer, Cristal Deerhristopher, PA-C  ibuprofen (  ADVIL,MOTRIN) 200 MG tablet Take 800 mg by mouth every 6 (six) hours as needed for moderate pain.    [provider]  lisinopril-hydrochlorothiazide (PRINZIDE,ZESTORETIC) 10-12.5 MG tablet Take 1 tablet by mouth daily. 08/13/17   Marcine Matar, MD  megestrol (MEGACE) 40 MG tablet Take 2 tablets (80 mg total) 2 (two) times daily by mouth. Patient not taking: Reported on 07/25/2017 07/03/17   Tereso Newcomer, MD  Multiple Vitamins-Minerals (MULTIVITAMIN WITH MINERALS) tablet Take 1 tablet by mouth daily.      [provider]  omeprazole (PRILOSEC) 20 MG capsule Take 1 capsule (20 mg total) by mouth daily. 08/13/17   Marcine Matar, MD  oxyCODONE-acetaminophen (PERCOCET/ROXICET) 5-325 MG  tablet Take 1 tablet by mouth every 4 (four) hours as needed for severe pain. 11/01/17   Lawyer, Cristal Deer, PA-C  Probiotic Product (PROBIOTIC PO) Take 1 capsule by mouth daily.    [provider]  propranolol (INDERAL) 10 MG tablet Take 1 tablet (10 mg total) by mouth 2 (two) times daily. 08/13/17   Marcine Matar, MD    Family History Family History  Problem Relation Age of Onset  . Anesthesia problems Mother        difficult to arouse, SOB  . Diabetes Mother   . Hypertension Mother   . Stroke Mother   . Diabetes Father   . Hypertension Father   . Heart disease Father   . Cancer Maternal Aunt   . Cancer Maternal Grandmother     Social History Social History   Tobacco Use  . Smoking status: Current Every Day Smoker    Packs/day: 0.25    Years: 4.00    Pack years: 1.00    Types: Cigarettes  . Smokeless tobacco: Never Used  . Tobacco comment: 5 cig/day  Substance Use Topics  . Alcohol use: Yes    Comment: socially  . Drug use: Yes    Types: Marijuana    Comment: last month     Allergies   Amoxicillin and Dilaudid [hydromorphone hcl]   Review of Systems Review of Systems All systems reviewed and negative, other than as noted in HPI.   Physical Exam Updated Vital Signs BP (!) 192/117 (BP Location: Right Arm)   Pulse (!) 128   Temp 100 F (37.8 C) (Oral)   Resp (!) 24   SpO2 100%   Physical Exam Vitals signs and nursing note reviewed.  Constitutional:      Appearance: She is well-developed. She is obese. She is ill-appearing.  HENT:     Head: Normocephalic and atraumatic.  Eyes:     General:        Right eye: No discharge.        Left eye: No discharge.     Conjunctiva/sclera: Conjunctivae normal.  Neck:     Musculoskeletal: Neck supple.  Cardiovascular:     Rate and Rhythm: Regular rhythm. Tachycardia present.     Heart sounds: Normal heart sounds. No murmur. No friction rub. No gallop.   Pulmonary:     Effort: Pulmonary effort is  normal. No respiratory distress.     Breath sounds: Wheezing present.  Abdominal:     General: There is no distension.     Palpations: Abdomen is soft.     Tenderness: There is no abdominal tenderness.  Musculoskeletal:        General: No tenderness.  Skin:    General: Skin is warm and dry.  Neurological:  Mental Status: She is alert.  Psychiatric:        Behavior: Behavior normal.        Thought Content: Thought content normal.      ED Treatments / Results  Labs (all labs ordered are listed, but only abnormal results are displayed) Labs Reviewed  COMPREHENSIVE METABOLIC PANEL - Abnormal; Notable for the following components:      Result Value   Sodium 133 (*)    Chloride 96 (*)    Glucose, Bld 236 (*)    Total Protein 8.5 (*)    Total Bilirubin <0.1 (*)    All other components within normal limits  URINALYSIS, ROUTINE W REFLEX MICROSCOPIC - Abnormal; Notable for the following components:   APPearance HAZY (*)    Ketones, ur TRACE (*)    Protein, ur TRACE (*)    All other components within normal limits  INFLUENZA PANEL BY PCR (TYPE A & B) - Abnormal; Notable for the following components:   Influenza B By PCR POSITIVE (*)    All other components within normal limits  HEMOGLOBIN A1C - Abnormal; Notable for the following components:   Hgb A1c MFr Bld 7.8 (*)    All other components within normal limits  GLUCOSE, CAPILLARY - Abnormal; Notable for the following components:   Glucose-Capillary 304 (*)    All other components within normal limits  BASIC METABOLIC PANEL - Abnormal; Notable for the following components:   Glucose, Bld 167 (*)    All other components within normal limits  GLUCOSE, CAPILLARY - Abnormal; Notable for the following components:   Glucose-Capillary 194 (*)    All other components within normal limits  GLUCOSE, CAPILLARY - Abnormal; Notable for the following components:   Glucose-Capillary 188 (*)    All other components within normal limits    CBG MONITORING, ED - Abnormal; Notable for the following components:   Glucose-Capillary 384 (*)    All other components within normal limits  CBG MONITORING, ED - Abnormal; Notable for the following components:   Glucose-Capillary 334 (*)    All other components within normal limits  CBC WITH DIFFERENTIAL/PLATELET  BRAIN NATRIURETIC PEPTIDE  PREGNANCY, URINE  URINALYSIS, MICROSCOPIC (REFLEX)  HIV ANTIBODY (ROUTINE TESTING W REFLEX)  CBC    EKG EKG Interpretation  Date/Time:  Saturday August 30 2018 00:43:21 EST Ventricular Rate:  107 PR Interval:    QRS Duration: 96 QT Interval:  337 QTC Calculation: 450 R Axis:   69 Text Interpretation:  Sinus tachycardia Consider right atrial enlargement Confirmed by Raeford Razor [phone]) on 08/30/2018 1:10:05 AM   Radiology Dg Chest 2 View  Result Date: 08/30/2018 CLINICAL DATA:  Acute onset of shortness of breath. EXAM: CHEST - 2 VIEW COMPARISON:  Chest radiograph and CTA of the chest performed 05/30/2017 FINDINGS: The lungs are well-aerated and clear. There is no evidence of focal opacification, pleural effusion or pneumothorax. The heart is normal in size; the mediastinal contour is within normal limits. No acute osseous abnormalities are seen. IMPRESSION: No acute cardiopulmonary process seen. Electronically Signed   By: Roanna Raider M.D.   On: 08/30/2018 00:43    Procedures Procedures (including critical care time)  Medications Ordered in ED Medications  albuterol (PROVENTIL) (2.5 MG/3ML) 0.083% nebulizer solution 5 mg (5 mg Nebulization Given 08/30/18 0052)  ipratropium (ATROVENT) nebulizer solution 0.5 mg (0.5 mg Nebulization Given 08/30/18 0052)  methylPREDNISolone sodium succinate (SOLU-MEDROL) 125 mg/2 mL injection 125 mg (125 mg Intravenous Given 08/30/18 0052)  morphine  4 MG/ML injection 6 mg (6 mg Intravenous Given 08/30/18 0053)     Initial Impression / Assessment and Plan / ED Course  I have reviewed the triage vital signs  and the nursing notes.  Pertinent labs & imaging results that were available during my care of the patient were reviewed by me and considered in my medical decision making (see chart for details).     38 year old female with flulike symptoms.  She is morbidly obese with some other underlying medical illnesses.  At this point that she needs admitted for ongoing treatment.  Symptoms after steroids.  Final Clinical Impressions(s) / ED Diagnoses   Final diagnoses:  Flu-like symptoms    ED Discharge Orders    None       Raeford Razor, MD 09/09/18 1140

## 2018-08-30 NOTE — H&P (Signed)
History and Physical  Patient Name: Maria Carpenter     ZOX:[id number]    DOB: November 30, 1980    DOA: 08/30/2018 PCP: Marcine Matar, MD  Patient coming from: Home  Chief Complaint: Fever, cough      HPI: Maria Carpenter is a 38 y.o. F with hx HTN, MO, NIDDM and JRA, questionable SLE who presents with influenza like symptoms for 4 days.  The patient was in usual state of health until 4 days ago, she was [place] the mall, someone sneezed on her.  She subsequently developed sneezing, congestion that night, followed the next day by vomiting, which gradually progressed to chest congestion, severe cough, fevers, body aches, and malaise.  In the last 24 hours, she started to notice dyspnea with exertion, not relieved with home albuterol, as well as sustained high fevers, body aches, and fatigue so she came to the ER.  ED course: -Temp 100F, heart rate 128, respirations 24, blood pressure 192/117, pulse ox 100% on room air -Na 133, K 3.8, Cr 0.8, WBC 5.4K, Hgb 13.2 - BNP 14 - Pregnancy test negative -Urinalysis without cells -Chest x-ray showed morbid obesity body habitus, but no focal airspace disease, opacity, edema. -Rapid influenza testing was positive for influenza B -ECG showed sinus tachycardia - She was given Tamiflu, Solu-medrol and IV fluids, and was still dyspneic with exertion, and so the hospitalist service were asked to evaluate for influenza   Has hx childhood asthma, never used prednisone as adult, has albuterol, never had PFTs or anything that sounds like spirometry as an adult.    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  As an adult, she claims she was told by a [facility] MD she had Lupus, has never seen a rheumatologits because she is not insured.  Does not take DMARD or again prednisone.         ROS: Review of Systems  Constitutional: Positive for chills, fever and malaise/fatigue.  HENT: Positive for congestion.   Respiratory: Positive for cough and  shortness of breath. Negative for hemoptysis, sputum production and wheezing.   Cardiovascular: Negative for chest pain, orthopnea and leg swelling.  Gastrointestinal: Positive for vomiting. Negative for abdominal pain, blood in stool, constipation, diarrhea, melena and nausea.  Genitourinary: Negative for dysuria and urgency.  Musculoskeletal: Positive for myalgias.  Neurological: Negative for loss of consciousness.  All other systems reviewed and are negative.         Past Medical History:  Diagnosis Date  . Anemia   . Asthma   . Chlamydia 2012   treated  . Complication of anesthesia    " they have trouble waking me up"  . Diabetes mellitus    A2DM during pregnancy. now on metformin.   . Family history of anesthesia complication    mother & daughter   . GERD (gastroesophageal reflux disease)   . Gonorrhea 2010   treated  . Hypertension   . Lupus (HCC) 10-2013  . Morbid obesity (HCC) 07/22/2011  . Pregnancy induced hypertension    pre E with pregnancies  . Rheumatoid arthritis(714.0)   . Shortness of breath   . Sleep apnea    does not use cpap  . Stroke [facility]) 2012   March 2012>right side deficit (speech, upper/ower weakness)  . SVT (supraventricular tachycardia) (HCC)     Past Surgical History:  Procedure Laterality Date  . CHOLECYSTECTOMY  07/22/2011   Procedure: LAPAROSCOPIC CHOLECYSTECTOMY WITH INTRAOPERATIVE CHOLANGIOGRAM;  Surgeon: Kandis Cocking, MD;  Location: [facility] ORS;  Service: General;  Laterality: N/A;  . ERCP  07/24/2011   Procedure: ENDOSCOPIC RETROGRADE CHOLANGIOPANCREATOGRAPHY (ERCP);  Surgeon: Theda Belfast;  Location: [facility] ENDOSCOPY;  Service: Endoscopy;  Laterality: N/A;  . FINGER SURGERY  1993   left ring finger to correct bone "problem"  . TUBAL LIGATION  2009    Social History: Patient lives with her family.  The patient walks unassisted.  Nonsmoker.  Allergies  Allergen Reactions  . Amoxicillin Anaphylaxis and Rash    Has patient had a PCN  reaction causing immediate rash, facial/tongue/throat swelling, SOB or lightheadedness with hypotension: Yes Has patient had a PCN reaction causing severe rash involving mucus membranes or skin necrosis: Yes Has patient had a PCN reaction that required hospitalization Yes Has patient had a PCN reaction occurring within the last 10 years: No If all of the above answers are "NO", then may proceed with Cephalosporin use.   . Dilaudid [Hydromorphone Hcl] Itching    Family history: family history includes Anesthesia problems in her mother; Cancer in her maternal aunt and maternal grandmother; Diabetes in her father and mother; Heart disease in her father; Hypertension in her father and mother; Stroke in her mother.  Prior to Admission medications   Medication Sig Start Date End Date Taking? Authorizing Provider  albuterol (PROVENTIL HFA;VENTOLIN HFA) 108 (90 BASE) MCG/ACT inhaler Inhale 2 puffs into the lungs every 6 (six) hours as needed. For wheezing   Yes [provider]  ibuprofen (ADVIL,MOTRIN) 200 MG tablet Take 800 mg by mouth every 6 (six) hours as needed for moderate pain.   Yes [provider]  lansoprazole (PREVACID) 15 MG capsule Take 15 mg by mouth daily at 12 noon.   Yes [provider]  lisinopril-hydrochlorothiazide (PRINZIDE,ZESTORETIC) 10-12.5 MG tablet Take 1 tablet by mouth daily. 08/13/17  Yes Marcine Matar, MD  Multiple Vitamin (MULTIVITAMIN WITH MINERALS) TABS tablet Take 1 tablet by mouth daily.   Yes [provider]  Probiotic Product (PROBIOTIC PO) Take 1 capsule by mouth daily.   Yes [provider]  propranolol (INDERAL) 10 MG tablet Take 1 tablet (10 mg total) by mouth 2 (two) times daily. 08/13/17  Yes Marcine Matar, MD  VITAMIN D, CHOLECALCIFEROL, PO Take 1 tablet by mouth daily.   Yes [provider]       Physical Exam: BP (!) 171/97   Pulse 85   Temp 100 F (37.8 C) (Oral)   Resp 17   SpO2 99%   General appearance: Well-developed, obese adult female, alert and in no acute distress.   Eyes: Anicteric, conjunctiva pink, lids and lashes normal. PERRL.    ENT: No nasal deformity, discharge, epistaxis.  Hearing normal. OP moist without lesions.  Dentition good, lips normal. Neck: No neck masses.  Trachea midline.  No thyromegaly/tenderness. Lymph: No cervical or supraclavicular lymphadenopathy. Skin: Warm and dry.    No suspicious rashes or lesions. Cardiac: RRR, nl S1-S2, no murmurs appreciated.  Capillary refill is brisk.  JVP not visible.  No LE edema.  Radial pulses 2+ and symmetric. Respiratory: Appears dyspneic with exertion.  No rales or wheezes at all. Abdomen: Abdomen soft.  No TTP. No ascites, distension, hepatosplenomegaly.   MSK: No deformities or effusions of the large joints of the upper or lower extremities bilaterally.  No cyanosis or clubbing. Neuro: Cranial nerves normal.  Sensation intact to light touch. Speech is fluent.  Muscle strength normal.    Psych: Sensorium intact and responding  to questions, attention normal.  Behavior appropriate.  Affect normal.  Judgment and insight appear normal.     Labs on Admission:  I have personally reviewed following labs and imaging studies: CBC: Recent Labs  Lab 08/30/18 0035  WBC 5.4  NEUTROABS 2.3  HGB 13.2  HCT 40.9  MCV 83.6  PLT 240   Basic Metabolic Panel: Recent Labs  Lab 08/30/18 0035  NA 133*  K 3.8  CL 96*  CO2 25  GLUCOSE 236*  BUN 16  CREATININE 0.83  CALCIUM 9.3   GFR: CrCl cannot be calculated (Unknown ideal weight.).  Liver Function Tests: Recent Labs  Lab 08/30/18 0035  AST 21  ALT 17  ALKPHOS 94  BILITOT <0.1*  PROT 8.5*  ALBUMIN 4.2   CBG: Recent Labs  Lab 08/30/18 0906  GLUCAP 384*           Radiological Exams on Admission: Personally reviewed CXR, summarized above: Dg Chest 2 View  Result Date: 08/30/2018 CLINICAL DATA:  Acute onset of shortness of breath. EXAM:  CHEST - 2 VIEW COMPARISON:  Chest radiograph and CTA of the chest performed 05/30/2017 FINDINGS: The lungs are well-aerated and clear. There is no evidence of focal opacification, pleural effusion or pneumothorax. The heart is normal in size; the mediastinal contour is within normal limits. No acute osseous abnormalities are seen. IMPRESSION: No acute cardiopulmonary process seen. Electronically Signed   By: Roanna RaiderJeffery  Chang M.D.   On: 08/30/2018 00:43    EKG: Independently reviewed. Rate 107, sinus tachycarida, no ST changes.          Assessment/Plan  Influenza B  Patient has classic influenza like illness.  Appears dehydrated and given her tachypnea at adimssion,    -Tamiflu 75 BID -IV fluids -Droplet isolation -Acetaminophen and Robitussing for symptomatic relief    Diabetes  Notes suggest this is diet controlled.  Not on meds. -Check Hgba1c -SSI correction insulin ordered -Will give Lantus given hyperglycemia  Hypertension  BP elevated, unlikely adherence at home, last PCP visit was 2018 -Continue home lisinopril, HCTZ, propranolol -Needs refills of BP meds at dc  OSA  -CPAP at night  Asthma, intermittent, purported  This is a questionable diagnosis, but at present I do not find she is wheezing or reporting imrpovement at home with albuterol.  May trial albuterol here to see if she gets symptomatic relief, but otherwise no role for steroids or BDs.  SLE  Possible diagnosis, no active symptoms.       DVT prophylaxis: Lovenox  Code Status: FULL  Family Communication: None present  Disposition Plan: Anticipate IV fluids and observation overnight reasonable given dyspnea on exertoin, tachycardia/dehydration and tachypnea at presetnation.  If vitals stable tomorrow, plan for d/c home. Consults called: None Admission status: OBS   At the point of initial evaluation, it is my clinical opinion that admission for OBSERVATION is reasonable and necessary because the  patient's presenting complaints in the context of their chronic conditions represent sufficient risk of deterioration or significant morbidity to constitute reasonable grounds for close observation in the hospital setting, but that the patient may be medically stable for discharge from the hospital within 24 to 48 hours.    Medical decision making: Patient seen at 7:30 AM on 08/30/2018.  What exists of the patient's chart was reviewed in depth and summarized above.  Clinical condition: stable.        Earl Liteshristopher P Alexys Lobello [facility] Pager: please page via [email or web address], enter password "[email or web address]", and identify  attending under [facility]

## 2018-08-30 NOTE — ED Triage Notes (Signed)
Patient states that she was around someone on Monday who was sick and since then has had worsening shortness of breath. Hx asthma, lupus, and blood clots. Patient states she has been unable to sleep due to the cough.

## 2018-08-30 NOTE — ED Notes (Signed)
ED TO INPATIENT HANDOFF REPORT  Name/Age/Gender Maria Carpenter 38 y.o. female  Code Status    Code Status Orders  (From admission, onward)         Start     Ordered   08/30/18 0811  Full code  Continuous     08/30/18 0811        Code Status History    Date Active Date Inactive Code Status Order ID Comments User Context   07/02/2012 0138 07/02/2012 2029 Full Code [id number]  Nicky Pugh, RN Inpatient   07/22/2011 0114 07/25/2011 2123 Full Code [id number]  Sunnie Nielsen, MD ED      Home/SNF/Other Home  Chief Complaint Asthma/Flu  Level of Care/Admitting Diagnosis ED Disposition    ED Disposition Condition Comment   Admit  Hospital Area: [facility] [[id number]]  Level of Care: Med-Surg [16]  Diagnosis: Influenza B [147829]  Admitting Physician: Briscoe Deutscher [[id number]]  Attending Physician: Briscoe Deutscher [[id number]]  PT Class (Do Not Modify): Observation [104]  PT Acc Code (Do Not Modify): Observation [10022]       Medical History Past Medical History:  Diagnosis Date  . Anemia   . Asthma   . Chlamydia 2012   treated  . Complication of anesthesia    " they have trouble waking me up"  . Diabetes mellitus    A2DM during pregnancy. now on metformin.   . Family history of anesthesia complication    mother & daughter   . GERD (gastroesophageal reflux disease)   . Gonorrhea 2010   treated  . Hypertension   . Lupus (HCC) 10-2013  . Morbid obesity (HCC) 07/22/2011  . Pregnancy induced hypertension    pre E with pregnancies  . Rheumatoid arthritis(714.0)   . Shortness of breath   . Sleep apnea    does not use cpap  . Stroke [facility]) 2012   March 2012>right side deficit (speech, upper/ower weakness)  . SVT (supraventricular tachycardia) (HCC)     Allergies Allergies  Allergen Reactions  . Amoxicillin Anaphylaxis and Rash    Has patient had a PCN reaction causing immediate rash, facial/tongue/throat swelling, SOB or  lightheadedness with hypotension: Yes Has patient had a PCN reaction causing severe rash involving mucus membranes or skin necrosis: Yes Has patient had a PCN reaction that required hospitalization Yes Has patient had a PCN reaction occurring within the last 10 years: No If all of the above answers are "NO", then may proceed with Cephalosporin use.   . Dilaudid [Hydromorphone Hcl] Itching    IV Location/Drains/Wounds Patient Lines/Drains/Airways Status   Active Line/Drains/Airways    Name:   Placement date:   Placement time:   Site:   Days:   Peripheral IV 08/30/18 Left Antecubital   08/30/18    0034    Antecubital   less than 1   Incision 07/22/11 Vagina Left   07/22/11    0445     2596   Incision 07/24/11 Abdomen Right;Mid;Upper   07/24/11    2320     2594   Incision - 4 Ports Abdomen 1: Umbilicus 2: Superior;Umbilicus;Upper 3: Right;Upper 4: Right;Lower   07/22/11    0804     2596          Labs/Imaging Results for orders placed or performed during the hospital encounter of 08/30/18 (from the past 48 hour(s))  CBC with Differential     Status: None   Collection Time: 08/30/18 12:35 AM  Result Value  Ref Range   WBC 5.4 4.0 - 10.5 K/uL   RBC 4.89 3.87 - 5.11 MIL/uL   Hemoglobin 13.2 12.0 - 15.0 g/dL   HCT 40.940.9 81.136.0 - 91.446.0 %   MCV 83.6 80.0 - 100.0 fL   MCH 27.0 26.0 - 34.0 pg   MCHC 32.3 30.0 - 36.0 g/dL   RDW 78.215.1 95.611.5 - 21.315.5 %   Platelets 240 150 - 400 K/uL   nRBC 0.0 0.0 - 0.2 %   Neutrophils Relative % 43 %   Neutro Abs 2.3 1.7 - 7.7 K/uL   Lymphocytes Relative 43 %   Lymphs Abs 2.3 0.7 - 4.0 K/uL   Monocytes Relative 13 %   Monocytes Absolute 0.7 0.1 - 1.0 K/uL   Eosinophils Relative 0 %   Eosinophils Absolute 0.0 0.0 - 0.5 K/uL   Basophils Relative 0 %   Basophils Absolute 0.0 0.0 - 0.1 K/uL   Immature Granulocytes 1 %   Abs Immature Granulocytes 0.03 0.00 - 0.07 K/uL    Comment: Performed at [facility], [address] [place], [place]  [address]  Comprehensive metabolic panel     Status: Abnormal   Collection Time: 08/30/18 12:35 AM  Result Value Ref Range   Sodium 133 (L) 135 - 145 mmol/L   Potassium 3.8 3.5 - 5.1 mmol/L   Chloride 96 (L) 98 - 111 mmol/L   CO2 25 22 - 32 mmol/L   Glucose, Bld 236 (H) 70 - 99 mg/dL   BUN 16 6 - 20 mg/dL   Creatinine, Ser 8.460.83 0.44 - 1.00 mg/dL   Calcium 9.3 8.9 - 96.210.3 mg/dL   Total Protein 8.5 (H) 6.5 - 8.1 g/dL   Albumin 4.2 3.5 - 5.0 g/dL   AST 21 15 - 41 U/L   ALT 17 0 - 44 U/L   Alkaline Phosphatase 94 38 - 126 U/L   Total Bilirubin <0.1 (L) 0.3 - 1.2 mg/dL   GFR calc non Af Amer >60 >60 mL/min   GFR calc Af Amer >60 >60 mL/min   Anion gap 12 5 - 15    Comment: Performed at [facility], [address] [place], [place] [address]  Influenza panel by PCR (type A & B)     Status: Abnormal   Collection Time: 08/30/18 12:35 AM  Result Value Ref Range   Influenza A By PCR NEGATIVE NEGATIVE   Influenza B By PCR POSITIVE (A) NEGATIVE    Comment: (NOTE) The Xpert Xpress Flu assay is intended as an aid in the diagnosis of  influenza and should not be used as a sole basis for treatment.  This  assay is FDA approved for nasopharyngeal swab specimens only. Nasal  washings and aspirates are unacceptable for Xpert Xpress Flu testing. Performed at [facility], [address] [place], [place] [address]   Brain natriuretic peptide     Status: None   Collection Time: 08/30/18 12:35 AM  Result Value Ref Range   B Natriuretic Peptide 14.3 0.0 - 100.0 pg/mL    Comment: Performed at [facility], [address] [place], [place] [address]  Urinalysis, Routine w reflex microscopic     Status: Abnormal   Collection Time: 08/30/18  1:17 AM  Result Value Ref Range   Color, Urine YELLOW YELLOW   APPearance HAZY (A) CLEAR   Specific Gravity, Urine 1.025 1.005 - 1.030   pH 6.0 5.0 - 8.0   Glucose, UA  NEGATIVE NEGATIVE mg/dL   Hgb urine  dipstick NEGATIVE NEGATIVE   Bilirubin Urine NEGATIVE NEGATIVE   Ketones, ur TRACE (A) NEGATIVE mg/dL   Protein, ur TRACE (A) NEGATIVE mg/dL   Nitrite NEGATIVE NEGATIVE   Leukocytes, UA NEGATIVE NEGATIVE    Comment: Performed at [facility], [address] [place], [place] [address]  Pregnancy, urine     Status: None   Collection Time: 08/30/18  1:17 AM  Result Value Ref Range   Preg Test, Ur NEGATIVE NEGATIVE    Comment:        THE SENSITIVITY OF THIS METHODOLOGY IS >20 mIU/mL. Performed at [facility], [address] [place], [place] [address]   Urinalysis, Microscopic (reflex)     Status: None   Collection Time: 08/30/18  1:17 AM  Result Value Ref Range   RBC / HPF 0-5 0 - 5 RBC/hpf   WBC, UA 0-5 0 - 5 WBC/hpf   Bacteria, UA NONE SEEN NONE SEEN   Squamous Epithelial / LPF 11-20 0 - 5   Urine-Other MUCOUS PRESENT     Comment: Performed at [facility], [address] [place], [place] [address]  CBG monitoring, ED     Status: Abnormal   Collection Time: 08/30/18  9:06 AM  Result Value Ref Range   Glucose-Capillary 384 (H) 70 - 99 mg/dL  CBG monitoring, ED     Status: Abnormal   Collection Time: 08/30/18 11:50 AM  Result Value Ref Range   Glucose-Capillary 334 (H) 70 - 99 mg/dL   Dg Chest 2 View  Result Date: 08/30/2018 CLINICAL DATA:  Acute onset of shortness of breath. EXAM: CHEST - 2 VIEW COMPARISON:  Chest radiograph and CTA of the chest performed 05/30/2017 FINDINGS: The lungs are well-aerated and clear. There is no evidence of focal opacification, pleural effusion or pneumothorax. The heart is normal in size; the mediastinal contour is within normal limits. No acute osseous abnormalities are seen. IMPRESSION: No acute cardiopulmonary process seen. Electronically Signed   By: Roanna RaiderJeffery  Chang M.D.   On: 08/30/2018 00:43   EKG Interpretation  Date/Time:  Saturday August 30 2018 00:43:21 EST Ventricular Rate:   107 PR Interval:    QRS Duration: 96 QT Interval:  337 QTC Calculation: 450 R Axis:   69 Text Interpretation:  Sinus tachycardia Consider right atrial enlargement Confirmed by Raeford RazorKohut, Stephen [phone]) on 08/30/2018 1:10:05 AM   Pending Labs Unresulted Labs (From admission, onward)    Start     Ordered   09/06/18 0500  Creatinine, serum  (enoxaparin (LOVENOX)    CrCl >/= 30 ml/min)  Weekly,   R    Comments:  while on enoxaparin therapy    08/30/18 0811   08/31/18 0500  Basic metabolic panel  Tomorrow morning,   R     08/30/18 0811   08/31/18 0500  CBC  Tomorrow morning,   R     08/30/18 0811   08/30/18 0811  HIV antibody (Routine Testing)  Add-on,   R     08/30/18 0811   08/30/18 0811  Hemoglobin A1c  Add-on,   R    Comments:  To assess prior glycemic control    08/30/18 0811          Vitals/Pain Today's Vitals   08/30/18 0730 08/30/18 0907 08/30/18 1038 08/30/18 1132  BP: (!) 171/97   (!) 180/92  Pulse: 73 85  72  Resp: 16 17  15   Temp:  TempSrc:      SpO2: 95% 99%  96%  Weight:   (!) 163.3 kg   Height:    (1.702 m)   PainSc:        Isolation Precautions Droplet precaution  Medications Medications  oseltamivir (TAMIFLU) capsule 75 mg (75 mg Oral Given 08/30/18 1045)  insulin aspart (novoLOG) injection 0-5 Units (has no administration in time range)  propranolol (INDERAL) tablet 10 mg (10 mg Oral Given 08/30/18 1044)  pantoprazole (PROTONIX) EC tablet 20 mg (20 mg Oral Given 08/30/18 1131)  enoxaparin (LOVENOX) injection 80 mg (80 mg Subcutaneous Given 08/30/18 1132)  insulin glargine (LANTUS) injection 10 Units (10 Units Subcutaneous Given 08/30/18 1045)  insulin aspart (novoLOG) injection 0-20 Units (has no administration in time range)  0.9 %  sodium chloride infusion ( Intravenous New Bag/Given 08/30/18 1037)  acetaminophen (TYLENOL) tablet 650 mg (has no administration in time range)    Or  acetaminophen (TYLENOL) suppository 650 mg (has no administration in  time range)  ondansetron (ZOFRAN) tablet 4 mg (has no administration in time range)    Or  ondansetron (ZOFRAN) injection 4 mg (has no administration in time range)  albuterol (PROVENTIL) (2.5 MG/3ML) 0.083% nebulizer solution 2.5 mg (has no administration in time range)  albuterol (PROVENTIL) (2.5 MG/3ML) 0.083% nebulizer solution 2.5 mg (2.5 mg Nebulization Given 08/30/18 0907)  hydrALAZINE (APRESOLINE) injection 5 mg (has no administration in time range)  guaiFENesin-dextromethorphan (ROBITUSSIN DM) 100-10 MG/5ML syrup 5 mL (has no administration in time range)  lisinopril (PRINIVIL,ZESTRIL) tablet 10 mg (10 mg Oral Given 08/30/18 1131)    And  hydrochlorothiazide (MICROZIDE) capsule 12.5 mg (12.5 mg Oral Given 08/30/18 1131)  albuterol (PROVENTIL) (2.5 MG/3ML) 0.083% nebulizer solution 5 mg (5 mg Nebulization Given 08/30/18 0052)  ipratropium (ATROVENT) nebulizer solution 0.5 mg (0.5 mg Nebulization Given 08/30/18 0052)  methylPREDNISolone sodium succinate (SOLU-MEDROL) 125 mg/2 mL injection 125 mg (125 mg Intravenous Given 08/30/18 0052)  morphine 4 MG/ML injection 6 mg (6 mg Intravenous Given 08/30/18 0053)  albuterol (PROVENTIL) (2.5 MG/3ML) 0.083% nebulizer solution 5 mg (5 mg Nebulization Given 08/30/18 0224)  benzonatate (TESSALON) capsule 100 mg (100 mg Oral Given 08/30/18 0225)  acetaminophen (TYLENOL) tablet 650 mg (650 mg Oral Given 08/30/18 0225)  magnesium sulfate IVPB 2 g 50 mL ( Intravenous Stopped 08/30/18 0545)    Mobility walks

## 2018-08-31 LAB — GLUCOSE, CAPILLARY: Glucose-Capillary: 188 mg/dL — ABNORMAL HIGH (ref 70–99)

## 2018-08-31 LAB — CBC
HCT: 39 % (ref 36.0–46.0)
Hemoglobin: 12.4 g/dL (ref 12.0–15.0)
MCH: 26.1 pg (ref 26.0–34.0)
MCHC: 31.8 g/dL (ref 30.0–36.0)
MCV: 82.1 fL (ref 80.0–100.0)
Platelets: 225 10*3/uL (ref 150–400)
RBC: 4.75 MIL/uL (ref 3.87–5.11)
RDW: 15.1 % (ref 11.5–15.5)
WBC: 6.6 10*3/uL (ref 4.0–10.5)
nRBC: 0 % (ref 0.0–0.2)

## 2018-08-31 LAB — BASIC METABOLIC PANEL
Anion gap: 10 (ref 5–15)
BUN: 15 mg/dL (ref 6–20)
CO2: 24 mmol/L (ref 22–32)
Calcium: 8.9 mg/dL (ref 8.9–10.3)
Chloride: 101 mmol/L (ref 98–111)
Creatinine, Ser: 0.58 mg/dL (ref 0.44–1.00)
GFR calc Af Amer: >60 mL/min (ref >60)
GFR calc non Af Amer: >60 mL/min (ref >60)
Glucose, Bld: 167 mg/dL — ABNORMAL HIGH (ref 70–99)
Potassium: 3.8 mmol/L (ref 3.5–5.1)
Sodium: 135 mmol/L (ref 135–145)

## 2018-08-31 LAB — HIV ANTIBODY (ROUTINE TESTING W REFLEX): HIV Screen 4th Generation wRfx: NONREACTIVE

## 2018-08-31 MED ORDER — HYDROCHLOROTHIAZIDE 12.5 MG PO CAPS: 25.0000 mg | ORAL_CAPSULE | Freq: Every day | ORAL | 0 refills | Status: DC

## 2018-08-31 MED ORDER — DM-GUAIFENESIN ER 30-600 MG PO TB12: 2.0000 | ORAL_TABLET | Freq: Two times a day (BID) | ORAL | 0 refills | Status: DC | PRN

## 2018-08-31 MED ORDER — PREDNISONE 10 MG PO TABS: 20.0000 mg | ORAL_TABLET | Freq: Every day | ORAL | 0 refills | Status: DC

## 2018-08-31 MED ORDER — ACETAMINOPHEN 325 MG PO TABS: 650.0000 mg | ORAL_TABLET | Freq: Four times a day (QID) | ORAL | 0 refills | Status: DC | PRN

## 2018-08-31 MED ORDER — LISINOPRIL 10 MG PO TABS: 10.0000 mg | ORAL_TABLET | Freq: Every day | ORAL | 0 refills | Status: DC

## 2018-08-31 MED ORDER — OSELTAMIVIR PHOSPHATE 75 MG PO CAPS: 75.0000 mg | ORAL_CAPSULE | Freq: Two times a day (BID) | ORAL | 0 refills | Status: DC

## 2018-08-31 MED ORDER — ALBUTEROL SULFATE HFA 108 (90 BASE) MCG/ACT IN AERS: 2.0000 | INHALATION_SPRAY | Freq: Four times a day (QID) | RESPIRATORY_TRACT | 0 refills | Status: DC | PRN

## 2018-08-31 MED ORDER — LORATADINE 10 MG PO TABS: 10.0000 mg | ORAL_TABLET | Freq: Every day | ORAL | 0 refills | Status: DC

## 2018-08-31 MED ORDER — METFORMIN HCL 500 MG PO TABS: 500.0000 mg | ORAL_TABLET | Freq: Two times a day (BID) | ORAL | 0 refills | Status: DC

## 2018-08-31 NOTE — Care Management Note (Addendum)
Case Management Note  Patient Details  Name: Maria Carpenter MRN: [id number] Date of Birth: 10/23/1980  Subjective/Objective:    Influenza B                Action/Plan: NCM spoke to pt and states [facility]. Provided pt with brochure for [facility] or [facility] to call and arrange follow up appt. States she is on SSI and receives a check each month. States it has been over a year since she applied for Medicaid. Provided pt with MATCH letter to use once per year with a $3 copay.   Expected Discharge Date:  08/31/18               Expected Discharge Plan:  Home/Self Care  In-House Referral:  NA  Discharge planning Services  CM Consult, Medication Assistance, MATCH Program, Indigent Health Clinic  Post Acute Care Choice:  NA Choice offered to:  NA  DME Arranged:  N/A DME Agency:  NA  HH Arranged:  NA HH Agency:  NA  Status of Service:  Completed, signed off  If discussed at Long Length of Stay Meetings, dates discussed:    Additional Comments:  Elliot CousinShavis, Azriel Jakob Ellen, RN 08/31/2018, 11:19 AM   08/31/2018 7:00 pm received call that MATCH date was incorrect. Contacted pt and explained NCM updated letter and reached out to [facility]. Isidoro DonningAlesia Jillianne Gamino RN CCM Case Mgmt phone [phone]  09/01/2018 11 am pt reports she was able to pick up meds from [facility] last night using her MATCH.Isidoro DonningAlesia Melessia Kaus RN CCM Case Mgmt phone [phone]

## 2018-08-31 NOTE — Discharge Summary (Signed)
Physician Discharge Summary  Maria Carpenter NFA:[id number] DOB: 06/27/1981 DOA: 08/30/2018  PCP: Marcine MatarJohnson, Deborah B, MD  Admit date: 08/30/2018 Discharge date: 08/31/2018  Admitted From: Home  Disposition:  Home   Recommendations for Outpatient Follow-up:  1. Follow up with PCP in 1-2 weeks 2. Please obtain BMP/CBC in one week 3. Needs referral for mammogram and Rheumatologist 4. Titrate DM medications.      Discharge Condition: Stable.  CODE STATUS: Full code.  Diet recommendation: Heart Healthy a  Brief/Interim Summary: Maria Carpenter is a 38 y.o. F with hx HTN, MO, NIDDM and JRA, questionable SLE who presents with influenza like symptoms for 4 days.  The patient was in usual state of health until 4 days ago, she was at the mall, someone sneezed on her.  She subsequently developed sneezing, congestion that night, followed the next day by vomiting, which gradually progressed to chest congestion, severe cough, fevers, body aches, and malaise.  Patient was found to be positive for influenza B.   1-Influenza Positive Started on Tamiflu.  She has remain on RA, breathing better. Feels she can go home.   2-DM; start metformin. Need to be increase outpatient.  HB A1c at 7.   3-Asthma; exacerbation Improved with IV solumedrol.  Feels better. No wheezing.  Discharge on prednisone. Albuterol.   OSA; needs assistance to get CPAP.   SLE; needs referral to Rheumatologist   Discharge Diagnoses:  Principal Problem:   Influenza B Active Problems:   Morbid obesity (HCC)   Diabetes mellitus (HCC)   Hypertension   Obstructive sleep apnea    Discharge Instructions  Discharge Instructions    Diet - low sodium heart healthy   Complete by:  As directed    Increase activity slowly   Complete by:  As directed      Allergies as of 08/31/2018      Reactions   Amoxicillin Anaphylaxis, Rash   Has patient had a PCN reaction causing immediate rash, facial/tongue/throat swelling,  SOB or lightheadedness with hypotension: Yes Has patient had a PCN reaction causing severe rash involving mucus membranes or skin necrosis: Yes Has patient had a PCN reaction that required hospitalization Yes Has patient had a PCN reaction occurring within the last 10 years: No If all of the above answers are "NO", then may proceed with Cephalosporin use.   Dilaudid [hydromorphone Hcl] Itching      Medication List    STOP taking these medications   lisinopril-hydrochlorothiazide 10-12.5 MG tablet Commonly known as:  PRINZIDE,ZESTORETIC     TAKE these medications   acetaminophen 325 MG tablet Commonly known as:  TYLENOL Take 2 tablets (650 mg total) by mouth every 6 (six) hours as needed for mild pain (or Fever >/= 101).   albuterol 108 (90 Base) MCG/ACT inhaler Commonly known as:  PROVENTIL HFA;VENTOLIN HFA Inhale 2 puffs into the lungs every 6 (six) hours as needed. For wheezing   dextromethorphan-guaiFENesin 30-600 MG 12hr tablet Commonly known as:  MUCINEX DM Take 2 tablets by mouth 2 (two) times daily as needed for cough.   hydrochlorothiazide 12.5 MG capsule Commonly known as:  MICROZIDE Take 2 capsules (25 mg total) by mouth daily.   ibuprofen 200 MG tablet Commonly known as:  ADVIL,MOTRIN Take 800 mg by mouth every 6 (six) hours as needed for moderate pain.   lansoprazole 15 MG capsule Commonly known as:  PREVACID Take 15 mg by mouth daily at 12 noon.   lisinopril 10 MG tablet Commonly known  as:  PRINIVIL,ZESTRIL Take 1 tablet (10 mg total) by mouth daily. Start taking on:  September 01, 2018   loratadine 10 MG tablet Commonly known as:  CLARITIN Take 1 tablet (10 mg total) by mouth daily. Start taking on:  September 01, 2018   metFORMIN 500 MG tablet Commonly known as:  GLUCOPHAGE Take 1 tablet (500 mg total) by mouth 2 (two) times daily with a meal.   multivitamin with minerals Tabs tablet Take 1 tablet by mouth daily.   oseltamivir 75 MG capsule Commonly  known as:  TAMIFLU Take 1 capsule (75 mg total) by mouth 2 (two) times daily.   predniSONE 10 MG tablet Commonly known as:  DELTASONE Take 2 tablets (20 mg total) by mouth daily.   PROBIOTIC PO Take 1 capsule by mouth daily.   propranolol 10 MG tablet Commonly known as:  INDERAL Take 1 tablet (10 mg total) by mouth 2 (two) times daily.   VITAMIN D (CHOLECALCIFEROL) PO Take 1 tablet by mouth daily.       Allergies  Allergen Reactions  . Amoxicillin Anaphylaxis and Rash    Has patient had a PCN reaction causing immediate rash, facial/tongue/throat swelling, SOB or lightheadedness with hypotension: Yes Has patient had a PCN reaction causing severe rash involving mucus membranes or skin necrosis: Yes Has patient had a PCN reaction that required hospitalization Yes Has patient had a PCN reaction occurring within the last 10 years: No If all of the above answers are "NO", then may proceed with Cephalosporin use.   . Dilaudid [Hydromorphone Hcl] Itching    Consultations:  None   Procedures/Studies: Dg Chest 2 View  Result Date: 08/30/2018 CLINICAL DATA:  Acute onset of shortness of breath. EXAM: CHEST - 2 VIEW COMPARISON:  Chest radiograph and CTA of the chest performed 05/30/2017 FINDINGS: The lungs are well-aerated and clear. There is no evidence of focal opacification, pleural effusion or pneumothorax. The heart is normal in size; the mediastinal contour is within normal limits. No acute osseous abnormalities are seen. IMPRESSION: No acute cardiopulmonary process seen. Electronically Signed   By: Roanna Raider M.D.   On: 08/30/2018 00:43     Subjective: Breathing better, cough improved. She is on RA.  Willing to go home   Discharge Exam: Vitals:   08/31/18 0504 08/31/18 0820  BP: (!) 143/84   Pulse: 71   Resp: (!) 21   Temp: 98.1 F (36.7 C)   SpO2: 99% 98%   Vitals:   08/30/18 2043 08/30/18 2344 08/31/18 0504 08/31/18 0820  BP:  (!) 151/71 (!) 143/84   Pulse:    71   Resp:   (!) 21   Temp:   98.1 F (36.7 C)   TempSrc:   Oral   SpO2: 97%  99% 98%  Weight:      Height:        General: Pt is alert, awake, not in acute distress Cardiovascular: RRR, S1/S2 +, no rubs, no gallops Respiratory: CTA bilaterally, no wheezing, no rhonchi Abdominal: Soft, NT, ND, bowel sounds + Extremities: no edema, no cyanosis    The results of significant diagnostics from this hospitalization (including imaging, microbiology, ancillary and laboratory) are listed below for reference.     Microbiology: No results found for this or any previous visit (from the past 240 hour(s)).   Labs: BNP (last 3 results) Recent Labs    08/30/18 0035  BNP 14.3   Basic Metabolic Panel: Recent Labs  Lab 08/30/18 0035 08/31/18  0615  NA 133* 135  K 3.8 3.8  CL 96* 101  CO2 25 24  GLUCOSE 236* 167*  BUN 16 15  CREATININE 0.83 0.58  CALCIUM 9.3 8.9   Liver Function Tests: Recent Labs  Lab 08/30/18 0035  AST 21  ALT 17  ALKPHOS 94  BILITOT <0.1*  PROT 8.5*  ALBUMIN 4.2   No results for input(s): LIPASE, AMYLASE in the last 168 hours. No results for input(s): AMMONIA in the last 168 hours. CBC: Recent Labs  Lab 08/30/18 0035 08/31/18 0615  WBC 5.4 6.6  NEUTROABS 2.3  --   HGB 13.2 12.4  HCT 40.9 39.0  MCV 83.6 82.1  PLT 240 225   Cardiac Enzymes: No results for input(s): CKTOTAL, CKMB, CKMBINDEX, TROPONINI in the last 168 hours. BNP: Invalid input(s): POCBNP CBG: Recent Labs  Lab 08/30/18 0906 08/30/18 1150 08/30/18 1540 08/30/18 2009 08/31/18 0723  GLUCAP 384* 334* 304* 194* 188*   D-Dimer No results for input(s): DDIMER in the last 72 hours. Hgb A1c Recent Labs    08/30/18 1423  HGBA1C 7.8*   Lipid Profile No results for input(s): CHOL, HDL, LDLCALC, TRIG, CHOLHDL, LDLDIRECT in the last 72 hours. Thyroid function studies No results for input(s): TSH, T4TOTAL, T3FREE, THYROIDAB in the last 72 hours.  Invalid input(s):  FREET3 Anemia work up No results for input(s): VITAMINB12, FOLATE, FERRITIN, TIBC, IRON, RETICCTPCT in the last 72 hours. Urinalysis    Component Value Date/Time   COLORURINE YELLOW 08/30/2018 0117   APPEARANCEUR HAZY (A) 08/30/2018 0117   LABSPEC 1.025 08/30/2018 0117   PHURINE 6.0 08/30/2018 0117   GLUCOSEU NEGATIVE 08/30/2018 0117   HGBUR NEGATIVE 08/30/2018 0117   BILIRUBINUR NEGATIVE 08/30/2018 0117   KETONESUR TRACE (A) 08/30/2018 0117   PROTEINUR TRACE (A) 08/30/2018 0117   UROBILINOGEN 0.2 06/11/2015 1340   NITRITE NEGATIVE 08/30/2018 0117   LEUKOCYTESUR NEGATIVE 08/30/2018 0117   Sepsis Labs Invalid input(s): PROCALCITONIN,  WBC,  LACTICIDVEN Microbiology No results found for this or any previous visit (from the past 240 hour(s)).   Time coordinating discharge; 35 minutes.   SIGNED:   Alba Cory, MD  [facility] 08/31/2018, 9:52 AM Pager   If 7PM-7AM, please contact night-coverage [email or web address] Password [email or web address]

## 2018-11-27 ENCOUNTER — Telehealth: Payer: Self-pay | Admitting: Physician Assistant

## 2018-11-27 DIAGNOSIS — N9419 Other specified dyspareunia: Secondary | ICD-10-CM

## 2018-11-27 NOTE — Progress Notes (Signed)
Based on what you shared with me, I feel your condition warrants further evaluation and I recommend that you be seen for a face to face office visit.     NOTE: If you entered your credit card information for this eVisit, you will not be charged. You may see a "hold" on your card for the $35 but that hold will drop off and you will not have a charge processed.  If you are having a true medical emergency please call 911.  If you need an urgent face to face visit, [facility] has four urgent care centers for your convenience.    PLEASE NOTE: THE [facility] LOCATIONS AND URGENT CARE CLINICS DO NOT HAVE THE TESTING FOR CORONAVIRUS COVID19 AVAILABLE.  IF YOU FEEL YOU NEED THIS TEST YOU MUST GO TO A TRIAGE LOCATION AT ONE OF THE HOSPITAL EMERGENCY DEPARTMENTS   [email or web address] to reserve your spot online an avoid wait times  [facility] [address], [address] [place], [place] [address] Modified hours of operation: Monday-Friday, 10 AM to 6 PM  Saturday & Sunday 10 AM to 4 PM *Across the street from [employer]  [facility] (New Address!) [address], [address] [place], [place] [address] *Just off [address], across the road from [employer]* Modified hours of operation: Monday-Friday, 10 AM to 5 PM  Closed Saturday & Sunday   The following sites will take your insurance:  . [facility]  [phone] Get Driving Directions Find a Provider at this Location  [address] [place], [place] [address] . 10 am to 8 pm Monday-Friday . 12 pm to 8 pm Saturday-Sunday   . [facility]  [phone] Get Driving Directions Find a Provider at this Location  [address], [address] , [place] [address] . 8 am to 8 pm Monday-Friday . 9 am to 6 pm Saturday . 11 am to 6 pm Sunday   . [facility] [place]  [phone] Get Driving Directions  [address] [place], [place] [address] . 8 am to 8 pm Monday-Friday . 8 am to 4 pm Saturday-Sunday   Your e-visit answers were reviewed by a board certified advanced clinical practitioner to complete your personal care plan.  Thank you for using e-Visits. 

## 2018-11-28 ENCOUNTER — Inpatient Hospital Stay (HOSPITAL_COMMUNITY)
Admission: AD | Admit: 2018-11-28 | Discharge: 2018-11-28 | Disposition: A | Discharge status: Home / Self Care | Payer: Medicaid Other | Source: Ambulatory Visit | Attending: Obstetrics & Gynecology | Admitting: Obstetrics & Gynecology

## 2018-11-28 ENCOUNTER — Other Ambulatory Visit: Payer: Self-pay

## 2018-11-28 DIAGNOSIS — N939 Abnormal uterine and vaginal bleeding, unspecified: Secondary | ICD-10-CM | POA: Insufficient documentation

## 2018-11-28 DIAGNOSIS — Z3202 Encounter for pregnancy test, result negative: Secondary | ICD-10-CM

## 2018-11-28 LAB — POCT PREGNANCY, URINE: Preg Test, Ur: NEGATIVE

## 2018-11-28 NOTE — Progress Notes (Signed)
Maria Carpenter CNM discussed d/c plan. Pt opted to go to Urgent Care in AM rather than ED. [place] and verbal d/c instructions given and understanding voiced

## 2018-11-28 NOTE — MAU Note (Signed)
LMP 10/30/10 Started bleeding around 3/22 started spotting. Bleeding has gotten heavier, esp if I am active. Since Tues passing clots and now am feeling wk. Use 6 overnight pads today. Having a lot of cramping, esp in lower back. Pain in abd is sharp. Has had irreg bleeding in past and took med but had to stop due to making B/P too high. Hx htn and Lupus

## 2018-11-28 NOTE — Discharge Instructions (Signed)
Abnormal Uterine Bleeding  Abnormal uterine bleeding means bleeding more than usual from your uterus. It can include:   Bleeding between periods.   Bleeding after sex.   Bleeding that is heavier than normal.   Periods that last longer than usual.   Bleeding after you have stopped having your period (menopause).  There are many problems that may cause this. You should see a doctor for any kind of bleeding that is not normal. Treatment depends on the cause of the bleeding.  Follow these instructions at home:   Watch your condition for any changes.   Do not use tampons, douche, or have sex, if your doctor tells you not to.   Change your pads often.   Get regular well-woman exams. Make sure they include a pelvic exam and cervical cancer screening.   Keep all follow-up visits as told by your doctor. This is important.  Contact a doctor if:   The bleeding lasts more than one week.   You feel dizzy at times.   You feel like you are going to throw up (nauseous).   You throw up.  Get help right away if:   You pass out.   You have to change pads every hour.   You have belly (abdominal) pain.   You have a fever.   You get sweaty.   You get weak.   You passing large blood clots from your vagina.  Summary   Abnormal uterine bleeding means bleeding more than usual from your uterus.   There are many problems that may cause this. You should see a doctor for any kind of bleeding that is not normal.   Treatment depends on the cause of the bleeding.  This information is not intended to replace advice given to you by your health care provider. Make sure you discuss any questions you have with your health care provider.  Document Released: 06/10/2009 Document Revised: 08/07/2016 Document Reviewed: 08/07/2016  Elsevier Interactive Patient Education  2019 Elsevier Inc.

## 2018-11-28 NOTE — MAU Provider Note (Signed)
  S Ms. KORIANNA KAMER is a 38 y.o. [phone] non-pregnant female who presents to MAU today with complaint of abnormal uterine bleeding. This is a recurrent problem. Patient is concerned she is pregnant. She is remote from BTL. Patient endorses "normal period" around 11/06/18 but states she started spotting again 11/19/18 and has continued to experience spotting since that time. She states her abdominal cramping and sense of bleeding are positional.   Patient completed a virtual clinic visit yesterday for these complaints and was advised to pursue evaluation in MAU.  She declines transfer to ED as she is "scared to sit in the ED around all those sick people with my Lupus".  She denies SOB, weakness and syncope.   O BP (!) 151/101 (BP Location: Right Arm)   Pulse (!) 105   Temp 97.9 F (36.6 C)   Resp 18   Ht 5\' 7"  (1.702 m)   Wt (!) 166.5 kg   SpO2 100%   BMI 57.48 kg/m     Physical Exam  Nursing note and vitals reviewed. Constitutional: She is oriented to person, place, and time. She appears well-developed and well-nourished.  Cardiovascular: Intact distal pulses.  Respiratory: Effort normal. No accessory muscle usage. No respiratory distress.  No dyspnea on exertion. No signs of air hunger. Patient able to speak in full sentences without interruption r/t increased work of breathing  GI: Soft. She exhibits no distension. There is no abdominal tenderness. There is no rebound and no guarding.  Musculoskeletal: Normal range of motion.  Neurological: She is alert and oriented to person, place, and time. She has normal strength. She displays a negative Romberg sign.  Skin: Skin is warm and dry.  Psychiatric: She has a normal mood and affect. Her behavior is normal. Judgment and thought content normal.    A Non pregnant female Medical screening exam complete Abnormal uterine bleeding  P Discharge from MAU in stable condition Patient given the option of transfer to [facility] for  further evaluation or seek care in outpatient facility of choice. List of options for follow-up given  Warning signs for worsening condition that would warrant emergency follow-up discussed   Briant Sites 11/28/2018 11:12 PM

## 2018-11-28 NOTE — MAU Note (Signed)
Thalia Bloodgood CNM in Triage to see pt. And discuss plan of care

## 2019-03-26 ENCOUNTER — Ambulatory Visit: Payer: Medicaid Other | Admitting: Internal Medicine

## 2019-03-26 ENCOUNTER — Other Ambulatory Visit: Payer: Self-pay

## 2019-03-26 ENCOUNTER — Encounter: Payer: Self-pay | Admitting: Internal Medicine

## 2019-03-26 VITALS — BP 188/107 | HR 97 | Temp 98.3°F | Ht 67.0 in | Wt 370.8 lb

## 2019-03-26 DIAGNOSIS — Z803 Family history of malignant neoplasm of breast: Secondary | ICD-10-CM | POA: Diagnosis not present

## 2019-03-26 DIAGNOSIS — Z7984 Long term (current) use of oral hypoglycemic drugs: Secondary | ICD-10-CM | POA: Diagnosis not present

## 2019-03-26 DIAGNOSIS — N6321 Unspecified lump in the left breast, upper outer quadrant: Secondary | ICD-10-CM

## 2019-03-26 DIAGNOSIS — K219 Gastro-esophageal reflux disease without esophagitis: Secondary | ICD-10-CM

## 2019-03-26 DIAGNOSIS — N63 Unspecified lump in unspecified breast: Secondary | ICD-10-CM | POA: Insufficient documentation

## 2019-03-26 DIAGNOSIS — E119 Type 2 diabetes mellitus without complications: Secondary | ICD-10-CM

## 2019-03-26 DIAGNOSIS — N6320 Unspecified lump in the left breast, unspecified quadrant: Secondary | ICD-10-CM | POA: Diagnosis not present

## 2019-03-26 DIAGNOSIS — I10 Essential (primary) hypertension: Secondary | ICD-10-CM

## 2019-03-26 DIAGNOSIS — I471 Supraventricular tachycardia: Secondary | ICD-10-CM

## 2019-03-26 DIAGNOSIS — Z79899 Other long term (current) drug therapy: Secondary | ICD-10-CM | POA: Diagnosis not present

## 2019-03-26 DIAGNOSIS — Z91120 Patient's intentional underdosing of medication regimen due to financial hardship: Secondary | ICD-10-CM | POA: Diagnosis not present

## 2019-03-26 DIAGNOSIS — N6311 Unspecified lump in the right breast, upper outer quadrant: Secondary | ICD-10-CM | POA: Diagnosis not present

## 2019-03-26 DIAGNOSIS — E1169 Type 2 diabetes mellitus with other specified complication: Secondary | ICD-10-CM

## 2019-03-26 DIAGNOSIS — Z8041 Family history of malignant neoplasm of ovary: Secondary | ICD-10-CM | POA: Diagnosis not present

## 2019-03-26 DIAGNOSIS — J45909 Unspecified asthma, uncomplicated: Secondary | ICD-10-CM

## 2019-03-26 DIAGNOSIS — N6310 Unspecified lump in the right breast, unspecified quadrant: Secondary | ICD-10-CM | POA: Diagnosis not present

## 2019-03-26 DIAGNOSIS — R109 Unspecified abdominal pain: Secondary | ICD-10-CM | POA: Diagnosis not present

## 2019-03-26 LAB — POCT GLYCOSYLATED HEMOGLOBIN (HGB A1C): Hemoglobin A1C: 8.7 % — AB (ref 4.0–5.6)

## 2019-03-26 LAB — GLUCOSE, CAPILLARY: Glucose-Capillary: 184 mg/dL — ABNORMAL HIGH (ref 70–99)

## 2019-03-26 MED ORDER — HYDROCHLOROTHIAZIDE 12.5 MG PO CAPS: 25.0000 mg | ORAL_CAPSULE | Freq: Every day | ORAL | 2 refills | Status: DC

## 2019-03-26 MED ORDER — METFORMIN HCL 500 MG PO TABS: 500.0000 mg | ORAL_TABLET | Freq: Two times a day (BID) | ORAL | 2 refills | Status: DC

## 2019-03-26 MED ORDER — METFORMIN HCL 500 MG PO TABS: 500.0000 mg | ORAL_TABLET | Freq: Two times a day (BID) | ORAL | 0 refills | Status: DC

## 2019-03-26 MED ORDER — PROPRANOLOL HCL 10 MG PO TABS: 10.0000 mg | ORAL_TABLET | Freq: Two times a day (BID) | ORAL | 5 refills | Status: DC

## 2019-03-26 MED ORDER — LISINOPRIL 10 MG PO TABS: 10.0000 mg | ORAL_TABLET | Freq: Every day | ORAL | 0 refills | Status: DC

## 2019-03-26 MED ORDER — LANSOPRAZOLE 15 MG PO CPDR: 15.0000 mg | DELAYED_RELEASE_CAPSULE | Freq: Every day | ORAL | 2 refills | Status: DC

## 2019-03-26 MED ORDER — LISINOPRIL 10 MG PO TABS: 10.0000 mg | ORAL_TABLET | Freq: Every day | ORAL | 2 refills | Status: DC

## 2019-03-26 MED ORDER — HYDROCHLOROTHIAZIDE 12.5 MG PO CAPS: 25.0000 mg | ORAL_CAPSULE | Freq: Every day | ORAL | 0 refills | Status: DC

## 2019-03-26 MED ORDER — ALBUTEROL SULFATE HFA 108 (90 BASE) MCG/ACT IN AERS: 2.0000 | INHALATION_SPRAY | Freq: Four times a day (QID) | RESPIRATORY_TRACT | 3 refills | Status: DC | PRN

## 2019-03-26 NOTE — Assessment & Plan Note (Signed)
-  Sent refill for Prevacid

## 2019-03-26 NOTE — Assessment & Plan Note (Signed)
Patient ran out of her antihypertensive medications about a month ago was not able to get them since she did not have insurance. She mentions that her blood pressure at home goes up to 200/100 sometimes. She denies headache, lightheadedness, dizziness.   -Sending refill for HCTZ 25 mg daily and lisinopril 10 mg daily 4 months ago and was not able to  -BMP today -Follow-up in clinic in 2 weeks

## 2019-03-26 NOTE — Assessment & Plan Note (Signed)
-  Sent refill for albuterol today

## 2019-03-26 NOTE — Progress Notes (Signed)
    CC: Establish care  HPI:  [name] is a 38 y.o. with PMHx as documented below, came in to establish care and management of chronic condition. Please refer to problem based charting for further details and assessment and plan of current problem and chronic medical conditions.  Past Medical History:  Diagnosis Date  . Anemia   . Asthma   . Chlamydia 2012   treated  . Complication of anesthesia    " they have trouble waking me up"  . Diabetes mellitus    A2DM during pregnancy. now on metformin.   . Family history of anesthesia complication    mother & daughter   . GERD (gastroesophageal reflux disease)   . Gonorrhea 2010   treated  . Hypertension   . Lupus ([place]) 10-2013  . Morbid obesity ([facility]) 07/22/2011  . Pregnancy induced hypertension    pre E with pregnancies  . Rheumatoid arthritis(714.0)   . Shortness of breath   . Sleep apnea    does not use cpap  . Stroke [facility]) 2012   March 2012>right side deficit (speech, upper/ower weakness)  . SVT (supraventricular tachycardia) (HCC)    Review of Systems:   Review of Systems  Constitutional: Negative for chills and fever.  Cardiovascular: Positive for chest pain and palpitations.  Genitourinary: Positive for flank pain. Negative for dysuria and urgency.    Physical Exam:  Vitals:   03/26/19 1337  BP: (!) 188/107  Pulse: 97  Temp: 98.3 F (36.8 C)  TempSrc: Oral  SpO2: 100%  Weight: (!) 370 lb 12.8 oz (168.2 kg)  Height: 5\' 7"  (1.702 m)   Vital signs reviewed.  Nursing note reviewed General: Obese pleasant lady, sitting on the chair in no acute distress CV: RRR, normal S1-S2, no murmur Chest/lungs: Small, soft, mobile breast lump at right upper quadrant of left breast Right soft , small, nontender lymph adenopathy at right axilla  Assessment & Plan:   See Encounters Tab for problem based charting.  Patient discussed with Dr. Evette Doffing

## 2019-03-26 NOTE — Assessment & Plan Note (Signed)
Patient reports history of bilateral breast lump that she noticed few months ago.  Mammography was not performed due to lack of insurance.  She also reports family history of breast cancers and ovarian cancers in her aunts and cousins. She has a mobile soft small mass/lump at right upper quadrant of left breast and smaller mass at right breast. Suspected right axillary soft mobile non tender lymph node  - Bilateral diagnostic mammogram

## 2019-03-26 NOTE — Patient Instructions (Signed)
It was our pleasure taking care of you in our clinic today.  You came in to establish care. I sent refill for your medications that you ran out since few months ago. Please pick them up from your pharmacy and take them as before.  Come back to clinic in 2 weeks for blood work and blood pressure check. (And continuing establishing care with [place]) Please call breast center to confirm your mammogram appointment. (That was not done before because of no insurance)  Thank you

## 2019-03-26 NOTE — Assessment & Plan Note (Signed)
She has not taken metformin in past 4 months due to lack of insurance.  -Sent refill for Metformin today -Hemoglobin A1c -BMP -Follow-up in clinic in 2 weeks

## 2019-03-26 NOTE — Assessment & Plan Note (Signed)
-  Sent refill for propranolol today

## 2019-03-27 ENCOUNTER — Other Ambulatory Visit: Payer: Self-pay | Admitting: Internal Medicine

## 2019-03-27 DIAGNOSIS — N63 Unspecified lump in unspecified breast: Secondary | ICD-10-CM

## 2019-03-27 LAB — CMP14 + ANION GAP
ALT: 16 IU/L (ref 0–32)
AST: 15 IU/L (ref 0–40)
Albumin/Globulin Ratio: 1.4 (ref 1.2–2.2)
Albumin: 4.2 g/dL (ref 3.8–4.8)
Alkaline Phosphatase: 103 IU/L (ref 39–117)
Anion Gap: 16 mmol/L (ref 10.0–18.0)
BUN/Creatinine Ratio: 13 (ref 9–23)
BUN: 9 mg/dL (ref 6–20)
Bilirubin Total: 0.3 mg/dL (ref 0.0–1.2)
CO2: 24 mmol/L (ref 20–29)
Calcium: 9.4 mg/dL (ref 8.7–10.2)
Chloride: 97 mmol/L (ref 96–106)
Creatinine, Ser: 0.68 mg/dL (ref 0.57–1.00)
GFR calc Af Amer: 128 mL/min/{1.73_m2} (ref >59)
GFR calc non Af Amer: 111 mL/min/{1.73_m2} (ref >59)
Globulin, Total: 2.9 g/dL (ref 1.5–4.5)
Glucose: 186 mg/dL — ABNORMAL HIGH (ref 65–99)
Potassium: 4.4 mmol/L (ref 3.5–5.2)
Sodium: 137 mmol/L (ref 134–144)
Total Protein: 7.1 g/dL (ref 6.0–8.5)

## 2019-03-27 NOTE — Progress Notes (Signed)
Internal Medicine Clinic Attending  Case discussed with Dr. Myrtie Hawk  at the time of the visit.  We reviewed the resident's history and exam and pertinent patient test results.  I agree with the assessment, diagnosis, and plan of care documented in the resident's note.  As a correction, chief complaint is diabetes.

## 2019-03-30 ENCOUNTER — Encounter: Payer: Self-pay | Admitting: Internal Medicine

## 2019-04-03 ENCOUNTER — Telehealth: Payer: Medicaid Other | Admitting: Physician Assistant

## 2019-04-03 DIAGNOSIS — K148 Other diseases of tongue: Secondary | ICD-10-CM

## 2019-04-03 NOTE — Progress Notes (Signed)
Based on what you shared with me, I feel your condition warrants further evaluation and I recommend that you be seen for a face to face office visit.  NOTE: If you entered your credit card information for this eVisit, you will not be charged. You may see a "hold" on your card for the $35 but that hold will drop off and you will not have a charge processed.  If you are having a true medical emergency please call 911.     The following sites will take your insurance:  . [facility]    [phone]                  Get Driving Directions  [address] [place], [place] [address] . 10 am to 8 pm Monday-Friday . 12 pm to 8 pm Saturday-Sunday   . [facility]  [phone]                  Get Driving Directions  [address], [address] [place], [place] [address] . 8 am to 8 pm Monday-Friday . 9 am to 6 pm Saturday . 11 am to 6 pm Sunday   . [facility] [place]  [phone]                  Get Driving Directions   [address] [place], [place] [address] . 8 am to 8 pm Monday-Friday . 8 am to 4 pm Saturday-Sunday    . [facility] [place]                    Get Driving Directions  [phone]  [address] [place], [place] [address]  . Monday-Friday, 12 PM to 6 PM    Your e-visit answers were reviewed by a board certified advanced clinical practitioner to complete your personal care plan.  Thank you for using e-Visits.   I have spent 5 minutes in review of e-visit questionnaire, review and updating patient chart, medical decision making and response to patient.    Milinda Sweeney, PA-C   

## 2019-04-06 ENCOUNTER — Other Ambulatory Visit: Payer: Self-pay | Admitting: Internal Medicine

## 2019-04-07 ENCOUNTER — Other Ambulatory Visit: Payer: Medicaid Other

## 2019-04-07 ENCOUNTER — Ambulatory Visit
Admission: RE | Admit: 2019-04-07 | Discharge: 2019-04-07 | Disposition: A | Discharge status: Home / Self Care | Payer: Medicaid Other | Source: Ambulatory Visit | Attending: *Deleted | Admitting: *Deleted

## 2019-04-07 ENCOUNTER — Other Ambulatory Visit: Payer: Self-pay

## 2019-04-07 DIAGNOSIS — N6325 Unspecified lump in the left breast, overlapping quadrants: Secondary | ICD-10-CM | POA: Diagnosis not present

## 2019-04-07 DIAGNOSIS — R928 Other abnormal and inconclusive findings on diagnostic imaging of breast: Secondary | ICD-10-CM | POA: Diagnosis not present

## 2019-04-07 DIAGNOSIS — N63 Unspecified lump in unspecified breast: Secondary | ICD-10-CM

## 2019-04-09 ENCOUNTER — Ambulatory Visit: Payer: Medicaid Other | Admitting: Internal Medicine

## 2019-04-09 ENCOUNTER — Other Ambulatory Visit: Payer: Self-pay

## 2019-04-09 ENCOUNTER — Encounter: Payer: Self-pay | Admitting: Internal Medicine

## 2019-04-09 ENCOUNTER — Other Ambulatory Visit (HOSPITAL_COMMUNITY)
Admission: RE | Admit: 2019-04-09 | Discharge: 2019-04-09 | Disposition: A | Discharge status: Home / Self Care | Payer: Medicaid Other | Source: Ambulatory Visit | Attending: Student in an Organized Health Care Education/Training Program | Admitting: Student in an Organized Health Care Education/Training Program

## 2019-04-09 VITALS — BP 146/108 | HR 103 | Temp 98.7°F | Ht 67.0 in | Wt 374.4 lb

## 2019-04-09 DIAGNOSIS — I10 Essential (primary) hypertension: Secondary | ICD-10-CM

## 2019-04-09 DIAGNOSIS — E119 Type 2 diabetes mellitus without complications: Secondary | ICD-10-CM | POA: Diagnosis not present

## 2019-04-09 DIAGNOSIS — N920 Excessive and frequent menstruation with regular cycle: Secondary | ICD-10-CM | POA: Diagnosis not present

## 2019-04-09 DIAGNOSIS — B373 Candidiasis of vulva and vagina: Secondary | ICD-10-CM | POA: Diagnosis not present

## 2019-04-09 DIAGNOSIS — R300 Dysuria: Secondary | ICD-10-CM | POA: Diagnosis not present

## 2019-04-09 DIAGNOSIS — N941 Unspecified dyspareunia: Secondary | ICD-10-CM | POA: Diagnosis not present

## 2019-04-09 DIAGNOSIS — Z7984 Long term (current) use of oral hypoglycemic drugs: Secondary | ICD-10-CM

## 2019-04-09 DIAGNOSIS — R5383 Other fatigue: Secondary | ICD-10-CM

## 2019-04-09 DIAGNOSIS — Z79899 Other long term (current) drug therapy: Secondary | ICD-10-CM | POA: Diagnosis not present

## 2019-04-09 LAB — POCT URINALYSIS DIPSTICK
Bilirubin, UA: NEGATIVE
Blood, UA: NEGATIVE
Glucose, UA: NEGATIVE
Ketones, UA: NEGATIVE
Leukocytes, UA: NEGATIVE
Nitrite, UA: NEGATIVE
Protein, UA: NEGATIVE
Spec Grav, UA: 1.02 (ref 1.010–1.025)
Urobilinogen, UA: 0.2 E.U./dL
pH, UA: 5.5 (ref 5.0–8.0)

## 2019-04-09 MED ORDER — AMLODIPINE BESYLATE 10 MG PO TABS: 10.0000 mg | ORAL_TABLET | Freq: Every day | ORAL | 0 refills | Status: DC

## 2019-04-09 MED ORDER — TRAMADOL HCL 50 MG PO TABS: 50.0000 mg | ORAL_TABLET | Freq: Four times a day (QID) | ORAL | 0 refills | Status: AC | PRN

## 2019-04-09 NOTE — Patient Instructions (Addendum)
Ms. Schillo, It was nice meeting you!  Today we discussed:  Your bladder pain - I am sending the vaginal swab and your urine for formal testing to be sure there is no signs of infection. I think you would benefit from evaluation by a urogynecologist. I have placed this referral. I have sent in a prescription for Tramadol to use as needed for severe pain. Continue using the heating pad and Ibuprofen as needed.   Your fatigue - I am checking your thyroid, blood counts and Vitamin D and will let you know these results.   Your blood pressure - we are starting a new medication (Amlodipine) and would like to see you back in another 2 weeks for re-check.    Your Diabetes - I am providing a sheet on how to slowly increase up to 1000 mg twice daily of the metformin. We will see how your A1C improves in a couple of months.   Take care! Dr. Koleen Distance

## 2019-04-09 NOTE — Progress Notes (Signed)
   CC: HTN, fatigue, bladder pain   HPI:  Maria Carpenter is a 38 y.o. female with PMHx listed below who presents for HTN follow-up, fatigue and acute onset dysuria and bladder pain. Please see problem based charting for further details.   Past Medical History:  Diagnosis Date  . Anemia   . Asthma   . Chlamydia 2012   treated  . Complication of anesthesia    " they have trouble waking me up"  . Diabetes mellitus    A2DM during pregnancy. now on metformin.   . Family history of anesthesia complication    mother & daughter   . GERD (gastroesophageal reflux disease)   . Gonorrhea 2010   treated  . Hypertension   . Lupus ([place]) 10-2013  . Morbid obesity ([place]) 07/22/2011  . Pregnancy induced hypertension    pre E with pregnancies  . Rheumatoid arthritis(714.0)   . Shortness of breath   . Sleep apnea    does not use cpap  . Stroke [facility]) 2012   March 2012>right side deficit (speech, upper/ower weakness)  . SVT (supraventricular tachycardia) (HCC)    Review of Systems:   Review of Systems  Constitutional: Positive for malaise/fatigue. Negative for chills, fever and weight loss.  Respiratory: Negative for cough and shortness of breath.   Cardiovascular: Negative for chest pain.  Gastrointestinal: Positive for abdominal pain. Negative for constipation and diarrhea.  Genitourinary: Positive for dysuria. Negative for hematuria.  Musculoskeletal: Positive for joint pain. Negative for falls.  Skin: Negative for rash.  Neurological: Negative for dizziness and focal weakness.   Physical Exam:  Vitals:   04/09/19 1006  BP: (!) 146/108  Pulse: (!) 103  Temp: 98.7 F (37.1 C)  TempSrc: Oral  SpO2: 99%  Weight: (!) 374 lb 6.4 oz (169.8 kg)  Height: 5\' 7"  (1.702 m)   Physical Exam Exam conducted with a chaperone present.  Constitutional:      General: She is not in acute distress.    Appearance: She is obese.  Cardiovascular:     Rate and Rhythm: Normal rate and  regular rhythm.  Pulmonary:     Effort: Pulmonary effort is normal.     Breath sounds: Normal breath sounds.  Abdominal:     General: Bowel sounds are normal.     Palpations: Abdomen is soft.     Tenderness: There is abdominal tenderness in the suprapubic area. There is no guarding or rebound.     Hernia: No hernia is present.  Genitourinary:    General: Normal vulva.     Pubic Area: No rash.      Labia:        Right: No tenderness or lesion.        Left: No tenderness or lesion.      Urethra: No urethral pain, urethral swelling or urethral lesion.     Vagina: Tenderness present. No bleeding or lesions.     Cervix: No cervical motion tenderness.     Uterus: Tender.      Adnexa: Right adnexa normal.       Left: Tenderness present.   Neurological:     Mental Status: She is alert.      Assessment & Plan:   See Encounters Tab for problem based charting.  Patient discussed with Dr. Rebeca Alert

## 2019-04-10 LAB — URINALYSIS, COMPLETE
Bilirubin, UA: NEGATIVE
Glucose, UA: NEGATIVE
Ketones, UA: NEGATIVE
Leukocytes,UA: NEGATIVE
Nitrite, UA: NEGATIVE
Protein,UA: NEGATIVE
RBC, UA: NEGATIVE
Specific Gravity, UA: 1.018 (ref 1.005–1.030)
Urobilinogen, Ur: 0.2 mg/dL (ref 0.2–1.0)
pH, UA: 5.5 (ref 5.0–7.5)

## 2019-04-10 LAB — CBC WITH DIFFERENTIAL/PLATELET
Basophils Absolute: 0 10*3/uL (ref 0.0–0.2)
Basos: 0 %
EOS (ABSOLUTE): 0.1 10*3/uL (ref 0.0–0.4)
Eos: 1 %
Hematocrit: 37.4 % (ref 34.0–46.6)
Hemoglobin: 11.9 g/dL (ref 11.1–15.9)
Immature Grans (Abs): 0 10*3/uL (ref 0.0–0.1)
Immature Granulocytes: 1 %
Lymphocytes Absolute: 2.7 10*3/uL (ref 0.7–3.1)
Lymphs: 35 %
MCH: 27.7 pg (ref 26.6–33.0)
MCHC: 31.8 g/dL (ref 31.5–35.7)
MCV: 87 fL (ref 79–97)
Monocytes Absolute: 0.5 10*3/uL (ref 0.1–0.9)
Monocytes: 6 %
Neutrophils Absolute: 4.5 10*3/uL (ref 1.4–7.0)
Neutrophils: 57 %
Platelets: 238 10*3/uL (ref 150–450)
RBC: 4.29 x10E6/uL (ref 3.77–5.28)
RDW: 14.4 % (ref 11.7–15.4)
WBC: 7.8 10*3/uL (ref 3.4–10.8)

## 2019-04-10 LAB — MICROSCOPIC EXAMINATION
Casts: NONE SEEN /lpf
RBC, Urine: NONE SEEN /hpf (ref 0–2)
WBC, UA: NONE SEEN /hpf (ref 0–5)

## 2019-04-10 LAB — TSH: TSH: 0.78 u[IU]/mL (ref 0.450–4.500)

## 2019-04-10 LAB — VITAMIN D 25 HYDROXY (VIT D DEFICIENCY, FRACTURES): Vit D, 25-Hydroxy: 16.9 ng/mL — ABNORMAL LOW (ref 30.0–100.0)

## 2019-04-11 ENCOUNTER — Encounter: Payer: Self-pay | Admitting: Internal Medicine

## 2019-04-11 DIAGNOSIS — R5383 Other fatigue: Secondary | ICD-10-CM | POA: Insufficient documentation

## 2019-04-11 DIAGNOSIS — R300 Dysuria: Secondary | ICD-10-CM | POA: Insufficient documentation

## 2019-04-11 HISTORY — DX: Dysuria: R30.0

## 2019-04-11 LAB — CERVICOVAGINAL ANCILLARY ONLY
Bacterial vaginitis: NEGATIVE
Candida vaginitis: POSITIVE — AB
Chlamydia: NEGATIVE
Neisseria Gonorrhea: NEGATIVE
Trichomonas: NEGATIVE

## 2019-04-11 MED ORDER — FLUCONAZOLE 150 MG PO TABS: 150.0000 mg | ORAL_TABLET | Freq: Every day | ORAL | 0 refills | Status: AC

## 2019-04-11 MED ORDER — VITAMIN D (CHOLECALCIFEROL) 25 MCG (1000 UT) PO TABS: 1000.0000 [IU] | ORAL_TABLET | Freq: Every day | ORAL | 2 refills | Status: AC

## 2019-04-11 NOTE — Assessment & Plan Note (Signed)
Patient's main concern today is 5 day history of dysuria. Additionally she developed episode of severe lower abdominal pain that radiated into her back. She tried Ibuprofen and heating pad with minimal relief. Patient states she has been diagnosed with interstitial cystitis and was told to take Ibuprofen.  - U/A unremarkable - PVR done in office today which did not show evidence of retention - pelvic exam significant for tenderness during internal exam and palpating over uterus - her vaginal swab came back positive for candidiasis; will treat with diflucan  - given her acute on chronic urinary symptoms, as well as longstanding history of dyspareunia will place referral to urogynecologist today for further evaluation and management.

## 2019-04-11 NOTE — Assessment & Plan Note (Signed)
Patient endorses several week history of generalized fatigue. Endorses intermittent heavy menstrual bleeding.  TSH, CBC, and Vit D checked today. She is vitamin D deficient which she reports history of in the past. Will initiate supplementation and have PCP re-check in 3 months.

## 2019-04-11 NOTE — Assessment & Plan Note (Addendum)
Patient presented for 2 week follow-up on HTN after resuming HCTZ 25 mg daily and Lisinopril 10 mg. She states her blood pressure are still uncontrolled at home, sometimes as high as 200s/100s. She is asymptomatic.  Will add Amlodipine 10 mg and follow-up in another 2 weeks.  Patient will likely end up needing work-up for secondary cause once she fails optimum therapy.

## 2019-04-13 NOTE — Progress Notes (Signed)
Internal Medicine Clinic Attending  Case discussed with Dr. Bloomfield at the time of the visit.  We reviewed the resident's history and exam and pertinent patient test results.  I agree with the assessment, diagnosis, and plan of care documented in the resident's note.  Alexander Raines, M.D., Ph.D.  

## 2019-04-23 ENCOUNTER — Other Ambulatory Visit: Payer: Self-pay

## 2019-04-23 ENCOUNTER — Encounter: Payer: Self-pay | Admitting: Internal Medicine

## 2019-04-23 ENCOUNTER — Ambulatory Visit: Payer: Medicaid Other

## 2019-04-23 ENCOUNTER — Ambulatory Visit: Payer: Medicaid Other | Admitting: Internal Medicine

## 2019-04-23 VITALS — BP 165/89 | HR 102 | Temp 98.6°F | Wt 367.8 lb

## 2019-04-23 DIAGNOSIS — G43909 Migraine, unspecified, not intractable, without status migrainosus: Secondary | ICD-10-CM | POA: Diagnosis not present

## 2019-04-23 DIAGNOSIS — L732 Hidradenitis suppurativa: Secondary | ICD-10-CM | POA: Insufficient documentation

## 2019-04-23 DIAGNOSIS — I471 Supraventricular tachycardia: Secondary | ICD-10-CM

## 2019-04-23 DIAGNOSIS — Z79899 Other long term (current) drug therapy: Secondary | ICD-10-CM

## 2019-04-23 DIAGNOSIS — I10 Essential (primary) hypertension: Secondary | ICD-10-CM

## 2019-04-23 MED ORDER — LISINOPRIL 10 MG PO TABS: 20.0000 mg | ORAL_TABLET | Freq: Every day | ORAL | 2 refills | Status: DC

## 2019-04-23 MED ORDER — PROPRANOLOL HCL 20 MG PO TABS: 40.0000 mg | ORAL_TABLET | Freq: Two times a day (BID) | ORAL | 3 refills | Status: DC

## 2019-04-23 MED ORDER — CLINDAMYCIN PHOSPHATE 1 % EX GEL: Freq: Two times a day (BID) | CUTANEOUS | 0 refills | Status: DC

## 2019-04-23 NOTE — Assessment & Plan Note (Signed)
Hypertension-uncontrolled: Ms. Mcroberts has had uncontrolled hypertension on multiple antihypertensives.  She was recently evaluated at the clinic by Dr. Koleen Distance several weeks ago for which amlodipine was added to her antihypertensives.  She endorses compliance to all her medicines.  She monitors her blood pressure at home and states that usually in the morning is in the 200s/100s.  Over the past several weeks she reports that she has experienced floaters with no specific timing as well as dizziness but denies syncope or presyncope.  She has a history of migraines since 38 years old however reports that when her blood pressure is elevated, she begins to experience bifrontal headaches.  She also reports that sometimes she will feel a throbbing pain just above her right eyebrow.    Initial blood pressure today was 172/23 with a pulse of 104, repeat blood pressure was 165/89 with a pulse of 102  BP Readings from Last 3 Encounters:  04/23/19 (!) 165/89  04/09/19 (!) 146/108  03/26/19 (!) 188/107    Assessment: Given her longstanding history, I would maximize her current antihypertensives and if there is no improvement to her blood pressure, will start work-up for secondary causes of hypertension such as pheochromocytoma, primary hyper aldosteronism, renal artery stenosis.  Plan: -Increase propranolol to 40 mg twice daily -Increase lisinopril to 20 mg daily -Continue hydrochlorothiazide 25 mg daily -Continue amlodipine 10 mg daily - Advised to BRING ALL Pill bottles at next visit

## 2019-04-23 NOTE — Patient Instructions (Signed)
Ms. nader,  It was a pleasure taking care of you in the clinic today.  Here my recommendations after our visit today.  1.  I want to increase your propranolol to 40 mg twice a day 2.  Increase your to 20 mg once a day.  I want to see you back at the clinic in 2 weeks with all your medications so we go through them.  Take care! Dr. Eileen Stanford  Please call the internal medicine center clinic if you have any questions or concerns, we may be able to help and keep you from a long and expensive emergency room wait. Our clinic and after hours phone number is [phone], the best time to call is Monday through Friday 9 am to 4 pm but there is always someone available 24/7 if you have an emergency. If you need medication refills please notify your pharmacy one week in advance and they will send [place] a request.

## 2019-04-23 NOTE — Assessment & Plan Note (Addendum)
Hidradenitis suppurativa: She reports of a history of "cysts "under her left breast that was sometimes burst with pustular drainage.  She reports of being told that she has hidradenitis suppurativa in the past and has taken oral antibiotics for this.  On assessment, she has several lesions under her left breast that looks erythematous and tender however no evidence of pustular drainage or pocket of abscess.  Mastitis is less likely and does not appear to be an intertrigo candidal infection.    Plan: -Trial of topical clindamycin

## 2019-04-23 NOTE — Progress Notes (Signed)
   CC: Maria Carpenter  HPI:  [name] is a 38 y.o. with medical history of Maria Carpenter presenting for follow-up.  Please see problem based charting for further details.  Past Medical History:  Diagnosis Date  . Maria Carpenter   . Maria Carpenter   . Maria Carpenter 2012   treated  . Maria Carpenter    " they have trouble waking me up"  . Maria Carpenter    A2DM during Maria. now on metformin.   . Maria Carpenter    mother & daughter   . Maria Carpenter (gastroesophageal reflux disease)   . Maria Carpenter 2010   treated  . Maria Carpenter   . Maria Carpenter ([place]) 10-2013  . Maria Carpenter ([place]) 07/22/2011  . Maria Carpenter    pre E with pregnancies  . Maria Carpenter(714.0)   . Maria Carpenter   . Maria Carpenter    does not use cpap  . Maria Carpenter [facility]) 2012   March 2012>right side deficit (speech, upper/ower weakness)  . Maria Carpenter (supraventricular tachycardia) ([place])    Review of Systems: As per HPI  Physical Exam:  Vitals:   04/23/19 1341 04/23/19 1448  BP: (!) 172/123 (!) 165/89  Pulse: (!) 104 (!) 102  Temp: 98.6 F (37 C)   TempSrc: Oral   SpO2: 100%   Weight: (!) 367 lb 12.8 oz (166.8 kg)    Physical Exam  Constitutional: She is well-developed, well-nourished, and in no distress. No distress.  HENT:  Head: Normocephalic and atraumatic.  Eyes: Conjunctivae are normal.  Neck: Neck supple.  Cardiovascular: Normal rate, regular rhythm and normal heart sounds.  Pulmonary/Chest: Effort normal and Carpenter sounds normal. She has no wheezes. She has no rales.  Musculoskeletal: Normal range of motion.        General: No edema.  Skin: She is not diaphoretic.      Assessment & Plan:   See Encounters Tab for problem based charting.  Patient discussed with Dr. Philipp Ovens

## 2019-04-24 NOTE — Progress Notes (Signed)
Internal Medicine Clinic Attending  Case discussed with Dr. Agyei at the time of the visit.  We reviewed the resident's history and exam and pertinent patient test results.  I agree with the assessment, diagnosis, and plan of care documented in the resident's note.    

## 2019-04-27 ENCOUNTER — Telehealth: Payer: Self-pay | Admitting: *Deleted

## 2019-04-27 NOTE — Telephone Encounter (Signed)
Call to [place] for PA for Clindamycin Phosphate 1% Gel.  Not on formulary for [facility].  Does cover Clindamycin-Benzoyl -Perioxide Gel.  Clindamycin-Benzol Peroxide Gel Generic for [place]. Clindamycin-Benzol peroxide with pump Generic for Benzaclin. Differin Gel, Differin Cream and Gel pump, Rentin A Gel, Erythromycin solution-generic for Emcin.  Message to be sent to Dr. Eileen Stanford.  Sander Nephew, RN 04/27/2019 4:05 AM.

## 2019-04-28 ENCOUNTER — Other Ambulatory Visit: Payer: Self-pay | Admitting: Internal Medicine

## 2019-04-28 ENCOUNTER — Encounter: Payer: Self-pay | Admitting: Internal Medicine

## 2019-04-28 DIAGNOSIS — L732 Hidradenitis suppurativa: Secondary | ICD-10-CM

## 2019-04-28 MED ORDER — CLINDAMYCIN PHOS-BENZOYL PEROX 1-5 % EX GEL: Freq: Two times a day (BID) | CUTANEOUS | 0 refills | Status: DC

## 2019-04-30 ENCOUNTER — Ambulatory Visit: Payer: Medicaid Other

## 2019-05-06 ENCOUNTER — Telehealth: Payer: Self-pay | Admitting: Internal Medicine

## 2019-05-06 ENCOUNTER — Other Ambulatory Visit: Payer: Self-pay | Admitting: Internal Medicine

## 2019-05-06 NOTE — Telephone Encounter (Signed)
:  Pt is calling checking on her medicine, pharmacy said they was waiting on a prior authorization its been over a week, pt is getting worse she is wanting a nurse to callback to see what she can do for her condition [phone]

## 2019-05-06 NOTE — Telephone Encounter (Signed)
Hi,  The first medication I wrote was not approved by her insurance so I wrote a prescription for a generic medicine last week. Not really sure about this insurance authorization confusion.  Thanks

## 2019-05-06 NOTE — Telephone Encounter (Signed)
Hi Helen,   I picked the prescription from the list Regino Schultze provided.

## 2019-05-06 NOTE — Telephone Encounter (Signed)
Maria Carpenter listed the meds insurance stated they would pay for, could you pick one of those and send a new script for it?

## 2019-05-07 ENCOUNTER — Ambulatory Visit (INDEPENDENT_AMBULATORY_CARE_PROVIDER_SITE_OTHER): Payer: Medicaid Other | Admitting: Internal Medicine

## 2019-05-07 ENCOUNTER — Encounter: Payer: Self-pay | Admitting: Internal Medicine

## 2019-05-07 ENCOUNTER — Other Ambulatory Visit: Payer: Self-pay

## 2019-05-07 VITALS — BP 154/104 | HR 85 | Temp 98.4°F | Ht 67.0 in | Wt 365.6 lb

## 2019-05-07 DIAGNOSIS — I10 Essential (primary) hypertension: Secondary | ICD-10-CM | POA: Diagnosis not present

## 2019-05-07 DIAGNOSIS — Z720 Tobacco use: Secondary | ICD-10-CM

## 2019-05-07 DIAGNOSIS — L732 Hidradenitis suppurativa: Secondary | ICD-10-CM

## 2019-05-07 DIAGNOSIS — N926 Irregular menstruation, unspecified: Secondary | ICD-10-CM

## 2019-05-07 DIAGNOSIS — Z79899 Other long term (current) drug therapy: Secondary | ICD-10-CM | POA: Diagnosis not present

## 2019-05-07 DIAGNOSIS — R0600 Dyspnea, unspecified: Secondary | ICD-10-CM | POA: Diagnosis not present

## 2019-05-07 DIAGNOSIS — Z6841 Body Mass Index (BMI) 40.0 and over, adult: Secondary | ICD-10-CM

## 2019-05-07 DIAGNOSIS — G4733 Obstructive sleep apnea (adult) (pediatric): Secondary | ICD-10-CM

## 2019-05-07 DIAGNOSIS — J45909 Unspecified asthma, uncomplicated: Secondary | ICD-10-CM | POA: Diagnosis not present

## 2019-05-07 DIAGNOSIS — Z23 Encounter for immunization: Secondary | ICD-10-CM

## 2019-05-07 HISTORY — DX: Dyspnea, unspecified: R06.00

## 2019-05-07 MED ORDER — VARENICLINE TARTRATE 1 MG PO TABS: 1.0000 mg | ORAL_TABLET | Freq: Two times a day (BID) | ORAL | 0 refills | Status: AC

## 2019-05-07 MED ORDER — SPIRONOLACTONE 25 MG PO TABS: 25.0000 mg | ORAL_TABLET | Freq: Every day | ORAL | 0 refills | Status: DC

## 2019-05-07 MED ORDER — CHANTIX STARTING MONTH PAK 0.5 MG X 11 & 1 MG X 42 PO TABS: ORAL_TABLET | ORAL | 0 refills | Status: DC

## 2019-05-07 MED ORDER — DOXYCYCLINE HYCLATE 100 MG PO TABS: 100.0000 mg | ORAL_TABLET | Freq: Two times a day (BID) | ORAL | 0 refills | Status: DC

## 2019-05-07 NOTE — Progress Notes (Signed)
Internal Medicine Clinic Attending  Case discussed with Dr. Chundi at the time of the visit.  We reviewed the resident's history and exam and pertinent patient test results.  I agree with the assessment, diagnosis, and plan of care documented in the resident's note. 

## 2019-05-07 NOTE — Assessment & Plan Note (Signed)
The patient has a blood pressure reading of 157/104 during today's visit. She is currently taking propranolol 40mg  bid, lisinopril 20mg  qd, hctz 25mg  qd, amlodipine 10mg  qd. She states that she is adherent to the medication. She has lost 5lbs in the past 2 months.  Assessment and plan  Patient has resistant hypertension while being on 4 different classes of antihypertensive mediations. The patient has several other causes for secondary hypertension which include morbid obesity, tobacco use disorder and untreated OSA.   -started spironolactone 25mg  qd  -continue current antihypertensive medications  -counseled on lifestyle modifications  -Referral to nutrition -Split night study for cpap  -started on chantix for smoking cessation  -follow up in 1 week

## 2019-05-07 NOTE — Assessment & Plan Note (Signed)
Patient presents to be evaluated for cyst under left breast that has been present for the past 3 weeks. Patient describes as a erythematous circular knot, that drains pustular material, and is extremely painful.   Patient states that she has been having these "cysts" since age 38 yrs when she was first diagnosed by  Physician in [place]. She states that usually they present on breasts, inner thighs, under armpits, vaginal area, buttocks. She is usually treated with oral antibiotics. In 2012 one of the cysts had to be sliced open.   Patient has been evaluated for this in the past and diagnosed with hidradenitis suppurativa. She was last seen on 8/27 and given a trial of topical clindamycin.   Assessment and plan Patient's lesions appear consistent with hidradenitis suppurativa. She has had this since puberty. The nodules/abscesses have been present in intertrigenous areas (arm pits, under abdominal fat pad, under bilateral breast, groin).   -prescribed doxycycline 100mg  bid for moderate to severe HS -started spironolactone 25mg  qd as it has benefit for both htn and Hs -Referral to [facility] HS clinic  -Follow up in 1 week

## 2019-05-07 NOTE — Progress Notes (Signed)
   CC: Cyst under breasts   HPI:  [name] is a 38 y.o. female with hypertension, hidradenitis suppurativa, abnormal uterine bleeding, osa, asthma who presented for evaluation of right breast abscess/nodule. Please see problem based charting for evaluation, assessment, and plan.  Past Medical History:  Diagnosis Date  . Anemia   . Asthma   . Chlamydia 2012   treated  . Complication of anesthesia    " they have trouble waking me up"  . Diabetes mellitus    A2DM during pregnancy. now on metformin.   . Family history of anesthesia complication    mother & daughter   . GERD (gastroesophageal reflux disease)   . Gonorrhea 2010   treated  . Hypertension   . Lupus ([place]) 10-2013  . Morbid obesity ([place]) 07/22/2011  . Pregnancy induced hypertension    pre E with pregnancies  . Rheumatoid arthritis(714.0)   . Shortness of breath   . Sleep apnea    does not use cpap  . Stroke [facility]) 2012   March 2012>right side deficit (speech, upper/ower weakness)  . SVT (supraventricular tachycardia) (HCC)    Review of Systems:    Review of Systems  Constitutional: Positive for malaise/fatigue. Negative for chills and fever.  Respiratory: Positive for cough and shortness of breath.   Cardiovascular: Negative for chest pain.  Gastrointestinal: Negative for abdominal pain, nausea and vomiting.  Neurological: Negative for dizziness and headaches.   Physical Exam:  Vitals:   05/07/19 0928  BP: (!) 154/104  Pulse: 85  Temp: 98.4 F (36.9 C)  TempSrc: Oral  SpO2: 100%  Weight: (!) 365 lb 9.6 oz (165.8 kg)  Height: 5\' 7"  (1.702 m)   Physical Exam  Constitutional: She is oriented to person, place, and time. She appears well-developed and well-nourished. No distress.  HENT:  Head: Normocephalic and atraumatic.  Eyes: Conjunctivae are normal.  Cardiovascular: Normal rate, regular rhythm and normal heart sounds.  Respiratory: Effort normal and breath sounds normal. No respiratory  distress. She has no wheezes.  GI: Soft. Bowel sounds are normal. She exhibits no distension. There is no abdominal tenderness.  Musculoskeletal:        General: No edema.  Neurological: She is alert and oriented to person, place, and time.  Skin: She is not diaphoretic.  3-4cm area of raised, fluctuant, tender,erythematous abscess that is present on right breast.   Several areas of scar tissue post abscess drainage from Hidradenitis suppurativa in bilateral axilla, groin, and other intertriginous regions  Psychiatric: She has a normal mood and affect. Her behavior is normal. Judgment and thought content normal.         Assessment & Plan:   See Encounters Tab for problem based charting.  Patient discussed with Dr. Dareen Piano

## 2019-05-07 NOTE — Patient Instructions (Addendum)
It was a pleasure to see you today Maria Carpenter. Please make the following changes:  For Hidradenitis Suppurativa:  -please start taking doxycycline 100mg  bid  -please follow up with the hidradenitis clinic at [facility] -please call [facility] if you experience worsening pain  -start taking spironolactone 25mg  daily  For your elevated blood pressure:  -continue using all your current blood pressure medication  -start taking spironolactone 25mg  daily -exercise regularly  -work on eating healthy diet  -you have been referred to a nutritionist  -please get sleep study done to get cpap machine   For shortness of breath and swelling in lower extremity:  -please get echo done  If you have any questions or concerns, please call our clinic at [phone] between 9am-5pm and after hours call [phone] and ask for the internal medicine resident on call. If you feel you are having a medical emergency please call 911.   Thank you, we look forward to help you remain healthy!  , MD Internal Medicine PGY3    Hidradenitis Suppurativa Hidradenitis suppurativa is a long-term (chronic) skin disease. It is similar to a severe form of acne, but it affects areas of the body where acne would be unusual, especially areas of the body where skin rubs against skin and becomes moist. These include:  Underarms.  Groin.  Genital area.  Buttocks.  Upper thighs.  Breasts. Hidradenitis suppurativa may start out as small lumps or pimples caused by blocked sweat glands or hair follicles. Pimples may develop into deep sores that break open (rupture) and drain pus. Over time, affected areas of skin may thicken and become scarred. This condition is rare and does not spread from person to person (non-contagious). What are the causes? The exact cause of this condition is not known. It may be related to:  Female and female hormones.  An overactive disease-fighting system (immune system). The immune system may  over-react to blocked hair follicles or sweat glands and cause swelling and pus-filled sores. What increases the risk? You are more likely to develop this condition if you:  Are female.  Are 75-44 years old.  Have a family history of hidradenitis suppurativa.  Have a personal history of acne.  Are overweight.  Smoke.  Take the medicine lithium. What are the signs or symptoms? The first symptoms are usually painful bumps in the skin, similar to pimples. The condition may get worse over time (progress), or it may only cause mild symptoms. If the disease progresses, symptoms may include:  Skin bumps getting bigger and growing deeper into the skin.  Bumps rupturing and draining pus.  Itchy, infected skin.  Skin getting thicker and scarred.  Tunnels under the skin (fistulas) where pus drains from a bump.  Pain during daily activities, such as pain during walking if your groin area is affected.  Emotional problems, such as stress or depression. This condition may affect your appearance and your ability or willingness to wear certain clothes or do certain activities. How is this diagnosed? This condition is diagnosed by a health care provider who specializes in skin diseases (dermatologist). You may be diagnosed based on:  Your symptoms and medical history.  A physical exam.  Testing a pus sample for infection.  Blood tests. How is this treated? Your treatment will depend on how severe your symptoms are. The same treatment will not work for everybody with this condition. You may need to try several treatments to find what works best for you. Treatment may include:  Cleaning  and bandaging (dressing) your wounds as needed.  Lifestyle changes, such as new skin care routines.  Taking medicines, such as: ? Antibiotics. ? Acne medicines. ? Medicines to reduce the activity of the immune system. ? A diabetes medicine (metformin). ? Birth control pills, for women. ? Steroids  to reduce swelling and pain.  Working with a mental health care provider, if you experience emotional distress due to this condition. If you have severe symptoms that do not get better with medicine, you may need surgery. Surgery may involve:  Using a laser to clear the skin and remove hair follicles.  Opening and draining deep sores.  Removing the areas of skin that are diseased and scarred. Follow these instructions at home: Medicines   Take over-the-counter and prescription medicines only as told by your health care provider.  If you were prescribed an antibiotic medicine, take it as told by your health care provider. Do not stop taking the antibiotic even if your condition improves. Skin care  If you have open wounds, cover them with a clean dressing as told by your health care provider. Keep wounds clean by washing them gently with soap and water when you bathe.  Do not shave the areas where you get hidradenitis suppurativa.  Do not wear deodorant.  Wear loose-fitting clothes.  Try to avoid getting overheated or sweaty. If you get sweaty or wet, change into clean, dry clothes as soon as you can.  To help relieve pain and itchiness, cover sore areas with a warm, clean washcloth (warm compress) for 5-10 minutes as often as needed.  If told by your health care provider, take a bleach bath twice a week: ? Fill your bathtub halfway with water. ? Pour in  cup of unscented household bleach. ? Soak in the tub for 5-10 minutes. ? Only soak from the neck down. Avoid water on your face and hair. ? Shower to rinse off the bleach from your skin. General instructions  Learn as much as you can about your disease so that you have an active role in your treatment. Work closely with your health care provider to find treatments that work for you.  If you are overweight, work with your health care provider to lose weight as recommended.  Do not use any products that contain nicotine or  tobacco, such as cigarettes and e-cigarettes. If you need help quitting, ask your health care provider.  If you struggle with living with this condition, talk with your health care provider or work with a mental health care provider as recommended.  Keep all follow-up visits as told by your health care provider. This is important. Where to find more information  Hidradenitis [place].: https://www.hs-foundation.org/ Contact a health care provider if you have:  A flare-up of hidradenitis suppurativa.  A fever or chills.  Trouble controlling your symptoms at home.  Trouble doing your daily activities because of your symptoms.  Trouble dealing with emotional problems related to your condition. Summary  Hidradenitis suppurativa is a long-term (chronic) skin disease. It is similar to a severe form of acne, but it affects areas of the body where acne would be unusual.  The first symptoms are usually painful bumps in the skin, similar to pimples. The condition may get worse over time (progress), or it may only cause mild symptoms.  If you have open wounds, cover them with a clean dressing as told by your health care provider. Keep wounds clean by washing them gently with soap and  water when you bathe.  Besides skin care, treatment may include medicines, laser treatment, and surgery. This information is not intended to replace advice given to you by your health care provider. Make sure you discuss any questions you have with your health care provider. Document Released: 03/27/2004 Document Revised: 08/21/2017 Document Reviewed: 08/21/2017 Elsevier Patient Education  2020 [employer].

## 2019-05-07 NOTE — Assessment & Plan Note (Signed)
Patient states that she has frequent rapid change in her weight with accompanied orthopnea, pnd, dyspnea, and peripheral edema.   -Baseline TTE

## 2019-05-14 ENCOUNTER — Ambulatory Visit (INDEPENDENT_AMBULATORY_CARE_PROVIDER_SITE_OTHER): Payer: Medicaid Other | Admitting: Dietician

## 2019-05-14 ENCOUNTER — Other Ambulatory Visit: Payer: Self-pay

## 2019-05-14 ENCOUNTER — Ambulatory Visit: Payer: Medicaid Other | Admitting: Internal Medicine

## 2019-05-14 ENCOUNTER — Encounter: Payer: Self-pay | Admitting: Dietician

## 2019-05-14 VITALS — BP 158/98 | HR 94 | Temp 98.8°F | Ht 67.0 in | Wt 363.9 lb

## 2019-05-14 DIAGNOSIS — E119 Type 2 diabetes mellitus without complications: Secondary | ICD-10-CM

## 2019-05-14 DIAGNOSIS — J45909 Unspecified asthma, uncomplicated: Secondary | ICD-10-CM | POA: Diagnosis not present

## 2019-05-14 DIAGNOSIS — L732 Hidradenitis suppurativa: Secondary | ICD-10-CM

## 2019-05-14 DIAGNOSIS — J4540 Moderate persistent asthma, uncomplicated: Secondary | ICD-10-CM

## 2019-05-14 DIAGNOSIS — I10 Essential (primary) hypertension: Secondary | ICD-10-CM

## 2019-05-14 DIAGNOSIS — Z6841 Body Mass Index (BMI) 40.0 and over, adult: Secondary | ICD-10-CM | POA: Diagnosis not present

## 2019-05-14 DIAGNOSIS — Z792 Long term (current) use of antibiotics: Secondary | ICD-10-CM | POA: Diagnosis not present

## 2019-05-14 DIAGNOSIS — Z713 Dietary counseling and surveillance: Secondary | ICD-10-CM | POA: Diagnosis not present

## 2019-05-14 DIAGNOSIS — Z79899 Other long term (current) drug therapy: Secondary | ICD-10-CM | POA: Diagnosis not present

## 2019-05-14 DIAGNOSIS — Z720 Tobacco use: Secondary | ICD-10-CM

## 2019-05-14 MED ORDER — BUDESONIDE 180 MCG/ACT IN AEPB: 1.0000 | INHALATION_SPRAY | Freq: Two times a day (BID) | RESPIRATORY_TRACT | 1 refills | Status: DC

## 2019-05-14 MED ORDER — FLUCONAZOLE 150 MG PO TABS: 150.0000 mg | ORAL_TABLET | Freq: Every day | ORAL | 0 refills | Status: AC

## 2019-05-14 MED ORDER — LISINOPRIL-HYDROCHLOROTHIAZIDE 20-25 MG PO TABS: 1.0000 | ORAL_TABLET | Freq: Every day | ORAL | 0 refills | Status: DC

## 2019-05-14 NOTE — Patient Instructions (Signed)
Hi Maria Carpenter.   It was good to meet you today!  I suggest we follow up in a month to reinforce what we discussed today.   I would also like to discuss with you  some other general things about diabetes.   Take care!   Butch Penny [phone]

## 2019-05-14 NOTE — Assessment & Plan Note (Signed)
The patient states that she feels the pain and the size of her HS lesions have decreased since she has been on doxycycline.   Assessment and plan  -will continue doxycycline 100mg  bid  -[facility]

## 2019-05-14 NOTE — Assessment & Plan Note (Signed)
  Patient with blood pressure not at goal today. She has a blood pressure of 158/98. She is supposed to be takin spironolactone 25mg  qd, lisinopril 20mg  qd, hctz 25mg  qd, amlodipine 10mg  qd.   The patient stated that she could not tolerate taking spironolactone as it made her very dizzy and she stopped taking it few days after it was started at last visit.   -to increase medication adherence switched the patient's medication to lisinopril-hctz 20-25mg  qd. Follow up in 2 weeks  -stopped spironolactone

## 2019-05-14 NOTE — Progress Notes (Signed)
   CC: Hidradenitis follow up  HPI:  Maria Carpenter is a 38 y.o. hypertension, diabetes mellitus, hidradenitis suppurativa, morbid obesity, tobacco use disorder who presents for follow up of the above issues. Please see problem based charting for evaluation, assessment, and plan.  Past Medical History:  Diagnosis Date  . Anemia   . Asthma   . Chlamydia 2012   treated  . Complication of anesthesia    " they have trouble waking me up"  . Diabetes mellitus    A2DM during pregnancy. now on metformin.   . Family history of anesthesia complication    mother & daughter   . GERD (gastroesophageal reflux disease)   . Gonorrhea 2010   treated  . Hypertension   . Lupus ([place]) 10-2013  . Morbid obesity ([place]) 07/22/2011  . Pregnancy induced hypertension    pre E with pregnancies  . Rheumatoid arthritis(714.0)   . Shortness of breath   . Sleep apnea    does not use cpap  . Stroke [facility]) 2012   March 2012>right side deficit (speech, upper/ower weakness)  . SVT (supraventricular tachycardia) ([facility])    Review of Systems:    Review of Systems  Constitutional: Negative for fever.  Respiratory: Positive for cough and shortness of breath.   Cardiovascular: Negative for chest pain.  Gastrointestinal: Negative for abdominal pain, nausea and vomiting.  Neurological: Negative for dizziness and headaches.   Physical Exam:  Vitals:   05/14/19 1442  BP: (!) 158/98  Pulse: 94  Temp: 98.8 F (37.1 C)  TempSrc: Oral  SpO2: 100%  Weight: (!) 363 lb 14.4 oz (165.1 kg)  Height: 5\' 7"  (1.702 m)   Physical Exam  Constitutional: She is oriented to person, place, and time. She appears well-developed and well-nourished. No distress.  HENT:  Head: Normocephalic and atraumatic.  Cardiovascular: Normal rate, regular rhythm and normal heart sounds.  Respiratory: Effort normal and breath sounds normal. No respiratory distress. She has no wheezes.  GI: Soft. Bowel sounds are normal. She  exhibits no distension. There is no abdominal tenderness.  Musculoskeletal:        General: No edema.  Neurological: She is alert and oriented to person, place, and time.  Skin: She is not diaphoretic.  Patient with 3-4cm in size raised, erythematous, firm nodules with 2-3 tunneled areas where there is drainage       Assessment & Plan:   See Encounters Tab for problem based charting.  Patient discussed with Dr. Evette Doffing

## 2019-05-14 NOTE — Progress Notes (Signed)
Diabetes Self-Management Education  Visit Type: First/Initial  Appt. Start Time: 1525 Appt. End Time: 6644  05/14/2019  [name], identified by name and date of birth, is a 38 y.o. female with a diagnosis of Diabetes: Type 2.   ASSESSMENT  Estimated body mass index is 56.99 kg/m as calculated from the following:   Height as of an earlier encounter on 05/14/19: 5\' 7"  (1.702 m).   Weight as of an earlier encounter on 05/14/19: 363 lb 14.4 oz (165.1 kg).  Wt Readings from Last 5 Encounters:  05/14/19 (!) 363 lb 14.4 oz (165.1 kg)  05/07/19 (!) 365 lb 9.6 oz (165.8 kg)  04/23/19 (!) 367 lb 12.8 oz (166.8 kg)  04/09/19 (!) 374 lb 6.4 oz (169.8 kg)  03/26/19 (!) 370 lb 12.8 oz (168.2 kg)    Lab Results  Component Value Date   HGBA1C 8.7 (A) 03/26/2019   HGBA1C 7.8 (H) 08/30/2018   HGBA1C 6.6 08/13/2017   HGBA1C 5.80 06/23/2015   HGBA1C 5.7 (H) 07/02/2012       Diabetes Self-Management Education - 05/14/19 1600      Visit Information   Visit Type  First/Initial      Initial Visit   Diabetes Type  Type 2    Are you currently following a meal plan?  Yes    What type of meal plan do you follow?  low calorie    Are you taking your medications as prescribed?  Yes      Health Coping   How would you rate your overall health?  Good      Psychosocial Assessment   Patient Belief/Attitude about Diabetes  Motivated to manage diabetes    Self-care barriers  Lack of material resources    Self-management support  Doctor's office;Family;CDE visits    Patient Concerns  Nutrition/Meal planning    Special Needs  None    Preferred Learning Style  No preference indicated    Learning Readiness  Change in progress    How often do you need to have someone help you when you read instructions, pamphlets, or other written materials from your doctor or pharmacy?  2 - Rarely      Pre-Education Assessment   Patient understands the diabetes disease and treatment process.  Needs  Instruction    Patient understands incorporating nutritional management into lifestyle.  Needs Instruction    Patient undertands incorporating physical activity into lifestyle.  Needs Instruction    Patient understands using medications safely.  Demonstrates understanding / competency    Patient understands prevention, detection, and treatment of acute complications.  Demonstrates understanding / competency    Patient understands how to develop strategies to promote health/change behavior.  Needs Instruction      Complications   Last HgB A1C per patient/outside source  8.7 %    Number of hypoglycemic episodes per month  --   says she gets symptoms when she does not eat enough   Number of hyperglycemic episodes per week  --   gets symptoms when she eats too much     Dietary Intake   Breakfast  soy or almond milk    Lunch  veggies, chicken      does not tolerate beef, pork, milk, cheese,dairy   Snack (afternoon)  fruit    [employer])  water      Patient Education   Disease state   Factors that contribute to the development of diabetes  Nutrition management   Role of diet in the treatment of diabetes and the relationship between the three main macronutrients and blood glucose level;Food label reading, portion sizes and measuring food.;Carbohydrate counting;Effects of alcohol on blood glucose and safety factors with consumption of alcohol.    Personal strategies to promote health  Helped patient develop diabetes management plan for (enter comment)   continued weight loss along with blood sugar control     Individualized Goals (developed by patient)   Nutrition  Follow meal plan discussed   spread carbs throughout the day     Outcomes   Expected Outcomes  Demonstrated interest in learning. Expect positive outcomes    Future DMSE  4-6 wks    Program Status  Not Completed       Individualized Plan for Diabetes Self-Management Training:   Learning Objective:   Patient will have a greater understanding of diabetes self-management. Patient education plan is to attend individual and/or group sessions per assessed needs and concerns.   Plan:   There are no Patient Instructions on file for this visit.  Expected Outcomes:  Demonstrated interest in learning. Expect positive outcomes  Education material provided: Carbohydrate counting sheet  If problems or questions, patient to contact team via:  Phone  Future DSME appointment: 4-6 wks Norm Parcel, RD 05/14/2019 5:15 PM.

## 2019-05-14 NOTE — Patient Instructions (Addendum)
It was a pleasure to see you today Ms. Fong. Please make the following changes:  For your hypertension:  Please start taking lisinopril-hctz 20-25mg  daily Continue taking amlodipine 10mg  daily Stop taking spironolactone   For your hidradenitis suppurative  continue taking doxycycline  [facility]  For the vaginal discharge take fluconazole 150mg  once For asthma  Start taking inhaled glucocorticoid   For smoking  Continue taking chantix   If you have any questions or concerns, please call our clinic at [phone] between 9am-5pm and after hours call [phone] and ask for the internal medicine resident on call. If you feel you are having a medical emergency please call 911.   Thank you, we look forward to help you remain healthy!  Lars Mage, MD Internal Medicine PGY3

## 2019-05-14 NOTE — Assessment & Plan Note (Signed)
The patient states that she is becoming more winded recently. She is needing to use albuterol inhaler 4-5 times during the day and 4 times at night.   -started patient on low dose ics (pulmicort 157mcg bid) -continue albuterol prn

## 2019-05-15 NOTE — Progress Notes (Signed)
Internal Medicine Clinic Attending  Case discussed with Dr. Chundi at the time of the visit.  We reviewed the resident's history and exam and pertinent patient test results.  I agree with the assessment, diagnosis, and plan of care documented in the resident's note. 

## 2019-05-18 ENCOUNTER — Telehealth: Payer: Self-pay | Admitting: *Deleted

## 2019-05-18 NOTE — Telephone Encounter (Signed)
That is fine, they can fill the respules instead

## 2019-05-18 NOTE — Telephone Encounter (Signed)
Pulmicort Flexhaler prescribed for patient.  Non preferred.  Patient will need to try and fail Pulmicort Respules 0.25 mg or 0.5 mg or 1 mg.   Message sent to Dr. Maricela Bo to consider a change in medication.  Sander Nephew, RN 05/18/2019 3:37 PM.

## 2019-05-20 ENCOUNTER — Other Ambulatory Visit: Payer: Self-pay | Admitting: Internal Medicine

## 2019-05-20 NOTE — Telephone Encounter (Signed)
I am not finding respules as an option

## 2019-05-20 NOTE — Telephone Encounter (Signed)
Thanks.  Would you mind sending in the script.  It comes in 0.25 mg, 0.5 mg and 1 mg.

## 2019-05-22 ENCOUNTER — Other Ambulatory Visit: Payer: Self-pay | Admitting: Internal Medicine

## 2019-05-22 ENCOUNTER — Ambulatory Visit (HOSPITAL_COMMUNITY)
Admission: RE | Admit: 2019-05-22 | Discharge: 2019-05-22 | Disposition: A | Discharge status: Home / Self Care | Payer: Medicaid Other | Source: Ambulatory Visit | Attending: Internal Medicine | Admitting: Internal Medicine

## 2019-05-22 ENCOUNTER — Other Ambulatory Visit: Payer: Self-pay

## 2019-05-22 DIAGNOSIS — I340 Nonrheumatic mitral (valve) insufficiency: Secondary | ICD-10-CM | POA: Insufficient documentation

## 2019-05-22 DIAGNOSIS — I119 Hypertensive heart disease without heart failure: Secondary | ICD-10-CM | POA: Insufficient documentation

## 2019-05-22 DIAGNOSIS — J4540 Moderate persistent asthma, uncomplicated: Secondary | ICD-10-CM

## 2019-05-22 DIAGNOSIS — I10 Essential (primary) hypertension: Secondary | ICD-10-CM | POA: Diagnosis not present

## 2019-05-22 DIAGNOSIS — R0600 Dyspnea, unspecified: Secondary | ICD-10-CM | POA: Insufficient documentation

## 2019-05-22 MED ORDER — FLOVENT HFA 220 MCG/ACT IN AERO: 2.0000 | INHALATION_SPRAY | Freq: Two times a day (BID) | RESPIRATORY_TRACT | 0 refills | Status: DC

## 2019-05-22 NOTE — Telephone Encounter (Signed)
Thank you :)

## 2019-05-22 NOTE — Progress Notes (Signed)
  Echocardiogram 2D Echocardiogram has been performed.  Maria Carpenter 05/22/2019, 3:20 PM

## 2019-05-22 NOTE — Telephone Encounter (Addendum)
Spoke with Pharmacist .  Maria Carpenter is covered by her Medicaid. Needs a prescription for. Pulmicort Flexhaler is not covered.  Message to be forwarded to Dr. Maricela Bo and [facility].  Sander Nephew, RN 05/22/2019 10:41 AM

## 2019-05-27 ENCOUNTER — Ambulatory Visit: Payer: Medicaid Other

## 2019-05-28 ENCOUNTER — Encounter: Payer: Self-pay | Admitting: Internal Medicine

## 2019-06-01 ENCOUNTER — Other Ambulatory Visit: Payer: Self-pay | Admitting: Internal Medicine

## 2019-06-03 ENCOUNTER — Other Ambulatory Visit: Payer: Self-pay | Admitting: Internal Medicine

## 2019-06-05 ENCOUNTER — Telehealth: Payer: Self-pay

## 2019-06-05 NOTE — Telephone Encounter (Signed)
I agree with ED eval ASAP. Thank you

## 2019-06-05 NOTE — Telephone Encounter (Signed)
Pt states she started out last night with chest pain. This am she has numbness of leg and very blurry vision. Denies short of  Breath, N&V, speech change. Have ask her to call 911 or come straight to ED with someone else driving. She is agreeable. Called ED chg nurse and updated him

## 2019-06-05 NOTE — Telephone Encounter (Signed)
Pt states her right leg is feeling numb, and having blurred vision. Requesting the nurse to call back.

## 2019-06-09 ENCOUNTER — Telehealth: Payer: Self-pay

## 2019-06-09 NOTE — Telephone Encounter (Signed)
Requesting to speak with Dr. Chundi for lab results. Please call pt back.  

## 2019-06-09 NOTE — Telephone Encounter (Signed)
Attempted to be called, left message to callback

## 2019-06-09 NOTE — Telephone Encounter (Signed)
Called pt - stated Dr Maricela Bo had called about her ECG results; would like a call back to discuss. Thanks

## 2019-06-09 NOTE — Telephone Encounter (Signed)
Ecg? The patient did not have an ecg done

## 2019-06-09 NOTE — Telephone Encounter (Signed)
9/25 pt had an echocardiogram

## 2019-06-10 ENCOUNTER — Encounter: Payer: Self-pay | Admitting: Internal Medicine

## 2019-06-10 ENCOUNTER — Ambulatory Visit: Payer: Medicaid Other | Admitting: Internal Medicine

## 2019-06-10 ENCOUNTER — Other Ambulatory Visit: Payer: Self-pay

## 2019-06-10 VITALS — BP 152/113 | HR 86 | Temp 99.1°F | Ht 67.0 in | Wt 363.8 lb

## 2019-06-10 DIAGNOSIS — I10 Essential (primary) hypertension: Secondary | ICD-10-CM

## 2019-06-10 DIAGNOSIS — R251 Tremor, unspecified: Secondary | ICD-10-CM | POA: Diagnosis not present

## 2019-06-10 DIAGNOSIS — G8929 Other chronic pain: Secondary | ICD-10-CM

## 2019-06-10 DIAGNOSIS — M545 Low back pain, unspecified: Secondary | ICD-10-CM | POA: Insufficient documentation

## 2019-06-10 DIAGNOSIS — Z79899 Other long term (current) drug therapy: Secondary | ICD-10-CM | POA: Diagnosis not present

## 2019-06-10 DIAGNOSIS — R300 Dysuria: Secondary | ICD-10-CM | POA: Diagnosis not present

## 2019-06-10 DIAGNOSIS — R109 Unspecified abdominal pain: Secondary | ICD-10-CM

## 2019-06-10 DIAGNOSIS — Z79891 Long term (current) use of opiate analgesic: Secondary | ICD-10-CM

## 2019-06-10 MED ORDER — TRAMADOL HCL 50 MG PO TABS: 50.0000 mg | ORAL_TABLET | Freq: Four times a day (QID) | ORAL | 0 refills | Status: AC | PRN

## 2019-06-10 MED ORDER — LISINOPRIL 40 MG PO TABS: 40.0000 mg | ORAL_TABLET | Freq: Every day | ORAL | 2 refills | Status: DC

## 2019-06-10 MED ORDER — HYDROCHLOROTHIAZIDE 25 MG PO TABS: 25.0000 mg | ORAL_TABLET | Freq: Every day | ORAL | 2 refills | Status: DC

## 2019-06-10 MED ORDER — CYCLOBENZAPRINE HCL 7.5 MG PO TABS: 7.5000 mg | ORAL_TABLET | Freq: Three times a day (TID) | ORAL | 0 refills | Status: AC | PRN

## 2019-06-10 NOTE — Assessment & Plan Note (Addendum)
Patient requesting refill of previously prescribed tramadol for ongoing pain to get her to her upcoming urogynocology appointment, which was provided. - Tramadol q6h PRN for 2 weeks

## 2019-06-10 NOTE — Assessment & Plan Note (Addendum)
Patient presenting for 2 week blood pressure follow up. BP remains elevated at 152/113 today. Will increase dose of lisinopril. Unfortunately we will need to split her combination pill to two separate meds to do this. She is okay with this and states she will take all medications. IF be remains elevated at follow, consider changing HCTZ to chlorthalidone and increasing to 50mg .  Patient also asked for result of Echo, which showed EF 60-65%, impaired relaxation, and some LVH. She was informed that we would continue to monitor for signs/symtpoms of heart failure. - Lisinopril 40mg  Daily - HCTZ 25mg  Daily - Amlodipine 10mg  Daily - Propranolol 40mg  BID (dual indication of SVT)

## 2019-06-10 NOTE — Progress Notes (Signed)
   CC: Hypertension, Abdominal Pain, Low back pain  HPI:  [name] is a 38 y.o. F with PMHx listed below presenting for Hypertension, Abdominal Pain, Low back pain. Please see the A&P for the status of the patient's chronic medical problems.  Past Medical History:  Diagnosis Date  . Anemia   . Asthma   . Chlamydia 2012   treated  . Complication of anesthesia    " they have trouble waking me up"  . Diabetes mellitus    A2DM during pregnancy. now on metformin.   . Family history of anesthesia complication    mother & daughter   . GERD (gastroesophageal reflux disease)   . Gonorrhea 2010   treated  . Hypertension   . Lupus ([place]) 10-2013  . Morbid obesity ([place]) 07/22/2011  . Pregnancy induced hypertension    pre E with pregnancies  . Rheumatoid arthritis(714.0)   . Shortness of breath   . Sleep apnea    does not use cpap  . Stroke [facility]) 2012   March 2012>right side deficit (speech, upper/ower weakness)  . SVT (supraventricular tachycardia) ([place])    Review of Systems:  Performed and all others negative.   Physical Exam:  Vitals:   06/10/19 1356  BP: (!) 152/113  Pulse: 86  Temp: 99.1 F (37.3 C)  TempSrc: Oral  SpO2: 100%  Weight: (!) 363 lb 12.8 oz (165 kg)  Height: 5\' 7"  (1.702 m)   Physical Exam Constitutional:      General: She is not in acute distress.    Appearance: Normal appearance. She is obese.  Cardiovascular:     Rate and Rhythm: Normal rate and regular rhythm.     Pulses: Normal pulses.     Heart sounds: Normal heart sounds.  Pulmonary:     Effort: Pulmonary effort is normal. No respiratory distress.     Breath sounds: Normal breath sounds.  Abdominal:     General: Bowel sounds are normal. There is no distension.     Palpations: Abdomen is soft.     Tenderness: There is no abdominal tenderness.  Musculoskeletal:        General: No swelling or deformity.     Comments: Low back tender to palpation over paraspinal musculature   Skin:    General: Skin is warm and dry.  Neurological:     General: No focal deficit present.     Mental Status: Mental status is at baseline.     Comments: Tremor of right hand     Assessment & Plan:   See Encounters Tab for problem based charting.  Patient discussed with Dr. Evette Doffing

## 2019-06-10 NOTE — Patient Instructions (Addendum)
Thank you for allowing [place] to care for you  For your high blood pressure - BP remains elevated today - STOP lisinopril-hydrochlorothiazide combination pill, we can dispose of these for you if you need - Start Lisinopril 40mg  Daily - Start Hydrochlorothiazide 25mg  Daily - Continue Amlodipine and propranolol  For your abdmoinal pain - Tramadol refilled - Follow up with Urology and/or Gynecology   For your chronic low back pain - Trial of muscle relaxant today   Follow up with PCP in the next few weeks

## 2019-06-10 NOTE — Assessment & Plan Note (Signed)
Patient report chronic low back pain. She has tender paraspinal musculature and report muscle tightness. Will give a trial of muscle relaxant and have her follow up with PCP for chronic problems. - Flexeril TID PRN, x 2 weeks - PCP follow up

## 2019-06-11 ENCOUNTER — Ambulatory Visit (INDEPENDENT_AMBULATORY_CARE_PROVIDER_SITE_OTHER): Payer: Medicaid Other | Admitting: Internal Medicine

## 2019-06-11 ENCOUNTER — Other Ambulatory Visit: Payer: Self-pay

## 2019-06-11 VITALS — BP 149/102 | HR 90 | Temp 98.2°F | Ht 67.0 in | Wt 365.9 lb

## 2019-06-11 DIAGNOSIS — Z794 Long term (current) use of insulin: Secondary | ICD-10-CM | POA: Diagnosis not present

## 2019-06-11 DIAGNOSIS — G479 Sleep disorder, unspecified: Secondary | ICD-10-CM | POA: Diagnosis not present

## 2019-06-11 DIAGNOSIS — E1169 Type 2 diabetes mellitus with other specified complication: Secondary | ICD-10-CM | POA: Diagnosis not present

## 2019-06-11 DIAGNOSIS — G4733 Obstructive sleep apnea (adult) (pediatric): Secondary | ICD-10-CM

## 2019-06-11 DIAGNOSIS — E119 Type 2 diabetes mellitus without complications: Secondary | ICD-10-CM | POA: Diagnosis not present

## 2019-06-11 DIAGNOSIS — R3915 Urgency of urination: Secondary | ICD-10-CM | POA: Diagnosis not present

## 2019-06-11 LAB — POCT GLYCOSYLATED HEMOGLOBIN (HGB A1C): Hemoglobin A1C: 11 % — AB (ref 4.0–5.6)

## 2019-06-11 LAB — GLUCOSE, CAPILLARY: Glucose-Capillary: 346 mg/dL — ABNORMAL HIGH (ref 70–99)

## 2019-06-11 MED ORDER — BLOOD GLUCOSE MONITOR KIT: PACK | 0 refills | Status: DC

## 2019-06-11 MED ORDER — PEN NEEDLES 32G X 5 MM MISC: 1.0000 | Freq: Every day | 3 refills | Status: DC

## 2019-06-11 MED ORDER — LANTUS SOLOSTAR 100 UNIT/ML ~~LOC~~ SOPN: 15.0000 [IU] | PEN_INJECTOR | Freq: Every day | SUBCUTANEOUS | 11 refills | Status: DC

## 2019-06-11 MED ORDER — METFORMIN HCL 1000 MG PO TABS: 1000.0000 mg | ORAL_TABLET | Freq: Two times a day (BID) | ORAL | 2 refills | Status: DC

## 2019-06-11 MED ORDER — METFORMIN HCL 500 MG PO TABS: 500.0000 mg | ORAL_TABLET | Freq: Two times a day (BID) | ORAL | 2 refills | Status: DC

## 2019-06-11 NOTE — Assessment & Plan Note (Addendum)
Patient has a history of sleep apnea with snoring and sleep disturbance, but there is no sleep study in the chart. Will order study to further investigate.  - Sleep study orderd

## 2019-06-11 NOTE — Patient Instructions (Addendum)
Thank yo for allowing [place] to care for you  For your Diabetes - A1c increased today to 11 - We will increase your dose of metformin to 1075m Twice a day - We will also start long acting insulin, Lantus, 15U Daily - We have prescribed a monitoring kit, please check before each meal - We have referred you to our diabetic educator and nutritionist - Referral to eye doctor  For your Sleep Apnea - Sleep study ordered  Please follow up with our educator  Please follow up with PCP as scheduled

## 2019-06-11 NOTE — Assessment & Plan Note (Addendum)
Patient presents after significant glucosuria noted at urology visit earlier today. Repeat A1c is 11, up from 7.8 less than 3 months ago. She has been taking metformin 500mg  BID as prescribed and tolerating this well. Will increase metformin to 1000mg  BID and Start Insulin given increasing A1c despite medication therapy. We will also refer to ophthalmology as she report some blurry vision and BP control is improving. Will also order meter as she does not have one and refer to our diabetic educator for diease education, nutritional education, and closer monitoring. - Metformin 1000mg  - Insulin 15U Daily, Pen with Needles - Glucometer, Lancets, and Strips - Referral to nutrition and diabetic services - Referral to ophthalmology - Follow up in about 2 weeks with PCP already scheduled

## 2019-06-11 NOTE — Progress Notes (Signed)
Internal Medicine Clinic Attending  Case discussed with Dr. Melvin  at the time of the visit.  We reviewed the resident's history and exam and pertinent patient test results.  I agree with the assessment, diagnosis, and plan of care documented in the resident's note.  

## 2019-06-11 NOTE — Progress Notes (Signed)
Internal Medicine Clinic Attending  Case discussed with Dr. Melvin at the time of the visit.  We reviewed the resident's history and exam and pertinent patient test results.  I agree with the assessment, diagnosis, and plan of care documented in the resident's note.  Alexander Raines, M.D., Ph.D.  

## 2019-06-11 NOTE — Progress Notes (Signed)
   CC: Diabetes, OSA   HPI:   [name] is a 38 y.o. F with PMHx listed below presenting for Diabetes, OSA. Please see the A&P for the status of the patient's chronic medical problems.  Past Medical History:  Diagnosis Date  . Anemia   . Asthma   . Chlamydia 2012   treated  . Complication of anesthesia    " they have trouble waking me up"  . Diabetes mellitus    A2DM during pregnancy. now on metformin.   . Family history of anesthesia complication    mother & daughter   . GERD (gastroesophageal reflux disease)   . Gonorrhea 2010   treated  . Hypertension   . Lupus ([place]) 10-2013  . Morbid obesity ([place]) 07/22/2011  . Pregnancy induced hypertension    pre E with pregnancies  . Rheumatoid arthritis(714.0)   . Shortness of breath   . Sleep apnea    does not use cpap  . Stroke [facility]) 2012   March 2012>right side deficit (speech, upper/ower weakness)  . SVT (supraventricular tachycardia) ([place])    Review of Systems:  Performed and all others negative.  Physical Exam:  Vitals:   06/11/19 1350  BP: (!) 149/102  Pulse: 90  Temp: 98.2 F (36.8 C)  TempSrc: Oral  SpO2: 100%  Weight: (!) 365 lb 14.4 oz (166 kg)  Height: 5\' 7"  (1.702 m)   Physical Exam Constitutional:      General: She is not in acute distress.    Appearance: Normal appearance. She is obese.  Cardiovascular:     Rate and Rhythm: Normal rate and regular rhythm.     Pulses: Normal pulses.     Heart sounds: Normal heart sounds.  Pulmonary:     Effort: Pulmonary effort is normal. No respiratory distress.     Breath sounds: Normal breath sounds.  Abdominal:     General: Bowel sounds are normal. There is no distension.     Palpations: Abdomen is soft.     Tenderness: There is no abdominal tenderness.  Musculoskeletal:        General: Swelling present. No deformity.     Comments: Trace bilateral LE swelling  Skin:    General: Skin is warm and dry.  Neurological:     General: No focal  deficit present.     Mental Status: Mental status is at baseline.    Assessment & Plan:   See Encounters Tab for problem based charting.  Patient discussed with Dr. Rebeca Alert

## 2019-06-24 NOTE — Addendum Note (Signed)
Addended by: Hulan Fray on: 06/24/2019 01:02 PM   Modules accepted: Orders

## 2019-06-25 ENCOUNTER — Encounter: Payer: Self-pay | Admitting: Internal Medicine

## 2019-06-25 ENCOUNTER — Encounter: Payer: Self-pay | Admitting: Dietician

## 2019-06-25 ENCOUNTER — Ambulatory Visit: Payer: Medicaid Other | Admitting: Dietician

## 2019-06-25 ENCOUNTER — Other Ambulatory Visit: Payer: Self-pay

## 2019-06-25 ENCOUNTER — Ambulatory Visit: Payer: Medicaid Other | Admitting: Internal Medicine

## 2019-06-25 DIAGNOSIS — I10 Essential (primary) hypertension: Secondary | ICD-10-CM | POA: Diagnosis not present

## 2019-06-25 DIAGNOSIS — K5900 Constipation, unspecified: Secondary | ICD-10-CM

## 2019-06-25 DIAGNOSIS — E119 Type 2 diabetes mellitus without complications: Secondary | ICD-10-CM | POA: Diagnosis not present

## 2019-06-25 DIAGNOSIS — Z794 Long term (current) use of insulin: Secondary | ICD-10-CM | POA: Diagnosis not present

## 2019-06-25 DIAGNOSIS — H538 Other visual disturbances: Secondary | ICD-10-CM | POA: Diagnosis not present

## 2019-06-25 DIAGNOSIS — Z79899 Other long term (current) drug therapy: Secondary | ICD-10-CM

## 2019-06-25 DIAGNOSIS — E1169 Type 2 diabetes mellitus with other specified complication: Secondary | ICD-10-CM

## 2019-06-25 LAB — GLUCOSE, CAPILLARY: Glucose-Capillary: 302 mg/dL — ABNORMAL HIGH (ref 70–99)

## 2019-06-25 MED ORDER — LANTUS SOLOSTAR 100 UNIT/ML ~~LOC~~ SOPN: 19.0000 [IU] | PEN_INJECTOR | Freq: Every day | SUBCUTANEOUS | 11 refills | Status: DC

## 2019-06-25 NOTE — Progress Notes (Signed)
Diabetes Self-Management Education  Visit Type: Follow-up  Appt. Start Time: 1615 Appt. End Time: 1630  06/25/2019  Ms. Georgana Curio, identified by name and date of birth, is a 38 y.o. female with a diagnosis of Diabetes: (dx date 08/2009).   ASSESSMENT  Ms. Andujo reports very hgih blood sugars despite taking her medicine as directed, eating a fairly low carb and high fiber diet and drinking only water. She does not think she has an infection. After speaking with Dr. Myrtie Hawk, taught patient how to self titrate lantus 2 units each night until her fasting blood sugars are < 150mg /dl, Ms Diemer verbalized understanding that she is to stop increasing her lantus dose once she attains a fasting blood suga rof < 150 mg/dl and to continue that same dose.   Diabetes Self-Management Education - 06/25/19 1400      Visit Information   Visit Type  Follow-up      Initial Visit   Diabetes Type  --   dx date 08/2009   Are you currently following a meal plan?  Yes      Complications   Number of hyperglycemic episodes per week  10      Dietary Intake   Breakfast  1/2 c oatmeal    Lunch  salad, grilled chicken wrap    Dinner  skipped last night    Beverage(s)  water      Patient Education   Medications  Reviewed patients medication for diabetes, action, purpose, timing of dose and side effects.      Individualized Goals (developed by patient)   Medications  take my medication as prescribed    Monitoring   test my blood glucose as discussed      Patient Self-Evaluation of Goals - Patient rates self as meeting previously set goals (% of time)   Nutrition  >75%      Outcomes   Expected Outcomes  Demonstrated interest in learning. Expect positive outcomes    Future DMSE  2 wks    Program Status  Not Completed      Subsequent Visit   Since your last visit have you continued or begun to take your medications as prescribed?  Yes    Since your last visit have you had your blood pressure  checked?  Yes    Is your most recent blood pressure lower, unchanged, or higher since your last visit?  Lower    Since your last visit have you experienced any weight changes?  Gain    Weight Gain (lbs)  6    Since your last visit, are you checking your blood glucose at least once a day?  Yes   agrees to brgin her meter next visit      Individualized Plan for Diabetes Self-Management Training:   Learning Objective:  Patient will have a greater understanding of diabetes self-management. Patient education plan is to attend individual and/or group sessions per assessed needs and concerns.   Plan:   There are no Patient Instructions on file for this visit.  Expected Outcomes:  Demonstrated interest in learning. Expect positive outcomes  Education material provided: Diabetes Resources  If problems or questions, patient to contact team via:  Phone  Future DSME appointment: 2 wks  Debera Lat, RD 06/25/2019 4:36 PM.

## 2019-06-25 NOTE — Assessment & Plan Note (Addendum)
BP improved to 137/88.  The patient's blood pressure during last visits were 149/102 and 152/113. The patient is currently taking Lisinopril 40 mg QD, Amlodipine 10 mg QD, HCTZ 25 mg QD and Propranolol 40 mg BID (for SVT)  -Continue current antihypertensive regimen and monitor BP (next visit in 2 weeks)

## 2019-06-25 NOTE — Patient Instructions (Signed)
It was our pleasure taking care of you in our clinic today.  You were seen for follow up of your diabetes. Your blood sugar level was elevated at home so I increase the dose of insulin to 19 unit at night. Please continue taking rest of your medications as before.  Please come back to clinic in 2 weeks or earlier as needed. Check your blood sugar at home regularly as before and bring your glucometer with you next time.  Thank you Dr. Linna Hoff

## 2019-06-25 NOTE — Progress Notes (Signed)
   CC: Follow up of DM  HPI:  [name] is a 38 y.o. female with PMHx as documented below, presented with. Please refer to problem based charting for further details and assessment of plan of current problem and chronic medical conditions.   Past Medical History:  Diagnosis Date  . Anemia   . Asthma   . Chlamydia 2012   treated  . Complication of anesthesia    " they have trouble waking me up"  . Diabetes mellitus    A2DM during pregnancy. now on metformin.   . Family history of anesthesia complication    mother & daughter   . GERD (gastroesophageal reflux disease)   . Gonorrhea 2010   treated  . Hypertension   . Lupus ([place]) 10-2013  . Morbid obesity ([place]) 07/22/2011  . Pregnancy induced hypertension    pre E with pregnancies  . Rheumatoid arthritis(714.0)   . Shortness of breath   . Sleep apnea    does not use cpap  . Stroke [facility]) 2012   March 2012>right side deficit (speech, upper/ower weakness)  . SVT (supraventricular tachycardia) (H[facility])    Review of Systems:  Review of Systems  Eyes: Positive for blurred vision.       Right blurred vision  Gastrointestinal: Positive for constipation. Negative for nausea and vomiting.    Physical Exam:  Vitals:   06/25/19 1430  BP: 137/88  Pulse: 92  Temp: 98.4 F (36.9 C)  TempSrc: Oral  SpO2: 100%  Weight: (!) 369 lb 11.2 oz (167.7 kg)  Height: 5\' 7"  (1.702 m)   Physical Exam  Constitutional: morbidly obese lady, in no acute distress Eyes: Sclera non icteric, EOM nl Cardiovascular:  RRR, nl S1S2, no murmur,  trace LEE Respiratory: Effort normal and breath sounds normal. No respiratory distress. No wheezes.  GI: Soft. Bowel sounds are normal. No distension. There is no tenderness.    Assessment & Plan:   See Encounters Tab for problem based charting.  Patient discussed with Dr. Evette Doffing

## 2019-06-25 NOTE — Assessment & Plan Note (Addendum)
Patient started on Insulin 2 weeks ago given HbA1c 11.  (up from 7.8 on prior test) BG at home ranged 250-300.  -Increasing Lantus to 19 u at night. Patient was seen by Ns. Donna-diabetic coordinator. I agree that she self titrate (increase 2 units each night until reaches morning fasting BG <151) not more than 30 u daily.  -F/u in clinic in 2 weeks -Continue Metformin 1 gr BID

## 2019-06-26 NOTE — Progress Notes (Signed)
Internal Medicine Clinic Attending  Case discussed with Dr. Masoudi  at the time of the visit.  We reviewed the resident's history and exam and pertinent patient test results.  I agree with the assessment, diagnosis, and plan of care documented in the resident's note.  

## 2019-06-30 ENCOUNTER — Telehealth: Payer: Self-pay | Admitting: Dietician

## 2019-06-30 DIAGNOSIS — H538 Other visual disturbances: Secondary | ICD-10-CM | POA: Diagnosis not present

## 2019-06-30 DIAGNOSIS — E119 Type 2 diabetes mellitus without complications: Secondary | ICD-10-CM | POA: Diagnosis not present

## 2019-06-30 DIAGNOSIS — H16223 Keratoconjunctivitis sicca, not specified as Sjogren's, bilateral: Secondary | ICD-10-CM | POA: Diagnosis not present

## 2019-06-30 DIAGNOSIS — H35033 Hypertensive retinopathy, bilateral: Secondary | ICD-10-CM | POA: Diagnosis not present

## 2019-06-30 DIAGNOSIS — H532 Diplopia: Secondary | ICD-10-CM | POA: Diagnosis not present

## 2019-06-30 LAB — HM DIABETES EYE EXAM

## 2019-06-30 NOTE — Telephone Encounter (Signed)
Call to follow up on insulin self titration: Maria Carpenter states her blood sugars are still high fasting 278 and 254 for the last two days, she took 22 units per night of lantus, is following her meal plan and drinking water. She feels she needs something else during the day. Consider a GLP-1 or SGLT-2i.

## 2019-07-01 ENCOUNTER — Other Ambulatory Visit: Payer: Self-pay | Admitting: Internal Medicine

## 2019-07-01 ENCOUNTER — Encounter: Payer: Self-pay | Admitting: *Deleted

## 2019-07-02 NOTE — Telephone Encounter (Signed)
Left a message for patient to make an appointment around 07/09/2019 and to bring her meter with her.

## 2019-07-09 ENCOUNTER — Other Ambulatory Visit (HOSPITAL_COMMUNITY): Payer: Medicaid Other

## 2019-07-09 DIAGNOSIS — L732 Hidradenitis suppurativa: Secondary | ICD-10-CM | POA: Diagnosis not present

## 2019-07-09 DIAGNOSIS — M329 Systemic lupus erythematosus, unspecified: Secondary | ICD-10-CM | POA: Diagnosis not present

## 2019-07-11 ENCOUNTER — Other Ambulatory Visit: Payer: Self-pay | Admitting: Internal Medicine

## 2019-07-12 ENCOUNTER — Encounter (HOSPITAL_BASED_OUTPATIENT_CLINIC_OR_DEPARTMENT_OTHER): Payer: Medicaid Other | Admitting: Internal Medicine

## 2019-07-15 NOTE — Telephone Encounter (Addendum)
Appropriate for refill but needs to follow up at [facility] clinic for hydradenitis.  Has appt 07/16/19 (tomorrow) with pcp.Despina Hidden Cassady11/18/20202:58 PM

## 2019-07-16 ENCOUNTER — Ambulatory Visit: Payer: Medicaid Other | Admitting: Internal Medicine

## 2019-07-16 ENCOUNTER — Ambulatory Visit (INDEPENDENT_AMBULATORY_CARE_PROVIDER_SITE_OTHER): Payer: Medicaid Other | Admitting: Dietician

## 2019-07-16 ENCOUNTER — Encounter: Payer: Self-pay | Admitting: Internal Medicine

## 2019-07-16 ENCOUNTER — Encounter: Payer: Self-pay | Admitting: Dietician

## 2019-07-16 ENCOUNTER — Other Ambulatory Visit: Payer: Self-pay

## 2019-07-16 VITALS — BP 152/93 | HR 89 | Temp 98.3°F | Ht 67.0 in | Wt 360.8 lb

## 2019-07-16 DIAGNOSIS — I10 Essential (primary) hypertension: Secondary | ICD-10-CM

## 2019-07-16 DIAGNOSIS — Z79899 Other long term (current) drug therapy: Secondary | ICD-10-CM | POA: Diagnosis not present

## 2019-07-16 DIAGNOSIS — E1169 Type 2 diabetes mellitus with other specified complication: Secondary | ICD-10-CM

## 2019-07-16 DIAGNOSIS — E119 Type 2 diabetes mellitus without complications: Secondary | ICD-10-CM

## 2019-07-16 DIAGNOSIS — Z794 Long term (current) use of insulin: Secondary | ICD-10-CM | POA: Diagnosis not present

## 2019-07-16 DIAGNOSIS — Z713 Dietary counseling and surveillance: Secondary | ICD-10-CM | POA: Diagnosis not present

## 2019-07-16 LAB — GLUCOSE, CAPILLARY: Glucose-Capillary: 244 mg/dL — ABNORMAL HIGH (ref 70–99)

## 2019-07-16 MED ORDER — INSULIN ASPART 100 UNIT/ML FLEXPEN: PEN_INJECTOR | SUBCUTANEOUS | 2 refills | Status: DC

## 2019-07-16 MED ORDER — LANTUS SOLOSTAR 100 UNIT/ML ~~LOC~~ SOPN: 25.0000 [IU] | PEN_INJECTOR | Freq: Every day | SUBCUTANEOUS | 11 refills | Status: DC

## 2019-07-16 MED ORDER — LANTUS SOLOSTAR 100 UNIT/ML ~~LOC~~ SOPN: 30.0000 [IU] | PEN_INJECTOR | Freq: Every day | SUBCUTANEOUS | 11 refills | Status: DC

## 2019-07-16 MED ORDER — OZEMPIC (0.25 OR 0.5 MG/DOSE) 2 MG/1.5ML ~~LOC~~ SOPN: 0.2500 mg | PEN_INJECTOR | SUBCUTANEOUS | 0 refills | Status: DC

## 2019-07-16 MED ORDER — PEN NEEDLES 32G X 5 MM MISC: 1.0000 | Freq: Every day | 3 refills | Status: DC

## 2019-07-16 NOTE — Assessment & Plan Note (Signed)
Bp today at 154/88 and PR 95. Patient denies any headache, SOB or dizziness. Will continue current dose of medication. SGLP-1 agonist that started today will hopefully help with controlling BP too. -Continue Amlodipine 10 mg QD and HCTZ 25 mg QD and Lisinopril 40 mg QD -low salt diet -f/u in clinic in 2 weeks for recheck of BP and BMP

## 2019-07-16 NOTE — Progress Notes (Addendum)
Diabetes Self-Management Education  Visit Type: Follow-up  Appt. Start Time: 1600 Appt. End Time: 1630  07/16/2019  Ms. Georgana Curio, identified by name and date of birth, is a 38 y.o. female with a diagnosis of Diabetes:  [name] Type 2  ASSESSMENT  Ms. Juenger reports symptoms of low blood sugar when glucose in the 100s. Advised her to treat symptoms judiciously. Her weight has dropped likely due to her strict adherence to her meal plan and also reports skipping meals to lower blood glucose. She states she is feeling much better with lower blood sugars.  She verbalized good understanding to there correction insulin scale and action and to only take 3 times a day before meals if needed.   Diabetes Self-Management Education - 07/16/19 1600      Visit Information   Visit Type  Follow-up      Initial Visit   Are you currently following a meal plan?  Yes    What type of meal plan do you follow?  --   low carb     Subsequent Visit   Since your last visit have you continued or begun to take your medications as prescribed?  Yes    Since your last visit have you had your blood pressure checked?  Yes    Is your most recent blood pressure lower, unchanged, or higher since your last visit?  Higher    Since your last visit have you experienced any weight changes?  Loss    Weight Loss (lbs)  --   9      Individualized Plan for Diabetes Self-Management Training:   Learning Objective:  Patient will have a greater understanding of diabetes self-management. Patient education plan is to attend individual and/or group sessions per assessed needs and concerns.   Plan:   Patient Instructions  Check blood sugar before each meal. For blood sugar 200 - 250 inject 4 units Novolog For blood sugar 251 - 300 inject 6 units Novolog For blood sugar 301 - 350 inject 8 units Novolog    Expected Outcomes:   Demonstrated interest in learning. Expect positve outcomes   Education material provided:  Diabetic resources   If problems or questions, patient to contact team via:  Phone  Future DSME appointment:     Arnold Long BS, [place], Dietetic Intern 07/16/19 16:52

## 2019-07-16 NOTE — Progress Notes (Signed)
I reviewed and approve this note and the plan. Debera Lat, RD 07/16/2019 5:12 PM.

## 2019-07-16 NOTE — Assessment & Plan Note (Signed)
Patient has increased Lantus dose to 24 u QD and is taking Metformin 1000 mg BID. Her BG level is still elevated during the day per her glucometer readings: Recent fasting AM BG: 200-240s  Lunch time-afternoon BG: 170-220 PM BG: 180-280 No hypoglycemia. BG average 234  She will take benefit of adding GLP-1 against given morbid obesity. We talked about starting Ozempic that patient agrees with it. (But this canceled considering her insurance coverage)  -ADDENDUM: Patient is intrested in trying Ozempic sample and then decide if she is ok to pay for it. Starting with 0.25 mg once weekly for a month and titrate as needed -Continue Lantus 24 u QD and Metformin 1000 mg BID -Adding meal time correction with short acting Insulin -F/u in clinic in 2 weeks

## 2019-07-16 NOTE — Addendum Note (Signed)
Addended byDewayne Hatch on: 07/16/2019 10:50 PM   Modules accepted: Orders

## 2019-07-16 NOTE — Progress Notes (Signed)
   CC: DM follow up  HPI:  [name] is a 38 y.o. female with PMHx as documented below, presented for DM follow up. Please refer to problem based charting for further details and assessment and  plan of current problem and chronic medical conditions.   Past Medical History:  Diagnosis Date  . Anemia   . Asthma   . Chlamydia 2012   treated  . Complication of anesthesia    " they have trouble waking me up"  . Diabetes mellitus    A2DM during pregnancy. now on metformin.   . Family history of anesthesia complication    mother & daughter   . GERD (gastroesophageal reflux disease)   . Gonorrhea 2010   treated  . Hypertension   . Lupus ([place]) 10-2013  . Morbid obesity ([place]) 07/22/2011  . Pregnancy induced hypertension    pre E with pregnancies  . Rheumatoid arthritis(714.0)   . Shortness of breath   . Sleep apnea    does not use cpap  . Stroke [facility]) 2012   March 2012>right side deficit (speech, upper/ower weakness)  . SVT (supraventricular tachycardia) ([place])    Review of Systems:  Review of Systems  Respiratory: Negative for shortness of breath.   Cardiovascular: Negative for leg swelling.  Neurological: Negative for dizziness and headaches.   Physical Exam:  Vitals:   07/16/19 1454  BP: (!) 154/88  Pulse: 95  Temp: 98.3 F (36.8 C)  TempSrc: Oral  SpO2: 100%  Weight: (!) 360 lb 12.8 oz (163.7 kg)  Height: 5\' 7"  (1.702 m)   Physical Exam  Constitutional: Morbidly obese young lady, sitting on the bed in no acute distress.  Head: Normocephalic and atraumatic.  Cardiovascular:  RRR, nl S1S2, no murmur,  no LEE Respiratory: Effort normal and breath sounds normal. No respiratory distress. No wheezes.  GI: Soft. Bowel sounds are normal. No distension. There is no tenderness.  Neurological: Is alert and oriented x 3  Skin: Not diaphoretic. No erythema.  Psychiatric: Normal mood and affect. Behavior is normal. Judgment and thought content normal.    Assessment & Plan:   See Encounters Tab for problem based charting.  Patient discussed with Dr. Evette Doffing

## 2019-07-16 NOTE — Patient Instructions (Addendum)
It was our pleasure taking care of you in our clinic today.  You were seen for follow up of your diabetes. Your blood sugar is still elevated. I will see you in 2 weeks (or sooner if needed) to reevaluate you and may add another medication. I also start you on sliding scale short acting Insulin.  (I send you to Ms. Butch Penny for education)  Please take your medications as instructed and contact us if you have any question or concern.  Please come back to clinic in 4 weeks for blood work and follow up.  Thank you

## 2019-07-16 NOTE — Patient Instructions (Addendum)
Maria Carpenter,   Here is the plan for your Novolog insulin :  1- Check blood sugar before each meal. 2- If blood sugar is <200 mg/dl do not take Novolog insulin For blood sugar 200 - 250 inject 4 units Novolog For blood sugar 251 - 300 inject 6 units Novolog For blood sugar 301 - 350 inject 8 units Novolog  See you in two weeks.   Butch Penny [phone]

## 2019-07-17 ENCOUNTER — Telehealth: Payer: Self-pay | Admitting: Dietician

## 2019-07-17 NOTE — Progress Notes (Signed)
Internal Medicine Clinic Attending  Case discussed with Dr. Masoudi  at the time of the visit.  We reviewed the resident's history and exam and pertinent patient test results.  I agree with the assessment, diagnosis, and plan of care documented in the resident's note.  

## 2019-07-17 NOTE — Telephone Encounter (Signed)
Called Maria Carpenter to follow up on her new diabetes medicine per Dr. Myrtie Hawk. Maria Carpenter says she took 30 units Lantus yesterday and woke up with a blood sugar of 224 this am, she took 3 units of Novolog (should have been 4 per the scale provided to her)  and her blood sugar came down to 196. She read the package insert to the Ozempic and is not taking it and doesn ot intend to because the package insert said not to take it if you have retinopathy. She has hypertensive retinopathy per her 06/30/19 eye exam.  She would like to continue on the Lantus 30 units daily and correction Novolog insulin for now.

## 2019-07-18 ENCOUNTER — Other Ambulatory Visit (HOSPITAL_COMMUNITY): Payer: Medicaid Other

## 2019-07-21 ENCOUNTER — Encounter: Payer: Self-pay | Admitting: Internal Medicine

## 2019-07-21 ENCOUNTER — Encounter (HOSPITAL_BASED_OUTPATIENT_CLINIC_OR_DEPARTMENT_OTHER): Payer: Medicaid Other | Admitting: Pulmonary Disease

## 2019-09-11 ENCOUNTER — Other Ambulatory Visit: Payer: Self-pay | Admitting: Internal Medicine

## 2019-09-11 DIAGNOSIS — I10 Essential (primary) hypertension: Secondary | ICD-10-CM

## 2020-01-25 IMAGING — CT CT NECK W/ CM
4 of 5 series · 15 of 33 positions shown, 17 images · IV contrast (Omni 300)
Comparison: None.

CLINICAL DATA: Dental pain on the right.  Recent tooth extraction.

EXAM:
CT NECK WITH CONTRAST
TECHNIQUE: Multidetector CT imaging of the neck was performed using the
standard protocol following the bolus administration of intravenous
contrast.
CONTRAST:  75mL F7JQN6-NVV IOPAMIDOL (F7JQN6-NVV) INJECTION 61%

[Series 5: neck 2.0 st · axial · 0.55mm/px · z∈[-280,-152]mm · 3 of 128 slices shown (1 of 3)]
[im 32/128  bone]
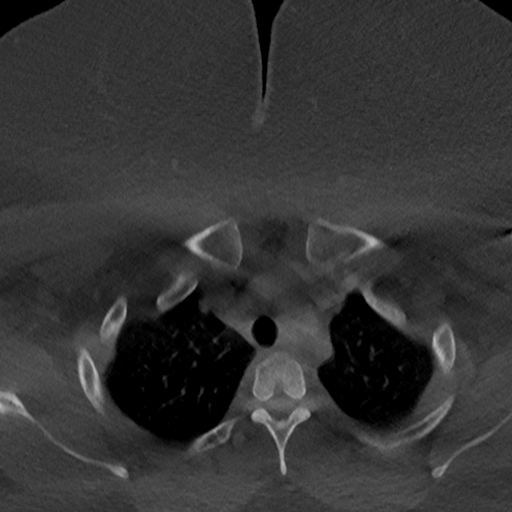
[im 64/128  bone]
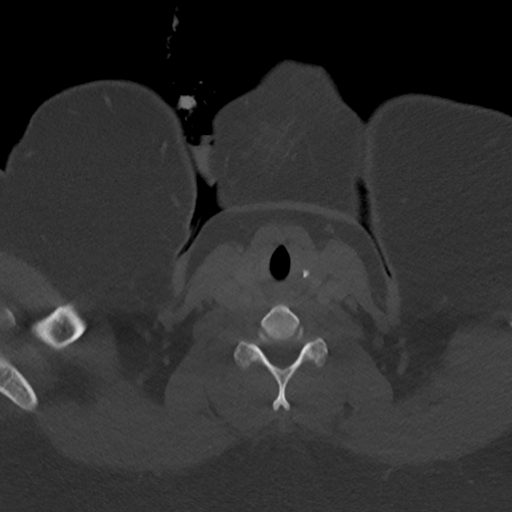
[im 96/128  bone]
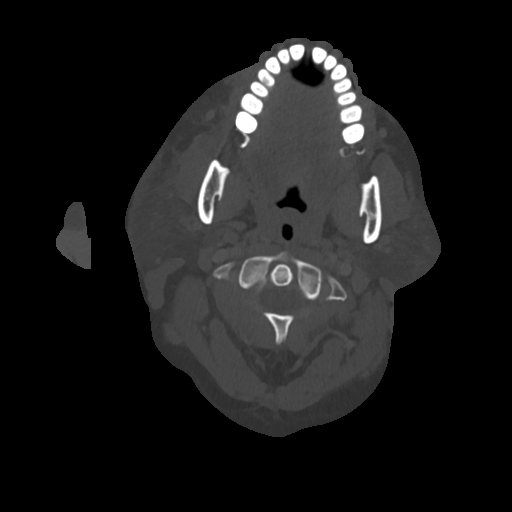

[Series 8: neck 2.0 st · sagittal · 0.50mm/px · 5 of 107 slices shown, 6 images (2 of 3)]
[im 36/107  bone]
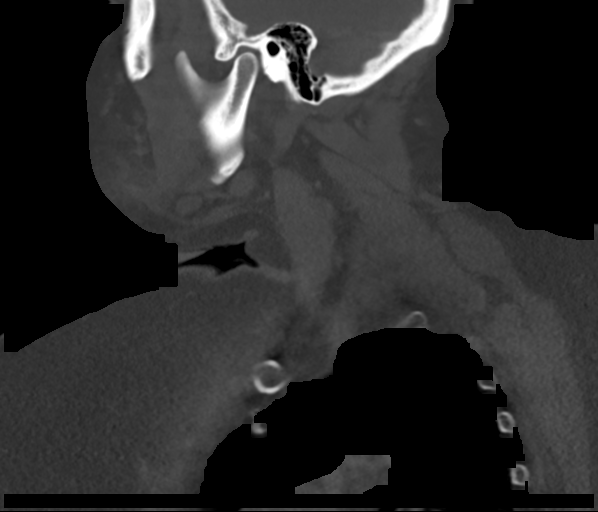
[im 45/107  bone]
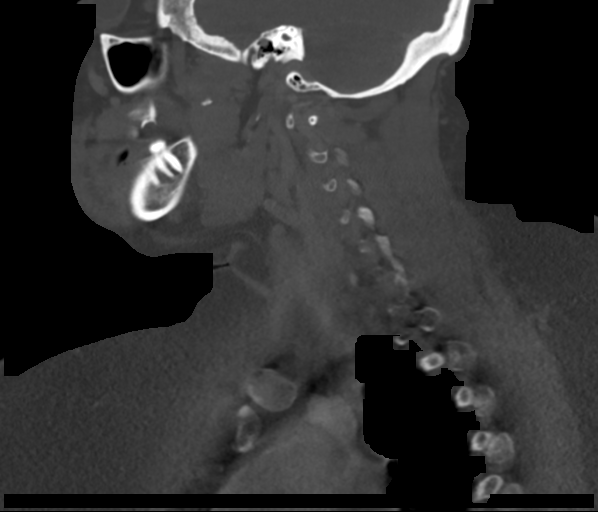
[im 54/107  soft-tissue]
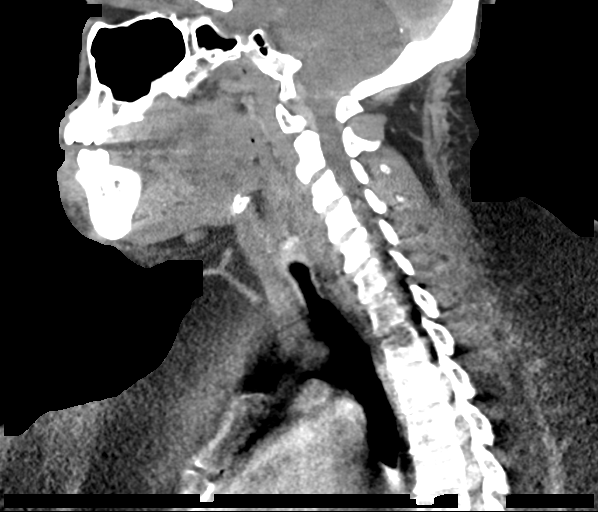
[im 54/107  bone]
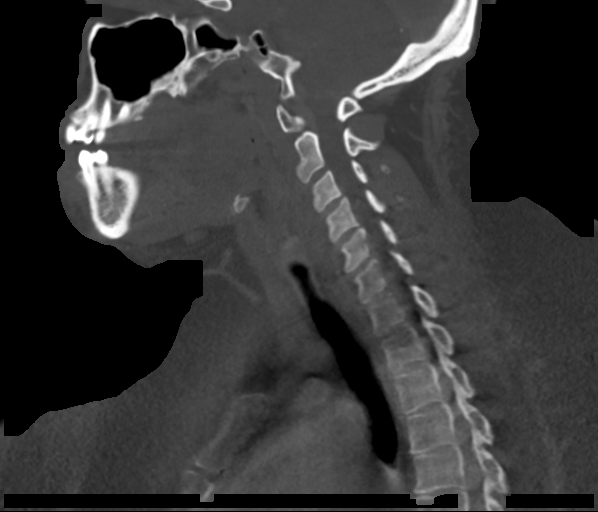
[im 62/107  bone]
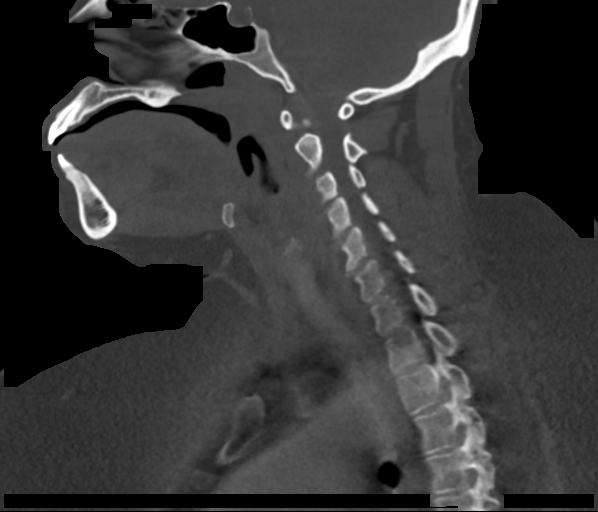
[im 71/107  bone]
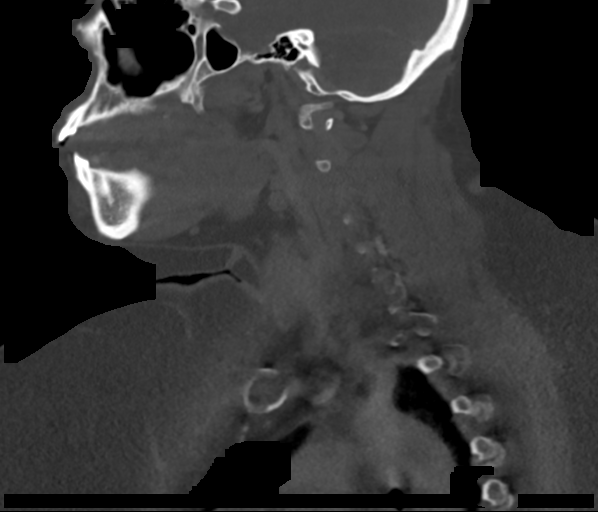

[Series 9: neck 2.0 st · coronal · 0.50mm/px · 3 of 130 slices shown (3 of 3)]
[im 26/130  bone]
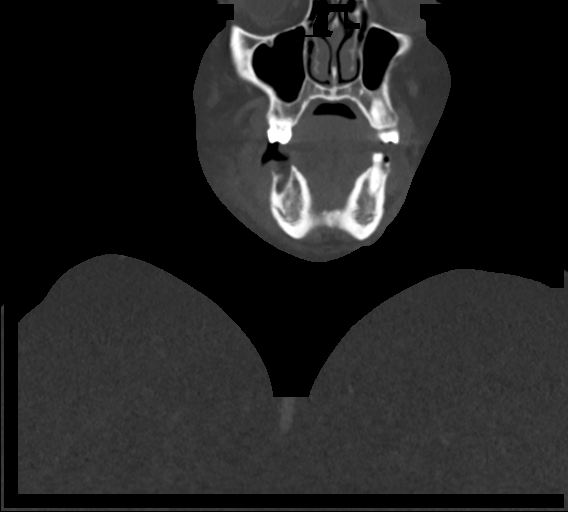
[im 52/130  bone]
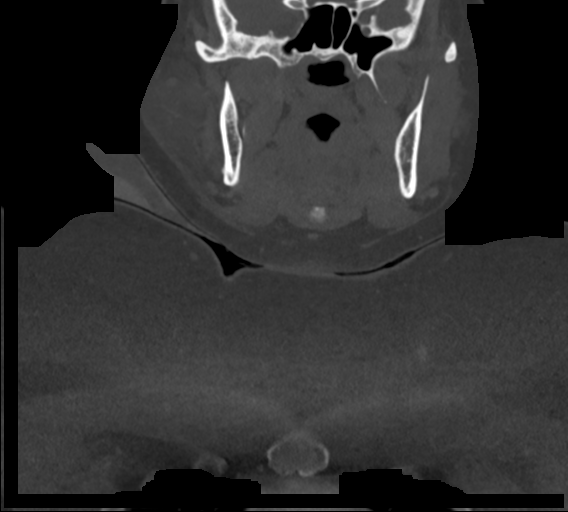
[im 78/130  bone]
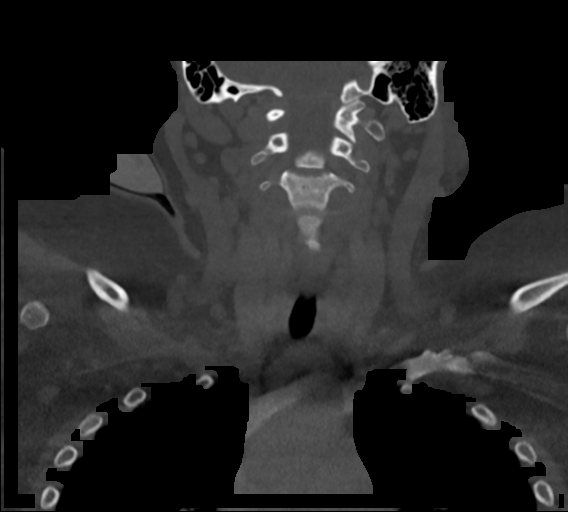

[Series 10: neck 2.0 st orthogonal · axial · 0.46mm/px · z∈[-351,-182]mm · 4 of 151 slices shown, 5 images]
[im 31/151  soft-tissue]
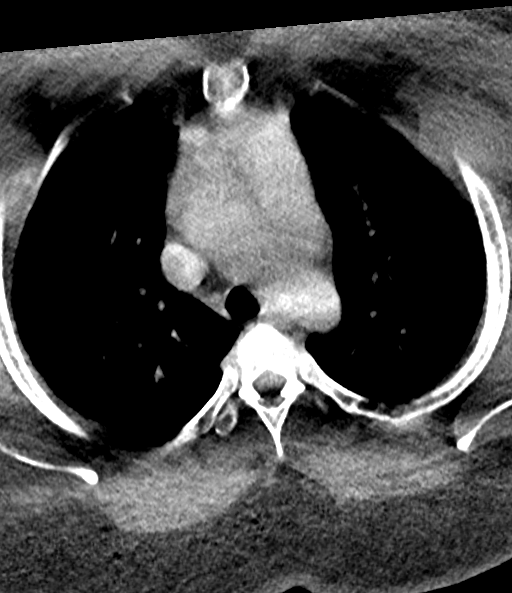
[im 31/151  bone]
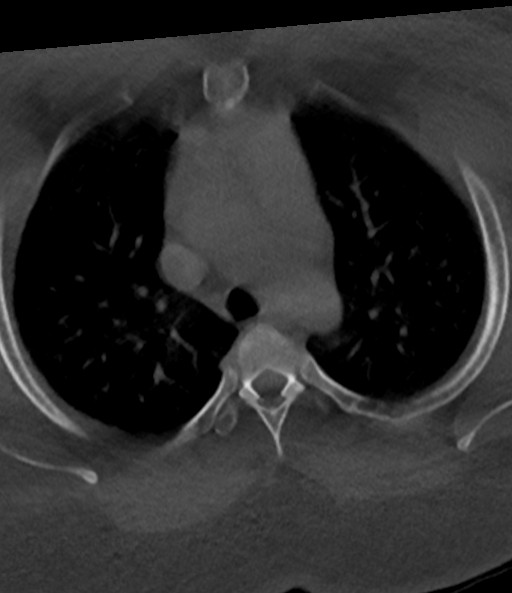
[im 61/151  bone]
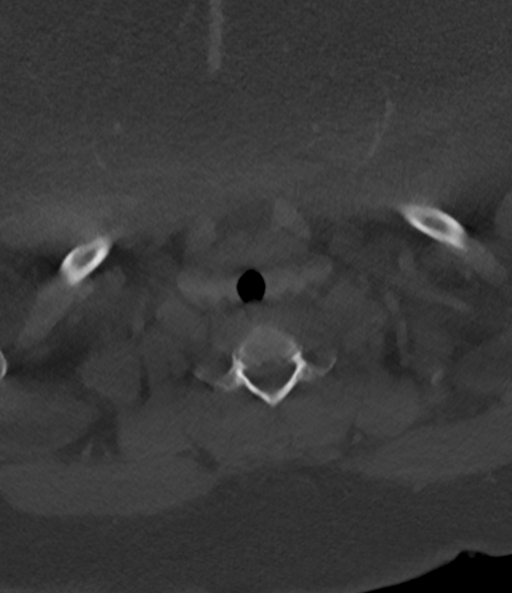
[im 91/151  bone]
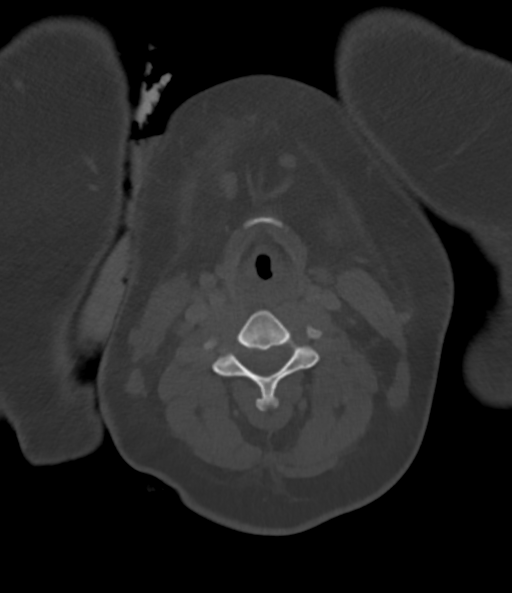
[im 121/151  bone]
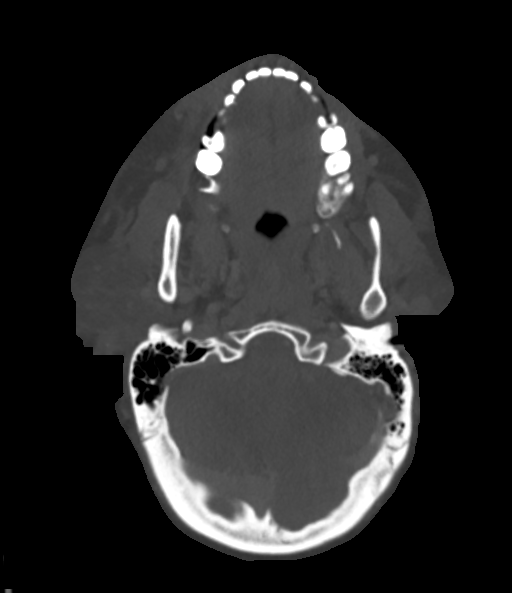

[15 of 33 positions shown; findings below may reference images not displayed]

FINDINGS: Pharynx and larynx: Negative.

Salivary glands: No inflammation, mass, or stone.

Thyroid: Normal.

Lymph nodes: Mild rounding and enlargement of right submandibular
lymph nodes that is considered reactive.

Vascular: Negative.

Limited intracranial: Negative.

Visualized orbits: Negative.

Mastoids and visualized paranasal sinuses: Clear.

Skeleton: No acute or aggressive process.

Upper chest: Negative.

Other: Subcutaneous fat stranding and expansion about the right jaw.
Recently extracted tooth 30 without residual tooth root or
neighboring abscess. Inflammation continues below the chin. No
swelling seen deep to the mylohyoid or about the central
compartment.

Contralateral first molar is carious with periapical erosion.
Bilateral terminal maxillary molars are carious.
IMPRESSION: 1. Cellulitis in the right face, reportedly odontogenic with recent
extraction. No abscess or floor of mouth swelling.
2. Additional carious molars.

## 2020-01-29 ENCOUNTER — Other Ambulatory Visit: Payer: Self-pay | Admitting: Internal Medicine

## 2020-01-29 DIAGNOSIS — J45909 Unspecified asthma, uncomplicated: Secondary | ICD-10-CM

## 2020-01-29 DIAGNOSIS — I10 Essential (primary) hypertension: Secondary | ICD-10-CM

## 2020-04-13 ENCOUNTER — Other Ambulatory Visit: Payer: Self-pay

## 2020-04-13 ENCOUNTER — Telehealth: Payer: Self-pay | Admitting: Dietician

## 2020-04-13 ENCOUNTER — Other Ambulatory Visit: Payer: Self-pay | Admitting: Internal Medicine

## 2020-04-13 ENCOUNTER — Encounter: Payer: Self-pay | Admitting: Internal Medicine

## 2020-04-13 ENCOUNTER — Ambulatory Visit (INDEPENDENT_AMBULATORY_CARE_PROVIDER_SITE_OTHER): Payer: Medicaid Other | Admitting: Internal Medicine

## 2020-04-13 ENCOUNTER — Ambulatory Visit (INDEPENDENT_AMBULATORY_CARE_PROVIDER_SITE_OTHER): Payer: Medicaid Other | Admitting: Dietician

## 2020-04-13 VITALS — BP 188/118 | HR 84 | Temp 99.3°F | Ht 67.0 in | Wt 360.1 lb

## 2020-04-13 DIAGNOSIS — E1169 Type 2 diabetes mellitus with other specified complication: Secondary | ICD-10-CM

## 2020-04-13 DIAGNOSIS — Z713 Dietary counseling and surveillance: Secondary | ICD-10-CM

## 2020-04-13 DIAGNOSIS — I10 Essential (primary) hypertension: Secondary | ICD-10-CM

## 2020-04-13 DIAGNOSIS — M329 Systemic lupus erythematosus, unspecified: Secondary | ICD-10-CM | POA: Diagnosis not present

## 2020-04-13 DIAGNOSIS — Z794 Long term (current) use of insulin: Secondary | ICD-10-CM | POA: Diagnosis not present

## 2020-04-13 LAB — POCT GLYCOSYLATED HEMOGLOBIN (HGB A1C): Hemoglobin A1C: 9.3 % — AB (ref 4.0–5.6)

## 2020-04-13 LAB — GLUCOSE, CAPILLARY: Glucose-Capillary: 233 mg/dL — ABNORMAL HIGH (ref 70–99)

## 2020-04-13 MED ORDER — TRULICITY 0.75 MG/0.5ML ~~LOC~~ SOAJ: 0.7500 mg | SUBCUTANEOUS | 0 refills | Status: DC

## 2020-04-13 MED ORDER — SPIRONOLACTONE 25 MG PO TABS: 25.0000 mg | ORAL_TABLET | Freq: Every day | ORAL | 2 refills | Status: DC

## 2020-04-13 NOTE — Assessment & Plan Note (Signed)
Placing order for ambulatory referral to rheumatologist, and no follow-up for years.  She reports significant photosensitivity and some joint pain.  -Ambulatory referral to rheumatology

## 2020-04-13 NOTE — Telephone Encounter (Signed)
Called patient to arrange a follow up diabetes self management education appointment for today. She agreed. She  Reports that she has not needed any insulin including the lantus for a while because she has lost weight and her blood sugars are in the 90s. She does report symptoms of low blood sugar at times despite not taking any insulin that she hopes to discuss today at her appointments.

## 2020-04-13 NOTE — Assessment & Plan Note (Signed)
Anticipating improvement and weight loss, being on GLP-1 agonist for diabetes.

## 2020-04-13 NOTE — Patient Instructions (Addendum)
Thank you for allowing [place] to provide your care today. Today we discussed your diabetes, hypertension, lupus.  -For your diabetes, please stop Ozempic and start Trulicity.  I provided you 1 month supply sample for Trulicity.  -Please inject 0.75 mg under your skin once a week.  Keep the pens refrigerated until you are ready to use. -Continue Metformin, NovoLog, Lantus.  Diabetes -Please make sure to check your blood sugar at home as discussed -I refer you to podiatrist for foot care -Follow-up in clinic in 2 weeks  For your elevated blood pressure, I start a new medication for you. -Please take as spironolactone 1 tablet (total of 25 mg) once a day -Continue rest of your medications including amlodipine, lisinopril, HCTZ, propranolol, as before. -I placed an order for ultrasound of kidney vessels.  I will call you if the results come back abnormal. -Come back to clinic in 2 weeks to check your blood pressure and for some blood work.  For your lupus: -I sent a referral to rheumatologist.  Someone from their office will call you for the appointment.  Please come back to clinic in 2 weeks or earlier if your symptoms get worse or not improved. As always, if having severe symptoms, please seek medical attention at emergency room. Should you have any questions or concerns please call the internal medicine clinic at [phone].    Thank you!

## 2020-04-13 NOTE — Assessment & Plan Note (Signed)
Uncontrolled hypertension despite being on multiple agents: More than 10 mg daily, HCTZ 25 mg daily, lisinopril 40 mg daily, propranolol 40 mg twice daily which she takes for SVT.  Her prior metabolic panel did not show electrolyte abnormality.   -We will add spironolactone 25 mg daily with monitoring potassium -Continue amlodipine, HCTZ, lisinopril, propranolol -Renal artery ultrasound to evaluate for renovascular disease  -Follow-up in clinic in 2 weeks for blood pressure recheck and BMP

## 2020-04-13 NOTE — Progress Notes (Signed)
  Diabetes Self-Management Education  Visit Type: Follow-up  Appt. Start Time: 1445 Appt. End Time: 1517  04/13/2020  [name], identified by name and date of birth, is a 39 y.o. female with a diagnosis of Diabetes:  [name] Type 2  ASSESSMENT  Wt Readings from Last 10 Encounters:  04/13/20 (!) 360 lb 1.6 oz (163.3 kg)  07/16/19 (!) 360 lb 12.8 oz (163.7 kg)  06/25/19 (!) 369 lb 11.2 oz (167.7 kg)  06/11/19 (!) 365 lb 14.4 oz (166 kg)  06/10/19 (!) 363 lb 12.8 oz (165 kg)  05/14/19 (!) 363 lb 14.4 oz (165.1 kg)  05/07/19 (!) 365 lb 9.6 oz (165.8 kg)  04/23/19 (!) 367 lb 12.8 oz (166.8 kg)  04/09/19 (!) 374 lb 6.4 oz (169.8 kg)  03/26/19 (!) 370 lb 12.8 oz (168.2 kg)   Ms, Carpenter states she has changed her diet and lost weight so that her blood sugars are now in th 90s. But she also reports she does not check them often. She last took correction insulin a few days ago. Lab Results  Component Value Date   HGBA1C 9.3 (A) 04/13/2020   HGBA1C 11.0 (A) 06/11/2019   HGBA1C 8.7 (A) 03/26/2019   HGBA1C 7.8 (H) 08/30/2018   HGBA1C 6.6 08/13/2017    A sample Dexcom CGM was provided to her today. Her weight stable at ~ 10# less than 1 year ago.    Diabetes Self-Management Education - 04/13/20 1600      Visit Information   Visit Type Follow-up      Complications   Last HgB A1C per patient/outside source 9.3 %    How often do you check your blood sugar? 3-4 times / week    Fasting Blood glucose range (mg/dL) 63-875    Postprandial Blood glucose range (mg/dL) 643-329    Number of hypoglycemic episodes per month 3    Can you tell when your blood sugar is low? Yes    What do you do if your blood sugar is low? eats or drinks    Have you had a dilated eye exam in the past 12 months? Yes    Have you had a dental exam in the past 12 months? Yes    Are you checking your feet? Yes      Patient Education   Medications Reviewed patients medication for diabetes, action, purpose,  timing of dose and side effects.    Monitoring Other (comment)   cgm   Acute complications Taught treatment of hypoglycemia - the 15 rule.      Outcomes   Expected Outcomes Demonstrated interest in learning. Expect positive outcomes    Future DMSE 4-6 wks    Program Status Completed           Individualized Plan for Diabetes Self-Management Training:   Learning Objective:  Patient will have a greater understanding of diabetes self-management. Patient education plan is to attend individual and/or group sessions per assessed needs and concerns.   Plan:   There are no Patient Instructions on file for this visit.  Expected Outcomes:  Demonstrated interest in learning. Expect positive outcomes  Education material provided: Diabetes Resources  If problems or questions, patient to contact team via:  Phone and Email  Future DSME appointment:2- 4-6 wks  Norm Parcel, RD 04/13/2020 5:03 PM.

## 2020-04-13 NOTE — Progress Notes (Signed)
   CC: F/u of DM and HTN  HPI:  [name] is a 39 y.o. female with PMHx as documented below, presented for follow-up.  Today, we addressed her uncontrolled hypertension, uncontrolled diabetes, SLE. Please refer to problem based charting for further details and assessment and plan of current problem and chronic medical conditions.   Past Medical History:  Diagnosis Date  . Anemia   . Asthma   . Chlamydia 2012   treated  . Complication of anesthesia    " they have trouble waking me up"  . Diabetes mellitus    A2DM during pregnancy. now on metformin.   . Family history of anesthesia complication    mother & daughter   . GERD (gastroesophageal reflux disease)   . Gonorrhea 2010   treated  . Hypertension   . Lupus ([facility]) 10-2013  . Morbid obesity ([facility]) 07/22/2011  . Pregnancy induced hypertension    pre E with pregnancies  . Rheumatoid arthritis(714.0)   . Shortness of breath   . Sleep apnea    does not use cpap  . Stroke [facility]) 2012   March 2012>right side deficit (speech, upper/ower weakness)  . SVT (supraventricular tachycardia) ([facility])    Review of Systems: Negative as mentioned in HPI and assessment and plan.  Physical Exam:  Vitals:   04/13/20 1448  BP: (!) 188/118  Pulse: 84  Temp: 99.3 F (37.4 C)  SpO2: 100%  Weight: (!) 360 lb 1.6 oz (163.3 kg)  Height: 5\' 7"  (1.702 m)   Physical Exam Constitutional:      General: She is not in acute distress.    Appearance: She is obese. She is not ill-appearing.  HENT:     Head: Normocephalic and atraumatic.  Cardiovascular:     Rate and Rhythm: Normal rate and regular rhythm.     Pulses: Normal pulses.     Heart sounds: Normal heart sounds. No murmur heard.   Pulmonary:     Effort: Pulmonary effort is normal.     Breath sounds: Normal breath sounds. No wheezing or rales.  Abdominal:     General: There is no distension.     Palpations: Abdomen is soft.     Tenderness: There is no abdominal tenderness.    Musculoskeletal:     Right lower leg: No edema.     Left lower leg: No edema.  Skin:    General: Skin is warm and dry.  Psychiatric:        Mood and Affect: Mood normal.        Behavior: Behavior normal.        Thought Content: Thought content normal.      Assessment & Plan:   See Encounters Tab for problem based charting.  Patient discussed with Dr. 

## 2020-04-13 NOTE — Assessment & Plan Note (Addendum)
Hb A1c today is 9.3.  (Was 11, around 10 months ago).  She does not check her BG at home.  She is on Metformin 1000 mg BID, Lantus 25 mg daily, NovoLog SSI.  We started her on Ozempic but she stopped that after she heard from her ophthalmologist about she may have some degree of retinopathy.  -Switching Ozempic to Trulicity. Starting with 0.75 mg weekly for a month.  May go up to 1.5 weekly next time.  Provided sample for a month supply (2 boxes) -Check blood glucose at home -Continue Lantus 25 units daily, Metformin 1,000 mg twice daily and SSI NovoLog -Repeat A1c in 3 months -Follow-up with ophthalmologist -Ambulatory referral to podiatrist

## 2020-04-13 NOTE — Patient Instructions (Addendum)
Hi Maria Carpenter,   I hope you are able to get the Dexcom Continuous glucose monitor sample going. If not- you can call us and request prescriptions for the Dexcom reader to be sent to your pharmacy along with more sensors.   Keep up the hard work of making healthy choices and decreasing your portions and getting in as much physical activity as you can.  Decreasing calories can help take the weight off, physical activity helps keep it off.   Please call with questions or concerns. I suggest we follow up in 1-2 months.   [name] [phone]

## 2020-04-14 NOTE — Telephone Encounter (Signed)
I gave her a sample Dexcom G6 sensor and transmitter. I was going to wait to hear from her because her phone was not compatible to see if she wanted a reader  and if she likes it before asking for an order.

## 2020-04-15 ENCOUNTER — Other Ambulatory Visit: Payer: Self-pay | Admitting: Internal Medicine

## 2020-04-15 NOTE — Progress Notes (Signed)
Internal Medicine Clinic Attending  Case discussed with Dr. Masoudi  At the time of the visit.  We reviewed the resident's history and exam and pertinent patient test results.  I agree with the assessment, diagnosis, and plan of care documented in the resident's note.  

## 2020-04-18 ENCOUNTER — Other Ambulatory Visit: Payer: Self-pay | Admitting: Internal Medicine

## 2020-04-18 MED ORDER — DEXCOM G6 TRANSMITTER MISC: 3 refills | Status: DC

## 2020-04-18 MED ORDER — DEXCOM G6 SENSOR MISC: 11 refills | Status: DC

## 2020-04-18 MED ORDER — DEXCOM G6 RECEIVER DEVI: 0 refills | Status: DC

## 2020-04-18 NOTE — Telephone Encounter (Signed)
Maria Carpenter requests reader, sensors and transmitter prescriptions for Dexcom G6 Continuous glucose monitor be sent to [facility] [address]

## 2020-04-18 NOTE — Addendum Note (Signed)
Addended by: Baird Cancer on: 04/18/2020 02:16 PM   Modules accepted: Orders

## 2020-04-19 ENCOUNTER — Other Ambulatory Visit: Payer: Self-pay | Admitting: Urology

## 2020-04-19 ENCOUNTER — Other Ambulatory Visit: Payer: Self-pay | Admitting: Internal Medicine

## 2020-04-19 DIAGNOSIS — I10 Essential (primary) hypertension: Secondary | ICD-10-CM

## 2020-04-20 ENCOUNTER — Other Ambulatory Visit: Payer: Self-pay | Admitting: *Deleted

## 2020-04-20 ENCOUNTER — Telehealth: Payer: Self-pay | Admitting: *Deleted

## 2020-04-20 DIAGNOSIS — E1169 Type 2 diabetes mellitus with other specified complication: Secondary | ICD-10-CM

## 2020-04-20 MED ORDER — METFORMIN HCL 1000 MG PO TABS: 1000.0000 mg | ORAL_TABLET | Freq: Two times a day (BID) | ORAL | 3 refills | Status: DC

## 2020-04-20 NOTE — Telephone Encounter (Addendum)
Information was sent via CoverMyMeds to [employer] for PA for Dexcom 6 Transmitter, Sensor and Receiver. Awaiting determination.  Valentina Gu 04/20/2020 10:31 AM Additional information was faxed to [facility] for PA review.  Awaiting determination.  Angelina Ok, RN 04/21/2020 8:56 AM. Valinda Hoar from [facility] Dexcom G6 Sensor, Transmitter,  Receiver has been approved until 10/21/2020.  Angelina Ok, RN 04/25/2020 2:36 PM.

## 2020-04-27 ENCOUNTER — Ambulatory Visit (HOSPITAL_COMMUNITY)
Admission: RE | Admit: 2020-04-27 | Discharge: 2020-04-27 | Disposition: A | Discharge status: Home / Self Care | Payer: Medicaid Other | Source: Ambulatory Visit | Attending: Student in an Organized Health Care Education/Training Program | Admitting: Student in an Organized Health Care Education/Training Program

## 2020-04-27 DIAGNOSIS — I10 Essential (primary) hypertension: Secondary | ICD-10-CM

## 2020-05-05 ENCOUNTER — Ambulatory Visit: Payer: Medicaid Other | Admitting: Podiatry

## 2020-05-05 ENCOUNTER — Other Ambulatory Visit: Payer: Self-pay

## 2020-05-05 ENCOUNTER — Encounter: Payer: Self-pay | Admitting: Podiatry

## 2020-05-05 DIAGNOSIS — M2141 Flat foot [pes planus] (acquired), right foot: Secondary | ICD-10-CM

## 2020-05-05 DIAGNOSIS — L600 Ingrowing nail: Secondary | ICD-10-CM

## 2020-05-05 DIAGNOSIS — E1165 Type 2 diabetes mellitus with hyperglycemia: Secondary | ICD-10-CM

## 2020-05-05 DIAGNOSIS — M2041 Other hammer toe(s) (acquired), right foot: Secondary | ICD-10-CM | POA: Diagnosis not present

## 2020-05-05 DIAGNOSIS — M2042 Other hammer toe(s) (acquired), left foot: Secondary | ICD-10-CM

## 2020-05-05 DIAGNOSIS — E1142 Type 2 diabetes mellitus with diabetic polyneuropathy: Secondary | ICD-10-CM

## 2020-05-05 MED ORDER — NUVAIL EX SOLN: CUTANEOUS | 2 refills | Status: DC

## 2020-05-05 MED ORDER — GABAPENTIN 300 MG PO CAPS: 300.0000 mg | ORAL_CAPSULE | Freq: Every day | ORAL | 3 refills | Status: DC

## 2020-05-05 NOTE — Patient Instructions (Signed)
Diabetic Neuropathy Diabetic neuropathy refers to nerve damage that is caused by diabetes (diabetes mellitus). Over time, people with diabetes can develop nerve damage throughout the body. There are several types of diabetic neuropathy:  Peripheral neuropathy. This is the most common type of diabetic neuropathy. It causes damage to nerves that carry signals between the spinal cord and other parts of the body (peripheral nerves). This usually affects nerves in the feet and legs first, and may eventually affect the hands and arms. The damage affects the ability to sense touch or temperature.  Autonomic neuropathy. This type causes damage to nerves that control involuntary functions (autonomic nerves). These nerves carry signals that control: ? Heartbeat. ? Body temperature. ? Blood pressure. ? Urination. ? Digestion. ? Sweating. ? Sexual function. ? Response to changing blood sugar (glucose) levels.  Focal neuropathy. This type of nerve damage affects one area of the body, such as an arm, a leg, or the face. The injury may involve one nerve or a small group of nerves. Focal neuropathy can be painful and unpredictable, and occurs most often in older adults with diabetes. This often develops suddenly, but usually improves over time and does not cause long-term problems.  Proximal neuropathy. This type of nerve damage affects the nerves of the thighs, hips, buttocks, or legs. It causes severe pain, weakness, and muscle death (atrophy), usually in the thigh muscles. It is more common among older men and people who have type 2 diabetes. The length of recovery time may vary. What are the causes? Peripheral, autonomic, and focal neuropathies are caused by diabetes that is not well controlled with treatment. The cause of proximal neuropathy is not known, but it may be caused by inflammation related to uncontrolled blood glucose levels. What are the signs or symptoms? Peripheral neuropathy Peripheral  neuropathy develops slowly over time. When the nerves of the feet and legs no longer work, you may experience:  Burning, stabbing, or aching pain in the legs or feet.  Pain or cramping in the legs or feet.  Loss of feeling (numbness) and inability to feel pressure or pain in the feet. This can lead to: ? Thick calluses or sores on areas of constant pressure. ? Ulcers. ? Reduced ability to feel temperature changes.  Foot deformities.  Muscle weakness.  Loss of balance or coordination. Autonomic neuropathy The symptoms of autonomic neuropathy vary depending on which nerves are affected. Symptoms may include:  Problems with digestion, such as: ? Nausea or vomiting. ? Poor appetite. ? Bloating. ? Diarrhea or constipation. ? Trouble swallowing. ? Losing weight without trying to.  Problems with the heart, blood and lungs, such as: ? Dizziness, especially when standing up. ? Fainting. ? Shortness of breath. ? Irregular heartbeat.  Bladder problems, such as: ? Trouble starting or stopping urination. ? Leaking urine. ? Trouble emptying the bladder. ? Urinary tract infections (UTIs).  Problems with other body functions, such as: ? Sweat. You may sweat too much or too little. ? Temperature. You might get hot easily. Or, you might feel cold more than usual. ? Sexual function. Men may not be able to get or maintain an erection. Women may have vaginal dryness and difficulty with arousal. Focal neuropathy Symptoms affect only one area of the body. Common symptoms include:  Numbness.  Tingling.  Burning pain.  Prickling feeling.  Very sensitive skin.  Weakness.  Inability to move (paralysis).  Muscle twitching.  Muscles getting smaller (wasting).  Poor coordination.  Double or blurred vision. Proximal   neuropathy  Sudden, severe pain in the hip, thigh, or buttocks. Pain may spread from the back into the legs (sciatica).  Pain and numbness in the arms and  legs.  Tingling.  Loss of bladder control or bowel control.  Weakness and wasting of thigh muscles.  Difficulty getting up from a seated position.  Abdominal swelling.  Unexplained weight loss. How is this diagnosed? Diagnosis usually involves reviewing your medical history and any symptoms you have. Diagnosis varies depending on the type of neuropathy your health care provider suspects. Peripheral neuropathy Your health care provider will check areas that are affected by your nervous system (neurologic exam), such as your reflexes, how you move, and what you can feel. You may have other tests, such as:  Blood tests.  Removal and examination of fluid that surrounds the spinal cord (lumbar puncture).  CT scan.  MRI.  A test to check the nerves that control muscles (electromyogram, EMG).  Tests of how quickly messages pass through your nerves (nerve conduction velocity tests).  Removal of a small piece of nerve to be examined under a microscope (biopsy). Autonomic neuropathy You may have tests, such as:  Tests to measure your blood pressure and heart rate. This may include monitoring you while you are safely secured to an exam table that moves you from a lying position to an upright position (table tilt test).  Breathing tests to check your lungs.  Tests to check how food moves through the digestive system (gastric emptying tests).  Blood, sweat, or urine tests.  Ultrasound of your bladder.  Spinal fluid tests. Focal neuropathy This condition may be diagnosed with:  A neurologic exam.  CT scan.  MRI.  EMG.  Nerve conduction velocity tests. Proximal neuropathy There is no test to diagnose this type of neuropathy. You may have tests to rule out other possible causes of this type of neuropathy. Tests may include:  X-rays of your spine and lumbar region.  Lumbar puncture.  MRI. How is this treated? The goal of treatment is to keep nerve damage from getting  worse. The most important part of treatment is keeping your blood glucose level and your A1C level within your target range by following your diabetes management plan. Over time, maintaining lower blood glucose levels helps lessen symptoms. In some cases, you may need prescription pain medicine. Follow these instructions at home:  Lifestyle   Do not use any products that contain nicotine or tobacco, such as cigarettes and e-cigarettes. If you need help quitting, ask your health care provider.  Be physically active every day. Include strength training and balance exercises.  Follow a healthy meal plan.  Work with your health care provider to manage your blood pressure. General instructions  Follow your diabetes management plan as directed. ? Check your blood glucose levels as directed by your health care provider. ? Keep your blood glucose in your target range as directed by your health care provider. ? Have your A1C level checked at least two times a year, or as often as told by your health care provider.  Take over the counter and prescription medicines only as told by your health care provider. This includes insulin and diabetes medicine.  Do not drive or use heavy machinery while taking prescription pain medicines.  Check your skin and feet every day for cuts, bruises, redness, blisters, or sores.  Keep all follow up visits as told by your health care provider. This is important. Contact a health care provider if:  You have burning, stabbing, or aching pain in your legs or feet.  You are unable to feel pressure or pain in your feet.  You develop problems with digestion, such as: ? Nausea. ? Vomiting. ? Bloating. ? Constipation. ? Diarrhea. ? Abdominal pain.  You have difficulty with urination, such as inability: ? To control when you urinate (incontinence). ? To completely empty the bladder (retention).  You have palpitations.  You feel dizzy, weak, or faint when you  stand up. Get help right away if:  You cannot urinate.  You have sudden weakness or loss of coordination.  You have trouble speaking.  You have pain or pressure in your chest.  You have an irregular heart beat.  You have sudden inability to move a part of your body. Summary  Diabetic neuropathy refers to nerve damage that is caused by diabetes. It can affect nerves throughout the entire body, causing numbness and pain in the arms, legs, digestive tract, heart, and other body systems.  Keep your blood glucose level and your blood pressure in your target range, as directed by your health care provider. This can help prevent neuropathy from getting worse.  Check your skin and feet every day for cuts, bruises, redness, blisters, or sores.  Do not use any products that contain nicotine or tobacco, such as cigarettes and e-cigarettes. If you need help quitting, ask your health care provider. This information is not intended to replace advice given to you by your health care provider. Make sure you discuss any questions you have with your health care provider. Document Revised: 09/25/2017 Document Reviewed: 09/17/2016 Elsevier Patient Education  [place].  Diabetes Mellitus and [place] care is an important part of your health, especially when you have diabetes. Diabetes may cause you to have problems because of poor blood flow (circulation) to your feet and legs, which can cause your skin to:  Become thinner and drier.  Break more easily.  Heal more slowly.  Peel and crack. You may also have nerve damage (neuropathy) in your legs and feet, causing decreased feeling in them. This means that you may not notice minor injuries to your feet that could lead to more serious problems. Noticing and addressing any potential problems early is the best way to prevent future foot problems. How to care for your feet Foot hygiene  Wash your feet daily with warm water and mild  soap. Do not use hot water. Then, pat your feet and the areas between your toes until they are completely dry. Do not soak your feet as this can dry your skin.  Trim your toenails straight across. Do not dig under them or around the cuticle. File the edges of your nails with an emery board or nail file.  Apply a moisturizing lotion or petroleum jelly to the skin on your feet and to dry, brittle toenails. Use lotion that does not contain alcohol and is unscented. Do not apply lotion between your toes. Shoes and socks  Wear clean socks or stockings every day. Make sure they are not too tight. Do not wear knee-high stockings since they may decrease blood flow to your legs.  Wear shoes that fit properly and have enough cushioning. Always look in your shoes before you put them on to be sure there are no objects inside.  To break in new shoes, wear them for just a few hours a day. This prevents injuries on your feet. Wounds, scrapes, corns, and calluses  Check your  feet daily for blisters, cuts, bruises, sores, and redness. If you cannot see the bottom of your feet, use a mirror or ask someone for help.  Do not cut corns or calluses or try to remove them with medicine.  If you find a minor scrape, cut, or break in the skin on your feet, keep it and the skin around it clean and dry. You may clean these areas with mild soap and water. Do not clean the area with peroxide, alcohol, or iodine.  If you have a wound, scrape, corn, or callus on your foot, look at it several times a day to make sure it is healing and not infected. Check for: ? Redness, swelling, or pain. ? Fluid or blood. ? Warmth. ? Pus or a bad smell. General instructions  Do not cross your legs. This may decrease blood flow to your feet.  Do not use heating pads or hot water bottles on your feet. They may burn your skin. If you have lost feeling in your feet or legs, you may not know this is happening until it is too  late.  Protect your feet from hot and cold by wearing shoes, such as at the beach or on hot pavement.  Schedule a complete foot exam at least once a year (annually) or more often if you have foot problems. If you have foot problems, report any cuts, sores, or bruises to your health care provider immediately. Contact a health care provider if:  You have a medical condition that increases your risk of infection and you have any cuts, sores, or bruises on your feet.  You have an injury that is not healing.  You have redness on your legs or feet.  You feel burning or tingling in your legs or feet.  You have pain or cramps in your legs and feet.  Your legs or feet are numb.  Your feet always feel cold.  You have pain around a toenail. Get help right away if:  You have a wound, scrape, corn, or callus on your foot and: ? You have pain, swelling, or redness that gets worse. ? You have fluid or blood coming from the wound, scrape, corn, or callus. ? Your wound, scrape, corn, or callus feels warm to the touch. ? You have pus or a bad smell coming from the wound, scrape, corn, or callus. ? You have a fever. ? You have a red line going up your leg. Summary  Check your feet every day for cuts, sores, red spots, swelling, and blisters.  Moisturize feet and legs daily.  Wear shoes that fit properly and have enough cushioning.  If you have foot problems, report any cuts, sores, or bruises to your health care provider immediately.  Schedule a complete foot exam at least once a year (annually) or more often if you have foot problems. This information is not intended to replace advice given to you by your health care provider. Make sure you discuss any questions you have with your health care provider. Document Revised: 05/06/2019 Document Reviewed: 09/14/2016 Elsevier Patient Education  2020 [employer]  Diabetes Mellitus and Foot Care Foot care is an important part of your health,  especially when you have diabetes. Diabetes may cause you to have problems because of poor blood flow (circulation) to your feet and legs, which can cause your skin to:  Become thinner and drier.  Break more easily.  Heal more slowly.  Peel and crack. You may also have nerve  damage (neuropathy) in your legs and feet, causing decreased feeling in them. This means that you may not notice minor injuries to your feet that could lead to more serious problems. Noticing and addressing any potential problems early is the best way to prevent future foot problems. How to care for your feet Foot hygiene  Wash your feet daily with warm water and mild soap. Do not use hot water. Then, pat your feet and the areas between your toes until they are completely dry. Do not soak your feet as this can dry your skin.  Trim your toenails straight across. Do not dig under them or around the cuticle. File the edges of your nails with an emery board or nail file.  Apply a moisturizing lotion or petroleum jelly to the skin on your feet and to dry, brittle toenails. Use lotion that does not contain alcohol and is unscented. Do not apply lotion between your toes. Shoes and socks  Wear clean socks or stockings every day. Make sure they are not too tight. Do not wear knee-high stockings since they may decrease blood flow to your legs.  Wear shoes that fit properly and have enough cushioning. Always look in your shoes before you put them on to be sure there are no objects inside.  To break in new shoes, wear them for just a few hours a day. This prevents injuries on your feet. Wounds, scrapes, corns, and calluses  Check your feet daily for blisters, cuts, bruises, sores, and redness. If you cannot see the bottom of your feet, use a mirror or ask someone for help.  Do not cut corns or calluses or try to remove them with medicine.  If you find a minor scrape, cut, or break in the skin on your feet, keep it and the skin  around it clean and dry. You may clean these areas with mild soap and water. Do not clean the area with peroxide, alcohol, or iodine.  If you have a wound, scrape, corn, or callus on your foot, look at it several times a day to make sure it is healing and not infected. Check for: ? Redness, swelling, or pain. ? Fluid or blood. ? Warmth. ? Pus or a bad smell. General instructions  Do not cross your legs. This may decrease blood flow to your feet.  Do not use heating pads or hot water bottles on your feet. They may burn your skin. If you have lost feeling in your feet or legs, you may not know this is happening until it is too late.  Protect your feet from hot and cold by wearing shoes, such as at the beach or on hot pavement.  Schedule a complete foot exam at least once a year (annually) or more often if you have foot problems. If you have foot problems, report any cuts, sores, or bruises to your health care provider immediately. Contact a health care provider if:  You have a medical condition that increases your risk of infection and you have any cuts, sores, or bruises on your feet.  You have an injury that is not healing.  You have redness on your legs or feet.  You feel burning or tingling in your legs or feet.  You have pain or cramps in your legs and feet.  Your legs or feet are numb.  Your feet always feel cold.  You have pain around a toenail. Get help right away if:  You have a wound, scrape, corn, or  callus on your foot and: ? You have pain, swelling, or redness that gets worse. ? You have fluid or blood coming from the wound, scrape, corn, or callus. ? Your wound, scrape, corn, or callus feels warm to the touch. ? You have pus or a bad smell coming from the wound, scrape, corn, or callus. ? You have a fever. ? You have a red line going up your leg. Summary  Check your feet every day for cuts, sores, red spots, swelling, and blisters.  Moisturize feet and legs  daily.  Wear shoes that fit properly and have enough cushioning.  If you have foot problems, report any cuts, sores, or bruises to your health care provider immediately.  Schedule a complete foot exam at least once a year (annually) or more often if you have foot problems. This information is not intended to replace advice given to you by your health care provider. Make sure you discuss any questions you have with your health care provider. Document Revised: 05/06/2019 Document Reviewed: 09/14/2016 Elsevier Patient Education  2020 [employer].

## 2020-05-07 ENCOUNTER — Other Ambulatory Visit: Payer: Self-pay | Admitting: Internal Medicine

## 2020-05-07 DIAGNOSIS — K219 Gastro-esophageal reflux disease without esophagitis: Secondary | ICD-10-CM

## 2020-05-07 NOTE — Progress Notes (Signed)
  Subjective:  Patient ID: Maria Carpenter, female    DOB: [birth year],  MRN: [id number]  Chief Complaint  Patient presents with  . Nail Problem    Patient comes in today with referral from pcp for [facility], ingrown toenail left hallux medial border, thick nails hard to cut.  She is also c/o tingling sensations bilat feet, left worse than right, but comes and goes at random times    39 y.o. female presents with the above complaint. History confirmed with patient.  Last A1c 9.3%.  Her left hallux medial border has been ingrowing and is always bothersome for her.  Has not had a recent infection.  Objective:  Physical Exam: warm, good capillary refill, no trophic changes or ulcerative lesions, normal DP and PT pulses and absent protective sensation bilateral feet and subjective paresthesias.  Bilaterally she has pes planus and mild hammertoe contractures. Left Foot: Hallux nail medial border has proximal incurvation with pain on palpation.  No paronychia    Assessment:   1. Poorly controlled type 2 diabetes mellitus (HCC)   2. Ingrowing left great toenail   3. Pes planus of both feet   4. Diabetic polyneuropathy associated with type 2 diabetes mellitus (HCC)      Plan:  Patient was evaluated and treated and all questions answered.  Patient educated on diabetes. Discussed proper diabetic foot care and discussed risks and complications of disease. Educated patient in depth on reasons to return to the office immediately should he/she discover anything concerning or new on the feet. All questions answered. Discussed proper shoes as well.   Discussed the etiology and treatment options condition for diabetic polyneuropathy.  Discussed with her that some of the numbness may not improve and there is not a great treatment for this.  I prescribed gabapentin 300 mg nightly for her to see if this alleviates her painful tingling.  We will up titrate as needed.  Chief complaint of the ingrowing toenail  is bothersome but is currently not causing infection or at risk for wound formation.  I educated her that with her A1c as high as it is it might be worth waiting to see if she get her A1c down before performing any sort of matricectomy and avulsion.  She agreed with this plan.   Return in about 3 months (around 08/04/2020).

## 2020-05-11 ENCOUNTER — Other Ambulatory Visit: Payer: Self-pay

## 2020-05-11 ENCOUNTER — Ambulatory Visit (HOSPITAL_COMMUNITY)
Admission: RE | Admit: 2020-05-11 | Discharge: 2020-05-11 | Disposition: A | Discharge status: Home / Self Care | Payer: Medicaid Other | Source: Ambulatory Visit | Attending: Student in an Organized Health Care Education/Training Program | Admitting: Student in an Organized Health Care Education/Training Program

## 2020-05-11 DIAGNOSIS — I10 Essential (primary) hypertension: Secondary | ICD-10-CM | POA: Diagnosis not present

## 2020-05-11 NOTE — Progress Notes (Signed)
Renal artery duplex completed. Refer to "CV Proc" under chart review to view preliminary results.  05/11/2020 10:10 AM Eula Fried., MHA, RVT, RDCS, RDMS

## 2020-06-02 ENCOUNTER — Telehealth: Payer: Self-pay | Admitting: Internal Medicine

## 2020-06-02 DIAGNOSIS — I10 Essential (primary) hypertension: Secondary | ICD-10-CM

## 2020-06-03 NOTE — Telephone Encounter (Signed)
I will refill medication but looks like she was due to come back for repeat labs with last medication change. Can we be sure this gets scheduled?

## 2020-06-06 NOTE — Telephone Encounter (Signed)
Attempted to contact patient this morning to schedule future appointment no answer and voicemail is full.  Appointment scheduled with Dr. Maryla Morrow on 06/27/2020 at 2:45 pm.  Appointment card mailed.

## 2020-06-10 ENCOUNTER — Other Ambulatory Visit: Payer: Self-pay | Admitting: Internal Medicine

## 2020-06-10 DIAGNOSIS — J45909 Unspecified asthma, uncomplicated: Secondary | ICD-10-CM

## 2020-06-16 ENCOUNTER — Ambulatory Visit: Payer: Self-pay

## 2020-06-27 ENCOUNTER — Encounter: Payer: Self-pay | Admitting: Internal Medicine

## 2020-06-27 ENCOUNTER — Ambulatory Visit (INDEPENDENT_AMBULATORY_CARE_PROVIDER_SITE_OTHER): Payer: Medicaid Other | Admitting: Internal Medicine

## 2020-06-27 ENCOUNTER — Other Ambulatory Visit: Payer: Self-pay

## 2020-06-27 VITALS — BP 174/117 | HR 91 | Temp 98.4°F | Ht 66.0 in | Wt 359.9 lb

## 2020-06-27 DIAGNOSIS — F32A Depression, unspecified: Secondary | ICD-10-CM

## 2020-06-27 DIAGNOSIS — I10 Essential (primary) hypertension: Secondary | ICD-10-CM | POA: Diagnosis not present

## 2020-06-27 DIAGNOSIS — R4589 Other symptoms and signs involving emotional state: Secondary | ICD-10-CM

## 2020-06-27 DIAGNOSIS — M329 Systemic lupus erythematosus, unspecified: Secondary | ICD-10-CM

## 2020-06-27 DIAGNOSIS — E1169 Type 2 diabetes mellitus with other specified complication: Secondary | ICD-10-CM | POA: Diagnosis not present

## 2020-06-27 MED ORDER — LANTUS SOLOSTAR 100 UNIT/ML ~~LOC~~ SOPN: 23.0000 [IU] | PEN_INJECTOR | Freq: Every day | SUBCUTANEOUS | Status: DC

## 2020-06-27 NOTE — Patient Instructions (Signed)
Thank you for allowing [place] to provide your care today. Today we discussed your blood pressure, diabetes, depressed mood. Please decrease Lantus to 23 unit at bed time. Continue rest of your medications as before. F/u in clinic in 2-3 days. You need a close follow up.  If you developed any worsening of depressed mood or had any of the red flag we talked about, call 911 or our clinic.   Should you have any questions or concerns please call the internal medicine clinic at [phone].

## 2020-06-27 NOTE — Progress Notes (Signed)
Established Patient Office Visit  Subjective:  Patient ID: Maria Carpenter, female    DOB: 1981/07/04  Age: 39 y.o. MRN: [id number]  CC: Follow up of BP, DM Chief Complaint  Patient presents with  . Follow-up    to lab results     HPI Maria Carpenter presents for follow up of multiple issue. Unfortunately she has had multiple uncontrolled comorbidity. Today, she does not have her BP log with her. Her BP has been very elevated and she reports low and hight blood sugar all the timeUC for that. She mentions sometimes her family could not easily wake her when her blood sugar was very low. Unfortunately, she has not seek medical attention for that.  She mentions that she is anxious, and and blood glucose goes over the place. Please refer to problem based charting for further details and assessment and plan.  Patient Active Problem List   Diagnosis Date Noted  . Depression 06/29/2020  . Low back pain 06/10/2019  . Dyspnea 05/07/2019  . Hidradenitis suppurativa 04/23/2019  . Fatigue 04/11/2019  . Dysuria 04/11/2019  . Asthma   . Breast lump   . Influenza B 08/30/2018  . Abnormal uterine bleeding (AUB) 07/25/2017  . SVT (supraventricular tachycardia) ([place]) 06/23/2015  . Systemic lupus erythematosus ([place]) 10/25/2013  . TIA (transient ischemic attack) 07/02/2012  . Diabetes ([place]) 07/02/2012  . Hypertension 07/02/2012  . H/O: CVA (cerebrovascular accident) 07/02/2012  . GERD (gastroesophageal reflux disease) 07/02/2012  . Obstructive sleep apnea 07/02/2012  . CTS (carpal tunnel syndrome) 07/02/2012  . Morbid obesity ([place]) 07/22/2011  . Choledocholithiasis with acute cholecystitis 07/22/2011  . Stroke [facility]) 2012    Past Medical History:  Diagnosis Date  . Anemia   . Asthma   . Chlamydia 2012   treated  . Complication of anesthesia    " they have trouble waking me up"  . Diabetes mellitus    A2DM during pregnancy. now on metformin.   . Family history of anesthesia  complication    mother & daughter   . GERD (gastroesophageal reflux disease)   . Gonorrhea 2010   treated  . Hypertension   . Lupus ([place]) 10-2013  . Morbid obesity ([place]) 07/22/2011  . Pregnancy induced hypertension    pre E with pregnancies  . Rheumatoid arthritis(714.0)   . Shortness of breath   . Sleep apnea    does not use cpap  . Stroke [facility]) 2012   March 2012>right side deficit (speech, upper/ower weakness)  . SVT (supraventricular tachycardia) ([facility])     Past Surgical History:  Procedure Laterality Date  . CHOLECYSTECTOMY  07/22/2011   Procedure: LAPAROSCOPIC CHOLECYSTECTOMY WITH INTRAOPERATIVE CHOLANGIOGRAM;  Surgeon: Shann Medal, MD;  Location: [facility] ORS;  Service: General;  Laterality: N/A;  . ERCP  07/24/2011   Procedure: ENDOSCOPIC RETROGRADE CHOLANGIOPANCREATOGRAPHY (ERCP);  Surgeon: Beryle Beams;  Location: [facility] ENDOSCOPY;  Service: Endoscopy;  Laterality: N/A;  . FINGER SURGERY  1993   left ring finger to correct bone "problem"  . TUBAL LIGATION  2009    Family History  Problem Relation Age of Onset  . Anesthesia problems Mother        difficult to arouse, SOB  . Diabetes Mother   . Hypertension Mother   . Stroke Mother   . Diabetes Father   . Hypertension Father   . Heart disease Father   . Cancer Maternal Aunt   . Cancer Maternal Grandmother   . Breast cancer Maternal Grandmother   .  Breast cancer Cousin     Social History   Socioeconomic History  . Marital status: Married    Spouse name: Not on file  . Number of children: Not on file  . Years of education: Not on file  . Highest education level: Not on file  Occupational History  . Not on file  Tobacco Use  . Smoking status: Current Some Day Smoker    Packs/day: 0.25    Years: 4.00    Pack years: 1.00    Types: Cigarettes  . Smokeless tobacco: Never Used  . Tobacco comment: 4 cig/day  Vaping Use  . Vaping Use: Never used  Substance and Sexual Activity  . Alcohol use: Not Currently     Comment: socially  . Drug use: Not Currently    Types: Marijuana    Comment: last month  . Sexual activity: Yes    Birth control/protection: Other-see comments, Surgical  Other Topics Concern  . Not on file  Social History Narrative  . Not on file   Social Determinants of Health   Financial Resource Strain:   . Difficulty of Paying Living Expenses: Not on file  Food Insecurity:   . Worried About Charity fundraiser in the Last Year: Not on file  . Ran Out of Food in the Last Year: Not on file  Transportation Needs:   . Lack of Transportation (Medical): Not on file  . Lack of Transportation (Non-Medical): Not on file  Physical Activity:   . Days of Exercise per Week: Not on file  . Minutes of Exercise per Session: Not on file  Stress:   . Feeling of Stress : Not on file  Social Connections:   . Frequency of Communication with Friends and Family: Not on file  . Frequency of Social Gatherings with Friends and Family: Not on file  . Attends Religious Services: Not on file  . Active Member of Clubs or Organizations: Not on file  . Attends Archivist Meetings: Not on file  . Marital Status: Not on file  Intimate Partner Violence:   . Fear of Current or Ex-Partner: Not on file  . Emotionally Abused: Not on file  . Physically Abused: Not on file  . Sexually Abused: Not on file    Outpatient Medications Prior to Visit  Medication Sig Dispense Refill  . Accu-Chek FastClix Lancets MISC TEST UP TO 4 TIMES A DAY 102 each 3  . acetaminophen (TYLENOL) 325 MG tablet Take 2 tablets (650 mg total) by mouth every 6 (six) hours as needed for mild pain (or Fever >/= 101). 30 tablet 0  . amLODipine (NORVASC) 10 MG tablet TAKE 1 TABLET BY MOUTH EVERY DAY 90 tablet 1  . B-D UF III MINI PEN NEEDLES 31G X 5 MM MISC USE 5 TIMES A DAY AS DIRECTED 500 each 1  . blood glucose meter kit and supplies KIT Dispense based on patient and insurance preference. Use up to four times daily as  directed. (FOR ICD-9 250.00, 250.01). 1 each 0  . Cholecalciferol 25 MCG (1000 UT) capsule Take by mouth.    . clindamycin (CLEOCIN T) 1 % lotion Apply in am    . clindamycin (CLEOCIN) 300 MG capsule Take 300 mg by mouth 3 (three) times daily.    . clindamycin-benzoyl peroxide (BENZACLIN) gel Apply topically 2 (two) times daily. 25 g 0  . Continuous Blood Gluc Receiver (DEXCOM G6 RECEIVER) DEVI Use to check blood sugar at least 6 times a  day 1 each 0  . Continuous Blood Gluc Sensor (DEXCOM G6 SENSOR) MISC Check blood sugar at least 6 times a day 3 each 11  . Continuous Blood Gluc Transmit (DEXCOM G6 TRANSMITTER) MISC Use to check blood sugar at least 6 times a day 1 each 3  . Dermatological Products, Misc. (NUVAIL) SOLN Apply to affected nails daily 15 mL 2  . Dulaglutide (TRULICITY) 4.66 ZL/9.3TT SOPN Inject 0.5 mLs (0.75 mg total) into the skin once a week. 2 mL 0  . gabapentin (NEURONTIN) 300 MG capsule Take 1 capsule (300 mg total) by mouth at bedtime. 90 capsule 3  . glucose blood (ACCU-CHEK GUIDE) test strip Use to check blood sugar 4 times daily. DIAG CODE E11.9. INSULIN DEPENDENT 375 strip 1  . hydrochlorothiazide (HYDRODIURIL) 25 MG tablet TAKE 1 TABLET BY MOUTH EVERY DAY 30 tablet 2  . hyoscyamine (LEVSIN) 0.125 MG tablet TAKE 1 TABLET BY MOUTH 3 TIMES A DAY AS NEEDED 30 tablet 3  . ibuprofen (ADVIL,MOTRIN) 200 MG tablet Take 800 mg by mouth every 6 (six) hours as needed for moderate pain.    [name] insulin aspart (NOVOLOG) 100 UNIT/ML FlexPen Per correction scale (up to 8 unit three times a day) 15 mL 2  . Insulin Pen Needle (PEN NEEDLES) 32G X 5 MM MISC 1 each by Does not apply route 5 (five) times daily. 100 each 3  . lansoprazole (PREVACID) 15 MG capsule TAKE 1 CAPSULE (15 MG TOTAL) BY MOUTH DAILY AT 12 NOON. 60 capsule 2  . lisinopril (ZESTRIL) 40 MG tablet TAKE 1 TABLET BY MOUTH EVERY DAY 90 tablet 1  . loratadine (CLARITIN) 10 MG tablet Take 1 tablet (10 mg total) by mouth daily. 30  tablet 0  . metFORMIN (GLUCOPHAGE) 1000 MG tablet Take 1 tablet (1,000 mg total) by mouth 2 (two) times daily with a meal. 180 tablet 3  . Multiple Vitamin (MULTIVITAMIN WITH MINERALS) TABS tablet Take 1 tablet by mouth daily.    [name] PROAIR HFA 108 (90 Base) MCG/ACT inhaler INHALE 2 PUFFS INTO LUNGS EVERY 6 HOURS AS NEEDED FOR WHEEZING 8.5 each 3  . Probiotic Product (PROBIOTIC PO) Take 1 capsule by mouth daily.    . propranolol (INDERAL) 20 MG tablet Take 2 tablets (40 mg total) by mouth 2 (two) times daily. 120 tablet 3  . rifampin (RIFADIN) 300 MG capsule Take 300 mg by mouth 2 (two) times daily.    [name] spironolactone (ALDACTONE) 25 MG tablet Take 1 tablet (25 mg total) by mouth daily. 60 tablet 2  . Insulin Glargine (LANTUS SOLOSTAR) 100 UNIT/ML Solostar Pen Inject 25 Units into the skin daily at 10 pm. 5 pen 11   No facility-administered medications prior to visit.    Allergies  Allergen Reactions  . Amoxicillin Anaphylaxis and Rash    Has patient had a PCN reaction causing immediate rash, facial/tongue/throat swelling, SOB or lightheadedness with hypotension: Yes Has patient had a PCN reaction causing severe rash involving mucus membranes or skin necrosis: Yes Has patient had a PCN reaction that required hospitalization Yes Has patient had a PCN reaction occurring within the last 10 years: No If all of the above answers are "NO", then may proceed with Cephalosporin use.   . Dilaudid [Hydromorphone Hcl] Itching    ROS Review of Systems Negative except as mentioned in HPI and assessment and plan.   Objective:    Physical Exam Constitutional:      General: She is not in acute distress.  Appearance: She is obese. She is not ill-appearing.  HENT:     Head: Normocephalic and atraumatic.  Eyes:     Extraocular Movements: Extraocular movements intact.  Cardiovascular:     Rate and Rhythm: Normal rate and regular rhythm.     Heart sounds: No murmur heard.   Pulmonary:      Effort: Pulmonary effort is normal.     Breath sounds: Normal breath sounds. No wheezing or rales.  Musculoskeletal:     Right lower leg: No edema.     Left lower leg: No edema.  Skin:    General: Skin is warm and dry.  Neurological:     General: No focal deficit present.     Mental Status: She is oriented to person, place, and time.  Psychiatric:     Comments: Slightly anxious     BP (!) 174/117 (BP Location: Left Arm, Patient Position: Sitting, Cuff Size: Normal)   Pulse 91   Temp 98.4 F (36.9 C) (Oral)   Ht 5' 6" (1.676 m)   Wt (!) 359 lb 14.4 oz (163.2 kg)   SpO2 100%   BMI 58.09 kg/m  Wt Readings from Last 3 Encounters:  06/27/20 (!) 359 lb 14.4 oz (163.2 kg)  04/13/20 (!) 360 lb 1.6 oz (163.3 kg)  07/16/19 (!) 360 lb 12.8 oz (163.7 kg)     Health Maintenance Due  Topic Date Due  . Hepatitis C Screening  Never done  . COVID-19 Vaccine (1) Never done  . TETANUS/TDAP  Never done  . INFLUENZA VACCINE  03/27/2020  . OPHTHALMOLOGY EXAM  06/29/2020  . PAP SMEAR-Modifier  07/25/2020    There are no preventive care reminders to display for this patient.  Lab Results  Component Value Date   TSH 0.780 04/09/2019   Lab Results  Component Value Date   WBC 7.8 04/09/2019   HGB 11.9 04/09/2019   HCT 37.4 04/09/2019   MCV 87 04/09/2019   PLT 238 04/09/2019   Lab Results  Component Value Date   NA 137 03/26/2019   K 4.4 03/26/2019   CO2 24 03/26/2019   GLUCOSE 186 (H) 03/26/2019   BUN 9 03/26/2019   CREATININE 0.68 03/26/2019   BILITOT 0.3 03/26/2019   ALKPHOS 103 03/26/2019   AST 15 03/26/2019   ALT 16 03/26/2019   PROT 7.1 03/26/2019   ALBUMIN 4.2 03/26/2019   CALCIUM 9.4 03/26/2019   ANIONGAP 10 08/31/2018   Lab Results  Component Value Date   CHOL 148 07/11/2015   Lab Results  Component Value Date   HDL 31 (L) 07/11/2015   Lab Results  Component Value Date   LDLCALC 91 07/11/2015   Lab Results  Component Value Date   TRIG 132 07/11/2015     Lab Results  Component Value Date   CHOLHDL 4.8 07/11/2015   Lab Results  Component Value Date   HGBA1C 9.3 (A) 04/13/2020      Assessment & Plan:   Problem List Items Addressed This Visit      Cardiovascular and Mediastinum   Hypertension - Primary (Chronic)    Uncontrolled, resistant HTN: Blood pressure 174/117 today. No symptoms. Unfortunately patient did not follow-up in clinic as recommended. She reports that her blood pressure has been elevated at home sometimes SBP around 200. We discussed the importance of controlling her blood pressure. We started her on spironolactone 25 mg daily last visit. She has resistant hypertension, given being on 4 agents: Amlodipine, HCTZ, lisinopril, propranolol.  Renal ultrasound showed unilateral 60% stenosis of renal artery. Reviewed obesity and anxiety playing role as well.  -We'll continue current medications and will bring BP cuff next visit in 2 days. -Patient is a scheduled to come back to clinic in 2 days. We'll recheck blood pressure, will obtain BMP and will consider changing HCTZ to chlorthalidone. Clonidine can also be considered next step. However hesitate to do that until make sure patient has compliance. If no success in BP control, and given complicated uncontrolled comorbidities, will take benefit of referral to cardiology or nephrology for more advice regarding BP control.       Relevant Orders   BMP8+Anion Gap     Endocrine   Diabetes ([place])    -Stoped Ozempic and start Trulicity 3.42 mg subcutaneous once a week last visit.  (provided 1 month supply sample for Trulicity. But she had diarrhea with that and she stopped it after after 2-3 weeks. -Also on Metformin 1000 mg BID, NovoLog 8-12 unit before meal, Lantus 25 u  -Unfortunately she has multiple uncontrolled comorbidities, anxiety and depressed mood and is not able to follow-up closely as expected. Not sure about adherence. -Was seen by podiatrist for foot  care  -HbA1c was elevated at 9.3, 2 months ago (04/13/2020), from 11, a year ago.  -Home BG record shows 94% in range, less than 1% low, 5% high 0% very high average glucose 133. Although CGM does not show significant hypoglycemia or hyper ischemia, patient reports several episodes of symptomatic hypoglycemia and then mentioned that her BG sometimes  Huge spike then low sugar. -For now, given episodes of hypoglycemia, (at morning and evening. No clear pattern to determine her with no insulin changes) I decreased her Lantus from 25 to 23 units at bedtime. -Continue Metformin and NovoLog 8 to 12 units before meals. Instructed to hold short acting insulin if hyperglycemic. -Hemoglobin A1c next visit       Relevant Medications   insulin glargine (LANTUS SOLOSTAR) 100 UNIT/ML Solostar Pen     Other   Depression    Depressed mood and anxiety: Prior suicial ideation. No current suicidal ideation or plan. (she reported on PSQ-9 questionnaire that she has the thoughts that she would be better off dead or hurting herself almost every day but specifically mentions that she does not have actual suicidal ideation or plan. Depressed mood and anxcious. She is taking care of her granddaughter and it makes her more depressed. She mentions that her family is what keep her away from suicide. She feels stable. She does paint and it helps her. She listen to happy music. No active suical plan or ideation.   -Ambulatory referral to behavioral health and psych -Educated patient that she should call 911 or inform us if it's within working hours if she developed any sudden suicidal ideation. She verbalizes understanding and mentions that she feels a stable and has been able to keep her mood up. She is stable to go home tonight but I would like to see her more frequent in our clinic she agrees with plan. She is a scheduled to come back in 2 days.        Systemic lupus erythematosus ([place])    Patient is her appointment  with rheumatology after we referred her last visit. She is going to call their office to reschedule.       Other Visit Diagnoses    Depressed mood       Relevant Orders   Ambulatory referral to Psychiatry  Meds ordered this encounter  Medications  . insulin glargine (LANTUS SOLOSTAR) 100 UNIT/ML Solostar Pen    Sig: Inject 23 Units into the skin daily at 10 pm.    Follow-up: No follow-ups on file.    Dewayne Hatch, MD

## 2020-06-29 ENCOUNTER — Other Ambulatory Visit: Payer: Self-pay

## 2020-06-29 ENCOUNTER — Ambulatory Visit (INDEPENDENT_AMBULATORY_CARE_PROVIDER_SITE_OTHER): Payer: Medicaid Other | Admitting: Internal Medicine

## 2020-06-29 ENCOUNTER — Encounter: Payer: Self-pay | Admitting: Internal Medicine

## 2020-06-29 VITALS — BP 174/98 | HR 90 | Temp 98.3°F | Ht 66.5 in | Wt 360.4 lb

## 2020-06-29 DIAGNOSIS — I10 Essential (primary) hypertension: Secondary | ICD-10-CM | POA: Diagnosis not present

## 2020-06-29 DIAGNOSIS — G4733 Obstructive sleep apnea (adult) (pediatric): Secondary | ICD-10-CM

## 2020-06-29 DIAGNOSIS — F32A Depression, unspecified: Secondary | ICD-10-CM | POA: Insufficient documentation

## 2020-06-29 DIAGNOSIS — L608 Other nail disorders: Secondary | ICD-10-CM | POA: Diagnosis not present

## 2020-06-29 DIAGNOSIS — I701 Atherosclerosis of renal artery: Secondary | ICD-10-CM

## 2020-06-29 DIAGNOSIS — E1169 Type 2 diabetes mellitus with other specified complication: Secondary | ICD-10-CM

## 2020-06-29 DIAGNOSIS — E559 Vitamin D deficiency, unspecified: Secondary | ICD-10-CM

## 2020-06-29 LAB — BASIC METABOLIC PANEL
Anion gap: 7 (ref 5–15)
BUN: 11 mg/dL (ref 6–20)
CO2: 26 mmol/L (ref 22–32)
Calcium: 9 mg/dL (ref 8.9–10.3)
Chloride: 104 mmol/L (ref 98–111)
Creatinine, Ser: 0.65 mg/dL (ref 0.44–1.00)
GFR, Estimated: >60 mL/min (ref >60)
Glucose, Bld: 169 mg/dL — ABNORMAL HIGH (ref 70–99)
Potassium: 4 mmol/L (ref 3.5–5.1)
Sodium: 137 mmol/L (ref 135–145)

## 2020-06-29 MED ORDER — CHOLECALCIFEROL 25 MCG (1000 UT) PO CAPS: 2000.0000 [IU] | ORAL_CAPSULE | Freq: Every day | ORAL | 0 refills | Status: DC

## 2020-06-29 NOTE — Assessment & Plan Note (Signed)
Unilateral renal artery stenosis. > 60% left renal artery stenosis on renal artery [place] Last kidney function was normal. BMP today is pending. Likely not indicated and no major benefit of intervention for BP control. Plan is to optimal medical management with RAS blocking agent ACE/ARB. Also adding ASA and statin for CV risk reduction this or next visit.

## 2020-06-29 NOTE — Addendum Note (Signed)
Addended byChevis Pretty on: 06/29/2020 03:34 PM   Modules accepted: Orders

## 2020-06-29 NOTE — Assessment & Plan Note (Signed)
This is a new problem.  Pt recently noticed nail discoloration. No pain. No Hx of trauma. Should r/u melanoma and need evaluation by dermatologist.  -Needs dermatology evaluation. Instructed to get an appointment from dermatologist who she already follows for her skin mole.

## 2020-06-29 NOTE — Progress Notes (Signed)
   CC: HTN follow up  HPI:  [name] is a 39 y.o. female with PMHx as documented below, presented for f/u. We address DM, HTN, nail discoloration, depression and Vit D deficiency today. Please refer to problem based charting for further details and assessment and plan of current problem and chronic medical conditions.  Past Medical History:  Diagnosis Date  . Anemia   . Asthma   . Chlamydia 2012   treated  . Complication of anesthesia    " they have trouble waking me up"  . Diabetes mellitus    A2DM during pregnancy. now on metformin.   . Family history of anesthesia complication    mother & daughter   . GERD (gastroesophageal reflux disease)   . Gonorrhea 2010   treated  . Hypertension   . Lupus ([facility]) 10-2013  . Morbid obesity ([facility]) 07/22/2011  . Pregnancy induced hypertension    pre E with pregnancies  . Rheumatoid arthritis(714.0)   . Shortness of breath   . Sleep apnea    does not use cpap  . Stroke [facility]) 2012   March 2012>right side deficit (speech, upper/ower weakness)  . SVT (supraventricular tachycardia) ([facility])    Review of Systems:  Negative except as mentioned in HPI and assessment and plan.  Physical Exam:  Vitals:   06/29/20 1353  BP: (!) 153/80  Pulse: 90  Temp: 98.3 F (36.8 C)  TempSrc: Oral  SpO2: 100%  Weight: (!) 360 lb 6.4 oz (163.5 kg)   Constitutional: Morbidly obese, No acute distress.  HENT:  Head: Normocephalic and atraumatic.  Eyes: Conjunctivae are normal, EOM nl Cardiovascular: RRR, nl S1S2, 2/6 systolic murmur, no LEE Respiratory: Effort normal and breath sounds normal. No respiratory distress. No wheezes.  GB:TDVV. Bowel sounds are normal. No distension. There is no tenderness.  Neurological: Is alert and oriented x 3  Skin: Not diaphoretic. No erythema. Pigmentation of nail  Psychiatric: Slightly anxcious. Behavior is normal. Judgment and thought content normal.     Assessment & Plan:   Should you have any  questions or concerns please call the internal medicine clinic at [phone].    See Encounters Tab for problem based charting.  Patient discussed with Dr. Mikey Bussing

## 2020-06-29 NOTE — Assessment & Plan Note (Signed)
Vit D deficiency:  Vit D 25 Oh was 16.9 on 04/08/2020 and Vit D supplement dose increased to 2000 U/d. -Checking Vit D level today

## 2020-06-29 NOTE — Assessment & Plan Note (Signed)
Uncontrolled IDDM2: On Lantus (decreased to 23 u, two days ago given episode of hypoglycemia), Novolog 8-12 u w meal, Metformin. Did not tolerate Trulicity and stoped ozempic before w concen of retinopathy).  Lowest BG in past 2 days and after decreasing lantus to 23 u was 70 and it happened when pt missed a meal. 94% in range. 5% high and <1% low. Average BG 133. Educated about eating frequent small snack. -Continue current regimen -HbA1c in 2 weeks -Will consider SGLT 2 inhibitor (pt prefers to hold off of it for now) -On ASA -Lipid panel today for ASCVD risk score and to start on guideline base dose of statin

## 2020-06-29 NOTE — Assessment & Plan Note (Signed)
Sleep study ordered before but pt was not able to get that due to sickness in her family. Explained the importance of diagnosis and management of probable OSA, given uncontrolled DM. She mentions that she will get the test done.

## 2020-06-29 NOTE — Patient Instructions (Signed)
Thank you for allowing [place] to provide your care today.  1)Your blood pressure today is 153/80 which is elevated but improved comparing to last visit which was 174/117. As we talked, stress can cause elevated blood pressure. Per our shared decision, we keep you on current medications and reevaluate you in 2 weeks. I sent you to lab for blood work today. Until then, continue taking your medications as before and regularly, I will call you if we need to adjust your medications based on lab result.  2)Uncontrolled sleep apnea can cause elevated blood pressure that is sometimes difficult to control with medication.  We have ordered sleep study for you to evaluate for any sleep apnea.  Please make sure to get the sleep study done.  3)For your diabetes, continue your current regimen.  Follow-up in our clinic in 2 weeks. 4)I refer you to, behavioral health/psychologist in our clinic for depression and anxiety management.  I am glad that you think your mood is stable but as we discussed, if you develop any suicidal ideation, call 911.  You can always reach [place] for a nonemergent situation. 5)Make sure to get an appointment with dermatologist for your nail. 6) I check your vit D level and cholesterol level today too and will call you if abnomral. Today we made no changes to your medications.    Please follow-up in clinic in 2 weeks.

## 2020-06-29 NOTE — Assessment & Plan Note (Signed)
Patient is her appointment with rheumatology after we referred her last visit. She is going to call their office to reschedule.

## 2020-06-29 NOTE — Assessment & Plan Note (Addendum)
Depressed mood and anxiety: Prior suicial ideation. No current suicidal ideation or plan. (she reported on PSQ-9 questionnaire that she has the thoughts that she would be better off dead or hurting herself almost every day but specifically mentions that she does not have actual suicidal ideation or plan. Depressed mood and anxcious. She is taking care of her granddaughter and it makes her more depressed. She mentions that her family is what keep her away from suicide. She feels stable. She does paint and it helps her. She listen to happy music. No active suical plan or ideation.   -Ambulatory referral to behavioral health and psych -Educated patient that she should call 911 or inform us if it's within working hours if she developed any sudden suicidal ideation. She verbalizes understanding and mentions that she feels a stable and has been able to keep her mood up. She is stable to go home tonight but I would like to see her more frequent in our clinic she agrees with plan. She is a scheduled to come back in 2 days.

## 2020-06-29 NOTE — Assessment & Plan Note (Addendum)
Uncontrolled, resistant HTN: Blood pressure 174/117 today. No symptoms. Unfortunately patient did not follow-up in clinic as recommended. She reports that her blood pressure has been elevated at home sometimes SBP around 200. We discussed the importance of controlling her blood pressure. We started her on spironolactone 25 mg daily last visit. She has resistant hypertension, given being on 4 agents: Amlodipine, HCTZ, lisinopril, propranolol. Renal ultrasound showed unilateral stenosis of renal artery:> 60% stenosis of L renal artery Reviewed obesity and anxiety playing role as well.  -We'll continue current medications and will bring BP cuff next visit in 2 days. -Patient is a scheduled to come back to clinic in 2 days. We'll recheck blood pressure, will obtain BMP and will consider changing HCTZ to chlorthalidone. Clonidine can also be considered next step. However hesitate to do that until make sure patient has compliance. If no success in BP control, and given complicated uncontrolled comorbidities, will take benefit of referral to cardiology or nephrology for more advice regarding BP control.

## 2020-06-29 NOTE — Assessment & Plan Note (Signed)
-  Stoped Ozempic and start Trulicity 0.75 mg subcutaneous once a week last visit.  (provided 1 month supply sample for Trulicity. But she had diarrhea with that and she stopped it after after 2-3 weeks. -Also on Metformin 1000 mg BID, NovoLog 8-12 unit before meal, Lantus 25 u  -Unfortunately she has multiple uncontrolled comorbidities, anxiety and depressed mood and is not able to follow-up closely as expected. Not sure about adherence. -Was seen by podiatrist for foot care  -HbA1c was elevated at 9.3, 2 months ago (04/13/2020), from 11, a year ago.  -Home BG record shows 94% in range, less than 1% low, 5% high 0% very high average glucose 133. Although CGM does not show significant hypoglycemia or hyper ischemia, patient reports several episodes of symptomatic hypoglycemia and then mentioned that her BG sometimes  Huge spike then low sugar. -For now, given episodes of hypoglycemia, (at morning and evening. No clear pattern to determine her with no insulin changes) I decreased her Lantus from 25 to 23 units at bedtime. -Continue Metformin and NovoLog 8 to 12 units before meals. Instructed to hold short acting insulin if hyperglycemic. -Hemoglobin A1c next visit

## 2020-06-29 NOTE — Assessment & Plan Note (Addendum)
Uncontrolled resistant HTN: The patient's blood pressure during this visit was 153/80. The patient is currently taking amlodipine 10 mg daily, HCTZ 25 mg daily, lisinopril 40 mg daily, spironolactone 25 mg daily, propranolol 40 mg twice daily. Her last blood pressure visits were:  BP Readings from Last 3 Encounters:  06/29/20 (!) 153/80  06/27/20 (!) 174/117  04/13/20 (!) 188/118   She has morbid obesity, probable OSA.  (Sleep study ordered in 2020 but was not performed.) that can contribute to her uncontrolled HTN. Last TSH nl. Renal artery [place]: LRV flow present. 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   -BMP today -Continue current medications, will adjust as needed after BMP result. Instructed to take meds regularly.  -Come back to clinic again in 2 weeks. -Discussed importance of diagnosis and managing probable OSA as uncontrolled sleep apnea can contribute with uncontrolled blood pressure -Patient reports that exercise and weight loss has been a challenge because she feels her blood pressure goes up and blood sugar drops if she exercises. -If remaines uncontrolled, will consider switching HCTZ to chlorthalidone.  -encourage to get sleep study done  ADDENDUM: BMP unremarkable. Pt notified. Continue current plan.

## 2020-06-29 NOTE — Assessment & Plan Note (Signed)
Mood is much better today. She is refered to [facility] and psych. No suicidal ideation or plan.

## 2020-06-30 ENCOUNTER — Other Ambulatory Visit: Payer: Self-pay | Admitting: Internal Medicine

## 2020-06-30 DIAGNOSIS — E1169 Type 2 diabetes mellitus with other specified complication: Secondary | ICD-10-CM

## 2020-06-30 LAB — LIPID PANEL
Chol/HDL Ratio: 3.8 ratio (ref 0.0–4.4)
Cholesterol, Total: 155 mg/dL (ref 100–199)
HDL: 41 mg/dL (ref >39)
LDL Chol Calc (NIH): 100 mg/dL — ABNORMAL HIGH (ref 0–99)
Triglycerides: 71 mg/dL (ref 0–149)
VLDL Cholesterol Cal: 14 mg/dL (ref 5–40)

## 2020-06-30 MED ORDER — ROSUVASTATIN CALCIUM 10 MG PO TABS: 10.0000 mg | ORAL_TABLET | Freq: Every day | ORAL | 1 refills | Status: DC

## 2020-06-30 NOTE — Addendum Note (Signed)
Addended by: Neomia Dear on: 06/30/2020 05:38 PM   Modules accepted: Orders

## 2020-06-30 NOTE — Progress Notes (Signed)
Lipid Panel resulted. Given Hx of DM, starting on moderate intensity statin. Sending script for Rosuvastatin 10 mg QD. Informed patient.     Component Value Date/Time   CHOL 155 06/29/2020 1350   TRIG 71 06/29/2020 1350   HDL 41 06/29/2020 1350   CHOLHDL 3.8 06/29/2020 1350   CHOLHDL 4.8 07/11/2015 1106   VLDL 26 07/11/2015 1106   LDLCALC 100 (H) 06/29/2020 1350   LABVLDL 14 06/29/2020 1350

## 2020-07-01 NOTE — Progress Notes (Signed)
Internal Medicine Clinic Attending  Case discussed with Dr. Maryla Morrow  At the time of the visit.  We reviewed the resident's history and exam and pertinent patient test results.  I agree with the assessment, diagnosis, and plan of care documented in the resident's note. 25 vit d will likely be enough to check for monitoring of her vit d deficency

## 2020-07-04 NOTE — Progress Notes (Signed)
Internal Medicine Clinic Attending  Case discussed with Dr. Masoudi  At the time of the visit.  We reviewed the resident's history and exam and pertinent patient test results.  I agree with the assessment, diagnosis, and plan of care documented in the resident's note.  

## 2020-07-08 ENCOUNTER — Other Ambulatory Visit: Payer: Self-pay | Admitting: Internal Medicine

## 2020-07-08 DIAGNOSIS — E1169 Type 2 diabetes mellitus with other specified complication: Secondary | ICD-10-CM

## 2020-07-08 LAB — VITAMIN D 1,25 DIHYDROXY
Vitamin D 1, 25 (OH)2 Total: 98 pg/mL — ABNORMAL HIGH
Vitamin D2 1, 25 (OH)2: <10 pg/mL
Vitamin D3 1, 25 (OH)2: 98 pg/mL

## 2020-07-11 ENCOUNTER — Other Ambulatory Visit: Payer: Self-pay | Admitting: *Deleted

## 2020-07-11 MED ORDER — AMLODIPINE BESYLATE 10 MG PO TABS
10.0000 mg | ORAL_TABLET | Freq: Every day | ORAL | 1 refills | Status: DC
Start: 2020-07-11 — End: 2020-11-30

## 2020-07-12 ENCOUNTER — Encounter: Payer: Medicaid Other | Admitting: Behavioral Health

## 2020-07-12 ENCOUNTER — Institutional Professional Consult (permissible substitution): Payer: Medicaid Other | Admitting: Behavioral Health

## 2020-07-13 NOTE — Addendum Note (Signed)
Addended by: Neomia Dear on: 07/13/2020 06:53 PM   Modules accepted: Orders

## 2020-07-14 ENCOUNTER — Telehealth: Payer: Self-pay | Admitting: Behavioral Health

## 2020-07-15 ENCOUNTER — Ambulatory Visit (INDEPENDENT_AMBULATORY_CARE_PROVIDER_SITE_OTHER): Payer: Medicaid Other | Admitting: Internal Medicine

## 2020-07-15 ENCOUNTER — Encounter: Payer: Self-pay | Admitting: Internal Medicine

## 2020-07-15 ENCOUNTER — Other Ambulatory Visit: Payer: Self-pay

## 2020-07-15 VITALS — BP 154/96 | HR 89 | Temp 98.7°F | Ht 67.0 in | Wt 362.7 lb

## 2020-07-15 DIAGNOSIS — I10 Essential (primary) hypertension: Secondary | ICD-10-CM | POA: Diagnosis not present

## 2020-07-15 DIAGNOSIS — G4733 Obstructive sleep apnea (adult) (pediatric): Secondary | ICD-10-CM | POA: Diagnosis not present

## 2020-07-15 MED ORDER — CHLORTHALIDONE 50 MG PO TABS: 50.0000 mg | ORAL_TABLET | Freq: Every day | ORAL | 1 refills | Status: DC

## 2020-07-15 NOTE — Progress Notes (Signed)
Established Patient Office Visit  Subjective:  Patient ID: Maria Carpenter, female    DOB: May 17, 1981  Age: 39 y.o. MRN: [id number]  CC: F/u of HTN  HPI Maria Carpenter presents for f/u of HTN. Please see problem based charting for further details and assessment and plan.  Past Medical History:  Diagnosis Date  . Anemia   . Asthma   . Chlamydia 2012   treated  . Complication of anesthesia    " they have trouble waking me up"  . Diabetes mellitus    A2DM during pregnancy. now on metformin.   . Family history of anesthesia complication    mother & daughter   . GERD (gastroesophageal reflux disease)   . Gonorrhea 2010   treated  . Hypertension   . Lupus ([place]) 10-2013  . Morbid obesity ([place]) 07/22/2011  . Pregnancy induced hypertension    pre E with pregnancies  . Rheumatoid arthritis(714.0)   . Shortness of breath   . Sleep apnea    does not use cpap  . Stroke [facility]) 2012   March 2012>right side deficit (speech, upper/ower weakness)  . SVT (supraventricular tachycardia) ([facility])     Past Surgical History:  Procedure Laterality Date  . CHOLECYSTECTOMY  07/22/2011   Procedure: LAPAROSCOPIC CHOLECYSTECTOMY WITH INTRAOPERATIVE CHOLANGIOGRAM;  Surgeon: Shann Medal, MD;  Location: [facility] ORS;  Service: General;  Laterality: N/A;  . ERCP  07/24/2011   Procedure: ENDOSCOPIC RETROGRADE CHOLANGIOPANCREATOGRAPHY (ERCP);  Surgeon: Beryle Beams;  Location: [facility] ENDOSCOPY;  Service: Endoscopy;  Laterality: N/A;  . FINGER SURGERY  1993   left ring finger to correct bone "problem"  . TUBAL LIGATION  2009    Family History  Problem Relation Age of Onset  . Anesthesia problems Mother        difficult to arouse, SOB  . Diabetes Mother   . Hypertension Mother   . Stroke Mother   . Diabetes Father   . Hypertension Father   . Heart disease Father   . Cancer Maternal Aunt   . Cancer Maternal Grandmother   . Breast cancer Maternal Grandmother   . Breast cancer Cousin      Social History   Socioeconomic History  . Marital status: Married    Spouse name: Not on file  . Number of children: Not on file  . Years of education: Not on file  . Highest education level: Not on file  Occupational History  . Not on file  Tobacco Use  . Smoking status: Current Some Day Smoker    Packs/day: 0.25    Years: 4.00    Pack years: 1.00    Types: Cigarettes  . Smokeless tobacco: Never Used  . Tobacco comment: 6-7 cig/day  Vaping Use  . Vaping Use: Never used  Substance and Sexual Activity  . Alcohol use: Not Currently    Comment: socially  . Drug use: Not Currently    Types: Marijuana    Comment: last month  . Sexual activity: Yes    Birth control/protection: Other-see comments, Surgical  Other Topics Concern  . Not on file  Social History Narrative  . Not on file   Social Determinants of Health   Financial Resource Strain:   . Difficulty of Paying Living Expenses: Not on file  Food Insecurity:   . Worried About Charity fundraiser in the Last Year: Not on file  . Ran Out of Food in the Last Year: Not on file  Transportation Needs:   .  Lack of Transportation (Medical): Not on file  . Lack of Transportation (Non-Medical): Not on file  Physical Activity:   . Days of Exercise per Week: Not on file  . Minutes of Exercise per Session: Not on file  Stress:   . Feeling of Stress : Not on file  Social Connections:   . Frequency of Communication with Friends and Family: Not on file  . Frequency of Social Gatherings with Friends and Family: Not on file  . Attends Religious Services: Not on file  . Active Member of Clubs or Organizations: Not on file  . Attends Archivist Meetings: Not on file  . Marital Status: Not on file  Intimate Partner Violence:   . Fear of Current or Ex-Partner: Not on file  . Emotionally Abused: Not on file  . Physically Abused: Not on file  . Sexually Abused: Not on file    Outpatient Medications Prior to Visit   Medication Sig Dispense Refill  . Accu-Chek FastClix Lancets MISC TEST UP TO 4 TIMES A DAY 102 each 3  . acetaminophen (TYLENOL) 325 MG tablet Take 2 tablets (650 mg total) by mouth every 6 (six) hours as needed for mild pain (or Fever >/= 101). 30 tablet 0  . amLODipine (NORVASC) 10 MG tablet Take 1 tablet (10 mg total) by mouth daily. 90 tablet 1  . B-D UF III MINI PEN NEEDLES 31G X 5 MM MISC USE 5 TIMES A DAY AS DIRECTED 500 each 1  . blood glucose meter kit and supplies KIT Dispense based on patient and insurance preference. Use up to four times daily as directed. (FOR ICD-9 250.00, 250.01). 1 each 0  . Cholecalciferol 25 MCG (1000 UT) capsule Take 2 capsules (2,000 Units total) by mouth daily. 60 capsule 0  . clindamycin (CLEOCIN T) 1 % lotion Apply in am    . clindamycin (CLEOCIN) 300 MG capsule Take 300 mg by mouth 3 (three) times daily.    . clindamycin-benzoyl peroxide (BENZACLIN) gel Apply topically 2 (two) times daily. 25 g 0  . Continuous Blood Gluc Receiver (DEXCOM G6 RECEIVER) DEVI Use to check blood sugar at least 6 times a day 1 each 0  . Continuous Blood Gluc Sensor (DEXCOM G6 SENSOR) MISC Check blood sugar at least 6 times a day 3 each 11  . Continuous Blood Gluc Transmit (DEXCOM G6 TRANSMITTER) MISC Use to check blood sugar at least 6 times a day 1 each 3  . Dermatological Products, Misc. (NUVAIL) SOLN Apply to affected nails daily 15 mL 2  . gabapentin (NEURONTIN) 300 MG capsule Take 1 capsule (300 mg total) by mouth at bedtime. 90 capsule 3  . glucose blood (ACCU-CHEK GUIDE) test strip Use to check blood sugar 4 times daily. DIAG CODE E11.9. INSULIN DEPENDENT 375 strip 1  . hyoscyamine (LEVSIN) 0.125 MG tablet TAKE 1 TABLET BY MOUTH 3 TIMES A DAY AS NEEDED 30 tablet 3  . ibuprofen (ADVIL,MOTRIN) 200 MG tablet Take 800 mg by mouth every 6 (six) hours as needed for moderate pain.    [name] insulin glargine (LANTUS SOLOSTAR) 100 UNIT/ML Solostar Pen Inject 23 Units into the skin  daily at 10 pm.    . Insulin Pen Needle (PEN NEEDLES) 32G X 5 MM MISC 1 each by Does not apply route 5 (five) times daily. 100 each 3  . lansoprazole (PREVACID) 15 MG capsule TAKE 1 CAPSULE (15 MG TOTAL) BY MOUTH DAILY AT 12 NOON. 60 capsule 2  . lisinopril (  ZESTRIL) 40 MG tablet TAKE 1 TABLET BY MOUTH EVERY DAY 90 tablet 1  . loratadine (CLARITIN) 10 MG tablet Take 1 tablet (10 mg total) by mouth daily. 30 tablet 0  . metFORMIN (GLUCOPHAGE) 1000 MG tablet Take 1 tablet (1,000 mg total) by mouth 2 (two) times daily with a meal. 180 tablet 3  . Multiple Vitamin (MULTIVITAMIN WITH MINERALS) TABS tablet Take 1 tablet by mouth daily.    [name] NOVOLOG FLEXPEN 100 UNIT/ML FlexPen PER CORRECTION SCALE (UP TO 8 UNIT THREE TIMES A DAY) 15 mL 2  . PROAIR HFA 108 (90 Base) MCG/ACT inhaler INHALE 2 PUFFS INTO LUNGS EVERY 6 HOURS AS NEEDED FOR WHEEZING 8.5 each 3  . Probiotic Product (PROBIOTIC PO) Take 1 capsule by mouth daily.    . propranolol (INDERAL) 20 MG tablet Take 2 tablets (40 mg total) by mouth 2 (two) times daily. 120 tablet 3  . rifampin (RIFADIN) 300 MG capsule Take 300 mg by mouth 2 (two) times daily.    . rosuvastatin (CRESTOR) 10 MG tablet Take 1 tablet (10 mg total) by mouth daily. 90 tablet 1  . spironolactone (ALDACTONE) 25 MG tablet Take 1 tablet (25 mg total) by mouth daily. 60 tablet 2  . hydrochlorothiazide (HYDRODIURIL) 25 MG tablet TAKE 1 TABLET BY MOUTH EVERY DAY 30 tablet 2   No facility-administered medications prior to visit.    Allergies  Allergen Reactions  . Amoxicillin Anaphylaxis and Rash    Has patient had a PCN reaction causing immediate rash, facial/tongue/throat swelling, SOB or lightheadedness with hypotension: Yes Has patient had a PCN reaction causing severe rash involving mucus membranes or skin necrosis: Yes Has patient had a PCN reaction that required hospitalization Yes Has patient had a PCN reaction occurring within the last 10 years: No If all of the above  answers are "NO", then may proceed with Cephalosporin use.   . Dilaudid [Hydromorphone Hcl] Itching    ROS Review of Systems Constitutional: Negative for chills and fever.  Respiratory: Negative for shortness of breath.   Cardiovascular: Negative for chest pain and leg swelling.  Gastrointestinal: Negative for abdominal pain, nausea and vomiting.  Neurological: Negative for dizziness and headaches.     Objective:    Physical Exam  BP (!) 154/96 (BP Location: Right Arm, Patient Position: Sitting, Cuff Size: Large)   Pulse 89   Temp 98.7 F (37.1 C) (Oral)   Ht _0  (1.702 m)   Wt (!) 362 lb 11.2 oz (164.5 kg)   LMP 06/18/2020   SpO2 100%   BMI 56.81 kg/m  Wt Readings from Last 3 Encounters:  07/15/20 (!) 362 lb 11.2 oz (164.5 kg)  06/29/20 (!) 360 lb 6.4 oz (163.5 kg)  06/27/20 (!) 359 lb 14.4 oz (163.2 kg)   Constitutional: Obese lady, No acute distress.  Head: Normocephalic and atraumatic.  Cardiovascular:  RRR, nl S1S2, no murmur,  no LEE Respiratory: Effort normal and breath sounds normal. No respiratory distress. No wheezes.  GI: Soft. No distension. There is no tenderness. No CVA tenderness Neurological: Is alert and oriented x 3  Skin: Not diaphoretic. No erythema.  Psychiatric:  Normal mood and affect. Behavior is normal. Judgment and thought content normal.   Health Maintenance Due  Topic Date Due  . Hepatitis C Screening  Never done  . COVID-19 Vaccine (1) Never done  . TETANUS/TDAP  Never done  . INFLUENZA VACCINE  03/27/2020  . OPHTHALMOLOGY EXAM  06/29/2020  . PAP SMEAR-Modifier  07/25/2020    There are no preventive care reminders to display for this patient.  Lab Results  Component Value Date   TSH 0.780 04/09/2019   Lab Results  Component Value Date   WBC 7.8 04/09/2019   HGB 11.9 04/09/2019   HCT 37.4 04/09/2019   MCV 87 04/09/2019   PLT 238 04/09/2019   Lab Results  Component Value Date   NA 137 06/29/2020   K 4.0 06/29/2020   CO2  26 06/29/2020   GLUCOSE 169 (H) 06/29/2020   BUN 11 06/29/2020   CREATININE 0.65 06/29/2020   BILITOT 0.3 03/26/2019   ALKPHOS 103 03/26/2019   AST 15 03/26/2019   ALT 16 03/26/2019   PROT 7.1 03/26/2019   ALBUMIN 4.2 03/26/2019   CALCIUM 9.0 06/29/2020   ANIONGAP 7 06/29/2020   Lab Results  Component Value Date   CHOL 155 06/29/2020   Lab Results  Component Value Date   HDL 41 06/29/2020   Lab Results  Component Value Date   LDLCALC 100 (H) 06/29/2020   Lab Results  Component Value Date   TRIG 71 06/29/2020   Lab Results  Component Value Date   CHOLHDL 3.8 06/29/2020   Lab Results  Component Value Date   HGBA1C 9.3 (A) 04/13/2020      Assessment & Plan:   Problem List Items Addressed This Visit      Cardiovascular and Mediastinum   Hypertension (Chronic)    Uncontrolled persistent HTN: The patient is currently taking amlodipine 10 mg daily, HCTZ 25 mg daily, lisinopril 40 mg daily, spironolactone 25 mg daily, propranolol 40 mg twice daily.   She has morbid obesity, probable OSA that can contribute to her uncontrolled HTN. Last TSH nl. Renal artery [place]: LRV flow present. 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  I don't think intervention will have any affect on BP control.  Will switch HCTZ to chlorthalidone. Will order home sleep study (cant leave home for entire night because she is taking care of her granddaughter)   -DC HCTZ -Start Chlorthalidone 50 mg QD -Continue Amlodipine, spironolactone, Lisinopril, propranolol -Last BMP 2 w ago was unremarkable -f/u in clinic in 1-2 weeks       Relevant Medications   chlorthalidone (HYGROTON) 50 MG tablet     Respiratory   Obstructive sleep apnea - Primary (Chronic)    Patient was not able to go to her appointment for sleep study. She mentions that she can not leave the house for the whole night because she cant leave her granddaughter at home. She is agreeable to have the study done at her  home. -Placed order for home sleep test      Relevant Orders   Home sleep test   Home sleep test      Meds ordered this encounter  Medications  . chlorthalidone (HYGROTON) 50 MG tablet    Sig: Take 1 tablet (50 mg total) by mouth daily.    Dispense:  30 tablet    Refill:  1    Follow-up: Return in about 2 weeks (around 07/29/2020) for DM and HTn f/u.    Dewayne Hatch, MD

## 2020-07-15 NOTE — Assessment & Plan Note (Signed)
Uncontrolled persistent HTN: The patient is currently taking amlodipine 10 mg daily, HCTZ 25 mg daily, lisinopril 40 mg daily, spironolactone 25 mg daily, propranolol 40 mg twice daily.   She has morbid obesity, probable OSA that can contribute to her uncontrolled HTN. Last TSH nl. Renal artery [place]: LRV flow present. 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  I don't think intervention will have any affect on BP control.  Will switch HCTZ to chlorthalidone. Will order home sleep study (cant leave home for entire night because she is taking care of her granddaughter)   -DC HCTZ -Start Chlorthalidone 50 mg QD -Continue Amlodipine, spironolactone, Lisinopril, propranolol -Last BMP 2 w ago was unremarkable -f/u in clinic in 1-2 weeks

## 2020-07-15 NOTE — Assessment & Plan Note (Signed)
Patient was not able to go to her appointment for sleep study. She mentions that she can not leave the house for the whole night because she cant leave her granddaughter at home. She is agreeable to have the study done at her home. -Placed order for home sleep test

## 2020-07-15 NOTE — Patient Instructions (Signed)
Thank you for allowing [place] to provide your care today. Today we discussed your hypertension.  1-Stop HCTZ (hydrochlorthiazide) 2- Start Chlorthalidone 50 mg tablet once a day 3-Take rest of your medications as before 4-Follow up in clinic in 2 weeks for diabetes and blood pressure follow up or sooner as needed.  Should you have any questions or concerns please call the internal medicine clinic at [phone].

## 2020-07-18 NOTE — Progress Notes (Signed)
Internal Medicine Clinic Attending  Case discussed with Dr. Masoudi  At the time of the visit.  We reviewed the resident's history and exam and pertinent patient test results.  I agree with the assessment, diagnosis, and plan of care documented in the resident's note.  

## 2020-07-20 ENCOUNTER — Institutional Professional Consult (permissible substitution): Payer: Medicaid Other | Admitting: Behavioral Health

## 2020-07-27 ENCOUNTER — Other Ambulatory Visit: Payer: Self-pay

## 2020-07-27 ENCOUNTER — Ambulatory Visit: Payer: Medicaid Other | Admitting: Behavioral Health

## 2020-07-27 DIAGNOSIS — F32A Depression, unspecified: Secondary | ICD-10-CM

## 2020-07-27 NOTE — BH Specialist Note (Signed)
Integrated Behavioral Health via Telemedicine Visit  07/27/2020 TRUTH WOLAVER [id number]  Number of Integrated Behavioral Health visits: 2 Session Start time: 11:00am  Session End time: 11:30am Total time: 30  Referring Provider: [facility] Resident Patient/Family location: Pt is in her car travelling to pick up her Son at [facility]. He is not feeling well. [facility] Provider location: [facility] Office All persons participating in visit: Clinician & Pt Types of Service: Individual psychotherapy  I connected with Aldean Baker and/or Dyke Brackett self by Telephone and verified that I am speaking with the correct person using two identifiers.    Discussed confidentiality: Yes   I discussed the limitations of telemedicine and the availability of in person appointments.  Discussed there is a possibility of technology failure and discussed alternative modes of communication if that failure occurs.  I discussed that engaging in this telemedicine visit, they consent to the provision of behavioral healthcare and the services will be billed under their insurance.  Patient and/or legal guardian expressed understanding and consented to Telemedicine visit: Yes   Presenting Concerns: Patient and/or family reports the following symptoms/concerns: Pt is concerned for 15yo Son who just broke up w/his GF. This is his first exp having a broken heart. The family cares for him & his adjustment to this situation.  Duration of problem: Just since last evening; Severity of problem: moderate  Patient and/or Family's Strengths/Protective Factors: Caregiver has knowledge of parenting & child development, Parental Resilience and Son is a typical young teen who holds feelings inside. Sometimes it all comes out & he will explode & cry. Pt is well aware of her Son's situation & is relieved. Dad is also. Sisters have been understanding. Pt expressing worry over Son's possibly having SI since 2 of her Dtrs have exp'd this  in the past.   Goals Addressed: Patient will: 1.  Reduce symptoms of: anxiety and stress  2.  Increase knowledge and/or ability of: healthy habits, stress reduction and psychoedu for suicide  3.  Demonstrate ability to: Increase healthy adjustment to current life circumstances and encourage Pt in her caregiving for her Son.  Progress towards Goals: Ongoing  Interventions: Interventions utilized:  Solution-Focused Strategies, Supportive Counseling and Supportive Reflection Standardized Assessments completed: Not Needed  Patient and/or Family Response: Pt is responsive to discussion & feels validated regarding her caregiving for family as a whole. Pt is willing to speak again next week.   Assessment: Patient currently experiencing stress & worry for oldest teen Son.   Patient may benefit from Referral for a Therapist for her Son.  Plan: 1. Follow up with behavioral health clinician on : Wed, Dec 8th @ 10am 2. Behavioral recommendations: Give self time for self-care practices. Focus on one thing at a time. Do not take on more caregiving duties voluntarily. 3. Referral(s): Psychological Evaluation/Testing and Pt wishes for appropriate Referrals to be sent through MyChart Portal.  I discussed the assessment and treatment plan with the patient and/or parent/guardian. They were provided an opportunity to ask questions and all were answered. They agreed with the plan and demonstrated an understanding of the instructions.   They were advised to call back or seek an in-person evaluation if the symptoms worsen or if the condition fails to improve as anticipated.  Deneise Lever, LMFT

## 2020-08-01 DIAGNOSIS — L810 Postinflammatory hyperpigmentation: Secondary | ICD-10-CM | POA: Diagnosis not present

## 2020-08-01 DIAGNOSIS — L910 Hypertrophic scar: Secondary | ICD-10-CM | POA: Diagnosis not present

## 2020-08-01 DIAGNOSIS — L608 Other nail disorders: Secondary | ICD-10-CM | POA: Diagnosis not present

## 2020-08-01 DIAGNOSIS — L709 Acne, unspecified: Secondary | ICD-10-CM | POA: Diagnosis not present

## 2020-08-01 DIAGNOSIS — M329 Systemic lupus erythematosus, unspecified: Secondary | ICD-10-CM | POA: Diagnosis not present

## 2020-08-01 DIAGNOSIS — L732 Hidradenitis suppurativa: Secondary | ICD-10-CM | POA: Diagnosis not present

## 2020-08-04 ENCOUNTER — Ambulatory Visit: Payer: Medicaid Other | Admitting: Podiatry

## 2020-08-08 ENCOUNTER — Other Ambulatory Visit: Payer: Self-pay | Admitting: Internal Medicine

## 2020-08-08 DIAGNOSIS — E1169 Type 2 diabetes mellitus with other specified complication: Secondary | ICD-10-CM

## 2020-08-08 NOTE — Telephone Encounter (Signed)
Please have pt arrange an appointment for diabetes follow up in the next 30d. Thank you

## 2020-08-16 ENCOUNTER — Encounter: Payer: Self-pay | Admitting: Emergency Medicine

## 2020-08-16 ENCOUNTER — Ambulatory Visit
Admission: EM | Admit: 2020-08-16 | Discharge: 2020-08-16 | Disposition: A | Discharge status: Home / Self Care | Payer: Medicaid Other | Attending: Family Medicine | Admitting: Family Medicine

## 2020-08-16 ENCOUNTER — Ambulatory Visit (INDEPENDENT_AMBULATORY_CARE_PROVIDER_SITE_OTHER): Payer: Medicaid Other

## 2020-08-16 ENCOUNTER — Other Ambulatory Visit: Payer: Self-pay

## 2020-08-16 DIAGNOSIS — R509 Fever, unspecified: Secondary | ICD-10-CM

## 2020-08-16 DIAGNOSIS — R059 Cough, unspecified: Secondary | ICD-10-CM

## 2020-08-16 DIAGNOSIS — R5383 Other fatigue: Secondary | ICD-10-CM

## 2020-08-16 DIAGNOSIS — R519 Headache, unspecified: Secondary | ICD-10-CM

## 2020-08-16 DIAGNOSIS — R0602 Shortness of breath: Secondary | ICD-10-CM

## 2020-08-16 DIAGNOSIS — J209 Acute bronchitis, unspecified: Secondary | ICD-10-CM

## 2020-08-16 MED ORDER — PREDNISONE 20 MG PO TABS: 20.0000 mg | ORAL_TABLET | Freq: Every day | ORAL | 0 refills | Status: DC

## 2020-08-16 MED ORDER — PROMETHAZINE-DM 6.25-15 MG/5ML PO SYRP: 5.0000 mL | ORAL_SOLUTION | Freq: Four times a day (QID) | ORAL | 0 refills | Status: DC | PRN

## 2020-08-16 NOTE — Discharge Instructions (Signed)
I have sent in prednisone for you to take once a day for 5 days  I have sent in promethazine cough syrup for you to use as needed for cough. This medication can make you sleepy, do not drive while taking this medication  Your COVID and Flu tests are pending.  You should self quarantine until the test results are back.    Take Tylenol or ibuprofen as needed for fever or discomfort.  Rest and keep yourself hydrated.    Follow-up with your primary care provider if your symptoms are not improving.

## 2020-08-16 NOTE — ED Provider Notes (Signed)
[place]   [id number] 08/16/20 Arrival Time: 1910   CC: COVID symptoms  SUBJECTIVE: History from: patient.  Maria Carpenter is a 39 y.o. female who presents with abrupt onset of nasal congestion, PND, fatigue, SOB, and persistent dry cough for the last 4 days. Reports that she lives in a household of 10 and that there are a couple of people in the house with a cough. Denies recent travel. Has negative history of Covid. Has hx lupus. Has not completed Covid vaccines.  Has used nebulizer at home today with some benefit. SOB and cough are aggravated with activity. Denies previous symptoms in the past. Denies fever, sinus pain, rhinorrhea, sore throat, nausea, changes in bowel or bladder habits.    ROS: As per HPI.  All other pertinent ROS negative.     Past Medical History:  Diagnosis Date  . Anemia   . Asthma   . Chlamydia 2012   treated  . Complication of anesthesia    " they have trouble waking me up"  . Diabetes mellitus    A2DM during pregnancy. now on metformin.   . Family history of anesthesia complication    mother & daughter   . GERD (gastroesophageal reflux disease)   . Gonorrhea 2010   treated  . Hypertension   . Lupus ([place]) 10-2013  . Morbid obesity ([place]) 07/22/2011  . Pregnancy induced hypertension    pre E with pregnancies  . Rheumatoid arthritis(714.0)   . Shortness of breath   . Sleep apnea    does not use cpap  . Stroke [facility]) 2012   March 2012>right side deficit (speech, upper/ower weakness)  . SVT (supraventricular tachycardia) (HCC)    Past Surgical History:  Procedure Laterality Date  . CHOLECYSTECTOMY  07/22/2011   Procedure: LAPAROSCOPIC CHOLECYSTECTOMY WITH INTRAOPERATIVE CHOLANGIOGRAM;  Surgeon: Shann Medal, MD;  Location: [facility] ORS;  Service: General;  Laterality: N/A;  . ERCP  07/24/2011   Procedure: ENDOSCOPIC RETROGRADE CHOLANGIOPANCREATOGRAPHY (ERCP);  Surgeon: Beryle Beams;  Location: [facility] ENDOSCOPY;  Service: Endoscopy;   Laterality: N/A;  . FINGER SURGERY  1993   left ring finger to correct bone "problem"  . TUBAL LIGATION  2009   Allergies  Allergen Reactions  . Amoxicillin Anaphylaxis and Rash    Has patient had a PCN reaction causing immediate rash, facial/tongue/throat swelling, SOB or lightheadedness with hypotension: Yes Has patient had a PCN reaction causing severe rash involving mucus membranes or skin necrosis: Yes Has patient had a PCN reaction that required hospitalization Yes Has patient had a PCN reaction occurring within the last 10 years: No If all of the above answers are "NO", then may proceed with Cephalosporin use.   . Dilaudid [Hydromorphone Hcl] Itching  . Eggs Or Egg-Derived Products Nausea And Vomiting and Rash   No current facility-administered medications on file prior to encounter.   Current Outpatient Medications on File Prior to Encounter  Medication Sig Dispense Refill  . Accu-Chek FastClix Lancets MISC TEST UP TO 4 TIMES A DAY 102 each 3  . acetaminophen (TYLENOL) 325 MG tablet Take 2 tablets (650 mg total) by mouth every 6 (six) hours as needed for mild pain (or Fever >/= 101). 30 tablet 0  . amLODipine (NORVASC) 10 MG tablet Take 1 tablet (10 mg total) by mouth daily. 90 tablet 1  . B-D UF III MINI PEN NEEDLES 31G X 5 MM MISC USE 5 TIMES A DAY AS DIRECTED 500 each 1  . blood glucose meter  kit and supplies KIT Dispense based on patient and insurance preference. Use up to four times daily as directed. (FOR ICD-9 250.00, 250.01). 1 each 0  . chlorthalidone (HYGROTON) 50 MG tablet Take 1 tablet (50 mg total) by mouth daily. 30 tablet 1  . Cholecalciferol 25 MCG (1000 UT) capsule Take 2 capsules (2,000 Units total) by mouth daily. 60 capsule 0  . clindamycin (CLEOCIN T) 1 % lotion Apply in am    . clindamycin (CLEOCIN) 300 MG capsule Take 300 mg by mouth 3 (three) times daily.    . clindamycin-benzoyl peroxide (BENZACLIN) gel Apply topically 2 (two) times daily. 25 g 0  .  Continuous Blood Gluc Receiver (DEXCOM G6 RECEIVER) DEVI Use to check blood sugar at least 6 times a day 1 each 0  . Continuous Blood Gluc Sensor (DEXCOM G6 SENSOR) MISC Check blood sugar at least 6 times a day 3 each 11  . Continuous Blood Gluc Transmit (DEXCOM G6 TRANSMITTER) MISC Use to check blood sugar at least 6 times a day 1 each 3  . Dermatological Products, Misc. (NUVAIL) SOLN Apply to affected nails daily 15 mL 2  . gabapentin (NEURONTIN) 300 MG capsule Take 1 capsule (300 mg total) by mouth at bedtime. 90 capsule 3  . glucose blood (ACCU-CHEK GUIDE) test strip Use to check blood sugar 4 times daily. DIAG CODE E11.9. INSULIN DEPENDENT 375 strip 1  . hyoscyamine (LEVSIN) 0.125 MG tablet TAKE 1 TABLET BY MOUTH 3 TIMES A DAY AS NEEDED 30 tablet 3  . ibuprofen (ADVIL,MOTRIN) 200 MG tablet Take 800 mg by mouth every 6 (six) hours as needed for moderate pain.    . Insulin Pen Needle (PEN NEEDLES) 32G X 5 MM MISC 1 each by Does not apply route 5 (five) times daily. 100 each 3  . lansoprazole (PREVACID) 15 MG capsule TAKE 1 CAPSULE (15 MG TOTAL) BY MOUTH DAILY AT 12 NOON. 60 capsule 2  . LANTUS SOLOSTAR 100 UNIT/ML Solostar Pen INJECT 30 UNITS INTO THE SKIN DAILY AT 10 PM. 15 mL 0  . lisinopril (ZESTRIL) 40 MG tablet TAKE 1 TABLET BY MOUTH EVERY DAY 90 tablet 1  . loratadine (CLARITIN) 10 MG tablet Take 1 tablet (10 mg total) by mouth daily. 30 tablet 0  . metFORMIN (GLUCOPHAGE) 1000 MG tablet Take 1 tablet (1,000 mg total) by mouth 2 (two) times daily with a meal. 180 tablet 3  . Multiple Vitamin (MULTIVITAMIN WITH MINERALS) TABS tablet Take 1 tablet by mouth daily.    [name] NOVOLOG FLEXPEN 100 UNIT/ML FlexPen PER CORRECTION SCALE (UP TO 8 UNIT THREE TIMES A DAY) 15 mL 2  . PROAIR HFA 108 (90 Base) MCG/ACT inhaler INHALE 2 PUFFS INTO LUNGS EVERY 6 HOURS AS NEEDED FOR WHEEZING 8.5 each 3  . Probiotic Product (PROBIOTIC PO) Take 1 capsule by mouth daily.    . propranolol (INDERAL) 20 MG tablet Take 2  tablets (40 mg total) by mouth 2 (two) times daily. 120 tablet 3  . rifampin (RIFADIN) 300 MG capsule Take 300 mg by mouth 2 (two) times daily.    . rosuvastatin (CRESTOR) 10 MG tablet Take 1 tablet (10 mg total) by mouth daily. 90 tablet 1  . spironolactone (ALDACTONE) 25 MG tablet Take 1 tablet (25 mg total) by mouth daily. 60 tablet 2  . [DISCONTINUED] budesonide (PULMICORT) 180 MCG/ACT inhaler Inhale 1 puff into the lungs 2 (two) times daily. 1 each 1   Social History   Socioeconomic History  .  Marital status: Married    Spouse name: Not on file  . Number of children: Not on file  . Years of education: Not on file  . Highest education level: Not on file  Occupational History  . Not on file  Tobacco Use  . Smoking status: Current Some Day Smoker    Packs/day: 0.25    Years: 4.00    Pack years: 1.00    Types: Cigarettes  . Smokeless tobacco: Never Used  . Tobacco comment: 6-7 cig/day  Vaping Use  . Vaping Use: Never used  Substance and Sexual Activity  . Alcohol use: Not Currently    Comment: socially  . Drug use: Not Currently    Types: Marijuana    Comment: last month  . Sexual activity: Yes    Birth control/protection: Other-see comments, Surgical  Other Topics Concern  . Not on file  Social History Narrative  . Not on file   Social Determinants of Health   Financial Resource Strain: Not on file  Food Insecurity: Not on file  Transportation Needs: Not on file  Physical Activity: Not on file  Stress: Not on file  Social Connections: Not on file  Intimate Partner Violence: Not on file   Family History  Problem Relation Age of Onset  . Anesthesia problems Mother        difficult to arouse, SOB  . Diabetes Mother   . Hypertension Mother   . Stroke Mother   . Diabetes Father   . Hypertension Father   . Heart disease Father   . Cancer Maternal Aunt   . Cancer Maternal Grandmother   . Breast cancer Maternal Grandmother   . Breast cancer Cousin      OBJECTIVE:  Vitals:   08/16/20 1926  BP: (!) 168/122  Pulse: 90  Resp: 19  Temp: 98.3 F (36.8 C)  TempSrc: Oral  SpO2: 97%     General appearance: alert; appears fatigued, but nontoxic; speaking in full sentences and tolerating own secretions HEENT: NCAT; Ears: EACs clear, TMs pearly gray; Eyes: PERRL.  EOM grossly intact. Sinuses: nontender; Nose: nares patent without rhinorrhea, Throat: oropharynx mildly erythematous, cobblestoning present, tonsils non erythematous or enlarged, uvula midline  Neck: supple with LAD Lungs: unlabored respirations, symmetrical air entry; cough: moderate; no respiratory distress; diffuse wheezing noted throughout bilateral lung fields, diminished lung sounds to bilateral bases Heart: regular rate and rhythm.  Radial pulses 2+ symmetrical bilaterally Skin: warm and dry Psychological: alert and cooperative; normal mood and affect  LABS:  No results found for this or any previous visit (from the past 24 hour(s)).   ASSESSMENT & PLAN:  1. Acute bronchitis, unspecified organism   2. Cough   3. Fever, unspecified fever cause   4. Other fatigue   5. SOB (shortness of breath)   6. Nonintractable headache, unspecified chronicity pattern, unspecified headache type     Meds ordered this encounter  Medications  . promethazine-dextromethorphan (PROMETHAZINE-DM) 6.25-15 MG/5ML syrup    Sig: Take 5 mLs by mouth 4 (four) times daily as needed for cough.    Dispense:  118 mL    Refill:  0    Order Specific Question:   Supervising Provider    Answer:   Chase Picket [id number]  . predniSONE (DELTASONE) 20 MG tablet    Sig: Take 1 tablet (20 mg total) by mouth daily with breakfast.    Dispense:  5 tablet    Refill:  0    Order Specific Question:  Supervising Provider    Answer:   Chase Picket [[id number]]   Chest x-ray negative for pneumonia today Steroid taper prescribed Promethazine cough syrup prescribed Sedation precautions  given  COVID and flu testing ordered.  It will take between 1-2 days for test results.  Someone will contact you regarding abnormal results.    Patient should remain in quarantine until they have received Covid results.  If negative you may resume normal activities (go back to work/school) while practicing hand hygiene, social distance, and mask wearing.  If positive, patient should remain in quarantine for 10 days from symptom onset AND greater than 72 hours after symptoms resolution (absence of fever without the use of fever-reducing medication and improvement in respiratory symptoms), whichever is longer Get plenty of rest and push fluids Use OTC zyrtec for nasal congestion, runny nose, and/or sore throat Use OTC flonase for nasal congestion and runny nose Use medications daily for symptom relief Use OTC medications like ibuprofen or tylenol as needed fever or pain Call or go to the ED if you have any new or worsening symptoms such as fever, worsening cough, shortness of breath, chest tightness, chest pain, turning blue, changes in mental status.  Reviewed expectations re: course of current medical issues. Questions answered. Outlined signs and symptoms indicating need for more acute intervention. Patient verbalized understanding. After Visit Summary given.         Faustino Congress, NP 08/22/20 [phone]

## 2020-08-16 NOTE — ED Triage Notes (Signed)
Congestion and cough since Sunday, started having fever, headache  and some shortness of breath. Pt has used neb tx at home today

## 2020-08-18 LAB — COVID-19, FLU A+B NAA
Influenza A, NAA: NOT DETECTED
Influenza B, NAA: NOT DETECTED
SARS-CoV-2, NAA: NOT DETECTED

## 2020-09-03 ENCOUNTER — Other Ambulatory Visit: Payer: Self-pay | Admitting: Internal Medicine

## 2020-09-03 DIAGNOSIS — I10 Essential (primary) hypertension: Secondary | ICD-10-CM

## 2020-09-06 NOTE — Telephone Encounter (Cosign Needed)
HCTZ is not on current med list.

## 2020-09-09 ENCOUNTER — Other Ambulatory Visit: Payer: Self-pay | Admitting: Internal Medicine

## 2020-09-09 DIAGNOSIS — I10 Essential (primary) hypertension: Secondary | ICD-10-CM

## 2020-09-09 DIAGNOSIS — E1169 Type 2 diabetes mellitus with other specified complication: Secondary | ICD-10-CM

## 2020-09-28 ENCOUNTER — Telehealth: Payer: Self-pay | Admitting: *Deleted

## 2020-09-28 ENCOUNTER — Ambulatory Visit: Payer: Medicaid Other | Admitting: Internal Medicine

## 2020-09-28 NOTE — Progress Notes (Signed)
opened in error

## 2020-09-28 NOTE — Telephone Encounter (Signed)
Called patient left voice message for patient to contact our office to reschedule her missed appointment today. She is to call [phone] to reschedule.

## 2020-10-01 ENCOUNTER — Other Ambulatory Visit: Payer: Self-pay | Admitting: Internal Medicine

## 2020-10-01 DIAGNOSIS — I10 Essential (primary) hypertension: Secondary | ICD-10-CM

## 2020-10-03 ENCOUNTER — Other Ambulatory Visit: Payer: Self-pay | Admitting: Internal Medicine

## 2020-10-03 DIAGNOSIS — J45909 Unspecified asthma, uncomplicated: Secondary | ICD-10-CM

## 2020-10-11 ENCOUNTER — Other Ambulatory Visit: Payer: Self-pay | Admitting: Internal Medicine

## 2020-10-11 DIAGNOSIS — I10 Essential (primary) hypertension: Secondary | ICD-10-CM

## 2020-10-29 ENCOUNTER — Other Ambulatory Visit: Payer: Self-pay | Admitting: Student

## 2020-10-29 DIAGNOSIS — K219 Gastro-esophageal reflux disease without esophagitis: Secondary | ICD-10-CM

## 2020-11-12 ENCOUNTER — Other Ambulatory Visit: Payer: Self-pay | Admitting: Internal Medicine

## 2020-11-12 DIAGNOSIS — I10 Essential (primary) hypertension: Secondary | ICD-10-CM

## 2020-11-15 ENCOUNTER — Telehealth: Payer: Self-pay

## 2020-11-15 DIAGNOSIS — E1169 Type 2 diabetes mellitus with other specified complication: Secondary | ICD-10-CM

## 2020-11-15 MED ORDER — ACCU-CHEK GUIDE VI STRP
ORAL_STRIP | 11 refills | Status: DC
Start: 2020-11-15 — End: 2023-12-10

## 2020-11-15 MED ORDER — ACCU-CHEK GUIDE W/DEVICE KIT: 1.0000 | PACK | Freq: Four times a day (QID) | 1 refills | Status: DC

## 2020-11-15 MED ORDER — ACCU-CHEK SOFTCLIX LANCETS MISC: 11 refills | Status: DC

## 2020-11-15 MED ORDER — ACCU-CHEK GUIDE VI STRP
ORAL_STRIP | 11 refills | Status: DC
Start: 2020-11-15 — End: 2020-11-15

## 2020-11-15 NOTE — Telephone Encounter (Signed)
Pls contact pt [phone] regarding PA

## 2020-11-15 NOTE — Telephone Encounter (Signed)
RTC, patient states she cannot get her DEXCOM because she is being told she needs a PA.  States she has been without for several days, has no meter, and doesn't know what her sugars have been running.   Will forward to [name] to follow up.  Patient also states she is out of Tramadol and it was an old RX, wants to know if she can have a refill.  Per chart review, RX was from 05/2019.  Pt informed she will need to be seen in office for evaluation.  She does have a pending appt on 11/21/20 w/ Dr. Claudette Laws.  RN offered pt earlier appt, she declined and states she will wait until 11/21/20. Thanks, SChaplin, RN,BSN

## 2020-11-15 NOTE — Telephone Encounter (Signed)
Ok. Order is signed

## 2020-11-15 NOTE — Telephone Encounter (Signed)
Maria Carpenter returns call and states she has gotten a meter in the past 3 years ([name] Medicaid usually pays for 1 every 3 years) agreed to leave her a Dexcom Sensor and an Accuchek meter at the front desk. She says her pharmacy sent [place] a PA for her Dexcom. I told her I would ask the nurse who usually does PAs about it on her return to work.

## 2020-11-15 NOTE — Telephone Encounter (Signed)
done

## 2020-11-15 NOTE — Telephone Encounter (Signed)
Called Ms. Arruda to find out more about her meter needs. Left a message that we'd look into the PA for her Dexcom  Continuous glucose monitor and request meter and supplies to be sent to [facility] on [address].

## 2020-11-16 ENCOUNTER — Telehealth: Payer: Self-pay | Admitting: *Deleted

## 2020-11-16 NOTE — Telephone Encounter (Signed)
Thank you :)

## 2020-11-16 NOTE — Telephone Encounter (Signed)
Call to [facility] [employer] at [phone] for PA for Dexcom G6 Senors.  Information was provided.  Sensors were approved 11/16/2020 thru 11/16/2021.  PA [id number].  Angelina Ok, RN 11/16/2020 9:13 AM.

## 2020-11-20 NOTE — Patient Instructions (Incomplete)
Maria Carpenter,   Thank you for your visit to the [facility] today. It was a pleasure seeing you. Today we discussed the following:  1) xyz  2) xyz  3) xyz   We would like to see you back in xyz. Please bring all of your medications with you.   If you have any questions or concerns, please call our clinic at [phone] between 9am-5pm. Outside of these hours, call [phone] and ask for the internal medicine resident on call. If you feel you are having a medical emergency please call 911.

## 2020-11-20 NOTE — Progress Notes (Deleted)
   CC: ***  HPI:  [name] is a 40 y.o. woman with history as below who presents to clinic for ***. Her last clinic visit was on ***.   To see the details of this patient's management of their acute and chronic problems, please refer to the Assessment & Plan under the Encounters tab.    Past Medical History:  Diagnosis Date  . Anemia   . Asthma   . Chlamydia 2012   treated  . Complication of anesthesia    " they have trouble waking me up"  . Diabetes mellitus    A2DM during pregnancy. now on metformin.   . Family history of anesthesia complication    mother & daughter   . GERD (gastroesophageal reflux disease)   . Gonorrhea 2010   treated  . Hypertension   . Lupus ([facility]) 10-2013  . Morbid obesity ([facility]CC) 07/22/2011  . Pregnancy induced hypertension    pre E with pregnancies  . Rheumatoid arthritis(714.0)   . Shortness of breath   . Sleep apnea    does not use cpap  . Stroke [facility]) 2012   March 2012>right side deficit (speech, upper/ower weakness)  . SVT (supraventricular tachycardia) (HCC)    Review of Systems:    ROS  Physical Exam:  There were no vitals filed for this visit. Constitutional: well-appearing {GC/GE:3044014::"man","woman"} sitting in chair, in no acute distress HENT: normocephalic atraumatic, mucous membranes moist Eyes: conjunctiva non-erythematous Neck: supple Cardiovascular: regular rate and rhythm, no m/r/g Pulmonary/Chest: normal work of breathing on room air, lungs clear to auscultation bilaterally Abdominal: soft, non-tender, non-distended MSK: normal bulk and tone Neurological: alert & oriented x 3, 5/5 strength in bilateral upper and lower extremities, normal gait Skin: warm and dry*** Psych: ***    Assessment & Plan:   See Encounters Tab for problem-based charting.  Patient {GC/GE:3044014::"discussed with","seen with"} Dr. {NAMES:3044014::"Williams","Guilloud","Hoffman","Mullen","Narendra","Raines","Vincent"}

## 2020-11-21 ENCOUNTER — Telehealth: Payer: Self-pay | Admitting: *Deleted

## 2020-11-21 ENCOUNTER — Encounter: Payer: Medicaid Other | Admitting: Student

## 2020-11-21 NOTE — Telephone Encounter (Signed)
Call to patient regarding missed appointment.  Message was left to call to reschedule if needed.  Angelina Ok, RN 11/21/2020 10:49 AM.

## 2020-11-29 ENCOUNTER — Other Ambulatory Visit: Payer: Self-pay | Admitting: Internal Medicine

## 2020-11-29 ENCOUNTER — Other Ambulatory Visit: Payer: Self-pay | Admitting: Student

## 2020-11-29 DIAGNOSIS — E1169 Type 2 diabetes mellitus with other specified complication: Secondary | ICD-10-CM

## 2021-01-02 ENCOUNTER — Telehealth: Payer: Self-pay | Admitting: Internal Medicine

## 2021-01-02 NOTE — Telephone Encounter (Signed)
Patient has Asthma and not currently on/requiring a  maintenance inhaler. Received message in resident team box from pharmacy to consider starting a maintenance inhaler given multiple rescue inhaler fills in last 180 days.  Attempted to reach patient and reached voicemail. Recommend discussing at next office visit.

## 2021-01-13 ENCOUNTER — Other Ambulatory Visit: Payer: Self-pay | Admitting: Student

## 2021-01-13 DIAGNOSIS — E1169 Type 2 diabetes mellitus with other specified complication: Secondary | ICD-10-CM

## 2021-01-23 ENCOUNTER — Encounter: Payer: Self-pay | Admitting: *Deleted

## 2021-02-28 ENCOUNTER — Encounter: Payer: Self-pay | Admitting: *Deleted

## 2021-03-15 ENCOUNTER — Telehealth: Payer: Self-pay | Admitting: Internal Medicine

## 2021-03-15 ENCOUNTER — Encounter: Payer: Self-pay | Admitting: Internal Medicine

## 2021-03-15 NOTE — Telephone Encounter (Signed)
Please refer to message below.  Attempted to contact patient for an appointment, but no answer.  Left detailed message asking to please give our office a call back.  Sending a letter also, requesting the same.

## 2021-03-15 NOTE — Telephone Encounter (Signed)
-----   Message from Baird Cancer, RD sent at 03/13/2021  4:06 PM EDT ----- Regarding: Please schdule with PCP Thank you!  [name]

## 2021-03-15 NOTE — Telephone Encounter (Signed)
Thank you :)

## 2021-05-10 ENCOUNTER — Encounter: Payer: Medicaid Other | Admitting: Obstetrics & Gynecology

## 2021-05-11 ENCOUNTER — Encounter: Payer: Medicaid Other | Admitting: Student

## 2021-05-17 ENCOUNTER — Other Ambulatory Visit: Payer: Self-pay | Admitting: Internal Medicine

## 2021-05-17 DIAGNOSIS — E1169 Type 2 diabetes mellitus with other specified complication: Secondary | ICD-10-CM

## 2021-06-05 ENCOUNTER — Other Ambulatory Visit: Payer: Self-pay | Admitting: Student

## 2021-06-05 DIAGNOSIS — E1169 Type 2 diabetes mellitus with other specified complication: Secondary | ICD-10-CM

## 2021-06-06 NOTE — Telephone Encounter (Signed)
Medication refilled.  Patient needs appointment for A1c.  Last A1c in 2021.

## 2021-06-07 NOTE — Telephone Encounter (Signed)
Patient needs appointment for A1c.  Last A1c in 2021. [[facility] [address] Mcarthur Rossetti  Thanks

## 2021-06-12 NOTE — Telephone Encounter (Signed)
Attempted to contact patient today via telephone.  No answer left detailed message to please call the clinic back. Future appointment has been scheduled for 07/07/2021 at 9:15 am with Dr. Allena Katz and mailed to current address on file.

## 2021-07-07 ENCOUNTER — Encounter: Payer: Medicaid Other | Admitting: Internal Medicine

## 2021-07-07 NOTE — Progress Notes (Incomplete)
CC: medication refill/ A1c overdue   HPI:  Ms.Azalea E Penaloza is a 40 y.o. female with a PMHx stated below and presents today for stated above.   Please see problem based assessment and plan for additional details.  Past Medical History:  Diagnosis Date   Anemia    Asthma    Chlamydia 2012   treated   Complication of anesthesia    " they have trouble waking me up"   Diabetes mellitus    A2DM during pregnancy. now on metformin.    Family history of anesthesia complication    mother & daughter    GERD (gastroesophageal reflux disease)    Gonorrhea 2010   treated   Hypertension    Lupus ([place]) 10-2013   Morbid obesity ([place]) 07/22/2011   Pregnancy induced hypertension    pre E with pregnancies   Rheumatoid arthritis(714.0)    Shortness of breath    Sleep apnea    does not use cpap   Stroke [facility]) 2012   March 2012>right side deficit (speech, upper/ower weakness)   SVT (supraventricular tachycardia) ([place])     Current Outpatient Medications on File Prior to Visit  Medication Sig Dispense Refill   Accu-Chek Softclix Lancets lancets Check blood sugar 3 times a day 100 each 11   acetaminophen (TYLENOL) 325 MG tablet Take 2 tablets (650 mg total) by mouth every 6 (six) hours as needed for mild pain (or Fever >/= 101). 30 tablet 0   amLODipine (NORVASC) 10 MG tablet TAKE 1 TABLET BY MOUTH EVERY DAY 90 tablet 1   B-D UF III MINI PEN NEEDLES 31G X 5 MM MISC USE 5 TIMES A DAY AS DIRECTED 500 each 1   blood glucose meter kit and supplies KIT Dispense based on patient and insurance preference. Use up to four times daily as directed. (FOR ICD-9 250.00, 250.01). 1 each 0   Blood Glucose Monitoring Suppl (ACCU-CHEK GUIDE) w/Device KIT 1 each by Does not apply route 4 (four) times daily. Check blood sugar 3 times a day 1 kit 1   chlorthalidone (HYGROTON) 50 MG tablet Take 1 tablet (50 mg total) by mouth daily. 30 tablet 1   Cholecalciferol 25 MCG (1000 UT) capsule Take 2 capsules  (2,000 Units total) by mouth daily. 60 capsule 0   clindamycin (CLEOCIN T) 1 % lotion Apply in am     clindamycin (CLEOCIN) 300 MG capsule Take 300 mg by mouth 3 (three) times daily.     clindamycin-benzoyl peroxide (BENZACLIN) gel Apply topically 2 (two) times daily. 25 g 0   Continuous Blood Gluc Receiver (DEXCOM G6 RECEIVER) DEVI Use to check blood sugar at least 6 times a day 1 each 0   Continuous Blood Gluc Sensor (DEXCOM G6 SENSOR) MISC Check blood sugar at least 6 times a day 3 each 11   Continuous Blood Gluc Transmit (DEXCOM G6 TRANSMITTER) MISC Use to check blood sugar at least 6 times a day 1 each 3   Dermatological Products, Misc. (NUVAIL) SOLN Apply to affected nails daily 15 mL 2   gabapentin (NEURONTIN) 300 MG capsule Take 1 capsule (300 mg total) by mouth at bedtime. 90 capsule 3   glucose blood (ACCU-CHEK GUIDE) test strip Check blood sugar 3 times daily. DIAG CODE E11.9. INSULIN DEPENDENT 100 strip 11   hyoscyamine (LEVSIN) 0.125 MG tablet TAKE 1 TABLET BY MOUTH 3 TIMES A DAY AS NEEDED 30 tablet 3   ibuprofen (ADVIL,MOTRIN) 200 MG tablet Take 800 mg by  mouth every 6 (six) hours as needed for moderate pain.     Insulin Pen Needle (PEN NEEDLES) 32G X 5 MM MISC 1 each by Does not apply route 5 (five) times daily. 100 each 3   lansoprazole (PREVACID) 15 MG capsule TAKE 1 CAPSULE (15 MG TOTAL) BY MOUTH DAILY AT 12 NOON. 60 capsule 2   LANTUS SOLOSTAR 100 UNIT/ML Solostar Pen INJECT 30 UNITS INTO THE SKIN DAILY AT 10 PM. 27 mL 2   lisinopril (ZESTRIL) 40 MG tablet TAKE 1 TABLET BY MOUTH EVERY DAY 90 tablet 1   loratadine (CLARITIN) 10 MG tablet Take 1 tablet (10 mg total) by mouth daily. 30 tablet 0   metFORMIN (GLUCOPHAGE) 1000 MG tablet TAKE 1 TABLET (1,000 MG TOTAL) BY MOUTH 2 (TWO) TIMES DAILY WITH A MEAL. 180 tablet 3   Multiple Vitamin (MULTIVITAMIN WITH MINERALS) TABS tablet Take 1 tablet by mouth daily.     NOVOLOG FLEXPEN 100 UNIT/ML FlexPen PER CORRECTION SCALE (UP TO 8 UNIT  THREE TIMES A DAY) 15 mL 2   predniSONE (DELTASONE) 20 MG tablet Take 1 tablet (20 mg total) by mouth daily with breakfast. 5 tablet 0   PROAIR HFA 108 (90 Base) MCG/ACT inhaler INHALE 2 PUFFS INTO LUNGS EVERY 6 HOURS AS NEEDED FOR WHEEZING 8.5 each 3   Probiotic Product (PROBIOTIC PO) Take 1 capsule by mouth daily.     promethazine-dextromethorphan (PROMETHAZINE-DM) 6.25-15 MG/5ML syrup Take 5 mLs by mouth 4 (four) times daily as needed for cough. 118 mL 0   propranolol (INDERAL) 20 MG tablet Take 2 tablets (40 mg total) by mouth 2 (two) times daily. 120 tablet 3   rifampin (RIFADIN) 300 MG capsule Take 300 mg by mouth 2 (two) times daily.     rosuvastatin (CRESTOR) 10 MG tablet TAKE 1 TABLET BY MOUTH EVERY DAY 90 tablet 1   spironolactone (ALDACTONE) 25 MG tablet Take 1 tablet (25 mg total) by mouth daily. 60 tablet 2   [DISCONTINUED] budesonide (PULMICORT) 180 MCG/ACT inhaler Inhale 1 puff into the lungs 2 (two) times daily. 1 each 1   No current facility-administered medications on file prior to visit.    Family History  Problem Relation Age of Onset   Anesthesia problems Mother        difficult to arouse, SOB   Diabetes Mother    Hypertension Mother    Stroke Mother    Diabetes Father    Hypertension Father    Heart disease Father    Cancer Maternal Aunt    Cancer Maternal Grandmother    Breast cancer Maternal Grandmother    Breast cancer Cousin     Social History   Socioeconomic History   Marital status: Married    Spouse name: Not on file   Number of children: Not on file   Years of education: Not on file   Highest education level: Not on file  Occupational History   Not on file  Tobacco Use   Smoking status: Some Days    Packs/day: 0.25    Years: 4.00    Pack years: 1.00    Types: Cigarettes   Smokeless tobacco: Never   Tobacco comments:    6-7 cig/day  Vaping Use   Vaping Use: Never used  Substance and Sexual Activity   Alcohol use: Not Currently     Comment: socially   Drug use: Not Currently    Types: Marijuana    Comment: last month   Sexual activity: Yes  Birth control/protection: Other-see comments, Surgical  Other Topics Concern   Not on file  Social History Narrative   Not on file   Social Determinants of Health   Financial Resource Strain: Not on file  Food Insecurity: Not on file  Transportation Needs: Not on file  Physical Activity: Not on file  Stress: Not on file  Social Connections: Not on file  Intimate Partner Violence: Not on file    Review of Systems: ROS negative except for what is noted on the assessment and plan.  There were no vitals filed for this visit.   Physical Exam: Constitutional: alert, well-appearing, in NAD HENT: normocephalic, atraumatic, mucous membranes moist Eyes: conjunctiva non-erythematous, EOMI Cardiovascular: RRR, no m/r/g, non-edematous bilateral LE Pulmonary/Chest: normal work of breathing on room air, LCTAB Abdominal: soft, non-tender to palpation, non-distended MSK: normal bulk and tone Psych: normal behavior, normal affect  BP ** today, remains not at goal despite appropriately taking 5 agents. Regimen includes: Chlorthalidone 50 qd, Amlodipine 10, spironolactone 25 qd, Lisinopril 40 qd, propranolol 40 bid. Home readings ranging SBP **. Poor adherence to lifestyle modifications; counseled on the importance of daily exercise, low salt diet, and weight loss. Suspect class 3 obesity with probable OSA contributing to uncontrolled HTN. Baseline Cr 0.7, will get BMP today. Denies headaches, changes in vision, chest pain, SHOB, and leg swelling. Exam benign and vitals otherwise wnl.   Continue current regimen stated above Continue lifestyle modifications: weight loss, daily exercise, and low salt diet BP log and bring to next visit  BMP   A1c ** and CBG **; A1c 9.3 ** 1 ** year ago. Not at goal despite appropriately taking **. Home monitoring with ranges from **. Poor adherence  to lifestyle modifications; counseled on the importance of daily exercise, limiting processed foods and high sugar foods, and weight loss. Denies polydipsia, polyuria, weight loss, lethargy, blurry vision, and changes in sensation. Benign physical exam and vitals wnl.    A1c ordered  Continue current regimen stated above Continue lifestyle modifications: daily exercise, limiting processed foods and high sugar foods, and weight loss  CTM CBG's and bring glucometer to next visit    Lipid panel ** ago with LDL **. Report excellent compliance to **. ASCVD 10 year risk **. Counseled on the importance of continued lifestyle modifications including: weight loss, daily exercise, and healthy diet with limited processed and fatty foods.   Lipid panel ordered  Continue current regimen above Continue with lifestyle modifications   > 74 year old pt with pertinent PMHx of DM2 and HTN. ASCVD 10 year risk **. Lipid panel ordered. Counseled on the importance of continued lifestyle modifications including: weight loss, daily exercise, and healthy diet with limited processed and fatty foods.   Follow up on lipid panel and start statin therapy if appropriate   Flu shot offered, pt refused Pneumococcal vaccine offered, pt refused  Colonoscopy referral offered, referral placed Mammogram referral offered, referral placed Ophthalmology referral for annual eye exam offered, referral placed Pap smear offered, pt deferring until next office visit     Assessment & Plan:   See Encounters Tab for problem based charting.  Patient seen with Dr. {[id number]::"XMIWOEHO","Z. Hoffman","Mullen","Narendra","Raines","Vincent","Guilloud"}  Lajean Manes, MD  Internal Medicine Resident, PGY-1 [facility]  9:54 AM, 07/07/2021

## 2021-07-08 ENCOUNTER — Other Ambulatory Visit: Payer: Self-pay | Admitting: Internal Medicine

## 2021-07-08 DIAGNOSIS — K219 Gastro-esophageal reflux disease without esophagitis: Secondary | ICD-10-CM

## 2021-08-12 ENCOUNTER — Other Ambulatory Visit: Payer: Self-pay | Admitting: Internal Medicine

## 2021-08-14 ENCOUNTER — Encounter: Payer: Self-pay | Admitting: Internal Medicine

## 2021-08-14 NOTE — Telephone Encounter (Signed)
Attempted to contact patient, but no answer. Left detailed message asking to call the clinic back to schedule an appointment.  Also going to send a letter requesting the same.

## 2021-08-26 ENCOUNTER — Telehealth: Payer: Medicaid Other | Admitting: Nurse Practitioner

## 2021-08-26 DIAGNOSIS — U071 COVID-19: Secondary | ICD-10-CM | POA: Diagnosis not present

## 2021-08-26 MED ORDER — PREDNISONE 20 MG PO TABS: 40.0000 mg | ORAL_TABLET | Freq: Every day | ORAL | 0 refills | Status: AC

## 2021-08-26 MED ORDER — PROMETHAZINE-DM 6.25-15 MG/5ML PO SYRP: 5.0000 mL | ORAL_SOLUTION | Freq: Four times a day (QID) | ORAL | 0 refills | Status: DC | PRN

## 2021-08-26 MED ORDER — MOLNUPIRAVIR EUA 200MG CAPSULE: 4.0000 | ORAL_CAPSULE | Freq: Two times a day (BID) | ORAL | 0 refills | Status: AC

## 2021-08-26 MED ORDER — ALBUTEROL SULFATE HFA 108 (90 BASE) MCG/ACT IN AERS: 2.0000 | INHALATION_SPRAY | Freq: Four times a day (QID) | RESPIRATORY_TRACT | 0 refills | Status: DC | PRN

## 2021-08-26 NOTE — Patient Instructions (Signed)
You are being prescribed MOLNUPIRAVIR for COVID-19 infection.  ° ° °Please call the pharmacy or go through the drive through vs going inside if you are picking up the mediation yourself to prevent further spread. If prescribed to a [place] affiliated pharmacy, a pharmacist will bring the medication out to your car. ° ° °ADMINISTRATION INSTRUCTIONS: °Take with or without food. Swallow the tablets whole. Don't chew, crush, or break the medications because it might not work as well ° °For each dose of the medication, you should be taking FOUR tablets at one time, TWICE a day  ° °Finish your full five-day course of Molnupiravir even if you feel better before you're done. Stopping this medication too early can make it less effective to prevent severe illness related to COVID19.   ° °Molnupiravir is prescribed for YOU ONLY. Don't share it with others, even if they have similar symptoms as you. This medication might not be right for everyone.  ° °Make sure to take steps to protect yourself and others while you're taking this medication in order to get well soon and to prevent others from getting sick with COVID-19. ° ° °**If you are of childbearing potential (any gender) - it is advised to not get pregnant while taking this medication and recommended that condoms are used for female partners the next 3 months after taking the medication out of extreme caution  ° ° °COMMON SIDE EFFECTS: °Diarrhea °Nausea  °Dizziness ° ° ° °If your COVID-19 symptoms get worse, get medical help right away. Call 911 if you experience symptoms such as worsening cough, trouble breathing, chest pain that doesn't go away, confusion, a hard time staying awake, and pale or blue-colored skin. °This medication won't prevent all COVID-19 cases from getting worse.  ° ° °

## 2021-08-26 NOTE — Progress Notes (Signed)
Virtual Visit Consent   Maria Carpenter, you are scheduled for a virtual visit with Mary-Margaret Hassell Done, [place], a [facility] provider, today.     Just as with appointments in the office, your consent must be obtained to participate.  Your consent will be active for this visit and any virtual visit you may have with one of our providers in the next 365 days.     If you have a MyChart account, a copy of this consent can be sent to you electronically.  All virtual visits are billed to your insurance company just like a traditional visit in the office.    As this is a virtual visit, video technology does not allow for your provider to perform a traditional examination.  This may limit your provider's ability to fully assess your condition.  If your provider identifies any concerns that need to be evaluated in person or the need to arrange testing (such as labs, EKG, etc.), we will make arrangements to do so.     Although advances in technology are sophisticated, we cannot ensure that it will always work on either your end or our end.  If the connection with a video visit is poor, the visit may have to be switched to a telephone visit.  With either a video or telephone visit, we are not always able to ensure that we have a secure connection.     I need to obtain your verbal consent now.   Are you willing to proceed with your visit today? YES   Maria Carpenter has provided verbal consent on 08/26/2021 for a virtual visit (video or telephone).   Mary-Margaret Hassell Done, FNP   Date: 08/26/2021 5:39 PM   Virtual Visit via Video Note   I, Mary-Margaret Hassell Done, connected with Maria Carpenter ([id number], [birth year]) on 08/26/21 at  5:30 PM EST by a video-enabled telemedicine application and verified that I am speaking with the correct person using two identifiers.  Location: Patient: Virtual Visit Location Patient: Home Provider: Virtual Visit Location Provider: Mobile   I discussed the  limitations of evaluation and management by telemedicine and the availability of in person appointments. The patient expressed understanding and agreed to proceed.    History of Present Illness: Maria Carpenter is a 40 y.o. who identifies as a female who was assigned female at birth, and is being seen today for covid positive.  HPI: URI  This is a new problem. The current episode started in the past 7 days. The problem has been gradually worsening. The maximum temperature recorded prior to her arrival was 100.4 - 100.9 F. Associated symptoms include congestion, coughing, diarrhea, headaches, rhinorrhea and a sore throat. She has tried acetaminophen, decongestant and NSAIDs for the symptoms. The treatment provided mild relief.  Tetsedpositive for covid today. She has lupus and gets sick very easily. Has slight SOB. Review of Systems  HENT:  Positive for congestion, rhinorrhea and sore throat.   Respiratory:  Positive for cough.   Gastrointestinal:  Positive for diarrhea.  Neurological:  Positive for headaches.   Problems:  Patient Active Problem List   Diagnosis Date Noted   Depression 06/29/2020   Left renal artery stenosis ([place]) 06/29/2020   Vitamin D deficiency 06/29/2020   Discoloration of nail 06/29/2020   Low back pain 06/10/2019   Dyspnea 05/07/2019   Hidradenitis suppurativa 04/23/2019   Fatigue 04/11/2019   Dysuria 04/11/2019   Asthma    Breast lump    Influenza B 08/30/2018  Abnormal uterine bleeding (AUB) 07/25/2017   SVT (supraventricular tachycardia) ([place]) 06/23/2015   Systemic lupus erythematosus ([place]) 10/25/2013   TIA (transient ischemic attack) 07/02/2012   Diabetes ([place]) 07/02/2012   Hypertension 07/02/2012   H/O: CVA (cerebrovascular accident) 07/02/2012   GERD (gastroesophageal reflux disease) 07/02/2012   Obstructive sleep apnea 07/02/2012   CTS (carpal tunnel syndrome) 07/02/2012   Morbid obesity ([place]) 07/22/2011   Choledocholithiasis with acute  cholecystitis 07/22/2011   Stroke ([place]) 2012    Allergies:  Allergies  Allergen Reactions   Amoxicillin Anaphylaxis and Rash    Has patient had a PCN reaction causing immediate rash, facial/tongue/throat swelling, SOB or lightheadedness with hypotension: Yes Has patient had a PCN reaction causing severe rash involving mucus membranes or skin necrosis: Yes Has patient had a PCN reaction that required hospitalization Yes Has patient had a PCN reaction occurring within the last 10 years: No If all of the above answers are "NO", then may proceed with Cephalosporin use.    Dilaudid [Hydromorphone Hcl] Itching   Eggs Or Egg-Derived Products Nausea And Vomiting and Rash   Medications:  Current Outpatient Medications:    Accu-Chek Softclix Lancets lancets, Check blood sugar 3 times a day, Disp: 100 each, Rfl: 11   acetaminophen (TYLENOL) 325 MG tablet, Take 2 tablets (650 mg total) by mouth every 6 (six) hours as needed for mild pain (or Fever >/= 101)., Disp: 30 tablet, Rfl: 0   amLODipine (NORVASC) 10 MG tablet, TAKE 1 TABLET BY MOUTH EVERY DAY, Disp: 90 tablet, Rfl: 1   B-D UF III MINI PEN NEEDLES 31G X 5 MM MISC, USE 5 TIMES A DAY AS DIRECTED, Disp: 500 each, Rfl: 1   blood glucose meter kit and supplies KIT, Dispense based on patient and insurance preference. Use up to four times daily as directed. (FOR ICD-9 250.00, 250.01)., Disp: 1 each, Rfl: 0   Blood Glucose Monitoring Suppl (ACCU-CHEK GUIDE) w/Device KIT, 1 each by Does not apply route 4 (four) times daily. Check blood sugar 3 times a day, Disp: 1 kit, Rfl: 1   chlorthalidone (HYGROTON) 50 MG tablet, Take 1 tablet (50 mg total) by mouth daily., Disp: 30 tablet, Rfl: 1   Cholecalciferol 25 MCG (1000 UT) capsule, Take 2 capsules (2,000 Units total) by mouth daily., Disp: 60 capsule, Rfl: 0   clindamycin (CLEOCIN T) 1 % lotion, Apply in am, Disp: , Rfl:    clindamycin (CLEOCIN) 300 MG capsule, Take 300 mg by mouth 3 (three) times daily.,  Disp: , Rfl:    clindamycin-benzoyl peroxide (BENZACLIN) gel, Apply topically 2 (two) times daily., Disp: 25 g, Rfl: 0   Continuous Blood Gluc Receiver ([place]) DEVI, Use to check blood sugar at least 6 times a day, Disp: 1 each, Rfl: 0   Continuous Blood Gluc Sensor (DEXCOM G6 SENSOR) MISC, Check blood sugar at least 6 times a day, Disp: 3 each, Rfl: 11   Continuous Blood Gluc Transmit (DEXCOM G6 TRANSMITTER) MISC, Use to check blood sugar at least 6 times a day, Disp: 1 each, Rfl: 3   Dermatological Products, Misc. (NUVAIL) SOLN, Apply to affected nails daily, Disp: 15 mL, Rfl: 2   gabapentin (NEURONTIN) 300 MG capsule, Take 1 capsule (300 mg total) by mouth at bedtime., Disp: 90 capsule, Rfl: 3   glucose blood (ACCU-CHEK GUIDE) test strip, Check blood sugar 3 times daily. DIAG CODE E11.9. INSULIN DEPENDENT, Disp: 100 strip, Rfl: 11   hyoscyamine (LEVSIN) 0.125 MG tablet, TAKE 1  TABLET BY MOUTH 3 TIMES A DAY AS NEEDED, Disp: 30 tablet, Rfl: 3   ibuprofen (ADVIL,MOTRIN) 200 MG tablet, Take 800 mg by mouth every 6 (six) hours as needed for moderate pain., Disp: , Rfl:    Insulin Pen Needle (PEN NEEDLES) 32G X 5 MM MISC, 1 each by Does not apply route 5 (five) times daily., Disp: 100 each, Rfl: 3   lansoprazole (PREVACID) 15 MG capsule, TAKE 1 CAPSULE (15 MG TOTAL) BY MOUTH DAILY AT 12 NOON., Disp: 60 capsule, Rfl: 2   LANTUS SOLOSTAR 100 UNIT/ML Solostar Pen, INJECT 30 UNITS INTO THE SKIN DAILY AT 10 PM., Disp: 27 mL, Rfl: 2   lisinopril (ZESTRIL) 40 MG tablet, TAKE 1 TABLET BY MOUTH EVERY DAY, Disp: 90 tablet, Rfl: 1   loratadine (CLARITIN) 10 MG tablet, Take 1 tablet (10 mg total) by mouth daily., Disp: 30 tablet, Rfl: 0   metFORMIN (GLUCOPHAGE) 1000 MG tablet, TAKE 1 TABLET (1,000 MG TOTAL) BY MOUTH 2 (TWO) TIMES DAILY WITH A MEAL., Disp: 180 tablet, Rfl: 3   Multiple Vitamin (MULTIVITAMIN WITH MINERALS) TABS tablet, Take 1 tablet by mouth daily., Disp: , Rfl:    NOVOLOG FLEXPEN 100  UNIT/ML FlexPen, PER CORRECTION SCALE (UP TO 8 UNIT THREE TIMES A DAY), Disp: 15 mL, Rfl: 2   predniSONE (DELTASONE) 20 MG tablet, Take 1 tablet (20 mg total) by mouth daily with breakfast., Disp: 5 tablet, Rfl: 0   PROAIR HFA 108 (90 Base) MCG/ACT inhaler, INHALE 2 PUFFS INTO LUNGS EVERY 6 HOURS AS NEEDED FOR WHEEZING, Disp: 8.5 each, Rfl: 3   Probiotic Product (PROBIOTIC PO), Take 1 capsule by mouth daily., Disp: , Rfl:    promethazine-dextromethorphan (PROMETHAZINE-DM) 6.25-15 MG/5ML syrup, Take 5 mLs by mouth 4 (four) times daily as needed for cough., Disp: 118 mL, Rfl: 0   propranolol (INDERAL) 20 MG tablet, Take 2 tablets (40 mg total) by mouth 2 (two) times daily., Disp: 120 tablet, Rfl: 3   rifampin (RIFADIN) 300 MG capsule, Take 300 mg by mouth 2 (two) times daily., Disp: , Rfl:    rosuvastatin (CRESTOR) 10 MG tablet, TAKE 1 TABLET BY MOUTH EVERY DAY, Disp: 90 tablet, Rfl: 1   spironolactone (ALDACTONE) 25 MG tablet, Take 1 tablet (25 mg total) by mouth daily., Disp: 60 tablet, Rfl: 2  Observations/Objective: Patient is well-developed, well-nourished in no acute distress.  Resting comfortably  at home.  Head is normocephalic, atraumatic.  No labored breathing.  Speech is clear and coherent with logical content.  Patient is alert and oriented at baseline.  Raspy voice Deep wet cough  Assessment and Plan:  Maria Carpenter in today with chief complaint of Covid Positive (/)   1. Lab test positive for detection of COVID-19 virus. Maria Carpenter in today with chief complaint of Covid Positive (/)   1. Lab test positive for detection of COVID-19 virus 1. Take meds as prescribed 2. Use a cool mist humidifier especially during the winter months and when heat has been humid. 3. Use saline nose sprays frequently 4. Saline irrigations of the nose can be very helpful if done frequently.  * 4X daily for 1 week*  * Use of a nettie pot can be helpful with this. Follow directions  with this* 5. Drink plenty of fluids 6. Keep thermostat turn down low 7.For any cough or congestion- promethazine dextromethorphan 8. For fever or aces or pains- take tylenol or ibuprofen appropriate for age and weight.  * for fevers  greater than 101 orally you may alternate ibuprofen and tylenol every  3 hours.   Meds ordered this encounter  Medications   molnupiravir EUA (LAGEVRIO) 200 mg CAPS capsule    Sig: Take 4 capsules (800 mg total) by mouth 2 (two) times daily for 5 days.    Dispense:  40 capsule    Refill:  0    Order Specific Question:   Supervising Provider    Answer:   MILLER, BRIAN [[id number]]   predniSONE (DELTASONE) 20 MG tablet    Sig: Take 2 tablets (40 mg total) by mouth daily with breakfast for 5 days. 2 po daily for 5 days    Dispense:  10 tablet    Refill:  0    Order Specific Question:   Supervising Provider    Answer:   Sabra Heck, BRIAN [[id number]]   albuterol (VENTOLIN HFA) 108 (90 Base) MCG/ACT inhaler    Sig: Inhale 2 puffs into the lungs every 6 (six) hours as needed for wheezing or shortness of breath.    Dispense:  8 g    Refill:  0    Order Specific Question:   Supervising Provider    Answer:   Sabra Heck, BRIAN [[id number]]   promethazine-dextromethorphan (PROMETHAZINE-DM) 6.25-15 MG/5ML syrup    Sig: Take 5 mLs by mouth 4 (four) times daily as needed for cough.    Dispense:  118 mL    Refill:  0    Order Specific Question:   Supervising Provider    Answer:   Noemi Chapel [[id number]]      Follow Up Instructions: I discussed the assessment and treatment plan with the patient. The patient was provided an opportunity to ask questions and all were answered. The patient agreed with the plan and demonstrated an understanding of the instructions.  A copy of instructions were sent to the patient via MyChart.  The patient was advised to call back or seek an in-person evaluation if the symptoms worsen or if the condition fails to improve as anticipated.  Time:  I spent 9  minutes with the patient via telehealth technology discussing the above problems/concerns.    Mary-Margaret Hassell Done, FNP

## 2021-11-09 ENCOUNTER — Encounter: Payer: Medicaid Other | Admitting: Student

## 2021-11-09 ENCOUNTER — Encounter: Payer: Self-pay | Admitting: Internal Medicine

## 2022-02-17 ENCOUNTER — Encounter: Payer: Self-pay | Admitting: *Deleted

## 2022-03-26 ENCOUNTER — Ambulatory Visit: Payer: Medicaid Other | Admitting: Internal Medicine

## 2022-03-26 ENCOUNTER — Encounter: Payer: Self-pay | Admitting: Internal Medicine

## 2022-03-26 ENCOUNTER — Other Ambulatory Visit: Payer: Self-pay

## 2022-03-26 VITALS — BP 186/116 | HR 85 | Temp 98.1°F | Ht 66.5 in | Wt 370.5 lb

## 2022-03-26 DIAGNOSIS — Z794 Long term (current) use of insulin: Secondary | ICD-10-CM | POA: Diagnosis not present

## 2022-03-26 DIAGNOSIS — I10 Essential (primary) hypertension: Secondary | ICD-10-CM | POA: Diagnosis not present

## 2022-03-26 DIAGNOSIS — M722 Plantar fascial fibromatosis: Secondary | ICD-10-CM | POA: Insufficient documentation

## 2022-03-26 DIAGNOSIS — F17210 Nicotine dependence, cigarettes, uncomplicated: Secondary | ICD-10-CM | POA: Diagnosis not present

## 2022-03-26 DIAGNOSIS — E1169 Type 2 diabetes mellitus with other specified complication: Secondary | ICD-10-CM

## 2022-03-26 LAB — GLUCOSE, CAPILLARY: Glucose-Capillary: 185 mg/dL — ABNORMAL HIGH (ref 70–99)

## 2022-03-26 LAB — POCT GLYCOSYLATED HEMOGLOBIN (HGB A1C): Hemoglobin A1C: 8.5 % — AB (ref 4.0–5.6)

## 2022-03-26 MED ORDER — METFORMIN HCL 1000 MG PO TABS: ORAL_TABLET | ORAL | 3 refills | Status: DC

## 2022-03-26 MED ORDER — AMLODIPINE BESYLATE 10 MG PO TABS: 10.0000 mg | ORAL_TABLET | Freq: Every day | ORAL | 1 refills | Status: DC

## 2022-03-26 MED ORDER — SEMAGLUTIDE(0.25 OR 0.5MG/DOS) 2 MG/3ML ~~LOC~~ SOPN: 0.2500 mg | PEN_INJECTOR | SUBCUTANEOUS | 1 refills | Status: AC

## 2022-03-26 MED ORDER — DEXCOM G6 TRANSMITTER MISC: 3 refills | Status: DC

## 2022-03-26 MED ORDER — PROPRANOLOL HCL 20 MG PO TABS: 40.0000 mg | ORAL_TABLET | Freq: Two times a day (BID) | ORAL | 3 refills | Status: DC

## 2022-03-26 MED ORDER — LISINOPRIL 40 MG PO TABS: 40.0000 mg | ORAL_TABLET | Freq: Every day | ORAL | 3 refills | Status: DC

## 2022-03-26 MED ORDER — DEXCOM G6 SENSOR MISC: 11 refills | Status: DC

## 2022-03-26 MED ORDER — CHLORTHALIDONE 25 MG PO TABS: 25.0000 mg | ORAL_TABLET | Freq: Every day | ORAL | 0 refills | Status: DC

## 2022-03-26 NOTE — Progress Notes (Signed)
CC: hypertension/diabetes  HPI:  [name] is a 41 y.o. female living with a history stated below and presents today for a follow up of her hypertension and diabetes. Please see problem based assessment and plan for additional details.  Past Medical History:  Diagnosis Date   Anemia    Asthma    Chlamydia 2012   treated   Complication of anesthesia    " they have trouble waking me up"   Diabetes mellitus    A2DM during pregnancy. now on metformin.    Family history of anesthesia complication    mother & daughter    GERD (gastroesophageal reflux disease)    Gonorrhea 2010   treated   Hypertension    Lupus ([place]) 10-2013   Morbid obesity ([place]) 07/22/2011   Pregnancy induced hypertension    pre E with pregnancies   Rheumatoid arthritis(714.0)    Shortness of breath    Sleep apnea    does not use cpap   Stroke [facility]) 2012   March 2012>right side deficit (speech, upper/ower weakness)   SVT (supraventricular tachycardia) ([place])     Current Outpatient Medications on File Prior to Visit  Medication Sig Dispense Refill   Accu-Chek Softclix Lancets lancets Check blood sugar 3 times a day 100 each 11   acetaminophen (TYLENOL) 325 MG tablet Take 2 tablets (650 mg total) by mouth every 6 (six) hours as needed for mild pain (or Fever >/= 101). 30 tablet 0   albuterol (VENTOLIN HFA) 108 (90 Base) MCG/ACT inhaler Inhale 2 puffs into the lungs every 6 (six) hours as needed for wheezing or shortness of breath. 8 g 0   B-D UF III MINI PEN NEEDLES 31G X 5 MM MISC USE 5 TIMES A DAY AS DIRECTED 500 each 1   blood glucose meter kit and supplies KIT Dispense based on patient and insurance preference. Use up to four times daily as directed. (FOR ICD-9 250.00, 250.01). 1 each 0   Blood Glucose Monitoring Suppl (ACCU-CHEK GUIDE) w/Device KIT 1 each by Does not apply route 4 (four) times daily. Check blood sugar 3 times a day 1 kit 1   Cholecalciferol 25 MCG (1000 UT) capsule Take 2  capsules (2,000 Units total) by mouth daily. 60 capsule 0   clindamycin (CLEOCIN T) 1 % lotion Apply in am     clindamycin-benzoyl peroxide (BENZACLIN) gel Apply topically 2 (two) times daily. 25 g 0   Continuous Blood Gluc Receiver (DEXCOM G6 RECEIVER) DEVI Use to check blood sugar at least 6 times a day 1 each 0   Dermatological Products, Misc. (NUVAIL) SOLN Apply to affected nails daily 15 mL 2   gabapentin (NEURONTIN) 300 MG capsule Take 1 capsule (300 mg total) by mouth at bedtime. 90 capsule 3   glucose blood (ACCU-CHEK GUIDE) test strip Check blood sugar 3 times daily. DIAG CODE E11.9. INSULIN DEPENDENT 100 strip 11   hyoscyamine (LEVSIN) 0.125 MG tablet TAKE 1 TABLET BY MOUTH 3 TIMES A DAY AS NEEDED 30 tablet 3   ibuprofen (ADVIL,MOTRIN) 200 MG tablet Take 800 mg by mouth every 6 (six) hours as needed for moderate pain.     Insulin Pen Needle (PEN NEEDLES) 32G X 5 MM MISC 1 each by Does not apply route 5 (five) times daily. 100 each 3   lansoprazole (PREVACID) 15 MG capsule TAKE 1 CAPSULE (15 MG TOTAL) BY MOUTH DAILY AT 12 NOON. 60 capsule 2   LANTUS SOLOSTAR 100 UNIT/ML Solostar Pen  INJECT 30 UNITS INTO THE SKIN DAILY AT 10 PM. 27 mL 2   loratadine (CLARITIN) 10 MG tablet Take 1 tablet (10 mg total) by mouth daily. 30 tablet 0   Multiple Vitamin (MULTIVITAMIN WITH MINERALS) TABS tablet Take 1 tablet by mouth daily.     NOVOLOG FLEXPEN 100 UNIT/ML FlexPen PER CORRECTION SCALE (UP TO 8 UNIT THREE TIMES A DAY) 15 mL 2   Probiotic Product (PROBIOTIC PO) Take 1 capsule by mouth daily.     promethazine-dextromethorphan (PROMETHAZINE-DM) 6.25-15 MG/5ML syrup Take 5 mLs by mouth 4 (four) times daily as needed for cough. 118 mL 0   rifampin (RIFADIN) 300 MG capsule Take 300 mg by mouth 2 (two) times daily.     rosuvastatin (CRESTOR) 10 MG tablet TAKE 1 TABLET BY MOUTH EVERY DAY 90 tablet 1   spironolactone (ALDACTONE) 25 MG tablet Take 1 tablet (25 mg total) by mouth daily. 60 tablet 2    [DISCONTINUED] budesonide (PULMICORT) 180 MCG/ACT inhaler Inhale 1 puff into the lungs 2 (two) times daily. 1 each 1   No current facility-administered medications on file prior to visit.    Family History  Problem Relation Age of Onset   Anesthesia problems Mother        difficult to arouse, SOB   Diabetes Mother    Hypertension Mother    Stroke Mother    Diabetes Father    Hypertension Father    Heart disease Father    Cancer Maternal Aunt    Cancer Maternal Grandmother    Breast cancer Maternal Grandmother    Breast cancer Cousin     Social History   Socioeconomic History   Marital status: Married    Spouse name: Not on file   Number of children: Not on file   Years of education: Not on file   Highest education level: Not on file  Occupational History   Not on file  Tobacco Use   Smoking status: Some Days    Packs/day: 0.25    Years: 4.00    Total pack years: 1.00    Types: Cigarettes   Smokeless tobacco: Never   Tobacco comments:    15 cig/day  Vaping Use   Vaping Use: Never used  Substance and Sexual Activity   Alcohol use: Not Currently    Comment: socially   Drug use: Not Currently    Types: Marijuana    Comment: last month   Sexual activity: Yes    Birth control/protection: Other-see comments, Surgical  Other Topics Concern   Not on file  Social History Narrative   Not on file   Social Determinants of Health   Financial Resource Strain: Not on file  Food Insecurity: Not on file  Transportation Needs: Not on file  Physical Activity: Not on file  Stress: Not on file  Social Connections: Not on file  Intimate Partner Violence: Not on file    Review of Systems: ROS negative except for what is noted on the assessment and plan.  Vitals:   03/26/22 1601 03/26/22 1605  BP:  (!) 186/116  Pulse:  85  Temp:  98.1 F (36.7 C)  TempSrc:  Oral  SpO2:  99%  Weight: (!) 370 lb 8 oz (168.1 kg)   Height: 5' 6.5" (1.689 m)     Physical  Exam: Constitutional: well-appearing obese female sitting in chair, in no acute distress Eyes: conjunctiva non-erythematous, PERRL Cardiovascular: regular rate and rhythm, no m/r/g Pulmonary/Chest: normal work of breathing on  room air, lungs clear to auscultation bilaterally Abdominal: soft, non-tender, obese abdomen. Normal bowel sounds MSK: normal bulk and tone Neurological: alert & oriented x 3, CN II-XII intact. Strength 5/5 bilateral upper and lower extremities. Normal finger-to-nose testing. EOMI and PERRL.  Skin: Dry skin on feet with calluses. Hyperpigmented macule on bottom of left heel (see image).  Psych: normal mood and behavior    Assessment & Plan:    Patient seen with Dr. Philipp Ovens  Hypertension The patient presents to the [facility] today to follow-up on her hypertension and diabetes.  The patient has not been seen in our clinic in over a year and a half and has been out of all of her medications for over 6 months.  Today her blood pressure is extremely elevated to 186/116.  At this time, she denies any headaches, dizziness, chest pain, palpitations, or shortness of breath.  She does endorse blurry vision, but she states that this is chronic and when she went to see an optometrist a while ago, she was told she needs glasses.  Although the patient does not have a headache right now, she states that there are periods of time where she will wake up in the middle of the night with an excruciating headache that is worse than her typical migraines. She will check her blood pressure during these episodes and she notes that her systolic blood pressure is sometimes in the 200s. The patient states that she usually just tries to lay down in a dark room when she gets these headaches. This has happened twice this month.  On exam the patient's conjunctive are nonerythematous and her pupils are equal round and reactive to light and EOMI. She has no focal deficit on neuro exam.  Of note, the patient  hopes to take care of her 4 adult children, 2 grandchildren, her parents, and her husband.  She states that she helps these people get to their appointments, however, this comes at the expense of not seeking care for herself.  We discussed the importance of strict follow-up for her blood pressure and diabetes, especially as these people rely on her and need her to be healthy.  Plan: - Restart lisinopril 40 mg daily - Restart amlodipine 10 mg daily - Restart chlorthalidone at 25 mg daily (was on 50 mg) - Hold spironolactone for day - BMP today - Recheck blood pressure in 2 weeks (recommend morning appointment to screen for hyperaldosteronism with renin to Aldo ratio) - I advised the patient to go to the emergency room if she has any more episodes of those excruciating headaches and if her blood pressure is greater than 180/110 at that time - Initiate secondary hypertension workup at next visit   Diabetes [facility]) Patient presents today to follow-up on her diabetes.  She has not been seen in our clinic in over a year and a half and has been out of all of her insulin and metformin for over 6 months. Her A1c previously was 9.3% and today it is 8.5%.  The patient denies any polyuria or polydipsia but does endorse blurry vision which is chronic (she has seen an optometrist and has been told she needs glasses).  Additionally the patient does endorse some numbness and tingling in her feet.  A diabetic foot exam was performed today and her sensation and pulses are intact, however, the patient does have a small area of hyperpigmentation which is tender to palpation.  I strongly recommended that the patient wears shoes with good support  and does not go barefoot, especially while outside. We will keep a close eye on this lesion, although her pain could also be secondary to plantar fasciitis and this could simply just be a bruise.   Plan: - Restart metformin; titrate up to 1 g bid - Holding off on restarting  insulin at this time - Start ozempic 0.25 mg weekly; will up-titrate this medication if the patient tolerates it well - Referral to ophthalmology  - Urine micro today  Plantar fasciitis of left foot The patient endorses having bilateral foot pain that is worse around her heel. Patient states that this started in both feet about 7 months ago, however, it is now only in her left foot.  She describes having heel pain that is worse with activity and standing. On exam, the patient does not have any cuts or ulcers, however, she does have an area of hyperpigmentation on the heel of her left foot (could be secondary to a bruise or blood), and is tender to palpation. The foot does not appear to be actively infected, and her symptoms appear to be consistent with plantar fasciitis.  Plan: - Plantar fasciitis stretches advised - If pain persists/worsens or if spot becomes larger, consider workup for diabetic foot wound   Maria Carpenter, D.O. [facility], PGY-2 Phone: [phone] Date 03/26/2022 Time 5:41 PM

## 2022-03-26 NOTE — Assessment & Plan Note (Signed)
Patient presents today to follow-up on her diabetes.  She has not been seen in our clinic in over a year and a half and has been out of all of her insulin and metformin for over 6 months. Her A1c previously was 9.3% and today it is 8.5%.  The patient denies any polyuria or polydipsia but does endorse blurry vision which is chronic (she has seen an optometrist and has been told she needs glasses).  Additionally the patient does endorse some numbness and tingling in her feet.  A diabetic foot exam was performed today and her sensation and pulses are intact, however, the patient does have a small area of hyperpigmentation which is tender to palpation.  I strongly recommended that the patient wears shoes with good support and does not go barefoot, especially while outside. We will keep a close eye on this lesion, although her pain could also be secondary to plantar fasciitis and this could simply just be a bruise.   Plan: - Restart metformin; titrate up to 1 g bid - Holding off on restarting insulin at this time - Start ozempic 0.25 mg weekly; will up-titrate this medication if the patient tolerates it well - Referral to ophthalmology  - Urine micro today

## 2022-03-26 NOTE — Assessment & Plan Note (Signed)
The patient endorses having bilateral foot pain that is worse around her heel. Patient states that this started in both feet about 7 months ago, however, it is now only in her left foot.  She describes having heel pain that is worse with activity and standing. On exam, the patient does not have any cuts or ulcers, however, she does have an area of hyperpigmentation on the heel of her left foot (could be secondary to a bruise or blood), and is tender to palpation. The foot does not appear to be actively infected, and her symptoms appear to be consistent with plantar fasciitis.  Plan: - Plantar fasciitis stretches advised - If pain persists/worsens or if spot becomes larger, consider workup for diabetic foot wound

## 2022-03-26 NOTE — Assessment & Plan Note (Addendum)
The patient presents to the [facility] today to follow-up on her hypertension and diabetes.  The patient has not been seen in our clinic in over a year and a half and has been out of all of her medications for over 6 months.  Today her blood pressure is extremely elevated to 186/116.  At this time, she denies any headaches, dizziness, chest pain, palpitations, or shortness of breath.  She does endorse blurry vision, but she states that this is chronic and when she went to see an optometrist a while ago, she was told she needs glasses.  Although the patient does not have a headache right now, she states that there are periods of time where she will wake up in the middle of the night with an excruciating headache that is worse than her typical migraines. She will check her blood pressure during these episodes and she notes that her systolic blood pressure is sometimes in the 200s. The patient states that she usually just tries to lay down in a dark room when she gets these headaches. This has happened twice this month.  On exam the patient's conjunctive are nonerythematous and her pupils are equal round and reactive to light and EOMI. She has no focal deficit on neuro exam.  Of note, the patient hopes to take care of her 4 adult children, 2 grandchildren, her parents, and her husband.  She states that she helps these people get to their appointments, however, this comes at the expense of not seeking care for herself.  We discussed the importance of strict follow-up for her blood pressure and diabetes, especially as these people rely on her and need her to be healthy.  Plan: - Restart lisinopril 40 mg daily - Restart amlodipine 10 mg daily - Restart chlorthalidone at 25 mg daily (was on 50 mg) - Hold spironolactone for day - BMP today - Recheck blood pressure in 2 weeks (recommend morning appointment to screen for hyperaldosteronism with renin to Aldo ratio) - I advised the patient to go to the emergency room if  she has any more episodes of those excruciating headaches and if her blood pressure is greater than 180/110 at that time - Initiate secondary hypertension workup at next visit

## 2022-03-26 NOTE — Patient Instructions (Signed)
Thank you, [name] for allowing [place] to provide your care today. Today we discussed:  High blood pressure: If you continue to have severe headaches and if your blood pressure is very high (>180/110) please go to the emergency room. We are going to restart some of your medications: Please take lisinopril 40 mg daily Please take amlodipine 10 mg daily Please take chlorthalidone (similar to HCTZ) 25 mg daily We are going to recheck your blood pressure in 2 weeks and if it is still high, we will increase some of those dosages to get your blood pressure down. We do not want to drop your blood pressure too quickly  Diabetes: Your A1c is 8.5% (goal is 7%). Please start taking metformin 500 mg twice a day and if you tolerate this medication well (no nausea, vomiting), you can take 1000 mg twice a day.  Please also start Ozempic 0.25 mg. This is a once a week injection for diabetes and also helps with weight loss We are going to hold off on restarting insulin at this time I also sent in a new meter for you, so please bring this in next office visit so we can see how your blood sugars are doing  I have ordered the following labs for you:   Lab Orders         BMP8+Anion Gap         Microalbumin / Creatinine Urine Ratio         Glucose, capillary         POC Hbg A1C       Referrals ordered today:    Referral Orders         Ambulatory referral to Ophthalmology      I have ordered the following medication/changed the following medications:   Stop the following medications: Medications Discontinued During This Encounter  Medication Reason   clindamycin (CLEOCIN) 300 MG capsule Expired Prescription   propranolol (INDERAL) 20 MG tablet Reorder   Continuous Blood Gluc Sensor (DEXCOM G6 SENSOR) MISC Reorder   Continuous Blood Gluc Transmit (DEXCOM G6 TRANSMITTER) MISC Reorder   chlorthalidone (HYGROTON) 50 MG tablet Reorder   lisinopril (ZESTRIL) 40 MG tablet Reorder   metFORMIN  (GLUCOPHAGE) 1000 MG tablet Reorder   amLODipine (NORVASC) 10 MG tablet Reorder     Start the following medications: Meds ordered this encounter  Medications   propranolol (INDERAL) 20 MG tablet    Sig: Take 2 tablets (40 mg total) by mouth 2 (two) times daily.    Dispense:  120 tablet    Refill:  3   amLODipine (NORVASC) 10 MG tablet    Sig: Take 1 tablet (10 mg total) by mouth daily.    Dispense:  90 tablet    Refill:  1   chlorthalidone (HYGROTON) 25 MG tablet    Sig: Take 1 tablet (25 mg total) by mouth daily.    Dispense:  30 tablet    Refill:  0   lisinopril (ZESTRIL) 40 MG tablet    Sig: Take 1 tablet (40 mg total) by mouth daily.    Dispense:  90 tablet    Refill:  3    DX Code Needed  .   metFORMIN (GLUCOPHAGE) 1000 MG tablet    Sig: Take 0.5 tablets (500 mg total) by mouth 2 (two) times daily with a meal for 7 days, THEN 1 tablet (1,000 mg total) 2 (two) times daily with a meal.    Dispense:  180 tablet  Refill:  3   Continuous Blood Gluc Transmit (DEXCOM G6 TRANSMITTER) MISC    Sig: Use to check blood sugar at least 6 times a day    Dispense:  1 each    Refill:  3   Continuous Blood Gluc Sensor (DEXCOM G6 SENSOR) MISC    Sig: Check blood sugar at least 6 times a day    Dispense:  3 each    Refill:  11   Semaglutide,0.25 or 0.5MG /DOS, 2 MG/3ML SOPN    Sig: Inject 0.25 mg into the skin once a week.    Dispense:  3 mL    Refill:  1     Follow up:  2 week (AM appointment) for blood pressure and diabetes follow up     Remember: If you develop a severe headache (worse than your usual migraines), please check your BP. If it is high, as we discussed, please go to the emergency department.  Should you have any questions or concerns please call the internal medicine clinic at [phone].     Elza Rafter, D.O. [facility]

## 2022-03-27 LAB — MICROALBUMIN / CREATININE URINE RATIO
Creatinine, Urine: 294 mg/dL
Microalb/Creat Ratio: 45 mg/g creat — ABNORMAL HIGH (ref 0–29)
Microalbumin, Urine: 132.2 ug/mL

## 2022-03-27 LAB — BMP8+ANION GAP
Anion Gap: 17 mmol/L (ref 10.0–18.0)
BUN/Creatinine Ratio: 13 (ref 9–23)
BUN: 9 mg/dL (ref 6–24)
CO2: 21 mmol/L (ref 20–29)
Calcium: 9.6 mg/dL (ref 8.7–10.2)
Chloride: 99 mmol/L (ref 96–106)
Creatinine, Ser: 0.68 mg/dL (ref 0.57–1.00)
Glucose: 162 mg/dL — ABNORMAL HIGH (ref 70–99)
Potassium: 4.1 mmol/L (ref 3.5–5.2)
Sodium: 137 mmol/L (ref 134–144)
eGFR: 112 mL/min/{1.73_m2} (ref >59)

## 2022-03-27 NOTE — Progress Notes (Signed)
Internal Medicine Clinic Attending ° °Case discussed with Dr. Atway  At the time of the visit.  We reviewed the resident’s history and exam and pertinent patient test results.  I agree with the assessment, diagnosis, and plan of care documented in the resident’s note.  °

## 2022-03-28 ENCOUNTER — Telehealth: Payer: Self-pay

## 2022-03-28 NOTE — Telephone Encounter (Signed)
Pa  for pt ( DEXCOM G6 SENSOR )  came through  on cover my meds .[name] Was submitted with last office notes and labs ,,, awaiting approval or denial ..     UPDATE :  DECISION :  Approved today  Request Reference Number: [id number].   DEXCOM G6 MIS SENSOR is approved through 09/28/2022.   For further questions, call [employer] at [phone].   Drug Dexcom G6 Sensor   Form [employer] Electronic Prior Authorization Form [phone] NCPDP)     ( COPY SENT TO PHARMACY ALSO )

## 2022-04-02 ENCOUNTER — Telehealth: Payer: Self-pay

## 2022-04-02 NOTE — Telephone Encounter (Signed)
Pa for pt ( DEXCOM G6 TRANSMITTER )  came through on cover my meds was submitted  decision came back right away  and was sent to pharmacy .[name]      DECISION :   Out come Additional Information Required  We received a prior authorization request for the member and product listed above. The Community and [facility] Prior Authorization Team is not able to review this request because the requested medication has been previously approved under [id number]. Based on the information reviewed, the requested prescription is currently authorized for coverage by the plan until 2022-09-28. Please resubmit this request within 30 days of authorization expiration date.  Drug Dexcom G6 Transmitter  Form [employer] Electronic Prior Authorization Form [phone] NCPDP)

## 2022-04-09 ENCOUNTER — Encounter: Payer: Medicaid Other | Admitting: Internal Medicine

## 2022-04-09 ENCOUNTER — Encounter: Payer: Self-pay | Admitting: Internal Medicine

## 2022-04-10 ENCOUNTER — Encounter: Payer: Self-pay | Admitting: Student

## 2022-04-26 ENCOUNTER — Other Ambulatory Visit: Payer: Self-pay | Admitting: Internal Medicine

## 2022-04-26 DIAGNOSIS — I10 Essential (primary) hypertension: Secondary | ICD-10-CM

## 2022-05-27 ENCOUNTER — Other Ambulatory Visit: Payer: Self-pay | Admitting: Internal Medicine

## 2022-05-27 DIAGNOSIS — E1169 Type 2 diabetes mellitus with other specified complication: Secondary | ICD-10-CM

## 2022-05-30 ENCOUNTER — Ambulatory Visit (INDEPENDENT_AMBULATORY_CARE_PROVIDER_SITE_OTHER): Payer: Medicaid Other | Admitting: Internal Medicine

## 2022-05-30 VITALS — BP 192/118 | HR 100 | Temp 98.2°F | Wt 354.5 lb

## 2022-05-30 DIAGNOSIS — E1169 Type 2 diabetes mellitus with other specified complication: Secondary | ICD-10-CM

## 2022-05-30 DIAGNOSIS — E559 Vitamin D deficiency, unspecified: Secondary | ICD-10-CM | POA: Diagnosis not present

## 2022-05-30 DIAGNOSIS — I1A0 Resistant hypertension: Secondary | ICD-10-CM | POA: Diagnosis not present

## 2022-05-30 DIAGNOSIS — R109 Unspecified abdominal pain: Secondary | ICD-10-CM

## 2022-05-30 MED ORDER — GABAPENTIN 300 MG PO CAPS: 300.0000 mg | ORAL_CAPSULE | Freq: Every day | ORAL | 3 refills | Status: AC

## 2022-05-30 MED ORDER — SEMAGLUTIDE(0.25 OR 0.5MG/DOS) 2 MG/3ML ~~LOC~~ SOPN: 0.2500 mg | PEN_INJECTOR | SUBCUTANEOUS | 1 refills | Status: DC

## 2022-05-30 NOTE — Progress Notes (Signed)
CC: abdominal pain  HPI:  [name] is a 41 y.o. person with past medical history as detailed below who presents today with severe abdominal pain. Please see problem based charting for detailed assessment and plan.  Past Medical History:  Diagnosis Date   Anemia    Asthma    Chlamydia 2012   treated   Complication of anesthesia    " they have trouble waking me up"   Diabetes mellitus    A2DM during pregnancy. now on metformin.    Dyspnea 05/07/2019   Dysuria 04/11/2019   Family history of anesthesia complication    mother & daughter    GERD (gastroesophageal reflux disease)    Gonorrhea 2010   treated   Hypertension    Lupus ([place]) 10-2013   Morbid obesity ([place]) 07/22/2011   Pregnancy induced hypertension    pre E with pregnancies   Rheumatoid arthritis(714.0)    Shortness of breath    Sleep apnea    does not use cpap   Stroke [facility]) 2012   March 2012>right side deficit (speech, upper/ower weakness)   SVT (supraventricular tachycardia) ([place])    Review of Systems:  Negative unless otherwise stated.  Physical Exam:  Vitals:   05/30/22 1552 05/30/22 1616  BP: (!) 191/134 (!) 192/118  Pulse: 97 100  Temp: 98.2 F (36.8 C)   TempSrc: Oral   SpO2: 100%   Weight: (!) 354 lb 8 oz (160.8 kg)    Constitutional:Appears stated age. Tearful, holding stomach while seated. Cardio:Regular rate and rhythm. No murmurs, rubs, or gallops. Pulm:Clear to auscultation bilaterally. Normal work of breathing on room air.: Abdomen:Soft, non-distended, minimal tenderness to palpation diffusely.: XTK:WIOXBDZH for extremity edema. Skin:Warm and dry. Neuro:Alert and oriented x3. No focal deficit noted. Psych:Pleasant mood and affect.   Assessment & Plan:   See Encounters Tab for problem based charting.  Resistant hypertension BP severely elevated today at 192/118. Patient endorses compliance with anti-hypertensive regimen--amlodipine 10 mg daily, chlorthalidone 25 mg  daily, lisinopril 40 mg daily. She denies chest pain, vision changes, weakness, or confusion. She states that this elevated BP is normal for her. I do not see that additional work up for secondary hypertension has been done given concerns about medication compliance. She does have a history of renal artery stenosis and OSA which certainly could be contributing to her uncontrolled hypertension in addition to medication issues, however I feel it to be appropriate to rule out all possible causes of this severe hypertension. Plan:Continue current medications. Will obtain CMP today. Will also check plasma aldosterone and renin levels.  Abdominal pain Patient complaints of debilitating abdominal pain that has increased in frequency over the last one year to a frequency now of multiple times daily. She is unable to eat or drink without significant pain, with even small sips of water causing significant distress. She says that she does have black, tarry stool 1-2 times per week but has not observed frank hematochezia or melena. Some episodes of the pain are associated with yellow, floating diarrhea. The pain is so severe that she often skips meals. She does endorse a history of IBS but these symptoms are very different than those that she previously has had with her IBS. Assessment:Given her severely uncontrolled hypertension and pain out of proportion to exam findings I am worried about mesenteric ischemia associated with her hypertension. Will order stat CTA of the abdomen for further assessment.  Diabetes [facility]) Patient is currently taking ozempic, 0.25 mg weekly for  her diabetes. She states that her fasting blood sugars range from 80s-100s but does acknowledge that she is not eating very much, if at all, due to severe abdominal pain. She is no longer taking metformin due to GI upset which has been further exacerbated over the last several months.  Plan:Refill ozempic, gabapentin. It is too soon to recheck HbA1c  so dfwill check that at next OV. If she continues to tolerate this well, can increase the dose at next OV.   Vitamin D deficiency Patient is not regularly taking vitamin D supplementation. She endorses changes to her hair and nails that include thinning, slow growth, and shedding. Plan:Check vitamin D level today.  Patient discussed with Dr. Mayford Knife

## 2022-05-30 NOTE — Patient Instructions (Signed)
Ms. Aristizabal,  It was a pleasure to care for you today. I'm sorry that you are not feeling well!  I will let you know what your lab results show. I have also ordered imaging of your stomach and you will get a call to schedule this.  If your symptoms get any worse, please go to the closest emergency department for further evaluation.  I would like you to return for follow up in about 1 month.  My best, Dr. Marlou Sa

## 2022-06-01 ENCOUNTER — Other Ambulatory Visit: Payer: Self-pay | Admitting: Internal Medicine

## 2022-06-01 DIAGNOSIS — R109 Unspecified abdominal pain: Secondary | ICD-10-CM | POA: Insufficient documentation

## 2022-06-01 DIAGNOSIS — E559 Vitamin D deficiency, unspecified: Secondary | ICD-10-CM

## 2022-06-01 MED ORDER — CHOLECALCIFEROL 25 MCG (1000 UT) PO CAPS: 1000.0000 [IU] | ORAL_CAPSULE | Freq: Every day | ORAL | 2 refills | Status: DC

## 2022-06-01 NOTE — Assessment & Plan Note (Signed)
Patient complaints of debilitating abdominal pain that has increased in frequency over the last one year to a frequency now of multiple times daily. She is unable to eat or drink without significant pain, with even small sips of water causing significant distress. She says that she does have black, tarry stool 1-2 times per week but has not observed frank hematochezia or melena. Some episodes of the pain are associated with yellow, floating diarrhea. The pain is so severe that she often skips meals. She does endorse a history of IBS but these symptoms are very different than those that she previously has had with her IBS. Assessment:Given her severely uncontrolled hypertension and pain out of proportion to exam findings I am worried about mesenteric ischemia associated with her hypertension. Will order stat CTA of the abdomen for further assessment.

## 2022-06-01 NOTE — Assessment & Plan Note (Addendum)
Patient is currently taking ozempic, 0.25 mg weekly for her diabetes. She states that her fasting blood sugars range from 80s-100s but does acknowledge that she is not eating very much, if at all, due to severe abdominal pain. She is no longer taking metformin due to GI upset which has been further exacerbated over the last several months.  Plan:Refill ozempic, gabapentin. It is too soon to recheck HbA1c so dfwill check that at next OV. If she continues to tolerate this well, can increase the dose at next OV.

## 2022-06-01 NOTE — Assessment & Plan Note (Addendum)
BP severely elevated today at 192/118. Patient endorses compliance with anti-hypertensive regimen--amlodipine 10 mg daily, chlorthalidone 25 mg daily, lisinopril 40 mg daily. She denies chest pain, vision changes, weakness, or confusion. She states that this elevated BP is normal for her. I do not see that additional work up for secondary hypertension has been done given concerns about medication compliance. She does have a history of renal artery stenosis and OSA which certainly could be contributing to her uncontrolled hypertension in addition to medication issues, however I feel it to be appropriate to rule out all possible causes of this severe hypertension. Plan:Continue current medications. Will obtain CMP today. Will also check plasma aldosterone and renin levels.

## 2022-06-01 NOTE — Assessment & Plan Note (Signed)
Patient is not regularly taking vitamin D supplementation. She endorses changes to her hair and nails that include thinning, slow growth, and shedding. Plan:Check vitamin D level today.

## 2022-06-03 LAB — CBC
Hematocrit: 36.5 % (ref 34.0–46.6)
Hemoglobin: 11.9 g/dL (ref 11.1–15.9)
MCH: 27.8 pg (ref 26.6–33.0)
MCHC: 32.6 g/dL (ref 31.5–35.7)
MCV: 85 fL (ref 79–97)
Platelets: 219 10*3/uL (ref 150–450)
RBC: 4.28 x10E6/uL (ref 3.77–5.28)
RDW: 14.5 % (ref 11.7–15.4)
WBC: 7.6 10*3/uL (ref 3.4–10.8)

## 2022-06-03 LAB — CMP14 + ANION GAP
ALT: 11 IU/L (ref 0–32)
AST: 15 IU/L (ref 0–40)
Albumin/Globulin Ratio: 1.3 (ref 1.2–2.2)
Albumin: 4 g/dL (ref 3.9–4.9)
Alkaline Phosphatase: 89 IU/L (ref 44–121)
Anion Gap: 14 mmol/L (ref 10.0–18.0)
BUN/Creatinine Ratio: 14 (ref 9–23)
BUN: 12 mg/dL (ref 6–24)
Bilirubin Total: 0.3 mg/dL (ref 0.0–1.2)
CO2: 24 mmol/L (ref 20–29)
Calcium: 9.7 mg/dL (ref 8.7–10.2)
Chloride: 100 mmol/L (ref 96–106)
Creatinine, Ser: 0.83 mg/dL (ref 0.57–1.00)
Globulin, Total: 3.1 g/dL (ref 1.5–4.5)
Glucose: 141 mg/dL — ABNORMAL HIGH (ref 70–99)
Potassium: 3.8 mmol/L (ref 3.5–5.2)
Sodium: 138 mmol/L (ref 134–144)
Total Protein: 7.1 g/dL (ref 6.0–8.5)
eGFR: 91 mL/min/{1.73_m2} (ref >59)

## 2022-06-03 LAB — VITAMIN D 25 HYDROXY (VIT D DEFICIENCY, FRACTURES): Vit D, 25-Hydroxy: 12 ng/mL — ABNORMAL LOW (ref 30.0–100.0)

## 2022-06-03 LAB — ALDOSTERONE + RENIN ACTIVITY W/ RATIO
Aldos/Renin Ratio: 4.5 (ref 0.0–30.0)
Aldosterone: 4.7 ng/dL (ref 0.0–30.0)
Renin Activity, Plasma: 1.043 ng/mL/hr (ref 0.167–5.380)

## 2022-06-06 ENCOUNTER — Telehealth: Payer: Self-pay | Admitting: Internal Medicine

## 2022-06-06 NOTE — Telephone Encounter (Signed)
Attempted to contact patient to review remainder of lab results from recent [place] however she did not answer. I left a message letting her know to give the clinic a call when she gets the chance.  Farrel Gordon, DO

## 2022-06-08 ENCOUNTER — Telehealth: Payer: Self-pay | Admitting: Internal Medicine

## 2022-06-08 ENCOUNTER — Encounter: Payer: Self-pay | Admitting: Internal Medicine

## 2022-06-08 MED ORDER — SPIRONOLACTONE 25 MG PO TABS: 25.0000 mg | ORAL_TABLET | Freq: Every day | ORAL | 11 refills | Status: DC

## 2022-06-08 MED ORDER — VITAMIN D (ERGOCALCIFEROL) 1.25 MG (50000 UNIT) PO CAPS: 50000.0000 [IU] | ORAL_CAPSULE | ORAL | 0 refills | Status: AC

## 2022-06-08 NOTE — Telephone Encounter (Signed)
Again attempted to contact patient regarding remaining lab results from recent OV with no answer. I left a message for her to call the clinic back.   I will also send a message on MyChart (last log in was 5 days ago) to convey the information.  Farrel Gordon, DO

## 2022-06-10 NOTE — Progress Notes (Signed)
Internal Medicine Clinic Attending ? ?Case discussed with Dr. Dean  At the time of the visit.  We reviewed the resident?s history and exam and pertinent patient test results.  I agree with the assessment, diagnosis, and plan of care documented in the resident?s note.  ?

## 2022-06-12 ENCOUNTER — Telehealth: Payer: Self-pay | Admitting: Internal Medicine

## 2022-06-12 NOTE — Telephone Encounter (Signed)
Discussed changes in treatment plan for vitamin D deficiency and hypertension with Maria Carpenter, which included changing from vitamin D 1000 units daily to 50,000 units weekly for 8 weeks without daily supplementation, then transitioning to daily as above, in addition to adding spironolactone to her anti-hypertensive regimen. She voiced understanding and had no questions at this time.  Farrel Gordon, DO

## 2022-06-22 NOTE — Telephone Encounter (Signed)
Called patient regarding her message.  She states that her blood sugars have been running into the 160s to 180s, fasting in the morning.  She states that she was recently taken off metformin given that she has been having GI side effects, and is due for CT scan of abdomen that is scheduled for June 25, 2022.  She states that she wanted to see if she can go up on her Ozempic given that her blood glucose levels have been elevated.  I speak to her on the phone, stating that she is due for A1c on June 26, 2022.  She is agreeable to coming in to be seen.  Patient has been scheduled for 06/28/2022 at 315pm with Dr.Jinwala.  Patient will discuss diabetes management at that time, and hopefully CT scan results will be read by then.

## 2022-06-25 ENCOUNTER — Ambulatory Visit (HOSPITAL_COMMUNITY): Admission: RE | Admit: 2022-06-25 | Payer: Medicaid Other | Source: Ambulatory Visit

## 2022-06-28 ENCOUNTER — Encounter: Payer: Medicaid Other | Admitting: Student

## 2022-07-17 ENCOUNTER — Other Ambulatory Visit: Payer: Self-pay | Admitting: Internal Medicine

## 2022-07-17 ENCOUNTER — Other Ambulatory Visit: Payer: Self-pay | Admitting: Student

## 2022-07-17 DIAGNOSIS — K219 Gastro-esophageal reflux disease without esophagitis: Secondary | ICD-10-CM

## 2022-07-17 DIAGNOSIS — I10 Essential (primary) hypertension: Secondary | ICD-10-CM

## 2022-11-11 ENCOUNTER — Other Ambulatory Visit: Payer: Self-pay | Admitting: Nurse Practitioner

## 2022-11-12 ENCOUNTER — Telehealth: Payer: Self-pay

## 2022-11-12 NOTE — Telephone Encounter (Signed)
Decision:Denied  

## 2022-11-12 NOTE — Telephone Encounter (Signed)
Prior Authorization for patient (Dex[employer] G6 Sensor/Transmitter) came through on cover my meds was submitted with last office notes and labs awaiting approval or denial

## 2023-01-04 ENCOUNTER — Encounter: Payer: Self-pay | Admitting: Student

## 2023-04-30 ENCOUNTER — Encounter: Payer: Self-pay | Admitting: Student

## 2023-08-12 ENCOUNTER — Emergency Department (HOSPITAL_BASED_OUTPATIENT_CLINIC_OR_DEPARTMENT_OTHER): Payer: Medicaid Other | Admitting: Radiology

## 2023-08-12 ENCOUNTER — Emergency Department (HOSPITAL_BASED_OUTPATIENT_CLINIC_OR_DEPARTMENT_OTHER)
Admission: EM | Admit: 2023-08-12 | Discharge: 2023-08-12 | Disposition: A | Discharge status: Home / Self Care | Payer: Medicaid Other | Attending: Emergency Medicine | Admitting: Emergency Medicine

## 2023-08-12 DIAGNOSIS — M25562 Pain in left knee: Secondary | ICD-10-CM | POA: Diagnosis not present

## 2023-08-12 DIAGNOSIS — I10 Essential (primary) hypertension: Secondary | ICD-10-CM | POA: Diagnosis not present

## 2023-08-12 DIAGNOSIS — Z794 Long term (current) use of insulin: Secondary | ICD-10-CM | POA: Diagnosis not present

## 2023-08-12 DIAGNOSIS — Z79899 Other long term (current) drug therapy: Secondary | ICD-10-CM | POA: Insufficient documentation

## 2023-08-12 DIAGNOSIS — M25462 Effusion, left knee: Secondary | ICD-10-CM | POA: Diagnosis not present

## 2023-08-12 MED ORDER — ACETAMINOPHEN 325 MG PO TABS
650.0000 mg | ORAL_TABLET | Freq: Once | ORAL | Status: AC
  Administered 2023-08-12: 650 mg via ORAL
  Filled 2023-08-12: qty 2

## 2023-08-12 NOTE — ED Notes (Signed)
Provider aware of the Pt's V/S and cleared DC.Marland KitchenMarland Kitchen

## 2023-08-12 NOTE — ED Notes (Signed)
Ace wrap applied x2 to the left knee. Provider informed.Marland KitchenMarland Kitchen

## 2023-08-12 NOTE — ED Triage Notes (Addendum)
Left knee pain since Thanksgiving- childhood injury in same. Reports swelling and pain.   Hypertensive in triage. Reports this is a normal pressure for her when not taking meds- has not taking today's dose. Denies CP, SOB, vision changes.

## 2023-08-12 NOTE — ED Notes (Signed)
Discharge paperwork given and verbally understood. 

## 2023-08-12 NOTE — Discharge Instructions (Addendum)
Please follow-up with your primary care provider in regards recent ER visit.  You may take Tylenol every 6 hours as needed for pain, keep the knee brace on, ice 3-4 times daily for 10 to 15 minutes at a time.  Today your x-ray does not show any fractures or abnormalities however you may still have a sprain.  If symptoms do not improve I have given you an orthopedist to follow-up with.  If symptoms change or worsen please return to the ER.  Today your blood pressure was also elevated today.  We discussed possibility of labs however you declined at this time.  Please take your blood pressure medications as prescribed no symptoms change or worsen please return to the ER.

## 2023-08-12 NOTE — ED Provider Notes (Signed)
[place] EMERGENCY DEPARTMENT AT [facility] Provider Note   CSN: [id number] Arrival date & time: 08/12/23  1659     History  Chief Complaint  Patient presents with   Knee Pain    left    Maria Carpenter is a 42 y.o. female history of stroke, hypertension, left renal artery stenosis presented with left knee pain since Thanksgiving.  Patient states that she was working all of a sudden she felt her knee began to hurt on Thanksgiving.  Patient is taken Tylenol with no relief.  Patient able to walk and denies fevers, redness to the area but does state it feels swollen.  Patient states that it does hurt to move her leg however she is able to move her leg.  Patient is endorsing that she can still feel her foot.  Patient still notes that her blood pressure is 226 systolic and states that this is normal for her if she misses her medications in which she did this morning.  Patient denies chest pain, shortness of breath, vision changes, headache and states that she does not want to be worked up for this as she just needs to go home and take her medications.  Patient denies any trauma to the knee, fevers, discharge, history of gout.  Home Medications Prior to Admission medications   Medication Sig Start Date End Date Taking? Authorizing Provider  Accu-Chek Softclix Lancets lancets Check blood sugar 3 times a day 11/15/20   Elige Radon, MD  acetaminophen (TYLENOL) 325 MG tablet Take 2 tablets (650 mg total) by mouth every 6 (six) hours as needed for mild pain (or Fever >/= 101). 08/31/18   Regalado, Belkys A, MD  albuterol (VENTOLIN HFA) 108 (90 Base) MCG/ACT inhaler Inhale 2 puffs into the lungs every 6 (six) hours as needed for wheezing or shortness of breath. 08/26/21   Daphine Deutscher, Mary-Margaret, FNP  amLODipine (NORVASC) 10 MG tablet Take 1 tablet (10 mg total) by mouth daily. 03/26/22   Atway, Rayann N, DO  B-D UF III MINI PEN NEEDLES 31G X 5 MM MISC USE 5 TIMES A DAY AS DIRECTED  04/18/20   Masoudi, Elhamalsadat, MD  Blood Glucose Monitoring Suppl (ACCU-CHEK GUIDE) w/Device KIT 1 each by Does not apply route 4 (four) times daily. Check blood sugar 3 times a day 11/15/20   Elige Radon, MD  chlorthalidone (HYGROTON) 25 MG tablet TAKE 1 TABLET (25 MG TOTAL) BY MOUTH DAILY. 07/20/22   Modena Slater, DO  Cholecalciferol 25 MCG (1000 UT) capsule Take 1 capsule (1,000 Units total) by mouth daily. 06/01/22   Champ Mungo, DO  Continuous Blood Gluc Receiver (DEXCOM G6 RECEIVER) DEVI Use to check blood sugar at least 6 times a day 04/18/20   Masoudi, Shawna Orleans, MD  Continuous Blood Gluc Sensor (DEXCOM G6 SENSOR) MISC Check blood sugar at least 6 times a day 03/26/22   Atway, Rayann N, DO  Continuous Blood Gluc Transmit (DEXCOM G6 TRANSMITTER) MISC Use to check blood sugar at least 6 times a day 03/26/22   Atway, Rayann N, DO  gabapentin (NEURONTIN) 300 MG capsule Take 1 capsule (300 mg total) by mouth at bedtime. 05/30/22   Champ Mungo, DO  glucose blood (ACCU-CHEK GUIDE) test strip Check blood sugar 3 times daily. 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    [provider]  Insulin Pen Needle (PEN NEEDLES) 32G X 5 MM MISC 1 each by Does not apply route 5 (five) times daily. 07/16/19   Masoudi, Elhamalsadat, MD  lisinopril (ZESTRIL) 40 MG tablet Take 1 tablet (40 mg total) by mouth daily. 03/26/22   Atway, Derwood Kaplan, DO  Multiple Vitamin (MULTIVITAMIN WITH MINERALS) TABS tablet Take 1 tablet by mouth daily.    [provider]  Probiotic Product (PROBIOTIC PO) Take 1 capsule by mouth daily.    [provider]  propranolol (INDERAL) 20 MG tablet TAKE 2 TABLETS BY MOUTH 2 TIMES DAILY. 07/20/22   Modena Slater, DO   rosuvastatin (CRESTOR) 10 MG tablet TAKE 1 TABLET BY MOUTH EVERY DAY 11/30/20   Rehman, Areeg N, DO  Semaglutide,0.25 or 0.5MG /DOS, 2 MG/3ML SOPN Inject 0.25 mg into the skin once a week. 05/30/22   Champ Mungo, DO  spironolactone (ALDACTONE) 25 MG tablet Take 1 tablet (25 mg total) by mouth daily. 06/08/22 06/08/23  Champ Mungo, DO  Vitamin D, Ergocalciferol, (DRISDOL) 1.25 MG (50000 UNIT) CAPS capsule Take 1 capsule (50,000 Units total) by mouth every 7 (seven) days. 06/08/22   Champ Mungo, DO  budesonide (PULMICORT) 180 MCG/ACT inhaler Inhale 1 puff into the lungs 2 (two) times daily. 05/14/19 05/18/19  Lorenso Courier, MD      Allergies    Amoxicillin, Cashew nut (anacardium occidentale) skin test, Dilaudid [hydromorphone hcl], and Egg-derived products    Review of Systems   Review of Systems  Physical Exam Updated Vital Signs BP (!) 229/122 (BP Location: Right Arm)   Pulse 89   Temp 98.3 F (36.8 C) (Oral)   Resp 16   SpO2 100%  Physical Exam Vitals reviewed.  Constitutional:      General: She is not in acute distress. Cardiovascular:     Rate and Rhythm: Normal rate.     Pulses: Normal pulses.  Musculoskeletal:     Comments: Tender to left patellar tendon with palpation however tendon is intact Intact quadricep tendon on the left side Patella nontender to palpation No calf tenderness or leg swelling noted Left knee does appear mildly edematous when compared to right however no overlying skin color changes or warmth noted Pain not out of proportion Soft compartments  Skin:    General: Skin is warm and dry.     Capillary Refill: Capillary refill takes less than 2 seconds.     Comments: No overlying skin color changes  Neurological:     Mental Status: She is alert.     Comments: Sensation intact distally Able to bear weight and walk without limp  Psychiatric:        Mood and Affect: Mood normal.     ED Results / Procedures / Treatments   Labs (all labs ordered are  listed, but only abnormal results are displayed) Labs Reviewed - No data to display  EKG None  Radiology DG Knee Complete 4 Views Left Result Date: 08/12/2023 CLINICAL DATA:  Left knee pain since Thanksgiving with swelling EXAM: LEFT KNEE - COMPLETE 4+ VIEW COMPARISON:  None Available. FINDINGS: No acute fracture or dislocation. Mild medial compartment narrowing. Small knee joint effusion. IMPRESSION: Small knee joint effusion. No acute fracture. Electronically Signed   By: Minerva Fester M.D.   On: 08/12/2023 19:52    Procedures Procedures    Medications Ordered in ED Medications  acetaminophen (  TYLENOL) tablet 650 mg (650 mg Oral Given 08/12/23 2016)    ED Course/ Medical Decision Making/ A&P                                 Medical Decision Making Amount and/or Complexity of Data Reviewed Radiology: ordered.  Risk OTC drugs.   JALEIYAH BERSTLER 42 y.o. presented today for left knee pain, hypertension. Working DDx that I considered at this time includes, but not limited to, contusion, strain/sprain, fracture, dislocation, neurovascular compromise, septic joint, ischemic limb, compartment syndrome, hypertensive urgency/emergency, stroke, CVA/TIA, AKI.  R/o DDx: fracture, dislocation, neurovascular compromise, septic joint, ischemic limb, compartment syndrome, contusion: These are considered less likely due to history of present illness, physical exam, labs/imaging findings.  Unable to assess patient for hypertensive urgency/emergency, CVA associated, AKI due to refusing labs.  Review of prior external notes: 11/12/2022 telephone  Unique Tests and My Interpretation:  Left knee x-ray: No acute findings  Social Determinants of Health: none  Discussion with Independent Historian: None  Discussion of Management of Tests: None  Risk: Medium: prescription drug management  Risk Stratification Score: None  Staffed with Pickering, MD  Plan: On exam patient was in no  acute distress but was noted to be hypertensive at 226/119.  Upon chart review patient does live in the high 180s to low 190s and states that this is normal for her and that she just needs to go home and take her blood pressure meds.  Patient states that she does not want to be worked up for her blood pressure at this time she is asymptomatic including chest pain, shortness of breath, headache, vision changes or neck pain.  Patient understands the risk of last 9 performing labs or imaging on patient's blood pressure and may lead to missed diagnoses and with full decision made capacity states that she understands these risks and accepts them.  In terms of patient's left knee pain patient did have tenderness to her left patellar tendon however it was intact and she was neurovascular intact with good motor skills.  There was some swelling to her left knee however patient is not been using a knee brace so we will give knee brace here.  X-ray is negative for any fractures and with patient's knee overall being reassuring and endorsing any infectious symptoms do not feel patient needs arthrocentesis at this time and can follow-up in the outpatient setting.  Will give patient orthopedic follow-up if her knee pain does not resolve.  Encouraged patient use Tylenol every 6 hours needed for pain, ice, elevate, rest.  Patient was given return precautions. Patient stable for discharge at this time.  Patient verbalized understanding of plan.  This chart was dictated using voice recognition software.  Despite best efforts to proofread,  errors can occur which can change the documentation meaning.         Final Clinical Impression(s) / ED Diagnoses Final diagnoses:  Acute pain of left knee  Uncontrolled hypertension    Rx / DC Orders ED Discharge Orders     None         Remi Deter 08/12/23 2214    Benjiman Core, MD 08/12/23 2325

## 2023-11-18 ENCOUNTER — Ambulatory Visit: Payer: Self-pay | Admitting: *Deleted

## 2023-11-18 ENCOUNTER — Telehealth

## 2023-11-18 ENCOUNTER — Encounter: Payer: Self-pay | Admitting: Student

## 2023-11-18 ENCOUNTER — Other Ambulatory Visit: Payer: Self-pay | Admitting: Student

## 2023-11-18 DIAGNOSIS — I10 Essential (primary) hypertension: Secondary | ICD-10-CM

## 2023-11-18 MED ORDER — PROPRANOLOL HCL 20 MG PO TABS: 40.0000 mg | ORAL_TABLET | Freq: Two times a day (BID) | ORAL | 1 refills | Status: DC

## 2023-11-18 NOTE — Telephone Encounter (Signed)
 Duplicate

## 2023-11-18 NOTE — Telephone Encounter (Signed)
 Copied from CRM [id number]. Topic: Clinical - Red Word Triage >> Nov 18, 2023 11:26 AM Dennison Nancy wrote: Red Word that prompted transfer to Nurse Triage: Patient has worsen symptoms swelling in her left knee  ( Was in process of scheduling patient for a Checking for medication refills, checkup on A1C and blood pressure , been feeling tired , worsen left knee and arthritis Reason for Disposition  Knee pain is a chronic symptom (recurrent or ongoing AND present > 4 weeks)  Answer Assessment - Initial Assessment Questions 1. LOCATION and RADIATION: "Where is the pain located?"      Left knee pain that has been going on since Nov. 2024.   I went to ED at [facility] and I had fluid on my knee.   I'm having swelling from knee and down to my ankle.    I'm out of my propranolol and it has expired.   I need a check up appt. 2. QUALITY: "What does the pain feel like?"  (e.g., sharp, dull, aching, burning)     I can walk but slower.   I can't fully straight it out. 3. SEVERITY: "How bad is the pain?" "What does it keep you from doing?"   (Scale 1-10; or mild, moderate, severe)   -  MILD (1-3): doesn't interfere with normal activities    -  MODERATE (4-7): interferes with normal activities (e.g., work or school) or awakens from sleep, limping    -  SEVERE (8-10): excruciating pain, unable to do any normal activities, unable to walk     Mild 4. ONSET: "When did the pain start?" "Does it come and go, or is it there all the time?"     Arthritis and fluid on my knee.   A long time. 5. RECURRENT: "Have you had this pain before?" If Yes, ask: "When, and what happened then?"     Yes 6. SETTING: "Has there been any recent work, exercise or other activity that involved that part of the body?"      No 7. AGGRAVATING FACTORS: "What makes the knee pain worse?" (e.g., walking, climbing stairs, running)     Walking a lot 8. ASSOCIATED SYMPTOMS: "Is there any swelling or redness of the knee?"     Swelling down to my ankle  and pain 9. OTHER SYMPTOMS: "Do you have any other symptoms?" (e.g., chest pain, difficulty breathing, fever, calf pain)     Just need my check up for medication refills too 10. PREGNANCY: "Is there any chance you are pregnant?" "When was your last menstrual period?"       Not asked  Protocols used: Knee Pain-A-AH  Chief Complaint: Left knee pain and fluid on knee from arthritis.   Also needing a check up for medication refills.   She is out of her propranolol that she uses for SVT.   LOV was back in 2023.   I let her know I would send the refill request  but she would probably need to be seen first since it's been a while since she has been in.   She was agreeable to this. Symptoms: Left knee pain and swelling due to arthritis per pt. Frequency: It's been an on and off problem for a long time.    Pertinent Negatives: Patient denies injuries Disposition: [] ED /[] Urgent Care (no appt availability in office) / [x] Appointment(In office/virtual)/ []  [facility]/ [] Home Care/ [] Refused Recommended Disposition /[] [facility]/ []  Follow-up with PCP Additional Notes: Appt made

## 2023-11-18 NOTE — Telephone Encounter (Signed)
 Copied from CRM [id number]. Topic: Clinical - Medication Refill >> Nov 18, 2023 11:18 AM Dennison Nancy wrote: Most Recent Primary Care Visit:  Provider: Champ Mungo  Department: [facility]  Visit Type: OPEN ESTABLISHED  Date: 05/30/2022  Medication: propranolol (INDERAL) 20 MG tablet  Has the patient contacted their pharmacy? Yes , Need new prescription (Agent: If no, request that the patient contact the pharmacy for the refill. If patient does not wish to contact the pharmacy document the reason why and proceed with request.) (Agent: If yes, when and what did the pharmacy advise?)  Is this the correct pharmacy for this prescription? Yes If no, delete pharmacy and type the correct one.  This is the patient's preferred pharmacy:  [id number] [place], [place] - [address] [place] [address] Phone: [phone] Fax: [phone]    Has the prescription been filled recently? No  Is the patient out of the medication? Yes  Has the patient been seen for an appointment in the last year OR does the patient have an upcoming appointment? Yes  Can we respond through MyChart? Yes  Agent: Please be advised that Rx refills may take up to 3 business days. We ask that you follow-up with your pharmacy.

## 2023-12-03 ENCOUNTER — Ambulatory Visit: Payer: Self-pay | Admitting: Student

## 2023-12-03 ENCOUNTER — Other Ambulatory Visit: Payer: Self-pay

## 2023-12-03 ENCOUNTER — Emergency Department (HOSPITAL_COMMUNITY)
Admission: EM | Admit: 2023-12-03 | Discharge: 2023-12-04 | Discharge status: Against Medical Advice | Attending: Emergency Medicine | Admitting: Emergency Medicine

## 2023-12-03 ENCOUNTER — Emergency Department (HOSPITAL_COMMUNITY)

## 2023-12-03 ENCOUNTER — Encounter (HOSPITAL_COMMUNITY): Payer: Self-pay

## 2023-12-03 VITALS — BP 198/120 | HR 79 | Temp 98.6°F | Ht 66.5 in | Wt 369.2 lb

## 2023-12-03 DIAGNOSIS — I10 Essential (primary) hypertension: Secondary | ICD-10-CM

## 2023-12-03 DIAGNOSIS — M7989 Other specified soft tissue disorders: Secondary | ICD-10-CM | POA: Diagnosis not present

## 2023-12-03 DIAGNOSIS — E119 Type 2 diabetes mellitus without complications: Secondary | ICD-10-CM

## 2023-12-03 DIAGNOSIS — R079 Chest pain, unspecified: Secondary | ICD-10-CM | POA: Diagnosis not present

## 2023-12-03 DIAGNOSIS — R200 Anesthesia of skin: Secondary | ICD-10-CM | POA: Insufficient documentation

## 2023-12-03 DIAGNOSIS — I1A0 Resistant hypertension: Secondary | ICD-10-CM

## 2023-12-03 DIAGNOSIS — I701 Atherosclerosis of renal artery: Secondary | ICD-10-CM | POA: Diagnosis not present

## 2023-12-03 DIAGNOSIS — R0789 Other chest pain: Secondary | ICD-10-CM | POA: Diagnosis not present

## 2023-12-03 DIAGNOSIS — Z794 Long term (current) use of insulin: Secondary | ICD-10-CM | POA: Diagnosis not present

## 2023-12-03 DIAGNOSIS — M25562 Pain in left knee: Secondary | ICD-10-CM

## 2023-12-03 DIAGNOSIS — Z7984 Long term (current) use of oral hypoglycemic drugs: Secondary | ICD-10-CM

## 2023-12-03 DIAGNOSIS — Z5321 Procedure and treatment not carried out due to patient leaving prior to being seen by health care provider: Secondary | ICD-10-CM | POA: Diagnosis not present

## 2023-12-03 DIAGNOSIS — Z79899 Other long term (current) drug therapy: Secondary | ICD-10-CM | POA: Diagnosis not present

## 2023-12-03 DIAGNOSIS — M545 Low back pain, unspecified: Secondary | ICD-10-CM | POA: Diagnosis not present

## 2023-12-03 DIAGNOSIS — E1169 Type 2 diabetes mellitus with other specified complication: Secondary | ICD-10-CM

## 2023-12-03 LAB — BASIC METABOLIC PANEL WITH GFR
Anion gap: 10 (ref 5–15)
BUN: 13 mg/dL (ref 6–20)
CO2: 27 mmol/L (ref 22–32)
Calcium: 9.1 mg/dL (ref 8.9–10.3)
Chloride: 98 mmol/L (ref 98–111)
Creatinine, Ser: 0.94 mg/dL (ref 0.44–1.00)
GFR, Estimated: >60 mL/min (ref >60)
Glucose, Bld: 244 mg/dL — ABNORMAL HIGH (ref 70–99)
Potassium: 4.4 mmol/L (ref 3.5–5.1)
Sodium: 135 mmol/L (ref 135–145)

## 2023-12-03 LAB — CBC
HCT: 32 % — ABNORMAL LOW (ref 36.0–46.0)
Hemoglobin: 9.6 g/dL — ABNORMAL LOW (ref 12.0–15.0)
MCH: 23 pg — ABNORMAL LOW (ref 26.0–34.0)
MCHC: 30 g/dL (ref 30.0–36.0)
MCV: 76.6 fL — ABNORMAL LOW (ref 80.0–100.0)
Platelets: 242 10*3/uL (ref 150–400)
RBC: 4.18 MIL/uL (ref 3.87–5.11)
RDW: 18.3 % — ABNORMAL HIGH (ref 11.5–15.5)
WBC: 9.1 10*3/uL (ref 4.0–10.5)
nRBC: 0 % (ref 0.0–0.2)

## 2023-12-03 LAB — HCG, SERUM, QUALITATIVE: Preg, Serum: NEGATIVE

## 2023-12-03 LAB — POCT GLYCOSYLATED HEMOGLOBIN (HGB A1C): Hemoglobin A1C: 8.5 % — AB (ref 4.0–5.6)

## 2023-12-03 LAB — TROPONIN I (HIGH SENSITIVITY): Troponin I (High Sensitivity): 8 ng/L (ref <18)

## 2023-12-03 LAB — GLUCOSE, CAPILLARY: Glucose-Capillary: 292 mg/dL — ABNORMAL HIGH (ref 70–99)

## 2023-12-03 NOTE — Assessment & Plan Note (Addendum)
 Lab Results  Component Value Date   HGBA1C 8.5 (A) 12/03/2023   Patient reports she was previously taking Ozempic 0.25 mg weekly however she stopped taking this medication because of severe abdominal cramping.  Patient reports that she would like to try metformin for her diabetes. - Will restart metformin and slowly titrate it up

## 2023-12-03 NOTE — ED Triage Notes (Signed)
 Pt c/o int midsternal chest pain that radiates to left chest and left and right arm numbnessx2 mos. Pt states it has been happening more frequently the past two days. Pt states her bp was 222/117 and the second 198/120 at her PCP appt. Pt has not been on bp meds and was there to get started on bp meds. PCP sent pt here for the chest pain and arm pain.

## 2023-12-03 NOTE — ED Notes (Signed)
Pt leaving d/t wait time.

## 2023-12-03 NOTE — ED Provider Triage Note (Signed)
 Emergency Medicine Provider Triage Evaluation Note  Maria Carpenter , a 43 y.o. female  was evaluated in triage.  Pt complains of chest pain.  Review of Systems  Positive:  Negative:   Physical Exam  BP (!) 185/127 (BP Location: Right Arm)   Pulse 92   Temp 98.5 F (36.9 C)   Resp 18   Ht 5' 6.5" (1.689 m)   Wt (!) 167.5 kg   SpO2 100%   BMI 58.71 kg/m  Gen:   Awake, no distress   Resp:  Normal effort  MSK:   Moves extremities without difficulty  Other:    Medical Decision Making  Medically screening exam initiated at 6:51 PM.  Appropriate orders placed.  Maria Carpenter was informed that the remainder of the evaluation will be completed by another provider, this initial triage assessment does not replace that evaluation, and the importance of remaining in the ED until their evaluation is complete.  Central chest pain radiating to left arm and sometimes right arm x2 months. Chest pain not associated with rest vs exertion. Happened last night while laying in bed. Last episode of chest pain around 1 hours ago. Chest pain episodes are very short. Chest pain worse x2 days. Also with intermittent numbness in arms. Also with HTN. Also with left leg swelling. PCP referring patient to ED for the chest pain.   Dorthy Cooler, [place] 12/03/23 [phone]

## 2023-12-03 NOTE — Patient Instructions (Addendum)
 Thank you, [name] for allowing [place] to provide your care today. Today we discussed:  You are reporting chest pain with left arm pain that comes and goes, with increasing in frequency.   I need you to go to ED to get evaluated.   If everything is well, I will call you tomorrow about your medications.   I will slowly start you on blood pressure medications.   I have ordered the following labs for you:  Lab Orders         Microalbumin / Creatinine Urine Ratio         Glucose, capillary         POC Hbg A1C      Tests ordered today:  As above   Referrals ordered today:   Referral Orders         Ambulatory referral to Physical Therapy         Ambulatory referral to Ophthalmology      I have ordered the following medication/changed the following medications:   Stop the following medications: Medications Discontinued During This Encounter  Medication Reason   acetaminophen (TYLENOL) 325 MG tablet    B-D UF III MINI PEN NEEDLES 31G X 5 MM MISC    ibuprofen (ADVIL,MOTRIN) 200 MG tablet      Start the following medications: No orders of the defined types were placed in this encounter.    Follow up:  1 month   Chronic conditions    Remember:   Should you have any questions or concerns please call the internal medicine clinic at [phone].     Maria Pinch, DO [facility]

## 2023-12-03 NOTE — Assessment & Plan Note (Signed)
 BP Readings from Last 3 Encounters:  12/03/23 (!) 185/127  12/03/23 (!) 198/120  08/12/23 (!) 229/122   Patient presents to clinic after last seen on 06/01/2022, reports that she has not been taking any of her blood pressure medications since last dispensed in March 2024.  Patient reports that she has had a lot of life events, that led her to stop taking care of herself.  Home medication consist of amlodipine 10 mg daily, chlorthalidone 25 mg daily, lisinopril 40 mg daily.  Patient does have a history of left renal artery stenosis noted on 04/2020 and OSA not on CPAP.  Blood pressures are severely elevated during this visit, initial one 227/111.  Patient reports that she has been having this left-sided sharp chest pain, that comes and goes, has been increasing in frequency, last episode last night with pain along her left arm.  States that the pain can come on during rest or during activity.  Denies any current chest pain and shortness of breath.  I am concerned about her symptoms of chest pains with elevated blood pressures, I encouraged the patient to be evaluated at the ED for ACS workup.  -Patient is encouraged to go to ED to get evaluated for ACS workup -Will follow-up with patient tomorrow for further blood pressure management -I would like to start her back on her home medications, one at a time as BP tolerates.  -Recommend patient follow-up in 1 month

## 2023-12-03 NOTE — Progress Notes (Signed)
 Established Patient Office Visit  Subjective   Patient ID: Maria Carpenter, female    DOB: 06-Oct-1980  Age: 43 y.o. MRN: [id number]  Chief Complaint  Patient presents with   Diabetes   Medication Refill    Wants to get a dexcom receiver/sensor   / check top of left foot   HPI  This is a 43 year old female living with a history stated below and presents today for follow up on chronic conditions. Please see problem based assessment and plan for additional details.    Past Medical History:  Diagnosis Date   Anemia    Asthma    Chlamydia 2012   treated   Complication of anesthesia    " they have trouble waking me up"   Diabetes mellitus    A2DM during pregnancy. now on metformin.    Dyspnea 05/07/2019   Dysuria 04/11/2019   Family history of anesthesia complication    mother & daughter    GERD (gastroesophageal reflux disease)    Gonorrhea 2010   treated   Hypertension    Lupus 10-2013   Morbid obesity (HCC) 07/22/2011   Pregnancy induced hypertension    pre E with pregnancies   Rheumatoid arthritis(714.0)    Shortness of breath    Sleep apnea    does not use cpap   Stroke [facility]) 2012   March 2012>right side deficit (speech, upper/ower weakness)   SVT (supraventricular tachycardia) (HCC)     ROS As per assessment and plan  Objective:     BP (!) 198/120 (BP Location: Left Arm, Patient Position: Sitting, Cuff Size: Large)   Pulse 79   Temp 98.6 F (37 C) (Oral)   Ht 5' 6.5" (1.689 m)   Wt (!) 369 lb 3.2 oz (167.5 kg)   SpO2 99%   BMI 58.70 kg/m  BP Readings from Last 3 Encounters:  12/03/23 (!) 185/127  12/03/23 (!) 198/120  08/12/23 (!) 229/122   Wt Readings from Last 3 Encounters:  12/03/23 (!) 369 lb 4.3 oz (167.5 kg)  12/03/23 (!) 369 lb 3.2 oz (167.5 kg)  05/30/22 (!) 354 lb 8 oz (160.8 kg)   SpO2 Readings from Last 3 Encounters:  12/03/23 100%  12/03/23 99%  08/12/23 100%      Physical Exam  General: Sitting in chair, no acute  distress Cardiovascular: Regular rate, no murmurs appreciated Pulmonary: Breathing comfortably, no wheezing or crackles Abdomen: Soft, nontender, nondistended, bowel sounds present MSK: No lower extremity pitting edema  Results for orders placed or performed during the hospital encounter of 12/03/23  CBC  Result Value Ref Range   WBC 9.1 4.0 - 10.5 K/uL   RBC 4.18 3.87 - 5.11 MIL/uL   Hemoglobin 9.6 (L) 12.0 - 15.0 g/dL   HCT 78.2 (L) 95.6 - 21.3 %   MCV 76.6 (L) 80.0 - 100.0 fL   MCH 23.0 (L) 26.0 - 34.0 pg   MCHC 30.0 30.0 - 36.0 g/dL   RDW 08.6 (H) 57.8 - 46.9 %   Platelets 242 150 - 400 K/uL   nRBC 0.0 0.0 - 0.2 %  Results for orders placed or performed in visit on 12/03/23  Glucose, capillary  Result Value Ref Range   Glucose-Capillary 292 (H) 70 - 99 mg/dL  POC Hbg G2X  Result Value Ref Range   Hemoglobin A1C 8.5 (A) 4.0 - 5.6 %   HbA1c POC (<> result, manual entry)     HbA1c, POC (prediabetic range)  HbA1c, POC (controlled diabetic range)      Last CBC Lab Results  Component Value Date   WBC 9.1 12/03/2023   HGB 9.6 (L) 12/03/2023   HCT 32.0 (L) 12/03/2023   MCV 76.6 (L) 12/03/2023   MCH 23.0 (L) 12/03/2023   RDW 18.3 (H) 12/03/2023   PLT 242 12/03/2023   Last metabolic panel Lab Results  Component Value Date   GLUCOSE 141 (H) 05/30/2022   NA 138 05/30/2022   K 3.8 05/30/2022   CL 100 05/30/2022   CO2 24 05/30/2022   BUN 12 05/30/2022   CREATININE 0.83 05/30/2022   EGFR 91 05/30/2022   CALCIUM 9.7 05/30/2022   PROT 7.1 05/30/2022   ALBUMIN 4.0 05/30/2022   LABGLOB 3.1 05/30/2022   AGRATIO 1.3 05/30/2022   BILITOT 0.3 05/30/2022   ALKPHOS 89 05/30/2022   AST 15 05/30/2022   ALT 11 05/30/2022   ANIONGAP 7 06/29/2020   Last lipids Lab Results  Component Value Date   CHOL 155 06/29/2020   HDL 41 06/29/2020   LDLCALC 100 (H) 06/29/2020   TRIG 71 06/29/2020   CHOLHDL 3.8 06/29/2020   Last hemoglobin A1c Lab Results  Component Value Date    HGBA1C 8.5 (A) 12/03/2023   Last thyroid functions Lab Results  Component Value Date   TSH 0.780 04/09/2019      The ASCVD Risk score (Arnett DK, et al., 2019) failed to calculate for the following reasons:   Risk score cannot be calculated because patient has a medical history suggesting prior/existing ASCVD    Assessment & Plan:  Patient is discussed with Dr Frances Furbish   Problem List Items Addressed This Visit       Cardiovascular and Mediastinum   Resistant hypertension - Primary   BP Readings from Last 3 Encounters:  12/03/23 (!) 185/127  12/03/23 (!) 198/120  08/12/23 (!) 229/122   Patient presents to clinic after last seen on 06/01/2022, reports that she has not been taking any of her blood pressure medications since last dispensed in March 2024.  Patient reports that she has had a lot of life events, that led her to stop taking care of herself.  Home medication consist of amlodipine 10 mg daily, chlorthalidone 25 mg daily, lisinopril 40 mg daily.  Patient does have a history of left renal artery stenosis noted on 04/2020 and OSA not on CPAP.  Blood pressures are severely elevated during this visit, initial one 227/111.  Patient reports that she has been having this left-sided sharp chest pain, that comes and goes, has been increasing in frequency, last episode last night with pain along her left arm.  States that the pain can come on during rest or during activity.  Denies any current chest pain and shortness of breath.  I am concerned about her symptoms of chest pains with elevated blood pressures, I encouraged the patient to be evaluated at the ED for ACS workup.  -Patient is encouraged to go to ED to get evaluated for ACS workup -Will follow-up with patient tomorrow for further blood pressure management -I would like to start her back on her home medications, one at a time as BP tolerates.  -Recommend patient follow-up in 1 month      Left renal artery stenosis (HCC)      Endocrine   Diabetes (HCC)   Lab Results  Component Value Date   HGBA1C 8.5 (A) 12/03/2023   Patient reports she was previously taking Ozempic 0.25 mg weekly however  she stopped taking this medication because of severe abdominal cramping.  Patient reports that she would like to try metformin for her diabetes. - Will restart metformin and slowly titrate it up      Relevant Orders   POC Hbg A1C (Completed)   Ambulatory referral to Ophthalmology   Microalbumin / Creatinine Urine Ratio   Other Visit Diagnoses       Acute pain of left knee         Essential hypertension          ADDENDUM 12/04/2023 - Patient went to ED later in the evening of 04/08 and left due to wait times.  - Labs in the ED showed normal Cr, normal electrolytes, GFR > 60, Anemia, macrocytic, trop 8. CBG 240s - CXR wo mediastinal widening or cardiomegaly, normal - EKG with normal rate and rhythm, no ST elevation or depressions.  - With this data, low suspicion for ACS at this time.   Uncontrolled HTN - Will restart home medications, amlodipine 10 mg daily, chlorthalidone 25 mg daily, lisinopril 40 mg daily - Patient is advised to start these medications, one at a time with 2-3 days in between and monitor blood pressures daily  - Advised to follow up in one month  HLD - Previous hx of stroke 2012  - Restart Crestor 20 mg daily  - Will need to check lipid panel at next office visit   Diabetes: - Restart Metformin with 500 mg BID for first two weeks, then increase it to 500 mg weekly, max at 2000 mg per day  - Follow up on urine ACR next office visit    Return in about 1 month (around 01/02/2024) for HTN .    Jeral Pinch, DO

## 2023-12-04 ENCOUNTER — Emergency Department (HOSPITAL_COMMUNITY)

## 2023-12-04 ENCOUNTER — Other Ambulatory Visit (HOSPITAL_COMMUNITY): Payer: Self-pay | Admitting: *Deleted

## 2023-12-04 ENCOUNTER — Other Ambulatory Visit: Payer: Self-pay

## 2023-12-04 DIAGNOSIS — Z79899 Other long term (current) drug therapy: Secondary | ICD-10-CM | POA: Insufficient documentation

## 2023-12-04 DIAGNOSIS — I10 Essential (primary) hypertension: Secondary | ICD-10-CM | POA: Insufficient documentation

## 2023-12-04 DIAGNOSIS — Z794 Long term (current) use of insulin: Secondary | ICD-10-CM | POA: Insufficient documentation

## 2023-12-04 DIAGNOSIS — Z7984 Long term (current) use of oral hypoglycemic drugs: Secondary | ICD-10-CM | POA: Insufficient documentation

## 2023-12-04 DIAGNOSIS — R0789 Other chest pain: Secondary | ICD-10-CM | POA: Diagnosis not present

## 2023-12-04 DIAGNOSIS — M7989 Other specified soft tissue disorders: Secondary | ICD-10-CM | POA: Insufficient documentation

## 2023-12-04 DIAGNOSIS — M545 Low back pain, unspecified: Secondary | ICD-10-CM | POA: Insufficient documentation

## 2023-12-04 MED ORDER — ROSUVASTATIN CALCIUM 20 MG PO TABS: 20.0000 mg | ORAL_TABLET | Freq: Every day | ORAL | 3 refills | Status: AC

## 2023-12-04 MED ORDER — AMLODIPINE BESYLATE 10 MG PO TABS: 10.0000 mg | ORAL_TABLET | Freq: Every day | ORAL | 1 refills | Status: DC

## 2023-12-04 MED ORDER — ALBUTEROL SULFATE HFA 108 (90 BASE) MCG/ACT IN AERS: 2.0000 | INHALATION_SPRAY | Freq: Four times a day (QID) | RESPIRATORY_TRACT | 0 refills | Status: DC | PRN

## 2023-12-04 MED ORDER — ACCU-CHEK SOFTCLIX LANCETS MISC: 11 refills | Status: AC

## 2023-12-04 MED ORDER — DEXCOM G6 TRANSMITTER MISC: 3 refills | Status: AC

## 2023-12-04 MED ORDER — METFORMIN HCL 500 MG PO TABS: 1000.0000 mg | ORAL_TABLET | Freq: Two times a day (BID) | ORAL | 3 refills | Status: AC

## 2023-12-04 MED ORDER — DEXCOM G6 RECEIVER DEVI
0 refills | Status: AC
Start: 2023-12-04 — End: ?

## 2023-12-04 MED ORDER — NYSTATIN 100000 UNIT/GM EX POWD
1.0000 | Freq: Three times a day (TID) | CUTANEOUS | 0 refills | Status: DC
Start: 2023-12-04 — End: 2024-01-07

## 2023-12-04 MED ORDER — DEXCOM G6 SENSOR MISC: 11 refills | Status: AC

## 2023-12-04 MED ORDER — CHLORTHALIDONE 25 MG PO TABS: 25.0000 mg | ORAL_TABLET | Freq: Every day | ORAL | 3 refills | Status: DC

## 2023-12-04 MED ORDER — PANTOPRAZOLE SODIUM 20 MG PO TBEC: 20.0000 mg | DELAYED_RELEASE_TABLET | Freq: Every day | ORAL | 1 refills | Status: DC

## 2023-12-04 MED ORDER — LISINOPRIL 40 MG PO TABS
40.0000 mg | ORAL_TABLET | Freq: Every day | ORAL | 3 refills | Status: DC
Start: 2023-12-04 — End: 2024-04-16

## 2023-12-04 MED ORDER — ACCU-CHEK GUIDE W/DEVICE KIT: 1.0000 | PACK | Freq: Four times a day (QID) | 1 refills | Status: AC

## 2023-12-04 NOTE — Addendum Note (Signed)
 Addended by: Jeral Pinch on: 12/04/2023 11:17 AM   Modules accepted: Orders

## 2023-12-04 NOTE — ED Triage Notes (Addendum)
 Left leg pain and swelling. Lower back pain. Headache. HTN- 241/134 multiple times. Vision blurry. Decreased urine output. Yesterday had work up for CP and leg swelling. Has been out of BP meds for a year- refilled yesterday-has not taken today.

## 2023-12-04 NOTE — Progress Notes (Signed)
 Internal Medicine Clinic Attending  Case discussed with the resident at the time of the visit.  We reviewed the resident's history and exam and pertinent patient test results.  I agree with the assessment, diagnosis, and plan of care documented in the resident's note. Severe HTN likely for years not due to poor adherence and follow up, now with intermittent chest pain radiating to left arm last night and today.  For safety she was advised to be evaluated in the ED, we will have a safe antihypertensive restart plan in the event that her cardiac workup is negative for ischemia.  Given poor medication adherence, relatively young age we will need to discuss possibility of surgical management of renal artery stenosis at close follow up visit if patient is amenable.  Also need to discuss better CPAP compliance.

## 2023-12-05 ENCOUNTER — Emergency Department (HOSPITAL_BASED_OUTPATIENT_CLINIC_OR_DEPARTMENT_OTHER)
Admission: EM | Admit: 2023-12-05 | Discharge: 2023-12-05 | Disposition: A | Discharge status: Home / Self Care | Source: Home / Self Care | Attending: Emergency Medicine | Admitting: Emergency Medicine

## 2023-12-05 ENCOUNTER — Ambulatory Visit: Payer: Self-pay

## 2023-12-05 ENCOUNTER — Encounter (HOSPITAL_BASED_OUTPATIENT_CLINIC_OR_DEPARTMENT_OTHER): Payer: Self-pay

## 2023-12-05 DIAGNOSIS — M545 Low back pain, unspecified: Secondary | ICD-10-CM

## 2023-12-05 LAB — TROPONIN I (HIGH SENSITIVITY)
Troponin I (High Sensitivity): 17 ng/L (ref <18)
Troponin I (High Sensitivity): 18 ng/L — ABNORMAL HIGH (ref <18)

## 2023-12-05 LAB — URINALYSIS, ROUTINE W REFLEX MICROSCOPIC
Bacteria, UA: NONE SEEN
Bilirubin Urine: NEGATIVE
Glucose, UA: NEGATIVE mg/dL
Hgb urine dipstick: NEGATIVE
Ketones, ur: NEGATIVE mg/dL
Leukocytes,Ua: NEGATIVE
Nitrite: NEGATIVE
Protein, ur: 30 mg/dL — AB
Specific Gravity, Urine: 1.024 (ref 1.005–1.030)
pH: 6 (ref 5.0–8.0)

## 2023-12-05 LAB — CBC
HCT: 32.1 % — ABNORMAL LOW (ref 36.0–46.0)
Hemoglobin: 9.9 g/dL — ABNORMAL LOW (ref 12.0–15.0)
MCH: 23.3 pg — ABNORMAL LOW (ref 26.0–34.0)
MCHC: 30.8 g/dL (ref 30.0–36.0)
MCV: 75.7 fL — ABNORMAL LOW (ref 80.0–100.0)
Platelets: 285 10*3/uL (ref 150–400)
RBC: 4.24 MIL/uL (ref 3.87–5.11)
RDW: 18.5 % — ABNORMAL HIGH (ref 11.5–15.5)
WBC: 9.7 10*3/uL (ref 4.0–10.5)
nRBC: 0 % (ref 0.0–0.2)

## 2023-12-05 LAB — BASIC METABOLIC PANEL WITH GFR
Anion gap: 10 (ref 5–15)
BUN: 13 mg/dL (ref 6–20)
CO2: 23 mmol/L (ref 22–32)
Calcium: 9.3 mg/dL (ref 8.9–10.3)
Chloride: 99 mmol/L (ref 98–111)
Creatinine, Ser: 0.81 mg/dL (ref 0.44–1.00)
GFR, Estimated: >60 mL/min (ref >60)
Glucose, Bld: 178 mg/dL — ABNORMAL HIGH (ref 70–99)
Potassium: 4.7 mmol/L (ref 3.5–5.1)
Sodium: 132 mmol/L — ABNORMAL LOW (ref 135–145)

## 2023-12-05 MED ORDER — OXYCODONE HCL 5 MG PO TABS
5.0000 mg | ORAL_TABLET | Freq: Once | ORAL | Status: AC
  Administered 2023-12-05: 5 mg via ORAL
  Filled 2023-12-05: qty 1

## 2023-12-05 MED ORDER — DIAZEPAM 5 MG PO TABS
5.0000 mg | ORAL_TABLET | Freq: Once | ORAL | Status: AC
  Administered 2023-12-05: 5 mg via ORAL
  Filled 2023-12-05: qty 1

## 2023-12-05 MED ORDER — KETOROLAC TROMETHAMINE 15 MG/ML IJ SOLN
15.0000 mg | Freq: Once | INTRAMUSCULAR | Status: AC
  Administered 2023-12-05: 15 mg via INTRAVENOUS
  Filled 2023-12-05: qty 1

## 2023-12-05 MED ORDER — ACETAMINOPHEN 500 MG PO TABS
1000.0000 mg | ORAL_TABLET | Freq: Once | ORAL | Status: DC
  Filled 2023-12-05: qty 2

## 2023-12-05 MED ORDER — DICLOFENAC SODIUM 1 % EX GEL: 4.0000 g | Freq: Four times a day (QID) | CUTANEOUS | 0 refills | Status: DC

## 2023-12-05 MED ORDER — ALUM & MAG HYDROXIDE-SIMETH 200-200-20 MG/5ML PO SUSP
30.0000 mL | Freq: Once | ORAL | Status: AC
  Administered 2023-12-05: 30 mL via ORAL
  Filled 2023-12-05: qty 30

## 2023-12-05 NOTE — Discharge Instructions (Signed)
 Your urine looks good.  No signs of infection.  I have put in an order for you to get an ultrasound in the morning.  Please call your family doctor this morning and let them know about your visits in the emergency department.

## 2023-12-05 NOTE — Telephone Encounter (Signed)
  Chief Complaint: stronger medication for back pain Symptoms: pain Frequency: ongoing - gotten worse Pertinent Negatives: Patient denies  Disposition: [] ED /[] Urgent Care (no appt availability in office) / [] Appointment(In office/virtual)/ []  [facility]/ [] Home Care/ [] Refused Recommended Disposition /[] [facility]/ [x]  Follow-up with PCP Additional Notes: Pt was seen at ED today for back and chest pain. Pt states that back pain is bad and Tylenol and Voltaren cream are not effective in controlling her pain. Pt would like a stronger medication called into the pharmacy.  Pt also has questions regarding recent UA which showed protein in her urine.   Pt has not received call today regarding [place] for her kidney area.  Office has closed for the evening - no appts available until 06/10/2024.  Please advise.    Copied from CRM [id number]. Topic: Clinical - Prescription Issue >> Dec 05, 2023  4:17 PM Antony Haste wrote: Reason for CRM: The patient is requesting a new prescription cream to be called in for her recent diagnosis Acute left-sided low back pain without sciatica, during her ER visit last night she was prescribed Voltaren cream to manage her pain. She tried applying her cream today and states it's not as effective as expected. She would like something stronger to help subside her pain. I scheduled the patient her hospital follow-up and advised someone will follow up with her. PT aware. Callback #:[phone] Reason for Disposition  Prescription request for new medicine (not a refill)  Answer Assessment - Initial Assessment Questions 1. DRUG NAME: "What medicine do you need to have refilled?" 5. SYMPTOMS: "Do you have any symptoms?"     Back pain  Protocols used: Medication Refill and Renewal Call-A-AH

## 2023-12-05 NOTE — ED Notes (Signed)
Provider made aware of patient b/p

## 2023-12-05 NOTE — ED Provider Notes (Signed)
 [place] EMERGENCY DEPARTMENT AT [facility] Provider Note   CSN: [id number] Arrival date & time: 12/04/23  2320     History  Chief Complaint  Patient presents with   Back Pain   Leg Swelling   Hypertension    Maria Carpenter is a 43 y.o. female.  43 yo F with a chief complaints of left-sided low back pain.  She said that this been bothering her for little while but she forgot to bring it up to her family doctor when she saw them yesterday.  She has had low back pain before but thinks that this feels different.  This time its pinpoint worse with certain movements twisting palpation.  Denies abdominal pain denies urinary symptoms.  She still having chest pain that seems to come and go.  She did mention this to her family doctor and was sent to the ED.  Unfortunately due to wait times she had left prior to being seen by physician.   Back Pain Hypertension       Home Medications Prior to Admission medications   Medication Sig Start Date End Date Taking? Authorizing Provider  diclofenac Sodium (VOLTAREN) 1 % GEL Apply 4 g topically 4 (four) times daily. 12/05/23  Yes Melene Plan, DO  Accu-Chek Softclix Lancets lancets Check blood sugar 3 times a day 12/04/23   Tawkaliyar, Roya, DO  albuterol (VENTOLIN HFA) 108 (90 Base) MCG/ACT inhaler Inhale 2 puffs into the lungs every 6 (six) hours as needed for wheezing or shortness of breath. 12/04/23   Tawkaliyar, Roya, DO  amLODipine (NORVASC) 10 MG tablet Take 1 tablet (10 mg total) by mouth daily. 12/04/23   Tawkaliyar, Roya, DO  Blood Glucose Monitoring Suppl (ACCU-CHEK GUIDE) w/Device KIT 1 each by Does not apply route 4 (four) times daily. Check blood sugar 3 times a day 12/04/23   Tawkaliyar, Roya, DO  chlorthalidone (HYGROTON) 25 MG tablet Take 1 tablet (25 mg total) by mouth daily. 12/04/23   Tawkaliyar, Laney Potash, DO  Cholecalciferol 25 MCG (1000 UT) capsule Take 1 capsule (1,000 Units total) by mouth daily. 06/01/22   Champ Mungo, DO   Continuous Glucose Receiver (DEXCOM G6 RECEIVER) DEVI Use to check blood sugar at least 6 times a day 12/04/23   Tawkaliyar, Roya, DO  Continuous Glucose Sensor (DEXCOM G6 SENSOR) MISC Check blood sugar at least 6 times a day 12/04/23   Tawkaliyar, Roya, DO  Continuous Glucose Transmitter (DEXCOM G6 TRANSMITTER) MISC Use to check blood sugar at least 6 times a day 12/04/23   Tawkaliyar, Roya, DO  gabapentin (NEURONTIN) 300 MG capsule Take 1 capsule (300 mg total) by mouth at bedtime. 05/30/22   Champ Mungo, DO  glucose blood (ACCU-CHEK GUIDE) test strip Check blood sugar 3 times daily. 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 07/16/19   Masoudi, Elhamalsadat, MD  lisinopril (ZESTRIL) 40 MG tablet Take 1 tablet (40 mg total) by mouth daily. 12/04/23   Tawkaliyar, Roya, DO  metFORMIN (GLUCOPHAGE) 500 MG tablet Take 2 tablets (1,000 mg total) by mouth 2 (two) times daily with a meal. 12/04/23   Tawkaliyar, Roya, DO  Multiple Vitamin (MULTIVITAMIN WITH MINERALS) TABS tablet Take 1 tablet by mouth daily.  [provider]  nystatin powder Apply 1 Application topically 3 (three) times daily. 12/04/23   Tawkaliyar, Roya, DO  pantoprazole (PROTONIX) 20 MG tablet Take 1 tablet (20 mg total) by mouth daily. 12/04/23 12/03/24  Tawkaliyar, Laney Potash, DO  Probiotic Product (PROBIOTIC PO) Take 1 capsule by mouth daily.    [provider]  propranolol (INDERAL) 20 MG tablet Take 2 tablets (40 mg total) by mouth 2 (two) times daily. 11/18/23   Modena Slater, DO  rosuvastatin (CRESTOR) 20 MG tablet Take 1 tablet (20 mg total) by mouth daily. 12/04/23 03/03/24  Tawkaliyar, Laney Potash, DO  Semaglutide,0.25 or 0.5MG /DOS, 2 MG/3ML SOPN Inject 0.25 mg into the skin once a week. 05/30/22   Champ Mungo, DO  Vitamin D, Ergocalciferol, (DRISDOL) 1.25 MG (50000 UNIT) CAPS capsule Take 1 capsule (50,000 Units total) by mouth every 7 (seven) days. 06/08/22   Champ Mungo, DO  budesonide (PULMICORT) 180 MCG/ACT inhaler Inhale 1 puff into the lungs 2 (two) times daily. 05/14/19 05/18/19  Lorenso Courier, MD      Allergies    Amoxicillin, Cashew nut (anacardium occidentale) skin test, Dilaudid [hydromorphone hcl], Egg-derived products, and Pineapple    Review of Systems   Review of Systems  Musculoskeletal:  Positive for back pain.    Physical Exam Updated Vital Signs BP (!) 145/75   Pulse 78   Temp 98.4 F (36.9 C)   Resp 20   SpO2 95%  Physical Exam Vitals and nursing note reviewed.  Constitutional:      General: She is not in acute distress.    Appearance: She is well-developed. She is not diaphoretic.     Comments: BMI 58  HENT:     Head: Normocephalic and atraumatic.  Eyes:     Pupils: Pupils are equal, round, and reactive to light.  Cardiovascular:     Rate and Rhythm: Normal rate and regular rhythm.     Heart sounds: No murmur heard.    No friction rub. No gallop.  Pulmonary:     Effort: Pulmonary effort is normal.     Breath sounds: No wheezing or rales.  Abdominal:     General: There is no distension.     Palpations: Abdomen is soft.     Tenderness: There is no abdominal tenderness.  Musculoskeletal:        General: Tenderness present.     Cervical back: Normal range of motion and neck supple.     Comments: Palpable discomfort to the left low paraspinal musculature along the L-spine.  No obvious midline L-spine tenderness develops or deformities.  Pulse motor and sensation intact in bilateral lower extremities.  Difficult to appreciate any difference in size from 1 leg to the other.  Skin:    General: Skin is warm and dry.  Neurological:     Mental Status: She is alert and oriented to person, place, and time.  Psychiatric:        Behavior: Behavior normal.      ED Results / Procedures / Treatments   Labs (all labs ordered are listed, but only abnormal results are displayed) Labs Reviewed  BASIC METABOLIC PANEL WITH GFR - Abnormal; Notable for the following components:      Result Value   Sodium 132 (*)    Glucose, Bld 178 (*)    All other components within normal limits  CBC - Abnormal; Notable for the following components:   Hemoglobin 9.9 (*)    HCT 32.1 (*)  MCV 75.7 (*)    MCH 23.3 (*)    RDW 18.5 (*)    All other components within normal limits  URINALYSIS, ROUTINE W REFLEX MICROSCOPIC - Abnormal; Notable for the following components:   Protein, ur 30 (*)    All other components within normal limits  TROPONIN I (HIGH SENSITIVITY) - Abnormal; Notable for the following components:   Troponin I (High Sensitivity) 18 (*)    All other components within normal limits  TROPONIN I (HIGH SENSITIVITY)    EKG EKG Interpretation Date/Time:  Wednesday December 04 2023 23:54:34 EDT Ventricular Rate:  91 PR Interval:  156 QRS Duration:  78 QT Interval:  338 QTC Calculation: 415 R Axis:   47  Text Interpretation: Normal sinus rhythm Abnormal ECG No significant change since last tracing Confirmed by Melene Plan [phone]) on 12/05/2023 12:32:02 AM  Radiology DG Chest 2 View Result Date: 12/03/2023 CLINICAL DATA:  Chest pain for 2 months EXAM: CHEST - 2 VIEW COMPARISON:  08/16/2020 FINDINGS: The heart size and mediastinal contours are within normal limits. Both lungs are clear. The visualized skeletal structures are unremarkable. IMPRESSION: No active cardiopulmonary disease. Electronically Signed   By: Jasmine Pang M.D.   On: 12/03/2023 20:39    Procedures Procedures    Medications Ordered in ED Medications  acetaminophen (TYLENOL) tablet 1,000 mg (1,000 mg Oral Not Given 12/05/23 0102)  ketorolac (TORADOL) 15 MG/ML injection 15 mg (15 mg Intravenous Given 12/05/23 0102)  oxyCODONE (Oxy IR/ROXICODONE) immediate release tablet 5 mg (5  mg Oral Given 12/05/23 0103)  diazepam (VALIUM) tablet 5 mg (5 mg Oral Given 12/05/23 0103)  alum & mag hydroxide-simeth (MAALOX/MYLANTA) 200-200-20 MG/5ML suspension 30 mL (30 mLs Oral Given 12/05/23 0102)    ED Course/ Medical Decision Making/ A&P                                 Medical Decision Making Amount and/or Complexity of Data Reviewed Labs: ordered.  Risk OTC drugs. Prescription drug management.   43 yo F with a chief complaints of left-sided low back pain.  Atraumatic.  Worse with certain positions movement palpation.  No obvious red flags.  Patient has a history of poorly controlled hypertension and has been off of her medications for about 3 months.  Saw her family doctor yesterday and was having some episodic chest discomfort and instructed to come to the ED.  She went to the ED yesterday had troponins that were negative and then left without being seen due to wait times.  She is continue to have the chest discomfort off and on.  She did talk to her family doctor on the phone who had sent prescriptions in for her however she has not yet filled these.  Hypertensive here.  Multiple complaints.  Back pain seems musculoskeletal by history and physical.  She has some concern about left leg swelling.  Reportedly there was some difficulty with palpation of pulses with her PCP.  For me her dorsalis pedis pulses 2+ and equal to the other side.  She was worried about the possibility of a DVT.  Unfortunately ultrasound not available at this time.  Will place an order for the study performed tomorrow.  Waiting repeat blood work she is also endorsing decreased urine output.  Troponin here is negative.  Anemia unchanged.  UA negative for infection.  Discussed results with patient.  Will go home at this time.  PCP follow-up.  3:24 AM:  I have discussed the diagnosis/risks/treatment options with the patient and family.  Evaluation and diagnostic testing in the emergency department  does not suggest an emergent condition requiring admission or immediate intervention beyond what has been performed at this time.  They will follow up with PCP. We also discussed returning to the ED immediately if new or worsening sx occur. We discussed the sx which are most concerning (e.g., sudden worsening pain, fever, inability to tolerate by mouth) that necessitate immediate return. Medications administered to the patient during their visit and any new prescriptions provided to the patient are listed below.  Medications given during this visit Medications  acetaminophen (TYLENOL) tablet 1,000 mg (1,000 mg Oral Not Given 12/05/23 0102)  ketorolac (TORADOL) 15 MG/ML injection 15 mg (15 mg Intravenous Given 12/05/23 0102)  oxyCODONE (Oxy IR/ROXICODONE) immediate release tablet 5 mg (5 mg Oral Given 12/05/23 0103)  diazepam (VALIUM) tablet 5 mg (5 mg Oral Given 12/05/23 0103)  alum & mag hydroxide-simeth (MAALOX/MYLANTA) 200-200-20 MG/5ML suspension 30 mL (30 mLs Oral Given 12/05/23 0102)     The patient appears reasonably screen and/or stabilized for discharge and I doubt any other medical condition or other [facility] requiring further screening, evaluation, or treatment in the ED at this time prior to discharge.          Final Clinical Impression(s) / ED Diagnoses Final diagnoses:  Acute left-sided low back pain without sciatica    Rx / DC Orders ED Discharge Orders          Ordered    US Venous Img Lower Unilateral Left        12/05/23 0252    diclofenac Sodium (VOLTAREN) 1 % GEL  4 times daily        12/05/23 0253              Melene Plan, DO 12/05/23 [phone]

## 2023-12-06 ENCOUNTER — Telehealth: Payer: Self-pay

## 2023-12-06 NOTE — Telephone Encounter (Signed)
 Will forward to PCP

## 2023-12-06 NOTE — Telephone Encounter (Signed)
 Prior Authorization for patient (Dexcom G6 Sensor) came through on cover my meds was submitted with last office notes and labs awaiting approval or denial.  KEY:[id number]

## 2023-12-06 NOTE — Telephone Encounter (Signed)
 Marciano Sequin (Key: [id number]) PA Case ID #: [id number] Rx #: [id number] Need Help? Call us at [phone] Outcome Approved today by [facility] 2017 NCPDP Request Reference Number: [id number]. DEXCOM G6 MIS SENSOR is approved through 06/06/2024. For further questions, call [employer] at [phone]. Effective Date: 12/06/2023 Authorization Expiration Date: 06/06/2024 Drug Dexcom G6 Sensor ePA cloud logo Form [employer] Electronic Prior Authorization Form [id number] NCPDP) Original Claim Info 75 PA Req, Dr submit ePA at [employer].comOr MD Call [phone], Possible 3 DSSUBMIT 03 Lvl of Service PA# 72 PA TYPE8Drug Requires Prior Authorizatio

## 2023-12-07 ENCOUNTER — Other Ambulatory Visit (HOSPITAL_BASED_OUTPATIENT_CLINIC_OR_DEPARTMENT_OTHER): Payer: Self-pay | Admitting: Emergency Medicine

## 2023-12-07 ENCOUNTER — Ambulatory Visit (HOSPITAL_BASED_OUTPATIENT_CLINIC_OR_DEPARTMENT_OTHER)
Admission: RE | Admit: 2023-12-07 | Discharge: 2023-12-07 | Disposition: A | Discharge status: Home / Self Care | Source: Ambulatory Visit | Attending: Emergency Medicine | Admitting: Emergency Medicine

## 2023-12-07 DIAGNOSIS — M7989 Other specified soft tissue disorders: Secondary | ICD-10-CM | POA: Diagnosis not present

## 2023-12-07 DIAGNOSIS — M545 Low back pain, unspecified: Secondary | ICD-10-CM

## 2023-12-07 DIAGNOSIS — M79605 Pain in left leg: Secondary | ICD-10-CM

## 2023-12-10 ENCOUNTER — Encounter: Payer: Self-pay | Admitting: Student

## 2023-12-10 ENCOUNTER — Ambulatory Visit (INDEPENDENT_AMBULATORY_CARE_PROVIDER_SITE_OTHER): Payer: Self-pay | Admitting: Student

## 2023-12-10 VITALS — BP 196/102 | HR 85 | Temp 99.1°F | Ht 66.0 in | Wt 366.4 lb

## 2023-12-10 DIAGNOSIS — G4733 Obstructive sleep apnea (adult) (pediatric): Secondary | ICD-10-CM | POA: Diagnosis not present

## 2023-12-10 DIAGNOSIS — M545 Low back pain, unspecified: Secondary | ICD-10-CM

## 2023-12-10 DIAGNOSIS — Z7984 Long term (current) use of oral hypoglycemic drugs: Secondary | ICD-10-CM | POA: Diagnosis not present

## 2023-12-10 DIAGNOSIS — G8929 Other chronic pain: Secondary | ICD-10-CM

## 2023-12-10 DIAGNOSIS — E119 Type 2 diabetes mellitus without complications: Secondary | ICD-10-CM

## 2023-12-10 DIAGNOSIS — Z8673 Personal history of transient ischemic attack (TIA), and cerebral infarction without residual deficits: Secondary | ICD-10-CM | POA: Diagnosis not present

## 2023-12-10 DIAGNOSIS — E1169 Type 2 diabetes mellitus with other specified complication: Secondary | ICD-10-CM

## 2023-12-10 DIAGNOSIS — I1A0 Resistant hypertension: Secondary | ICD-10-CM | POA: Diagnosis present

## 2023-12-10 DIAGNOSIS — G4730 Sleep apnea, unspecified: Secondary | ICD-10-CM

## 2023-12-10 MED ORDER — ACCU-CHEK AVIVA PLUS VI STRP: ORAL_STRIP | 12 refills | Status: DC

## 2023-12-10 MED ORDER — METHOCARBAMOL 750 MG PO TABS: 750.0000 mg | ORAL_TABLET | Freq: Three times a day (TID) | ORAL | 0 refills | Status: AC | PRN

## 2023-12-10 MED ORDER — CHLORTHALIDONE 50 MG PO TABS: 50.0000 mg | ORAL_TABLET | Freq: Every day | ORAL | 11 refills | Status: DC

## 2023-12-10 NOTE — Patient Instructions (Signed)
 Thank you, Ms.Cherylanne E Kreeger for allowing us  to provide your care today. Today we discussed  -Blood pressure: Increase chlorthalidone to 50 mg daily, continue with amlodipine, lisinopril and propanolol  -Back pain: Try robaxin (muscle relaxer) 750 mg every 8 hours as needed. Continue with Tylenol 1000 mg every 6-8 hours as needed. Avoid ibuprofen.  -Diabetes: Increase gradually metformin up to 1000 mg twice a day   Follow up:  2-4 weeks    Should you have any questions or concerns please call the internal medicine clinic at [phone].    Domique Reardon, D.O. [facility]

## 2023-12-10 NOTE — Assessment & Plan Note (Signed)
 Elevated STOP-BANG score with snoring, daytime fatigue, witnessed apnea. Uncontrolled HTN. Hx of OSA but no CPAP machine now.   Plan -Referral for Sleep Study placed today

## 2023-12-10 NOTE — Progress Notes (Signed)
 CC: back pain, HTN  HPI:  Maria Carpenter is a 43 y.o. female living with a history stated below and presents today for back pain, HTN. Please see problem based assessment and plan for additional details.  Past Medical History:  Diagnosis Date   Anemia    Asthma    Chlamydia 2012   treated   Complication of anesthesia    " they have trouble waking me up"   Diabetes mellitus    A2DM during pregnancy. now on metformin.    Dyspnea 05/07/2019   Dysuria 04/11/2019   Family history of anesthesia complication    mother & daughter    GERD (gastroesophageal reflux disease)    Gonorrhea 2010   treated   Hypertension    Lupus 10-2013   Morbid obesity (HCC) 07/22/2011   Pregnancy induced hypertension    pre E with pregnancies   Rheumatoid arthritis(714.0)    Shortness of breath    Sleep apnea    does not use cpap   Stroke [facility]) 2012   March 2012>right side deficit (speech, upper/ower weakness)   SVT (supraventricular tachycardia) (HCC)     Current Outpatient Medications on File Prior to Visit  Medication Sig Dispense Refill   Accu-Chek Softclix Lancets lancets Check blood sugar 3 times a day 100 each 11   albuterol (VENTOLIN HFA) 108 (90 Base) MCG/ACT inhaler Inhale 2 puffs into the lungs every 6 (six) hours as needed for wheezing or shortness of breath. 8 g 0   amLODipine (NORVASC) 10 MG tablet Take 1 tablet (10 mg total) by mouth daily. 90 tablet 1   Blood Glucose Monitoring Suppl (ACCU-CHEK GUIDE) w/Device KIT 1 each by Does not apply route 4 (four) times daily. Check blood sugar 3 times a day 1 kit 1   chlorthalidone (HYGROTON) 25 MG tablet Take 1 tablet (25 mg total) by mouth daily. 90 tablet 3   Cholecalciferol 25 MCG (1000 UT) capsule Take 1 capsule (1,000 Units total) by mouth daily. 60 capsule 2   Continuous Glucose Receiver (DEXCOM G6 RECEIVER) DEVI Use to check blood sugar at least 6 times a day 1 each 0   Continuous Glucose Sensor (DEXCOM G6 SENSOR) MISC Check  blood sugar at least 6 times a day 3 each 11   Continuous Glucose Transmitter (DEXCOM G6 TRANSMITTER) MISC Use to check blood sugar at least 6 times a day 1 each 3   diclofenac Sodium (VOLTAREN) 1 % GEL Apply 4 g topically 4 (four) times daily. 100 g 0   gabapentin (NEURONTIN) 300 MG capsule Take 1 capsule (300 mg total) by mouth at bedtime. 90 capsule 3   glucose blood (ACCU-CHEK GUIDE) test strip Check blood sugar 3 times daily. 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 100 each 3   lisinopril (ZESTRIL) 40 MG tablet Take 1 tablet (40 mg total) by mouth daily. 90 tablet 3   metFORMIN (GLUCOPHAGE) 500 MG tablet Take 2 tablets (1,000 mg total) by mouth 2 (two) times daily with a meal. 360 tablet 3   Multiple Vitamin (MULTIVITAMIN WITH MINERALS) TABS tablet Take 1 tablet by mouth daily.     nystatin powder Apply 1 Application topically 3 (three) times daily. 15 g 0  pantoprazole (PROTONIX) 20 MG tablet Take 1 tablet (20 mg total) by mouth daily. 30 tablet 1   Probiotic Product (PROBIOTIC PO) Take 1 capsule by mouth daily.     propranolol (INDERAL) 20 MG tablet Take 2 tablets (40 mg total) by mouth 2 (two) times daily. 360 tablet 1   rosuvastatin (CRESTOR) 20 MG tablet Take 1 tablet (20 mg total) by mouth daily. 90 tablet 3   Semaglutide,0.25 or 0.5MG /DOS, 2 MG/3ML SOPN Inject 0.25 mg into the skin once a week. 3 mL 1   Vitamin D, Ergocalciferol, (DRISDOL) 1.25 MG (50000 UNIT) CAPS capsule Take 1 capsule (50,000 Units total) by mouth every 7 (seven) days. 8 capsule 0   [DISCONTINUED] budesonide (PULMICORT) 180 MCG/ACT inhaler Inhale 1 puff into the lungs 2 (two) times daily. 1 each 1   No current facility-administered medications on file prior to visit.    Family History  Problem Relation  Age of Onset   Anesthesia problems Mother        difficult to arouse, SOB   Diabetes Mother    Hypertension Mother    Stroke Mother    Diabetes Father    Hypertension Father    Heart disease Father    Cancer Maternal Aunt    Cancer Maternal Grandmother    Breast cancer Maternal Grandmother    Breast cancer Cousin     Social History   Socioeconomic History   Marital status: Married    Spouse name: Not on file   Number of children: Not on file   Years of education: Not on file   Highest education level: Not on file  Occupational History   Not on file  Tobacco Use   Smoking status: Some Days    Current packs/day: 0.25    Average packs/day: 0.3 packs/day for 4.0 years (1.0 ttl pk-yrs)    Types: Cigarettes   Smokeless tobacco: Never   Tobacco comments:    15 cig/day  Vaping Use   Vaping status: Never Used  Substance and Sexual Activity   Alcohol use: Yes    Comment: socially   Drug use: Not Currently    Types: Marijuana    Comment: last month   Sexual activity: Yes    Birth control/protection: Other-see comments, Surgical  Other Topics Concern   Not on file  Social History Narrative   Not on file   Social Drivers of Health   Financial Resource Strain: Not on file  Food Insecurity: Not on file  Transportation Needs: Not on file  Physical Activity: Not on file  Stress: Not on file  Social Connections: Not on file  Intimate Partner Violence: Not on file    Review of Systems: ROS negative except for what is noted on the assessment and plan.  Vitals:   12/10/23 1558 12/10/23 1633  BP: (!) 179/114 (!) 196/102  Pulse: 87 85  Temp: 99.1 F (37.3 C)   TempSrc: Oral   SpO2: 99%   Weight: (!) 366 lb 6.4 oz (166.2 kg)   Height: 5\' 6"  (1.676 m)    Physical Exam: Constitutional: alert, sitting in chair, in no acute distress Cardiovascular: regular rate and rhythm, no murmur  Pulmonary/Chest: normal work of breathing on room air, lungs clear to auscultation  bilaterally MSK: Lumbar: no midline tenderness, TTP of left paraspinal with tight musculature, no radiation of pain, no overlying erythema Neurological: alert & oriented x 3  Assessment & Plan:   Resistant hypertension BP remains elevated on repeat of  systolic 180. Recently restarted BP meds of amlodipine 10 mg, chlorthalidone 25 mg, lisinopril 40 mg and propanolol 40 mg BID (for hx of SVT). Denies any hypertensive symptoms. No further chest pain from prior visit.   Plan -Increase chlorthalidone to 50 mg daily -Continue amlodipine 10, lisinopril 40 and propanolol 40 BID -F/u visit in 2-4 weeks, obtain BMP then  -If BP remains uncontrolled which likely, consider increase chlorthalidone dose or adding on spironolactone   Obstructive sleep apnea Elevated STOP-BANG score with snoring, daytime fatigue, witnessed apnea. Uncontrolled HTN. Hx of OSA but no CPAP machine now.   Plan -Referral for Sleep Study placed today   Diabetes (HCC) Recent A1c 8.5. Restarted metformin and tolerating 500 mg BID. Did not tolerate GLP-1 in past due to GI side effects. Discussed adding on SGLT-2 which patient states will consider/revisit at next visit. Gradually titrate up metformin.   Plan -Titrate up metformin to 1000 mg BID -Discuss SGLT-2 at next visit -A1c in 3 months   History of CVA (cerebrovascular accident) Reports hx of TIA in 2012. Was on ASA daily but at some point stopped and did not resume. Did not fully discuss with patient today due to other ongoing conditions so plan to re-evaluate ASA use at future OV.   Low back pain Reports lumbar back pain for over a week. No recent injury or trauma. No red flag symptoms. Exam with tight musculature on left. More consistent with MSK etiology at this time. Tried tylenol with some relief.   Plan -Continue Tylenol 1000 mg Q6-8H PRN, voltaren gel and supportive management  -Start Robaxin 750 mg TID PRN x 7 days  -Reassess at future visit, consider PT if  indicated   Patient discussed with Dr. Barbara Levins, D.O. [facility], PGY-2 Phone: [phone] Date 12/10/2023 Time 4:04 PM

## 2023-12-10 NOTE — Assessment & Plan Note (Signed)
 BP remains elevated on repeat of systolic 180. Recently restarted BP meds of amlodipine 10 mg, chlorthalidone 25 mg, lisinopril 40 mg and propanolol 40 mg BID (for hx of SVT). Denies any hypertensive symptoms. No further chest pain from prior visit.   Plan -Increase chlorthalidone to 50 mg daily -Continue amlodipine 10, lisinopril 40 and propanolol 40 BID -F/u visit in 2-4 weeks, obtain BMP then  -If BP remains uncontrolled which likely, consider increase chlorthalidone dose or adding on spironolactone

## 2023-12-10 NOTE — Assessment & Plan Note (Signed)
 Reports lumbar back pain for over a week. No recent injury or trauma. No red flag symptoms. Exam with tight musculature on left. More consistent with MSK etiology at this time. Tried tylenol with some relief.   Plan -Continue Tylenol 1000 mg Q6-8H PRN, voltaren gel and supportive management  -Start Robaxin 750 mg TID PRN x 7 days  -Reassess at future visit, consider PT if indicated

## 2023-12-10 NOTE — Assessment & Plan Note (Signed)
 Recent A1c 8.5. Restarted metformin and tolerating 500 mg BID. Did not tolerate GLP-1 in past due to GI side effects. Discussed adding on SGLT-2 which patient states will consider/revisit at next visit. Gradually titrate up metformin.   Plan -Titrate up metformin to 1000 mg BID -Discuss SGLT-2 at next visit -A1c in 3 months

## 2023-12-10 NOTE — Assessment & Plan Note (Signed)
 Reports hx of TIA in 2012. Was on ASA daily but at some point stopped and did not resume. Did not fully discuss with patient today due to other ongoing conditions so plan to re-evaluate ASA use at future OV.

## 2023-12-11 NOTE — Progress Notes (Signed)
 Internal Medicine Clinic Attending  Case discussed with the resident at the time of the visit.  We reviewed the resident's history and exam and pertinent patient test results.  I agree with the assessment, diagnosis, and plan of care documented in the resident's note.

## 2023-12-28 ENCOUNTER — Other Ambulatory Visit: Payer: Self-pay | Admitting: Student

## 2023-12-30 NOTE — Telephone Encounter (Signed)
 Pharmacy is requesting a 90 day supply

## 2023-12-31 ENCOUNTER — Other Ambulatory Visit: Payer: Self-pay | Admitting: Student

## 2023-12-31 DIAGNOSIS — E1169 Type 2 diabetes mellitus with other specified complication: Secondary | ICD-10-CM

## 2024-01-07 ENCOUNTER — Other Ambulatory Visit: Payer: Self-pay | Admitting: Student

## 2024-01-13 ENCOUNTER — Encounter (HOSPITAL_COMMUNITY): Payer: Self-pay

## 2024-01-13 ENCOUNTER — Ambulatory Visit (HOSPITAL_COMMUNITY)
Admission: EM | Admit: 2024-01-13 | Discharge: 2024-01-13 | Disposition: A | Discharge status: Home / Self Care | Attending: Emergency Medicine | Admitting: Emergency Medicine

## 2024-01-13 DIAGNOSIS — S025XXB Fracture of tooth (traumatic), initial encounter for open fracture: Secondary | ICD-10-CM

## 2024-01-13 DIAGNOSIS — K0889 Other specified disorders of teeth and supporting structures: Secondary | ICD-10-CM | POA: Diagnosis not present

## 2024-01-13 MED ORDER — IBUPROFEN 600 MG PO TABS: 600.0000 mg | ORAL_TABLET | Freq: Three times a day (TID) | ORAL | 0 refills | Status: DC | PRN

## 2024-01-13 MED ORDER — KETOROLAC TROMETHAMINE 30 MG/ML IJ SOLN
30.0000 mg | Freq: Once | INTRAMUSCULAR | Status: AC
  Administered 2024-01-13: 30 mg via INTRAMUSCULAR

## 2024-01-13 MED ORDER — CHLORHEXIDINE GLUCONATE 0.12 % MT SOLN: 15.0000 mL | Freq: Two times a day (BID) | OROMUCOSAL | 0 refills | Status: AC

## 2024-01-13 MED ORDER — CLINDAMYCIN HCL 150 MG PO CAPS: 450.0000 mg | ORAL_CAPSULE | Freq: Three times a day (TID) | ORAL | 0 refills | Status: AC

## 2024-01-13 MED ORDER — KETOROLAC TROMETHAMINE 30 MG/ML IJ SOLN
INTRAMUSCULAR | Status: AC
Start: 2024-01-13 — End: ?
  Filled 2024-01-13: qty 1

## 2024-01-13 MED ORDER — NYSTATIN 100000 UNIT/ML MT SUSP: 5.0000 mL | Freq: Four times a day (QID) | OROMUCOSAL | 0 refills | Status: AC | PRN

## 2024-01-13 NOTE — ED Triage Notes (Signed)
 Pt c/o lt upper/lower wisdom teeth pain x2wks. States took tylenol  with no relief.

## 2024-01-13 NOTE — Discharge Instructions (Addendum)
 You have received an injection of Toradol  in clinic today which is an anti-inflammatory to help with your pain. Start taking 3 capsules of clindamycin  3 times daily for 7 days for dental infection coverage. I have attached a list of dental resources that you can follow-up with for further evaluation. You could also try calling urgent tooth to see if you can get an appointment. Address is: [address] [place], [place], [place] [address] and phone: [phone]

## 2024-01-13 NOTE — ED Provider Notes (Signed)
 [facility]    CSN: [id number] Arrival date & time: 01/13/24  1925      History   Chief Complaint Chief Complaint  Patient presents with   Dental Pain    HPI Maria Carpenter is a 43 y.o. female.   Patient presents with left upper and lower dental pain x 2 weeks.  Patient states that she did notice that one of her right lower teeth was chipped.Patient states that she does not currently have a dentist that will take her insurance.  Patient states that she has been taking Tylenol  with no relief.  Patient states in the past she was told not to take medications like ibuprofen  due to previous kidney issues.    The history is provided by the patient and medical records.  Dental Pain   Past Medical History:  Diagnosis Date   Anemia    Asthma    Chlamydia 2012   treated   Complication of anesthesia    " they have trouble waking me up"   Diabetes mellitus    A2DM during pregnancy. now on metformin .    Dyspnea 05/07/2019   Dysuria 04/11/2019   Family history of anesthesia complication    mother & daughter    GERD (gastroesophageal reflux disease)    Gonorrhea 2010   treated   Hypertension    Lupus 10-2013   Morbid obesity (HCC) 07/22/2011   Pregnancy induced hypertension    pre E with pregnancies   Rheumatoid arthritis(714.0)    Shortness of breath    Sleep apnea    does not use cpap   Stroke [facility]) 2012   March 2012>right side deficit (speech, upper/ower weakness)   SVT (supraventricular tachycardia) [facility])     Patient Active Problem List   Diagnosis Date Noted   Abdominal pain 06/01/2022   Depression 06/29/2020   Left renal artery stenosis (HCC) 06/29/2020   Vitamin D  deficiency 06/29/2020   Low back pain 06/10/2019   Hidradenitis suppurativa 04/23/2019   Fatigue 04/11/2019   Asthma    Breast lump    Abnormal uterine bleeding (AUB) 07/25/2017   Systemic lupus erythematosus (HCC) 10/25/2013   TIA (transient ischemic attack) 07/02/2012   Diabetes  (HCC) 07/02/2012   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   GERD (gastroesophageal reflux disease) 07/02/2012   Obstructive sleep apnea 07/02/2012   CTS (carpal tunnel syndrome) 07/02/2012   Morbid obesity (HCC) 07/22/2011   History of CVA (cerebrovascular accident) 2012    Past Surgical History:  Procedure Laterality Date   CHOLECYSTECTOMY  07/22/2011   Procedure: LAPAROSCOPIC CHOLECYSTECTOMY WITH INTRAOPERATIVE CHOLANGIOGRAM;  Surgeon: Thayne Fine, MD;  Location: [facility];  Service: General;  Laterality: N/A;   ERCP  07/24/2011   Procedure: ENDOSCOPIC RETROGRADE CHOLANGIOPANCREATOGRAPHY (ERCP);  Surgeon: Almeda Aris;  Location: [facility];  Service: Endoscopy;  Laterality: N/A;   FINGER SURGERY  1993   left ring finger to correct bone "problem"   TUBAL LIGATION  2009    OB History     Gravida  10   Para  6   Term  0   Preterm  6   AB  3   Living  6      SAB  3   IAB  0   Ectopic  0   Multiple  0   Live Births  5            Home Medications    Prior to Admission medications   Medication Sig Start Date  End Date Taking? Authorizing Provider  chlorhexidine  (PERIDEX ) 0.12 % solution Use as directed 15 mLs in the mouth or throat 2 (two) times daily. 01/13/24  Yes Levora Reas A, NP  clindamycin  (CLEOCIN ) 150 MG capsule Take 3 capsules (450 mg total) by mouth 3 (three) times daily for 7 days. 01/13/24 01/20/24 Yes Rosevelt Constable, Obadiah Dennard A, NP  ibuprofen  (ADVIL ) 600 MG tablet Take 1 tablet (600 mg total) by mouth every 8 (eight) hours as needed for mild pain (pain score 1-3) or moderate pain (pain score 4-6). 01/13/24  Yes Levora Reas A, NP  magic mouthwash (nystatin , lidocaine , diphenhydrAMINE ) suspension Take 5 mLs by mouth 4 (four) times daily as needed for mouth pain. 01/13/24  Yes Levora Reas A, NP  ACCU-CHEK AVIVA PLUS test strip USE TO CHECK BLOOD SUGAR UP TO 4 TIMES A DAY. 01/01/24   Jonelle Neri, DO  Accu-Chek Softclix Lancets lancets Check blood sugar  3 times a day 12/04/23   Tawkaliyar, Roya, DO  albuterol  (VENTOLIN  HFA) 108 (90 Base) MCG/ACT inhaler TAKE 2 PUFFS BY MOUTH EVERY 6 HOURS AS NEEDED FOR WHEEZE OR SHORTNESS OF BREATH 12/30/23   Jonelle Neri, DO  amLODipine  (NORVASC ) 10 MG tablet Take 1 tablet (10 mg total) by mouth daily. 12/04/23   Tawkaliyar, Roya, DO  Blood Glucose Monitoring Suppl (ACCU-CHEK GUIDE) w/Device KIT 1 each by Does not apply route 4 (four) times daily. Check blood sugar 3 times a day 12/04/23   Tawkaliyar, Roya, DO  chlorthalidone  (HYGROTON ) 50 MG tablet Take 1 tablet (50 mg total) by mouth daily. 12/10/23   Zheng, Michael, DO  Cholecalciferol  25 MCG (1000 UT) capsule Take 1 capsule (1,000 Units total) by mouth daily. 06/01/22   Malen Scudder, DO  Continuous Glucose Receiver (DEXCOM G6 RECEIVER) DEVI Use to check blood sugar at least 6 times a day 12/04/23   Tawkaliyar, Roya, DO  Continuous Glucose Sensor (DEXCOM G6 SENSOR) MISC Check blood sugar at least 6 times a day 12/04/23   Tawkaliyar, Roya, DO  Continuous Glucose Transmitter (DEXCOM G6 TRANSMITTER) MISC Use to check blood sugar at least 6 times a day 12/04/23   Tawkaliyar, Roya, DO  diclofenac  Sodium (VOLTAREN ) 1 % GEL Apply 4 g topically 4 (four) times daily. 12/05/23   Albertus Hughs, DO  gabapentin  (NEURONTIN ) 300 MG capsule Take 1 capsule (300 mg total) by mouth at bedtime. 05/30/22   Malen Scudder, DO  hyoscyamine (LEVSIN) 0.125 MG tablet TAKE 1 TABLET BY MOUTH 3 TIMES A DAY AS NEEDED 04/19/20   McKenzie, Arden Beck, MD  Insulin  Pen Needle (PEN NEEDLES) 32G X 5 MM MISC 1 each by Does not apply route 5 (five) times daily. 07/16/19   Masoudi, Steffan Edison, MD  lisinopril  (ZESTRIL ) 40 MG tablet Take 1 tablet (40 mg total) by mouth daily. 12/04/23   Tawkaliyar, Roya, DO  metFORMIN  (GLUCOPHAGE ) 500 MG tablet Take 2 tablets (1,000 mg total) by mouth 2 (two) times daily with a meal. 12/04/23   Tawkaliyar, Roya, DO  Multiple Vitamin (MULTIVITAMIN WITH MINERALS) TABS tablet Take 1 tablet by mouth  daily.    [provider]  nystatin  (MYCOSTATIN /NYSTOP ) powder APPLY TOPICALLY 3 TIMES A DAY 01/07/24   Jonelle Neri, DO  pantoprazole  (PROTONIX ) 20 MG tablet TAKE 1 TABLET BY MOUTH EVERY DAY 12/30/23   Jonelle Neri, DO  Probiotic Product (PROBIOTIC PO) Take 1 capsule by mouth daily.    [provider]  propranolol  (INDERAL ) 20 MG tablet Take 2 tablets (40 mg total) by mouth  2 (two) times daily. 11/18/23   Jonelle Neri, DO  rosuvastatin  (CRESTOR ) 20 MG tablet Take 1 tablet (20 mg total) by mouth daily. 12/04/23 03/03/24  Tawkaliyar, Roya, DO  Vitamin D , Ergocalciferol , (DRISDOL ) 1.25 MG (50000 UNIT) CAPS capsule Take 1 capsule (50,000 Units total) by mouth every 7 (seven) days. 06/08/22   Malen Scudder, DO  budesonide  (PULMICORT ) 180 MCG/ACT inhaler Inhale 1 puff into the lungs 2 (two) times daily. 05/14/19 05/18/19  Thersia Flax, MD    Family History Family History  Problem Relation Age of Onset   Anesthesia problems Mother        difficult to arouse, SOB   Diabetes Mother    Hypertension Mother    Stroke Mother    Diabetes Father    Hypertension Father    Heart disease Father    Cancer Maternal Aunt    Cancer Maternal Grandmother    Breast cancer Maternal Grandmother    Breast cancer Cousin     Social History Social History   Tobacco Use   Smoking status: Some Days    Current packs/day: 0.25    Average packs/day: 0.3 packs/day for 4.0 years (1.0 ttl pk-yrs)    Types: Cigarettes   Smokeless tobacco: Never   Tobacco comments:    15 cig/day  Vaping Use   Vaping status: Never Used  Substance Use Topics   Alcohol use: Yes    Comment: socially   Drug use: Not Currently    Types: Marijuana    Comment: last month     Allergies   Amoxicillin, Cashew nut (anacardium occidentale) skin test, Dilaudid  [hydromorphone  hcl], Egg-derived products, and Pineapple   Review of Systems Review of Systems  Per HPI  Physical Exam Triage Vital Signs ED Triage Vitals   Encounter Vitals Group     BP 01/13/24 2018 (!) 218/148     Systolic BP Percentile --      Diastolic BP Percentile --      Pulse Rate 01/13/24 2018 81     Resp 01/13/24 2018 18     Temp 01/13/24 2018 98.5 F (36.9 C)     Temp Source 01/13/24 2018 Oral     SpO2 01/13/24 2018 99 %     Weight --      Height --      Head Circumference --      Peak Flow --      Pain Score 01/13/24 2019 6     Pain Loc --      Pain Education --      Exclude from Growth Chart --    No data found.  Updated Vital Signs BP (!) 218/148 (BP Location: Right Arm)   Pulse 81   Temp 98.5 F (36.9 C) (Oral)   Resp 18   LMP 01/06/2024   SpO2 99%   Breastfeeding No   Visual Acuity Right Eye Distance:   Left Eye Distance:   Bilateral Distance:    Right Eye Near:   Left Eye Near:    Bilateral Near:     Physical Exam Vitals and nursing note reviewed.  Constitutional:      General: She is awake. She is not in acute distress.    Appearance: Normal appearance. She is well-developed and well-groomed. She is not ill-appearing.  HENT:     Mouth/Throat:     Dentition: Abnormal dentition. Dental tenderness, gingival swelling and dental caries present.     Comments: Dental carry noted to left upper back molar.  Left lower second molar appears with open fracture.  There is some mild gingival swelling and dental tenderness noted. Skin:    General: Skin is warm and dry.  Neurological:     Mental Status: She is alert.  Psychiatric:        Behavior: Behavior is cooperative.      UC Treatments / Results  Labs (all labs ordered are listed, but only abnormal results are displayed) Labs Reviewed - No data to display  EKG   Radiology No results found.  Procedures Procedures (including critical care time)  Medications Ordered in UC Medications  ketorolac  (TORADOL ) 30 MG/ML injection 30 mg (30 mg Intramuscular Given 01/13/24 2044)    Initial Impression / Assessment and Plan / UC Course  I have  reviewed the triage vital signs and the nursing notes.  Pertinent labs & imaging results that were available during my care of the patient were reviewed by me and considered in my medical decision making (see chart for details).     Patient is well-appearing.  Blood pressure is elevated at 218/148.  Patient states that she is in a lot of pain which is why her blood pressure is elevated.  Patient states that she has been taking her blood pressure medication as prescribed.  Upon assessment dental carry noted to left upper back molar.  Lower second molar appears with open fracture.  There is some mild gingival swelling and dental tenderness noted around the molar.  Evaluated recent lab work which did not reveal any signs of kidney disease.  Given IM Toradol  in clinic.  Prescribed a few days 600 mg ibuprofen  as needed for pain.  Prescribed clindamycin  for dental infection coverage.  Prescribed Peridex  and Magic mouthwash for additional infection coverage as well as pain management.  Given dental resources.  Discussed follow-up precautions. Final Clinical Impressions(s) / UC Diagnoses   Final diagnoses:  Pain, dental  Open fracture of tooth, initial encounter     Discharge Instructions      You have received an injection of Toradol  in clinic today which is an anti-inflammatory to help with your pain. Start taking 3 capsules of clindamycin  3 times daily for 7 days for dental infection coverage. I have attached a list of dental resources that you can follow-up with for further evaluation. You could also try calling urgent tooth to see if you can get an appointment. Address is: [address] [place], [place], [place] [address] and phone: [phone]  ED Prescriptions     Medication Sig Dispense Auth. Provider   clindamycin  (CLEOCIN ) 150 MG capsule Take 3 capsules (450 mg total) by mouth 3 (three) times daily for 7 days. 63 capsule Levora Reas A, NP   magic mouthwash (nystatin , lidocaine ,  diphenhydrAMINE ) suspension Take 5 mLs by mouth 4 (four) times daily as needed for mouth pain. 180 mL Levora Reas A, NP   chlorhexidine  (PERIDEX ) 0.12 % solution Use as directed 15 mLs in the mouth or throat 2 (two) times daily. 120 mL Levora Reas A, NP   ibuprofen  (ADVIL ) 600 MG tablet Take 1 tablet (600 mg total) by mouth every 8 (eight) hours as needed for mild pain (pain score 1-3) or moderate pain (pain score 4-6). 15 tablet Levora Reas A, NP      PDMP not reviewed this encounter.   Levora Reas A, NP 01/13/24 2055

## 2024-01-14 ENCOUNTER — Emergency Department (HOSPITAL_BASED_OUTPATIENT_CLINIC_OR_DEPARTMENT_OTHER)
Admission: EM | Admit: 2024-01-14 | Discharge: 2024-01-14 | Disposition: A | Discharge status: Home / Self Care | Attending: Emergency Medicine | Admitting: Emergency Medicine

## 2024-01-14 ENCOUNTER — Other Ambulatory Visit: Payer: Self-pay

## 2024-01-14 ENCOUNTER — Other Ambulatory Visit (HOSPITAL_BASED_OUTPATIENT_CLINIC_OR_DEPARTMENT_OTHER): Payer: Self-pay

## 2024-01-14 ENCOUNTER — Encounter (HOSPITAL_BASED_OUTPATIENT_CLINIC_OR_DEPARTMENT_OTHER): Payer: Self-pay | Admitting: Emergency Medicine

## 2024-01-14 DIAGNOSIS — E119 Type 2 diabetes mellitus without complications: Secondary | ICD-10-CM | POA: Diagnosis not present

## 2024-01-14 DIAGNOSIS — K029 Dental caries, unspecified: Secondary | ICD-10-CM | POA: Diagnosis not present

## 2024-01-14 DIAGNOSIS — Z7984 Long term (current) use of oral hypoglycemic drugs: Secondary | ICD-10-CM | POA: Insufficient documentation

## 2024-01-14 DIAGNOSIS — I10 Essential (primary) hypertension: Secondary | ICD-10-CM | POA: Diagnosis not present

## 2024-01-14 DIAGNOSIS — Z79899 Other long term (current) drug therapy: Secondary | ICD-10-CM | POA: Diagnosis not present

## 2024-01-14 DIAGNOSIS — Z794 Long term (current) use of insulin: Secondary | ICD-10-CM | POA: Insufficient documentation

## 2024-01-14 DIAGNOSIS — K0889 Other specified disorders of teeth and supporting structures: Secondary | ICD-10-CM | POA: Diagnosis not present

## 2024-01-14 MED ORDER — OXYCODONE HCL 5 MG PO TABS
5.0000 mg | ORAL_TABLET | Freq: Once | ORAL | Status: AC
  Administered 2024-01-14: 5 mg via ORAL
  Filled 2024-01-14: qty 1

## 2024-01-14 MED ORDER — OXYCODONE HCL 5 MG PO TABS
5.0000 mg | ORAL_TABLET | Freq: Four times a day (QID) | ORAL | 0 refills | Status: AC | PRN
  Filled 2024-01-14: qty 10, 3d supply, fill #0

## 2024-01-14 NOTE — ED Provider Notes (Signed)
 [place] EMERGENCY DEPARTMENT AT [facility] Provider Note   CSN: [id number] Arrival date & time: 01/14/24  1534     History  Chief Complaint  Patient presents with   Dental Pain    Maria Carpenter is a 43 y.o. female.  Patient here with left lower dental pain started antibiotics yesterday.  Has dentist appointment tomorrow.  Over-the-counter medications have not helped.  Denies any fever.  Denies any weakness numbness tingling.  History of diabetes hypertension.  The history is provided by the patient.       Home Medications Prior to Admission medications   Medication Sig Start Date End Date Taking? Authorizing Provider  oxyCODONE  (ROXICODONE ) 5 MG immediate release tablet Take 1 tablet (5 mg total) by mouth every 6 (six) hours as needed for up to 10 doses. 01/14/24  Yes Leiah Giannotti, DO  ACCU-CHEK AVIVA PLUS test strip USE TO CHECK BLOOD SUGAR UP TO 4 TIMES A DAY. 01/01/24   Jonelle Neri, DO  Accu-Chek Softclix Lancets lancets Check blood sugar 3 times a day 12/04/23   Tawkaliyar, Roya, DO  albuterol  (VENTOLIN  HFA) 108 (90 Base) MCG/ACT inhaler TAKE 2 PUFFS BY MOUTH EVERY 6 HOURS AS NEEDED FOR WHEEZE OR SHORTNESS OF BREATH 12/30/23   Jonelle Neri, DO  amLODipine  (NORVASC ) 10 MG tablet Take 1 tablet (10 mg total) by mouth daily. 12/04/23   Tawkaliyar, Roya, DO  Blood Glucose Monitoring Suppl (ACCU-CHEK GUIDE) w/Device KIT 1 each by Does not apply route 4 (four) times daily. Check blood sugar 3 times a day 12/04/23   Tawkaliyar, Roya, DO  chlorhexidine  (PERIDEX ) 0.12 % solution Use as directed 15 mLs in the mouth or throat 2 (two) times daily. 01/13/24   Levora Reas A, NP  chlorthalidone  (HYGROTON ) 50 MG tablet Take 1 tablet (50 mg total) by mouth daily. 12/10/23   Zheng, Michael, DO  Cholecalciferol  25 MCG (1000 UT) capsule Take 1 capsule (1,000 Units total) by mouth daily. 06/01/22   Malen Scudder, DO  clindamycin  (CLEOCIN ) 150 MG capsule Take 3 capsules (450 mg total) by  mouth 3 (three) times daily for 7 days. 01/13/24 01/20/24  Levora Reas A, NP  Continuous Glucose Receiver (DEXCOM G6 RECEIVER) DEVI Use to check blood sugar at least 6 times a day 12/04/23   Tawkaliyar, Roya, DO  Continuous Glucose Sensor (DEXCOM G6 SENSOR) MISC Check blood sugar at least 6 times a day 12/04/23   Tawkaliyar, Roya, DO  Continuous Glucose Transmitter (DEXCOM G6 TRANSMITTER) MISC Use to check blood sugar at least 6 times a day 12/04/23   Tawkaliyar, Roya, DO  diclofenac  Sodium (VOLTAREN ) 1 % GEL Apply 4 g topically 4 (four) times daily. 12/05/23   Albertus Hughs, DO  gabapentin  (NEURONTIN ) 300 MG capsule Take 1 capsule (300 mg total) by mouth at bedtime. 05/30/22   Malen Scudder, DO  hyoscyamine (LEVSIN) 0.125 MG tablet TAKE 1 TABLET BY MOUTH 3 TIMES A DAY AS NEEDED 04/19/20   McKenzie, Arden Beck, MD  ibuprofen  (ADVIL ) 600 MG tablet Take 1 tablet (600 mg total) by mouth every 8 (eight) hours as needed for mild pain (pain score 1-3) or moderate pain (pain score 4-6). 01/13/24   Levora Reas A, NP  Insulin  Pen Needle (PEN NEEDLES) 32G X 5 MM MISC 1 each by Does not apply route 5 (five) times daily. 07/16/19   Masoudi, Elhamalsadat, MD  lisinopril  (ZESTRIL ) 40 MG tablet Take 1 tablet (40 mg total) by mouth daily. 12/04/23   Lanney Pitts,  DO  magic mouthwash (nystatin , lidocaine , diphenhydrAMINE ) suspension Take 5 mLs by mouth 4 (four) times daily as needed for mouth pain. 01/13/24   Levora Reas A, NP  metFORMIN  (GLUCOPHAGE ) 500 MG tablet Take 2 tablets (1,000 mg total) by mouth 2 (two) times daily with a meal. 12/04/23   Tawkaliyar, Roya, DO  Multiple Vitamin (MULTIVITAMIN WITH MINERALS) TABS tablet Take 1 tablet by mouth daily.    [provider]  nystatin  (MYCOSTATIN /NYSTOP ) powder APPLY TOPICALLY 3 TIMES A DAY 01/07/24   Jonelle Neri, DO  pantoprazole  (PROTONIX ) 20 MG tablet TAKE 1 TABLET BY MOUTH EVERY DAY 12/30/23   Jonelle Neri, DO  Probiotic Product (PROBIOTIC PO) Take 1 capsule by  mouth daily.    [provider]  propranolol  (INDERAL ) 20 MG tablet Take 2 tablets (40 mg total) by mouth 2 (two) times daily. 11/18/23   Jonelle Neri, DO  rosuvastatin  (CRESTOR ) 20 MG tablet Take 1 tablet (20 mg total) by mouth daily. 12/04/23 03/03/24  Tawkaliyar, Roya, DO  Vitamin D , Ergocalciferol , (DRISDOL ) 1.25 MG (50000 UNIT) CAPS capsule Take 1 capsule (50,000 Units total) by mouth every 7 (seven) days. 06/08/22   Malen Scudder, DO  budesonide  (PULMICORT ) 180 MCG/ACT inhaler Inhale 1 puff into the lungs 2 (two) times daily. 05/14/19 05/18/19  Thersia Flax, MD      Allergies    Amoxicillin, Cashew nut (anacardium occidentale) skin test, Dilaudid  [hydromorphone  hcl], Egg-derived products, and Pineapple    Review of Systems   Review of Systems  Physical Exam Updated Vital Signs BP (!) 239/146 (BP Location: Right Arm)   Pulse 91   Temp 98 F (36.7 C) (Temporal)   Resp 20   LMP 01/06/2024   SpO2 100%  Physical Exam Vitals and nursing note reviewed.  Constitutional:      General: She is not in acute distress.    Appearance: She is well-developed.  HENT:     Head: Normocephalic and atraumatic.     Comments: Dental caries throughout but worse in the left lower mouth but no abscess no trismus no drooling no submandibular swelling or facial swelling Eyes:     Conjunctiva/sclera: Conjunctivae normal.  Cardiovascular:     Rate and Rhythm: Normal rate and regular rhythm.     Heart sounds: No murmur heard. Pulmonary:     Effort: Pulmonary effort is normal. No respiratory distress.     Breath sounds: Normal breath sounds.  Abdominal:     Palpations: Abdomen is soft.     Tenderness: There is no abdominal tenderness.  Musculoskeletal:        General: No swelling.     Cervical back: Neck supple.  Skin:    General: Skin is warm and dry.     Capillary Refill: Capillary refill takes less than 2 seconds.  Neurological:     Mental Status: She is alert.  Psychiatric:        Mood  and Affect: Mood normal.     ED Results / Procedures / Treatments   Labs (all labs ordered are listed, but only abnormal results are displayed) Labs Reviewed - No data to display  EKG None  Radiology No results found.  Procedures Procedures    Medications Ordered in ED Medications  oxyCODONE  (Oxy IR/ROXICODONE ) immediate release tablet 5 mg (has no administration in time range)    ED Course/ Medical Decision Making/ A&P  Medical Decision Making Risk Prescription drug management.   Christie Cox is here with dental pain.  No fever.  She is had a lot of pain on exam but there is no trismus no drooling no facial swelling.  She started clindamycin  yesterday for presumed dental caries supposed to see dentist tomorrow.  I do not think blood pressure is accurate she is uncomfortable.  I have no concern for blood pressure emergency.  I have no concern for deep space infectious process.  Will give her oxycodone  and prescribe her some and have her follow-up with dentistry as scheduled.  Discharge.  This chart was dictated using voice recognition software.  Despite best efforts to proofread,  errors can occur which can change the documentation meaning.         Final Clinical Impression(s) / ED Diagnoses Final diagnoses:  Pain due to dental caries    Rx / DC Orders ED Discharge Orders          Ordered    oxyCODONE  (ROXICODONE ) 5 MG immediate release tablet  Every 6 hours PRN        01/14/24 1617              Lowery Rue, DO 01/14/24 1619

## 2024-01-14 NOTE — ED Triage Notes (Signed)
 Left side dental pain. Seen at [facility] for same yesterday, given abt and toradol .  Continued pain today. Dental apt tomorrow

## 2024-04-12 ENCOUNTER — Emergency Department (HOSPITAL_BASED_OUTPATIENT_CLINIC_OR_DEPARTMENT_OTHER)
Admission: EM | Admit: 2024-04-12 | Discharge: 2024-04-12 | Disposition: A | Discharge status: Home / Self Care | Attending: Emergency Medicine | Admitting: Emergency Medicine

## 2024-04-12 ENCOUNTER — Encounter (HOSPITAL_BASED_OUTPATIENT_CLINIC_OR_DEPARTMENT_OTHER): Payer: Self-pay

## 2024-04-12 ENCOUNTER — Other Ambulatory Visit: Payer: Self-pay

## 2024-04-12 DIAGNOSIS — R739 Hyperglycemia, unspecified: Secondary | ICD-10-CM | POA: Diagnosis present

## 2024-04-12 DIAGNOSIS — Z794 Long term (current) use of insulin: Secondary | ICD-10-CM | POA: Insufficient documentation

## 2024-04-12 DIAGNOSIS — E1165 Type 2 diabetes mellitus with hyperglycemia: Secondary | ICD-10-CM | POA: Diagnosis not present

## 2024-04-12 DIAGNOSIS — Z7984 Long term (current) use of oral hypoglycemic drugs: Secondary | ICD-10-CM | POA: Diagnosis not present

## 2024-04-12 LAB — CBC WITH DIFFERENTIAL/PLATELET
Abs Immature Granulocytes: 0.03 K/uL (ref 0.00–0.07)
Basophils Absolute: 0 K/uL (ref 0.0–0.1)
Basophils Relative: 0 %
Eosinophils Absolute: 0.1 K/uL (ref 0.0–0.5)
Eosinophils Relative: 1 %
HCT: 31.8 % — ABNORMAL LOW (ref 36.0–46.0)
Hemoglobin: 10 g/dL — ABNORMAL LOW (ref 12.0–15.0)
Immature Granulocytes: 0 %
Lymphocytes Relative: 30 %
Lymphs Abs: 2.5 K/uL (ref 0.7–4.0)
MCH: 24.2 pg — ABNORMAL LOW (ref 26.0–34.0)
MCHC: 31.4 g/dL (ref 30.0–36.0)
MCV: 77 fL — ABNORMAL LOW (ref 80.0–100.0)
Monocytes Absolute: 0.7 K/uL (ref 0.1–1.0)
Monocytes Relative: 8 %
Neutro Abs: 5 K/uL (ref 1.7–7.7)
Neutrophils Relative %: 61 %
Platelets: 264 K/uL (ref 150–400)
RBC: 4.13 MIL/uL (ref 3.87–5.11)
RDW: 17.3 % — ABNORMAL HIGH (ref 11.5–15.5)
WBC: 8.3 K/uL (ref 4.0–10.5)
nRBC: 0 % (ref 0.0–0.2)

## 2024-04-12 LAB — COMPREHENSIVE METABOLIC PANEL WITH GFR
ALT: 9 U/L (ref 0–44)
AST: 15 U/L (ref 15–41)
Albumin: 4 g/dL (ref 3.5–5.0)
Alkaline Phosphatase: 96 U/L (ref 38–126)
Anion gap: 11 (ref 5–15)
BUN: 13 mg/dL (ref 6–20)
CO2: 25 mmol/L (ref 22–32)
Calcium: 9.2 mg/dL (ref 8.9–10.3)
Chloride: 99 mmol/L (ref 98–111)
Creatinine, Ser: 0.95 mg/dL (ref 0.44–1.00)
GFR, Estimated: >60 mL/min (ref >60)
Glucose, Bld: 185 mg/dL — ABNORMAL HIGH (ref 70–99)
Potassium: 4 mmol/L (ref 3.5–5.1)
Sodium: 134 mmol/L — ABNORMAL LOW (ref 135–145)
Total Bilirubin: 0.2 mg/dL (ref 0.0–1.2)
Total Protein: 7.3 g/dL (ref 6.5–8.1)

## 2024-04-12 LAB — I-STAT VENOUS BLOOD GAS, ED
Acid-Base Excess: 2 mmol/L (ref 0.0–2.0)
Bicarbonate: 27.3 mmol/L (ref 20.0–28.0)
Calcium, Ion: 1.25 mmol/L (ref 1.15–1.40)
HCT: 34 % — ABNORMAL LOW (ref 36.0–46.0)
Hemoglobin: 11.6 g/dL — ABNORMAL LOW (ref 12.0–15.0)
O2 Saturation: 58 %
Patient temperature: 98.6
Potassium: 3.9 mmol/L (ref 3.5–5.1)
Sodium: 135 mmol/L (ref 135–145)
TCO2: 29 mmol/L (ref 22–32)
pCO2, Ven: 46.2 mmHg (ref 44–60)
pH, Ven: 7.379 (ref 7.25–7.43)
pO2, Ven: 31 mmHg — CL (ref 32–45)

## 2024-04-12 LAB — CBG MONITORING, ED
Glucose-Capillary: 228 mg/dL — ABNORMAL HIGH (ref 70–99)
Glucose-Capillary: 211 mg/dL — ABNORMAL HIGH (ref 70–99)

## 2024-04-12 MED ORDER — INSULIN ASPART 100 UNIT/ML IJ SOLN
5.0000 [IU] | Freq: Once | INTRAMUSCULAR | Status: AC
  Administered 2024-04-12: 5 [IU] via SUBCUTANEOUS

## 2024-04-12 MED ORDER — SODIUM CHLORIDE 0.9 % IV BOLUS
1000.0000 mL | Freq: Once | INTRAVENOUS | Status: AC
  Administered 2024-04-12: 1000 mL via INTRAVENOUS

## 2024-04-12 MED ORDER — LABETALOL HCL 5 MG/ML IV SOLN
15.0000 mg | Freq: Once | INTRAVENOUS | Status: AC
  Administered 2024-04-12: 15 mg via INTRAVENOUS
  Filled 2024-04-12: qty 4

## 2024-04-12 MED ORDER — INSULIN GLARGINE 100 UNIT/ML ~~LOC~~ SOLN
10.0000 [IU] | Freq: Every day | SUBCUTANEOUS | 0 refills | Status: DC
Start: 2024-04-12 — End: 2024-04-16

## 2024-04-12 NOTE — Discharge Instructions (Signed)
 Start Lantus  as prescribed.  Follow-up with your primary care doctors regarding your blood pressure and further diabetes control.  Return if symptoms worsen.

## 2024-04-12 NOTE — ED Notes (Signed)
 Dc instructions given, PIV removed. Pt verbalized understanding. Out of ED with all belongings and paperwork not in visible distress.

## 2024-04-12 NOTE — ED Provider Notes (Signed)
 [place] EMERGENCY DEPARTMENT AT [facility] Provider Note   CSN: [id number] Arrival date & time: 04/12/24  1750     Patient presents with: Hyperglycemia   Maria Carpenter is a 43 y.o. female.   Patient here with concern for elevated blood sugar.  She used to be on NovoLog  and Lantus  but now she is only on metformin .  She has noticed from her Dexcom that her blood sugars been in the 300 range here recently.  She has a follow-up with her primary care doctor on Thursday.  Blood pressure has been elevated at times she has been compliant with her medications.  She denies any nausea vomiting chest pain shortness of breath weakness numbness tingling.  No nausea vomiting diarrhea.  She takes lisinopril , amlodipine  chlorthalidone .  No weakness numbness tingling.  No stroke symptoms.  No chest pain.  No headache.  The history is provided by the patient.       Prior to Admission medications   Medication Sig Start Date End Date Taking? Authorizing Provider  insulin  glargine (LANTUS ) 100 UNIT/ML injection Inject 0.1 mLs (10 Units total) into the skin daily. 04/12/24  Yes Esteen Delpriore, DO  ACCU-CHEK AVIVA PLUS test strip USE TO CHECK BLOOD SUGAR UP TO 4 TIMES A DAY. 01/01/24   Tobie Gaines, DO  Accu-Chek Softclix Lancets lancets Check blood sugar 3 times a day 12/04/23   Tawkaliyar, Roya, DO  albuterol  (VENTOLIN  HFA) 108 (90 Base) MCG/ACT inhaler TAKE 2 PUFFS BY MOUTH EVERY 6 HOURS AS NEEDED FOR WHEEZE OR SHORTNESS OF BREATH 12/30/23   Tobie Gaines, DO  amLODipine  (NORVASC ) 10 MG tablet Take 1 tablet (10 mg total) by mouth daily. 12/04/23   Tawkaliyar, Roya, DO  Blood Glucose Monitoring Suppl (ACCU-CHEK GUIDE) w/Device KIT 1 each by Does not apply route 4 (four) times daily. Check blood sugar 3 times a day 12/04/23   Tawkaliyar, Roya, DO  chlorhexidine  (PERIDEX ) 0.12 % solution Use as directed 15 mLs in the mouth or throat 2 (two) times daily. 01/13/24   Johnie Flaming A, NP  chlorthalidone   (HYGROTON ) 50 MG tablet Take 1 tablet (50 mg total) by mouth daily. 12/10/23   Zheng, Michael, DO  Cholecalciferol  25 MCG (1000 UT) capsule Take 1 capsule (1,000 Units total) by mouth daily. 06/01/22   Addie Perkins, DO  Continuous Glucose Receiver (DEXCOM G6 RECEIVER) DEVI Use to check blood sugar at least 6 times a day 12/04/23   Tawkaliyar, Roya, DO  Continuous Glucose Sensor (DEXCOM G6 SENSOR) MISC Check blood sugar at least 6 times a day 12/04/23   Tawkaliyar, Roya, DO  Continuous Glucose Transmitter (DEXCOM G6 TRANSMITTER) MISC Use to check blood sugar at least 6 times a day 12/04/23   Tawkaliyar, Roya, DO  diclofenac  Sodium (VOLTAREN ) 1 % GEL Apply 4 g topically 4 (four) times daily. 12/05/23   Emil Share, DO  gabapentin  (NEURONTIN ) 300 MG capsule Take 1 capsule (300 mg total) by mouth at bedtime. 05/30/22   Addie Perkins, DO  hyoscyamine (LEVSIN) 0.125 MG tablet TAKE 1 TABLET BY MOUTH 3 TIMES A DAY AS NEEDED 04/19/20   McKenzie, Belvie CROME, MD  ibuprofen  (ADVIL ) 600 MG tablet Take 1 tablet (600 mg total) by mouth every 8 (eight) hours as needed for mild pain (pain score 1-3) or moderate pain (pain score 4-6). 01/13/24   Johnie Flaming A, NP  Insulin  Pen Needle (PEN NEEDLES) 32G X 5 MM MISC 1 each by Does not apply route 5 (five) times daily.  07/16/19   Masoudi, Elhamalsadat, MD  lisinopril  (ZESTRIL ) 40 MG tablet Take 1 tablet (40 mg total) by mouth daily. 12/04/23   Tawkaliyar, Toma, DO  magic mouthwash (nystatin , lidocaine , diphenhydrAMINE ) suspension Take 5 mLs by mouth 4 (four) times daily as needed for mouth pain. 01/13/24   Johnie Flaming A, NP  metFORMIN  (GLUCOPHAGE ) 500 MG tablet Take 2 tablets (1,000 mg total) by mouth 2 (two) times daily with a meal. 12/04/23   Tawkaliyar, Roya, DO  Multiple Vitamin (MULTIVITAMIN WITH MINERALS) TABS tablet Take 1 tablet by mouth daily.    [provider]  nystatin  (MYCOSTATIN /NYSTOP ) powder APPLY TOPICALLY 3 TIMES A DAY 01/07/24   Tobie Gaines, DO  oxyCODONE   (ROXICODONE ) 5 MG immediate release tablet Take 1 tablet (5 mg total) by mouth every 6 (six) hours as needed for up to 10 doses. 01/14/24   Treveon Bourcier, DO  pantoprazole  (PROTONIX ) 20 MG tablet TAKE 1 TABLET BY MOUTH EVERY DAY 12/30/23   Tobie Gaines, DO  Probiotic Product (PROBIOTIC PO) Take 1 capsule by mouth daily.    [provider]  propranolol  (INDERAL ) 20 MG tablet Take 2 tablets (40 mg total) by mouth 2 (two) times daily. 11/18/23   Tobie Gaines, DO  rosuvastatin  (CRESTOR ) 20 MG tablet Take 1 tablet (20 mg total) by mouth daily. 12/04/23 03/03/24  Tawkaliyar, Roya, DO  Vitamin D , Ergocalciferol , (DRISDOL ) 1.25 MG (50000 UNIT) CAPS capsule Take 1 capsule (50,000 Units total) by mouth every 7 (seven) days. 06/08/22   Addie Perkins, DO  budesonide  (PULMICORT ) 180 MCG/ACT inhaler Inhale 1 puff into the lungs 2 (two) times daily. 05/14/19 05/18/19  Kelby Schatz, MD    Allergies: Amoxicillin, Cashew nut (anacardium occidentale) skin test, Dilaudid  [hydromorphone  hcl], Egg-derived products, and Pineapple    Review of Systems  Updated Vital Signs BP (!) 194/112   Pulse 91   Temp 98.6 F (37 C)   Resp 18   Ht 5' 6 (1.676 m)   Wt (!) 163.3 kg   SpO2 100%   BMI 58.11 kg/m   Physical Exam Vitals and nursing note reviewed.  Constitutional:      General: She is not in acute distress.    Appearance: She is well-developed. She is not ill-appearing.  HENT:     Head: Normocephalic and atraumatic.     Nose: Nose normal.     Mouth/Throat:     Mouth: Mucous membranes are moist.  Eyes:     Extraocular Movements: Extraocular movements intact.     Conjunctiva/sclera: Conjunctivae normal.     Pupils: Pupils are equal, round, and reactive to light.  Cardiovascular:     Rate and Rhythm: Normal rate and regular rhythm.     Pulses: Normal pulses.     Heart sounds: Normal heart sounds. No murmur heard. Pulmonary:     Effort: Pulmonary effort is normal. No respiratory distress.     Breath  sounds: Normal breath sounds.  Abdominal:     General: Abdomen is flat.     Palpations: Abdomen is soft.     Tenderness: There is no abdominal tenderness.  Musculoskeletal:        General: No swelling.     Cervical back: Normal range of motion and neck supple.  Skin:    General: Skin is warm and dry.     Capillary Refill: Capillary refill takes less than 2 seconds.  Neurological:     General: No focal deficit present.     Mental Status: She is  alert and oriented to person, place, and time.     Cranial Nerves: No cranial nerve deficit.     Sensory: No sensory deficit.     Motor: No weakness.     Coordination: Coordination normal.  Psychiatric:        Mood and Affect: Mood normal.     (all labs ordered are listed, but only abnormal results are displayed) Labs Reviewed  CBC WITH DIFFERENTIAL/PLATELET - Abnormal; Notable for the following components:      Result Value   Hemoglobin 10.0 (*)    HCT 31.8 (*)    MCV 77.0 (*)    MCH 24.2 (*)    RDW 17.3 (*)    All other components within normal limits  COMPREHENSIVE METABOLIC PANEL WITH GFR - Abnormal; Notable for the following components:   Sodium 134 (*)    Glucose, Bld 185 (*)    All other components within normal limits  CBG MONITORING, ED - Abnormal; Notable for the following components:   Glucose-Capillary 228 (*)    All other components within normal limits  I-STAT VENOUS BLOOD GAS, ED - Abnormal; Notable for the following components:   pO2, Ven 31 (*)    HCT 34.0 (*)    Hemoglobin 11.6 (*)    All other components within normal limits  CBG MONITORING, ED - Abnormal; Notable for the following components:   Glucose-Capillary 211 (*)    All other components within normal limits  BLOOD GAS, VENOUS    EKG: None  Radiology: No results found.   Procedures   Medications Ordered in the ED  sodium chloride  0.9 % bolus 1,000 mL (0 mLs Intravenous Stopped 04/12/24 2031)  labetalol  (NORMODYNE ) injection 15 mg (15 mg  Intravenous Given 04/12/24 1854)  insulin  aspart (novoLOG ) injection 5 Units (5 Units Subcutaneous Given 04/12/24 1921)                                    Medical Decision Making Amount and/or Complexity of Data Reviewed Labs: ordered.  Risk Prescription drug management.   Almarie FORBES Ates is here with concern for high blood sugar.  She is not on insulin  anymore.  Blood sugars been in the 300s per her Dexcom.  Blood pressure has been elevated as well but she has no headache chest pain stroke symptoms.  Supposed to follow-up with her primary care doctor this week about it.  She is only on metformin .  Blood sugar today in the 200s.  Her pH is normal.  She is not in DKA.  Will give her IV fluids give her some insulin  and talk with her primary care team about maybe restarting her on some diabetes medicine.  I will try to call them to get their opinion about whether starting some sort of oral hypoglycemic medicine or maybe putting her back on some insulin .  Given her dose of IV labetalol  as well.  Overall per my review and interpretation of labs patient is not in DKA.  Blood sugars improved to 211.  I talked with her primary doctor/internal medicine team who recommend starting her on 10 units of Lantus  daily.  Patient otherwise doing well.  Discharge.  Understands return precautions.  Will need further blood pressure management by primary care team as well.  Have no concern for stroke or head bleed.  She is well-appearing.  This chart was dictated using voice recognition software.  Despite best  efforts to proofread,  errors can occur which can change the documentation meaning.      Final diagnoses:  Hyperglycemia    ED Discharge Orders          Ordered    insulin  glargine (LANTUS ) 100 UNIT/ML injection  Daily        04/12/24 2001               Ruthe Cornet, DO 04/12/24 2110

## 2024-04-12 NOTE — ED Triage Notes (Signed)
 Pt reports elevated blood sugar with insulin /medication compliance. Pt reports appointment with PCP on Thusrday. Pt also reports elevated BP as well but is compliant with medications.

## 2024-04-16 ENCOUNTER — Ambulatory Visit: Admitting: Student

## 2024-04-16 VITALS — BP 169/118 | HR 85 | Temp 98.2°F | Ht 66.0 in | Wt 360.2 lb

## 2024-04-16 DIAGNOSIS — Z8673 Personal history of transient ischemic attack (TIA), and cerebral infarction without residual deficits: Secondary | ICD-10-CM

## 2024-04-16 DIAGNOSIS — M329 Systemic lupus erythematosus, unspecified: Secondary | ICD-10-CM

## 2024-04-16 DIAGNOSIS — E1169 Type 2 diabetes mellitus with other specified complication: Secondary | ICD-10-CM

## 2024-04-16 DIAGNOSIS — Z794 Long term (current) use of insulin: Secondary | ICD-10-CM

## 2024-04-16 DIAGNOSIS — I1A0 Resistant hypertension: Secondary | ICD-10-CM

## 2024-04-16 DIAGNOSIS — G4733 Obstructive sleep apnea (adult) (pediatric): Secondary | ICD-10-CM | POA: Diagnosis not present

## 2024-04-16 DIAGNOSIS — Z7984 Long term (current) use of oral hypoglycemic drugs: Secondary | ICD-10-CM | POA: Diagnosis not present

## 2024-04-16 DIAGNOSIS — I10 Essential (primary) hypertension: Secondary | ICD-10-CM

## 2024-04-16 LAB — POCT GLYCOSYLATED HEMOGLOBIN (HGB A1C): HbA1c, POC (controlled diabetic range): 8 % — AB (ref 0.0–7.0)

## 2024-04-16 LAB — GLUCOSE, CAPILLARY: Glucose-Capillary: 172 mg/dL — ABNORMAL HIGH (ref 70–99)

## 2024-04-16 MED ORDER — INSULIN GLARGINE 100 UNIT/ML ~~LOC~~ SOLN: 12.0000 [IU] | Freq: Every day | SUBCUTANEOUS | 0 refills | Status: DC

## 2024-04-16 MED ORDER — LISINOPRIL 40 MG PO TABS: 40.0000 mg | ORAL_TABLET | Freq: Every day | ORAL | 3 refills | Status: DC

## 2024-04-16 NOTE — Assessment & Plan Note (Deleted)
 Reports hx of TIA in 2012. Was on ASA daily but at some point stopped and did not resume.  - She has no contraindications to starting aspirin .  Encourage patient to start taking aspirin .

## 2024-04-16 NOTE — Progress Notes (Signed)
 CC: Hypertension,/diabetes.  HPI:  [name] is a 43 y.o. female living with a history stated below and presents today for follow-up on hypertension, diabetes..   Recently seen in the ED for hyperglycemia (BG in high 200s, normal pH); treated with IV fluids and insulin .   History of recurrent resistant hypertension.  Seen twice in urgent care/ED for dental pain; extraction deferred due to uncontrolled BP.  Please see problem based assessment and plan for additional details.  Past Medical History:  Diagnosis Date   Anemia    Asthma    Chlamydia 2012   treated   Complication of anesthesia     they have trouble waking me up   Diabetes mellitus    A2DM during pregnancy. now on metformin .    Dyspnea 05/07/2019   Dysuria 04/11/2019   Family history of anesthesia complication    mother & daughter    GERD (gastroesophageal reflux disease)    Gonorrhea 2010   treated   Hypertension    Lupus 10-2013   Morbid obesity (HCC) 07/22/2011   Pregnancy induced hypertension    pre E with pregnancies   Rheumatoid arthritis(714.0)    Shortness of breath    Sleep apnea    does not use cpap   Stroke [facility]) 2012   March 2012>right side deficit (speech, upper/ower weakness)   SVT (supraventricular tachycardia) (HCC)     Current Outpatient Medications on File Prior to Visit  Medication Sig Dispense Refill   ACCU-CHEK AVIVA PLUS test strip USE TO CHECK BLOOD SUGAR UP TO 4 TIMES A DAY. 100 strip 12   Accu-Chek Softclix Lancets lancets Check blood sugar 3 times a day 100 each 11   albuterol  (VENTOLIN  HFA) 108 (90 Base) MCG/ACT inhaler TAKE 2 PUFFS BY MOUTH EVERY 6 HOURS AS NEEDED FOR WHEEZE OR SHORTNESS OF BREATH 18 each 3   amLODipine  (NORVASC ) 10 MG tablet Take 1 tablet (10 mg total) by mouth daily. 90 tablet 1   Blood Glucose Monitoring Suppl (ACCU-CHEK GUIDE) w/Device KIT 1 each by Does not apply route 4 (four) times daily. Check blood sugar 3 times a day 1 kit 1    chlorhexidine  (PERIDEX ) 0.12 % solution Use as directed 15 mLs in the mouth or throat 2 (two) times daily. 120 mL 0   chlorthalidone  (HYGROTON ) 50 MG tablet Take 1 tablet (50 mg total) by mouth daily. 30 tablet 11   Cholecalciferol  25 MCG (1000 UT) capsule Take 1 capsule (1,000 Units total) by mouth daily. 60 capsule 2   Continuous Glucose Receiver (DEXCOM G6 RECEIVER) DEVI Use to check blood sugar at least 6 times a day 1 each 0   Continuous Glucose Sensor (DEXCOM G6 SENSOR) MISC Check blood sugar at least 6 times a day 3 each 11   Continuous Glucose Transmitter (DEXCOM G6 TRANSMITTER) MISC Use to check blood sugar at least 6 times a day 1 each 3   diclofenac  Sodium (VOLTAREN ) 1 % GEL Apply 4 g topically 4 (four) times daily. 100 g 0   gabapentin  (NEURONTIN ) 300 MG capsule Take 1 capsule (300 mg total) by mouth at bedtime. 90 capsule 3   hyoscyamine  (LEVSIN ) 0.125 MG tablet TAKE 1 TABLET BY MOUTH 3 TIMES A DAY AS NEEDED 30 tablet 3   ibuprofen  (ADVIL ) 600 MG tablet Take 1 tablet (600 mg total) by mouth every 8 (eight) hours as needed for mild pain (pain score 1-3) or moderate pain (pain score 4-6). 15 tablet 0   Insulin   Pen Needle (PEN NEEDLES) 32G X 5 MM MISC 1 each by Does not apply route 5 (five) times daily. 100 each 3   magic mouthwash (nystatin , lidocaine , diphenhydrAMINE ) suspension Take 5 mLs by mouth 4 (four) times daily as needed for mouth pain. 180 mL 0   metFORMIN  (GLUCOPHAGE ) 500 MG tablet Take 2 tablets (1,000 mg total) by mouth 2 (two) times daily with a meal. 360 tablet 3   Multiple Vitamin (MULTIVITAMIN WITH MINERALS) TABS tablet Take 1 tablet by mouth daily.     nystatin  (MYCOSTATIN /NYSTOP ) powder APPLY TOPICALLY 3 TIMES A DAY 15 g 0   oxyCODONE  (ROXICODONE ) 5 MG immediate release tablet Take 1 tablet (5 mg total) by mouth every 6 (six) hours as needed for up to 10 doses. 10 tablet 0   pantoprazole  (PROTONIX ) 20 MG tablet TAKE 1 TABLET BY MOUTH EVERY DAY 90 tablet 1   Probiotic  Product (PROBIOTIC PO) Take 1 capsule by mouth daily.     propranolol  (INDERAL ) 20 MG tablet Take 2 tablets (40 mg total) by mouth 2 (two) times daily. 360 tablet 1   rosuvastatin  (CRESTOR ) 20 MG tablet Take 1 tablet (20 mg total) by mouth daily. 90 tablet 3   Vitamin D , Ergocalciferol , (DRISDOL ) 1.25 MG (50000 UNIT) CAPS capsule Take 1 capsule (50,000 Units total) by mouth every 7 (seven) days. 8 capsule 0   [DISCONTINUED] budesonide  (PULMICORT ) 180 MCG/ACT inhaler Inhale 1 puff into the lungs 2 (two) times daily. 1 each 1   No current facility-administered medications on file prior to visit.    Review of Systems: ROS negative except for what is noted on the assessment and plan.  Vitals:   04/16/24 1459  BP: (!) 169/118  Pulse: 85  Temp: 98.2 F (36.8 C)  TempSrc: Oral  SpO2: 100%  Weight: (!) 360 lb 3.2 oz (163.4 kg)  Height: 5' 6 (1.676 m)    Physical Exam: Constitutional: NAD Cardiovascular: RRR, no murmurs. Pulmonary/Chest: Clear bilateral lungs Abdominal: soft, non-tender, non-distended.  Assessment & Plan:   Patient discussed with Dr. Karna  Assessment & Plan Resistant hypertension BP Readings from Last 3 Encounters:  04/16/24 (!) 169/118  04/12/24 (!) 194/112  01/14/24 (!) 198/101  Chronic, on >3 antihypertensives (amlodipine  10 mg, chlorthalidone  25 mg, lisinopril  40 mg). Also on propranolol  40 mg BID for history of SVT. Resistant hypertension unlikely due to nonadherence, as patient is knowledgeable about medications. History of lupus, but no evidence of renal involvement (denies joint pain/swelling, UA negative for hematuria/proteinuria). Renin/aldosterone ratio a year ago was normal; CMP without hypokalemia. Resistant hypertension likely multifactorial, related to untreated OSA and left renal artery stenosis (>60% on vascular US , 2021). Plan: - I have placed a referral for sleep study - Will start spironolactone  12.5mg  - Patient advised to monitor blood  pressures at home, and keep a log. - Follow-up in 2 weeks. - Repeat vascular ultrasound - Referral to vascular surgery placed.  Type 2 diabetes mellitus with other specified complication, without long-term current use of insulin  (HCC) A1c 8.0 today. On CGM, average glucose ~180 mg/dL over past 7 days, without hypoglycemia. Glycemic control improved since starting Lantus  10 units nightly. Currently on metformin  1000 mg BID. Previously trialed GLP-1 but discontinued due to GI intolerance. - Will increase Lantus  to 12 units. - Will check microalbumin/creatinine ratio. - Follow up in 2 weeks as above Obstructive sleep apnea Referral to sleep study as above. Systemic lupus erythematosus, unspecified SLE type, unspecified organ involvement status (HCC) Does not  follow with rheumatology, not in acute flare.  -Referral to dermatology placed.  Orders Placed This Encounter  Procedures   Glucose, capillary   Ambulatory referral to Sleep Studies   Ambulatory referral to Rheumatology   Ambulatory referral to Vascular Surgery   POC Hbg A1C   Health maintenance screening Pap smear at the next office visit. Encouraged patient to get annual flu vaccine.  Missy Sandhoff, MD [facility] Internal Medicine, PGY-2  Date 04/19/2024 Time 6:52 PM

## 2024-04-16 NOTE — Assessment & Plan Note (Addendum)
 BP Readings from Last 3 Encounters:  04/16/24 (!) 169/118  04/12/24 (!) 194/112  01/14/24 (!) 198/101  Chronic, on >3 antihypertensives (amlodipine  10 mg, chlorthalidone  25 mg, lisinopril  40 mg). Also on propranolol  40 mg BID for history of SVT. Resistant hypertension unlikely due to nonadherence, as patient is knowledgeable about medications. History of lupus, but no evidence of renal involvement (denies joint pain/swelling, UA negative for hematuria/proteinuria). Renin/aldosterone ratio a year ago was normal; CMP without hypokalemia. Resistant hypertension likely multifactorial, related to untreated OSA and left renal artery stenosis (>60% on vascular US , 2021). Plan: - I have placed a referral for sleep study - Will start spironolactone  12.5mg  - Patient advised to monitor blood pressures at home, and keep a log. - Follow-up in 2 weeks. - Repeat vascular ultrasound - Referral to vascular surgery placed.

## 2024-04-16 NOTE — Assessment & Plan Note (Addendum)
 A1c 8.0 today. On CGM, average glucose ~180 mg/dL over past 7 days, without hypoglycemia. Glycemic control improved since starting Lantus  10 units nightly. Currently on metformin  1000 mg BID. Previously trialed GLP-1 but discontinued due to GI intolerance. - Will increase Lantus  to 12 units. - Will check microalbumin/creatinine ratio. - Follow up in 2 weeks as above

## 2024-04-16 NOTE — Patient Instructions (Addendum)
 It was a pleasure taking care of you today!    1.  Your blood pressure is still elevated despite the amlodipine , lisinopril  and hydrochlorothiazide .  I have started you on a new medicine called spironolactone  12.5 mg to help with blood pressure control.  2.  Please check your blood pressures at home daily and keep a log.  3.  Increase your insulin  to 12 units daily.  4.  Continue healthy diet and exercise.  5.  I have placed a referral to complete sleep study, and to also see a rheumatologist.  I have ordered the following labs for you:   Lab Orders         Microalbumin / Creatinine Urine Ratio         Glucose, capillary         POC Hbg A1C       Follow up: 4 weeks   Should you have any questions or concerns please call the internal medicine clinic at [phone].     Missy Sandhoff, MD  [facility]

## 2024-04-19 MED ORDER — LISINOPRIL 40 MG PO TABS: 40.0000 mg | ORAL_TABLET | Freq: Every day | ORAL | 3 refills | Status: AC

## 2024-04-19 MED ORDER — SPIRONOLACTONE 25 MG PO TABS: 12.5000 mg | ORAL_TABLET | Freq: Every day | ORAL | 3 refills | Status: DC

## 2024-04-19 NOTE — Assessment & Plan Note (Signed)
 Referral to sleep study as above.

## 2024-04-19 NOTE — Assessment & Plan Note (Addendum)
 Does not follow with rheumatology, not in acute flare.  -Referral to dermatology placed.

## 2024-04-20 NOTE — Progress Notes (Signed)
 Internal Medicine Clinic Attending  Case discussed with the resident at the time of the visit.  We reviewed the resident's history and exam and pertinent patient test results.  I agree with the assessment, diagnosis, and plan of care documented in the resident's note.

## 2024-04-22 ENCOUNTER — Other Ambulatory Visit: Payer: Self-pay | Admitting: Student

## 2024-04-22 ENCOUNTER — Telehealth: Payer: Self-pay | Admitting: *Deleted

## 2024-04-22 MED ORDER — HYOSCYAMINE SULFATE 0.125 MG PO TABS: 0.1250 mg | ORAL_TABLET | Freq: Three times a day (TID) | ORAL | 3 refills | Status: DC | PRN

## 2024-04-22 MED ORDER — ACCU-CHEK GUIDE TEST VI STRP: ORAL_STRIP | 12 refills | Status: AC

## 2024-04-22 MED ORDER — INSULIN GLARGINE 100 UNIT/ML SOLOSTAR PEN: 12.0000 [IU] | PEN_INJECTOR | Freq: Every day | SUBCUTANEOUS | 11 refills | Status: AC

## 2024-04-22 NOTE — Telephone Encounter (Signed)
 Copied from CRM [id number]. Topic: Clinical - Prescription Issue >> Apr 22, 2024  9:01 AM Marda G wrote: Reason for CRM: received glass bottle insulin , which should have been lantus  pen. Did not receive insulin  aspart (novoLOG ) 100 UNIT/ML injection Did not receive pen needles  Meter strips are wrong need the ones for the accu check quide

## 2024-04-22 NOTE — Telephone Encounter (Signed)
 I called pt who stated the doctor prescribed Lantus  vials (on 8/21) instead of the Lantus  pen which is covered by her insurance; needs new rx. At her OV on 8/21, she told the doctor she needs to be back on  Novolog ; she had been to the ED for high BS's; stated she explained this to the doctor; this was not done. Also stated the doctor sent in the wrong rx for test strips; she needs rx for the Accu chek Guide not accu chek aviva. Also she needs a refill on Hyoscyamine  - stated she mentioned this to doctor at her last 2 appts. - [facility]. Thanks

## 2024-04-22 NOTE — Telephone Encounter (Signed)
 Copied from CRM [id number]. Topic: Clinical - Medication Refill >> Apr 22, 2024  9:11 AM Carrielelia G wrote: Medication: hyoscyamine  (LEVSIN ) 0.125 MG tablet  Has the patient contacted their pharmacy? No (Agent: If no, request that the patient contact the pharmacy for the refill. If patient does not wish to contact the pharmacy document the reason why and proceed with request.) (Agent: If yes, when and what did the pharmacy advise?)  This is the patient's preferred pharmacy:  [id number] [place], [place] - [address] [place] [address] Phone: [phone] Fax: [phone]  Is this the correct pharmacy for this prescription? Yes If no, delete pharmacy and type the correct one.   Is the patient out of the medication? Yes  Has the patient been seen for an appointment in the last year OR does the patient have an upcoming appointment? Yes  Can we respond through MyChart? Yes  Agent: Please be advised that Rx refills may take up to 3 business days. We ask that you follow-up with your pharmacy.

## 2024-04-22 NOTE — Telephone Encounter (Signed)
 What about re-starting Novolog  as requested by pt?

## 2024-04-23 ENCOUNTER — Other Ambulatory Visit: Payer: Self-pay | Admitting: Dietician

## 2024-04-23 DIAGNOSIS — E1169 Type 2 diabetes mellitus with other specified complication: Secondary | ICD-10-CM

## 2024-04-23 MED ORDER — PEN NEEDLES 32G X 5 MM MISC: 3 refills | Status: AC

## 2024-04-23 NOTE — Telephone Encounter (Signed)
 Pt called and informed of Dr Anthony response about Novolog . Pt has questions about what to do when her BS's are high. I asked Arland Camie fleming cary, to speak to pt - call transferred.

## 2024-04-23 NOTE — Telephone Encounter (Addendum)
 Maria Carpenter asks why she was not prescribed Novolog  for her blood sugar spikes that are occurring mid sleep and after meals >240 mg/dL. She has not started back on her Lantus  yet and intends to pick it up today. We discussed the dawn phenomenon and blood glucose targets for both pore meal and post meal and reviewed her CGM remote upload. I explained that her blood sugar may come into target without the Novolog  and that if needed the addition of Novolog  can be discussed at her follow up appointment.  She requests pen needles to take her lantus .

## 2024-04-29 ENCOUNTER — Other Ambulatory Visit: Payer: Self-pay | Admitting: Medical Genetics

## 2024-04-29 DIAGNOSIS — E119 Type 2 diabetes mellitus without complications: Secondary | ICD-10-CM | POA: Diagnosis not present

## 2024-04-29 DIAGNOSIS — H02831 Dermatochalasis of right upper eyelid: Secondary | ICD-10-CM | POA: Diagnosis not present

## 2024-04-29 DIAGNOSIS — H35033 Hypertensive retinopathy, bilateral: Secondary | ICD-10-CM | POA: Diagnosis not present

## 2024-04-29 DIAGNOSIS — H02834 Dermatochalasis of left upper eyelid: Secondary | ICD-10-CM | POA: Diagnosis not present

## 2024-04-29 DIAGNOSIS — H538 Other visual disturbances: Secondary | ICD-10-CM | POA: Diagnosis not present

## 2024-04-29 LAB — HM DIABETES EYE EXAM

## 2024-05-04 DIAGNOSIS — H5213 Myopia, bilateral: Secondary | ICD-10-CM | POA: Diagnosis not present

## 2024-05-14 ENCOUNTER — Ambulatory Visit

## 2024-05-14 ENCOUNTER — Other Ambulatory Visit: Payer: Self-pay

## 2024-05-14 VITALS — BP 198/113 | HR 80 | Temp 98.5°F | Ht 67.0 in | Wt 360.2 lb

## 2024-05-14 DIAGNOSIS — Z1231 Encounter for screening mammogram for malignant neoplasm of breast: Secondary | ICD-10-CM

## 2024-05-14 DIAGNOSIS — Z23 Encounter for immunization: Secondary | ICD-10-CM

## 2024-05-14 DIAGNOSIS — I1A0 Resistant hypertension: Secondary | ICD-10-CM | POA: Diagnosis present

## 2024-05-14 DIAGNOSIS — I10 Essential (primary) hypertension: Secondary | ICD-10-CM

## 2024-05-14 DIAGNOSIS — J4540 Moderate persistent asthma, uncomplicated: Secondary | ICD-10-CM

## 2024-05-14 DIAGNOSIS — E1169 Type 2 diabetes mellitus with other specified complication: Secondary | ICD-10-CM

## 2024-05-14 DIAGNOSIS — N939 Abnormal uterine and vaginal bleeding, unspecified: Secondary | ICD-10-CM

## 2024-05-14 DIAGNOSIS — Z7984 Long term (current) use of oral hypoglycemic drugs: Secondary | ICD-10-CM

## 2024-05-14 DIAGNOSIS — E559 Vitamin D deficiency, unspecified: Secondary | ICD-10-CM

## 2024-05-14 DIAGNOSIS — Z794 Long term (current) use of insulin: Secondary | ICD-10-CM | POA: Diagnosis not present

## 2024-05-14 MED ORDER — PROPRANOLOL HCL 20 MG PO TABS
40.0000 mg | ORAL_TABLET | Freq: Two times a day (BID) | ORAL | 1 refills | Status: AC
Start: 2024-05-14 — End: ?

## 2024-05-14 MED ORDER — SPIRONOLACTONE 25 MG PO TABS: 25.0000 mg | ORAL_TABLET | Freq: Every day | ORAL | 3 refills | Status: DC

## 2024-05-14 MED ORDER — ALBUTEROL SULFATE HFA 108 (90 BASE) MCG/ACT IN AERS: 1.0000 | INHALATION_SPRAY | RESPIRATORY_TRACT | 3 refills | Status: AC | PRN

## 2024-05-14 MED ORDER — CHOLECALCIFEROL 25 MCG (1000 UT) PO CAPS: 1000.0000 [IU] | ORAL_CAPSULE | Freq: Every day | ORAL | 2 refills | Status: AC

## 2024-05-14 NOTE — Patient Instructions (Addendum)
 It was wonderful seeing you today!   Things we talked about.SABRASABRA  1) for diabetes: continue checking your sugars, taking glargine 12 units at night, and metformin  1000 mg twice a day with meals.   2) for your blood pressure: INCREASE spironolactone  to 50 mg daily, continue the amlodipine , chlorthalidone , and lisinopril    3) for your lupus, please call the number below to schedule your appointment:  [facility] Rheumatologist in [place], [place]  Located in: [place] office suites Address: [address], [place], [place] [address] Phone: [phone]  4) someone will call you about scheduling mammogram and GYN appointment  If you have any questions please feel free to the call the clinic at anytime at [phone].  Have a blessed day,  Dr. Charmayne

## 2024-05-15 NOTE — Assessment & Plan Note (Signed)
 Patient has a history of abnormal uterine bleeding, pelvic pain that is associated with her periods but also occurs when she is not on her period.  She is due for a Pap smear. She does not have an OB/GYN currently and would like 1.  Plan to place referral today. Orders:   Ambulatory referral to Gynecology

## 2024-05-15 NOTE — Assessment & Plan Note (Signed)
 Current regimen includes spironolactone  12.5 mg, amlodipine  10 mg, chlorthalidone  25 mg and lisinopril  40 mg.  She also takes propranolol  40 mg twice daily for SVTs.  Patient was able to verbalize all of the medications that she is taking.  Blood pressure today 198/113. Today she says that she has had a headache for about 4 days, she has a history of migraines and states this headache feels different than her migraines.  She describes the headache as on and off and better when she stays hydrated.  Denies eye pain associated with the headache and says the pain is located behind her forehead.  The pain is usually worse in the morning and gets better throughout the day.  She has a history of lupus that has not been followed or managed in several years.  Renin aldosterone ratio a year ago was normal and recent metabolic panel normal potassium.  Likely multifactorial, related to untreated OSA, morbid obesity, lupus, and left renal artery stenosis seen on a vascular ultrasound in 2021.  Referral for sleep study, vascular surgery and rheumatology was placed at the last office visit on 8/21.  Patient missed the calls from the sleep study and from rheumatology, gave her the number for rheumatology and she has the number to get the sleep study done, encouraged her to call these numbers and schedule these appointments as soon as possible.  Reached out to our referral coordinator to follow-up with the vascular surgery referral.  Plan to increase spironolactone  to 25 mg daily and continue amlodipine  10 mg, chlorthalidone  25 mg and lisinopril  40 mg daily.  As well as propranolol  40 mg twice daily.  Plan to have patient follow-up with rheumatology and complete sleep study and be evaluated by vascular surgery.  Reinforced the importance of bringing down her blood pressure with weight loss, adherence to medication, and importance of seeing specialist. Orders:   spironolactone  (ALDACTONE ) 25 MG tablet; Take 1 tablet (25 mg total)  by mouth daily.   propranolol  (INDERAL ) 20 MG tablet; Take 2 tablets (40 mg total) by mouth 2 (two) times daily.

## 2024-05-15 NOTE — Assessment & Plan Note (Signed)
 A1c on 8/21 was 8.  Current regimen includes glargine 12 units at night and metformin  1000 mg twice daily.  She does have a CGM, 31% of the time and high or very high, 69% in range, 0% low or very low.  Plan to continue current regimen, she is due for a urine MCR and will be due for an A1c at the end of November.

## 2024-05-15 NOTE — Assessment & Plan Note (Signed)
 Vitamin D  25-hydroxy low at 12 on 05/2022.  Patient needs a refill.  Plan to continue vitamin D  1000 and recheck at next visit. Orders:   Cholecalciferol  25 MCG (1000 UT) capsule; Take 1 capsule (1,000 Units total) by mouth daily.

## 2024-05-15 NOTE — Assessment & Plan Note (Signed)
 Patient is breathing comfortably today and does not have a cough.  She does need a refill. Orders:   albuterol  (VENTOLIN  HFA) 108 (90 Base) MCG/ACT inhaler; Inhale 1 puff into the lungs every 4 (four) hours as needed for wheezing or shortness of breath.

## 2024-05-15 NOTE — Progress Notes (Signed)
 Established Patient Office Visit  Subjective   Patient ID: Maria Carpenter, female    DOB: [birth year]  Age: 43 y.o. MRN: [id number]  Patient here for 1 month follow-up, she is accompanied by 2 of her toddler grandchildren.  There are a few different things that she has concerns about, although our focus was on her blood pressure and confirming important referrals given her diagnosis of lupus that has not been managed in years.         Objective:     BP (!) 198/113 (BP Location: Right Arm, Patient Position: Sitting, Cuff Size: Large)   Pulse 80   Temp 98.5 F (36.9 C) (Oral)   Ht 5' 7 (1.702 m)   Wt (!) 360 lb 3.2 oz (163.4 kg)   SpO2 96%   BMI 56.42 kg/m  BP Readings from Last 3 Encounters:  05/14/24 (!) 198/113  04/16/24 (!) 169/118  04/12/24 (!) 194/112   Wt Readings from Last 3 Encounters:  05/14/24 (!) 360 lb 3.2 oz (163.4 kg)  04/16/24 (!) 360 lb 3.2 oz (163.4 kg)  04/12/24 (!) 360 lb (163.3 kg)      Physical Exam Vitals reviewed.  Constitutional:      General: She is not in acute distress.    Appearance: She is obese. She is not ill-appearing.  HENT:     Nose: Nose normal.  Eyes:     Conjunctiva/sclera: Conjunctivae normal.  Pulmonary:     Effort: Pulmonary effort is normal.  Skin:    General: Skin is warm.     Comments: Frontal and midline hair loss Pitting of the fingernails  Neurological:     General: No focal deficit present.     Mental Status: She is alert and oriented to person, place, and time.  Psychiatric:        Mood and Affect: Mood normal.      Results for orders placed or performed in visit on 05/14/24  HM DIABETES EYE EXAM  Result Value Ref Range   HM Diabetic Eye Exam No Retinopathy No Retinopathy    Last CBC Lab Results  Component Value Date   WBC 8.3 04/12/2024   HGB 11.6 (L) 04/12/2024   HCT 34.0 (L) 04/12/2024   MCV 77.0 (L) 04/12/2024   MCH 24.2 (L) 04/12/2024   RDW 17.3 (H) 04/12/2024   PLT 264 04/12/2024    Last metabolic panel Lab Results  Component Value Date   GLUCOSE 185 (H) 04/12/2024   NA 135 04/12/2024   K 3.9 04/12/2024   CL 99 04/12/2024   CO2 25 04/12/2024   BUN 13 04/12/2024   CREATININE 0.95 04/12/2024   GFRNONAA >60 04/12/2024   CALCIUM  9.2 04/12/2024   PROT 7.3 04/12/2024   ALBUMIN 4.0 04/12/2024   LABGLOB 3.1 05/30/2022   AGRATIO 1.3 05/30/2022   BILITOT 0.2 04/12/2024   ALKPHOS 96 04/12/2024   AST 15 04/12/2024   ALT 9 04/12/2024   ANIONGAP 11 04/12/2024   Last lipids Lab Results  Component Value Date   CHOL 155 06/29/2020   HDL 41 06/29/2020   LDLCALC 100 (H) 06/29/2020   TRIG 71 06/29/2020   CHOLHDL 3.8 06/29/2020   Last hemoglobin A1c Lab Results  Component Value Date   HGBA1C 8.0 (A) 04/16/2024      The ASCVD Risk score (Arnett DK, et al., 2019) failed to calculate for the following reasons:   Risk score cannot be calculated because patient has a medical history suggesting prior/existing ASCVD  Assessment & Plan:   Assessment & Plan Resistant hypertension Current regimen includes spironolactone  12.5 mg, amlodipine  10 mg, chlorthalidone  25 mg and lisinopril  40 mg.  She also takes propranolol  40 mg twice daily for SVTs.  Patient was able to verbalize all of the medications that she is taking.  Blood pressure today 198/113. Today she says that she has had a headache for about 4 days, she has a history of migraines and states this headache feels different than her migraines.  She describes the headache as on and off and better when she stays hydrated.  Denies eye pain associated with the headache and says the pain is located behind her forehead.  The pain is usually worse in the morning and gets better throughout the day.  She has a history of lupus that has not been followed or managed in several years.  Renin aldosterone ratio a year ago was normal and recent metabolic panel normal potassium.  Likely multifactorial, related to untreated OSA, morbid  obesity, lupus, and left renal artery stenosis seen on a vascular ultrasound in 2021.  Referral for sleep study, vascular surgery and rheumatology was placed at the last office visit on 8/21.  Patient missed the calls from the sleep study and from rheumatology, gave her the number for rheumatology and she has the number to get the sleep study done, encouraged her to call these numbers and schedule these appointments as soon as possible.  Reached out to our referral coordinator to follow-up with the vascular surgery referral.  Plan to increase spironolactone  to 25 mg daily and continue amlodipine  10 mg, chlorthalidone  25 mg and lisinopril  40 mg daily.  As well as propranolol  40 mg twice daily.  Plan to have patient follow-up with rheumatology and complete sleep study and be evaluated by vascular surgery.  Reinforced the importance of bringing down her blood pressure with weight loss, adherence to medication, and importance of seeing specialist. Orders:   spironolactone  (ALDACTONE ) 25 MG tablet; Take 1 tablet (25 mg total) by mouth daily.   propranolol  (INDERAL ) 20 MG tablet; Take 2 tablets (40 mg total) by mouth 2 (two) times daily.  Type 2 diabetes mellitus with other specified complication, without long-term current use of insulin  (HCC) A1c on 8/21 was 8.  Current regimen includes glargine 12 units at night and metformin  1000 mg twice daily.  She does have a CGM, 31% of the time and high or very high, 69% in range, 0% low or very low.  Plan to continue current regimen, she is due for a urine MCR and will be due for an A1c at the end of November.    Vitamin D  deficiency Vitamin D  25-hydroxy low at 12 on 05/2022.  Patient needs a refill.  Plan to continue vitamin D  1000 and recheck at next visit. Orders:   Cholecalciferol  25 MCG (1000 UT) capsule; Take 1 capsule (1,000 Units total) by mouth daily.  Abnormal uterine bleeding (AUB) Patient has a history of abnormal uterine bleeding, pelvic pain that is  associated with her periods but also occurs when she is not on her period.  She is due for a Pap smear. She does not have an OB/GYN currently and would like 1.  Plan to place referral today. Orders:   Ambulatory referral to Gynecology  Moderate persistent asthma, unspecified whether complicated Patient is breathing comfortably today and does not have a cough.  She does need a refill. Orders:   albuterol  (VENTOLIN  HFA) 108 (90 Base) MCG/ACT inhaler; Inhale  1 puff into the lungs every 4 (four) hours as needed for wheezing or shortness of breath.  Encounter for immunization Given today Orders:   Flu vaccine trivalent PF, 6mos and older(Flulaval,Afluria,Fluarix,Fluzone)  Encounter for screening mammogram for malignant neoplasm of breast  Last mammogram in 03/2019 recommended screening mammogram at age 5, she would like to have this referral.  Orders:   MM 3D SCREENING MAMMOGRAM BILATERAL BREAST; Future   Return in about 4 weeks (around 06/11/2024) for fu BP.    Viktoria King, DO

## 2024-05-16 NOTE — Progress Notes (Signed)
 Internal Medicine Clinic Attending  I was physically present during the key portions of the resident provided service and participated in the medical decision making of patient's management care. I reviewed pertinent patient test results.  The assessment, diagnosis, and plan were formulated together and I agree with the documentation in the resident's note.  Jeanelle Layman CROME, MD

## 2024-05-18 ENCOUNTER — Ambulatory Visit: Payer: Self-pay | Admitting: Student

## 2024-05-25 ENCOUNTER — Other Ambulatory Visit: Payer: Self-pay

## 2024-05-25 DIAGNOSIS — I701 Atherosclerosis of renal artery: Secondary | ICD-10-CM

## 2024-06-10 ENCOUNTER — Ambulatory Visit: Admitting: Student

## 2024-06-11 ENCOUNTER — Ambulatory Visit: Admitting: Student

## 2024-06-18 ENCOUNTER — Telehealth: Payer: Self-pay

## 2024-06-18 NOTE — Telephone Encounter (Signed)
 Prior Authorization for patient (Dexcom G6 Sensor) came through on cover my meds was submitted with last office notes and labs awaiting approval or denial.  KEY:[id number]

## 2024-06-18 NOTE — Telephone Encounter (Signed)
 Maria Carpenter (Key: [id number]) PA Case ID #: [id number] Rx #: [id number] Need Help? Call us  at [phone] Outcome Approved today by [facility] 2017 NCPDP Request Reference Number: [id number]. DEXCOM G6 MIS SENSOR is approved through 06/18/2025. For further questions, call [employer] at [phone]. Effective Date: 06/18/2024 Authorization Expiration Date: 06/18/2025 Drug Dexcom G6 Sensor ePA cloud logo Form [employer] Electronic Prior Authorization Form [phone] NCPDP) Original Claim Info 75 PA Req, Dr submit ePA at [email or web address] MD Call [phone], Possible 3 [id number] 03 Lvl of Service PA# 72 PA TYPE8Drug Requires Prior Authorization  Lvm for patient regarding the approval.

## 2024-06-24 ENCOUNTER — Encounter: Payer: Self-pay | Admitting: Vascular Surgery

## 2024-06-24 ENCOUNTER — Ambulatory Visit (HOSPITAL_COMMUNITY)
Admission: RE | Admit: 2024-06-24 | Discharge: 2024-06-24 | Disposition: A | Discharge status: Home / Self Care | Source: Ambulatory Visit | Attending: Vascular Surgery | Admitting: Vascular Surgery

## 2024-06-24 ENCOUNTER — Other Ambulatory Visit: Payer: Self-pay

## 2024-06-24 ENCOUNTER — Ambulatory Visit (INDEPENDENT_AMBULATORY_CARE_PROVIDER_SITE_OTHER): Admitting: Vascular Surgery

## 2024-06-24 VITALS — BP 217/137 | HR 73 | Temp 98.0°F | Ht 67.0 in | Wt 362.0 lb

## 2024-06-24 DIAGNOSIS — I701 Atherosclerosis of renal artery: Secondary | ICD-10-CM

## 2024-06-24 NOTE — Progress Notes (Signed)
 Patient ID: Maria Carpenter, female   DOB: [birth year], 43 y.o.   MRN: [id number]  Reason for Consult: New Patient (Initial Visit)   Referred by Tobie Gaines, DO  Subjective:     HPI:  Maria Carpenter is a 43 y.o. female history of uncontrolled hypertension for over 20 years.  She states that she currently takes 5 medications with blood pressures that remain routinely in the 200s.  She has a remote history of left renal artery stenosis by CT scan I do not have record of today.  She has never had any other vascular issues.  She is a current everyday smoker currently down to 5 cigarettes.  She has a family history of intracerebral aneurysms no abdominal aortic aneurysms.  She did have a stroke in her 30s was initially on Lovenox  no longer on anticoagulant or antiplatelet medications.  She is on a statin.  Past Medical History:  Diagnosis Date   Anemia    Asthma    Chlamydia 2012   treated   Complication of anesthesia     they have trouble waking me up   Diabetes mellitus    A2DM during pregnancy. now on metformin .    Dyspnea 05/07/2019   Dysuria 04/11/2019   Family history of anesthesia complication    mother & daughter    GERD (gastroesophageal reflux disease)    Gonorrhea 2010   treated   Hypertension    Lupus 10-2013   Morbid obesity (HCC) 07/22/2011   Pregnancy induced hypertension    pre E with pregnancies   Rheumatoid arthritis(714.0)    Shortness of breath    Sleep apnea    does not use cpap   Stroke [facility]) 2012   March 2012>right side deficit (speech, upper/ower weakness)   SVT (supraventricular tachycardia)    Family History  Problem Relation Age of Onset   Anesthesia problems Mother        difficult to arouse, SOB   Diabetes Mother    Hypertension Mother    Stroke Mother    Diabetes Father    Hypertension Father    Heart disease Father    Cancer Maternal Aunt    Cancer Maternal Grandmother    Breast cancer Maternal Grandmother    Breast cancer Cousin     Past Surgical History:  Procedure Laterality Date   CHOLECYSTECTOMY  07/22/2011   Procedure: LAPAROSCOPIC CHOLECYSTECTOMY WITH INTRAOPERATIVE CHOLANGIOGRAM;  Surgeon: Alm VEAR Angle, MD;  Location: [facility] ORS;  Service: General;  Laterality: N/A;   ERCP  07/24/2011   Procedure: ENDOSCOPIC RETROGRADE CHOLANGIOPANCREATOGRAPHY (ERCP);  Surgeon: Belvie JONETTA Just;  Location: [facility] ENDOSCOPY;  Service: Endoscopy;  Laterality: N/A;   FINGER SURGERY  1993   left ring finger to correct bone problem   TUBAL LIGATION  2009    Short Social History:  Social History   Tobacco Use   Smoking status: Every Day    Current packs/day: 0.25    Average packs/day: 0.3 packs/day for 4.0 years (1.0 ttl pk-yrs)    Types: Cigarettes   Smokeless tobacco: Never   Tobacco comments:    15 cig/day  Substance Use Topics   Alcohol use: Yes    Comment: socially    Allergies  Allergen Reactions   Amoxicillin Anaphylaxis and Rash    Has patient had a PCN reaction causing immediate rash, facial/tongue/throat swelling, SOB or lightheadedness with hypotension: Yes Has patient had a PCN reaction causing severe rash involving mucus membranes or skin necrosis: Yes Has  patient had a PCN reaction that required hospitalization Yes Has patient had a PCN reaction occurring within the last 10 years: No If all of the above answers are NO, then may proceed with Cephalosporin use.    Cashew Nut (Anacardium Occidentale) Skin Test Other (See Comments)    Lips burning, tingling, and swelling   Dilaudid  [Hydromorphone  Hcl] Itching   Egg Protein-Containing Drug Products Nausea And Vomiting and Rash   Pineapple Rash    Current Outpatient Medications  Medication Sig Dispense Refill   Accu-Chek Softclix Lancets lancets Check blood sugar 3 times a day 100 each 11   albuterol  (VENTOLIN  HFA) 108 (90 Base) MCG/ACT inhaler Inhale 1 puff into the lungs every 4 (four) hours as needed for wheezing or shortness of breath. 18 each 3    amLODipine  (NORVASC ) 10 MG tablet Take 1 tablet (10 mg total) by mouth daily. 90 tablet 1   Blood Glucose Monitoring Suppl (ACCU-CHEK GUIDE) w/Device KIT 1 each by Does not apply route 4 (four) times daily. Check blood sugar 3 times a day 1 kit 1   chlorhexidine  (PERIDEX ) 0.12 % solution Use as directed 15 mLs in the mouth or throat 2 (two) times daily. 120 mL 0   chlorthalidone  (HYGROTON ) 50 MG tablet Take 1 tablet (50 mg total) by mouth daily. 30 tablet 11   Cholecalciferol  25 MCG (1000 UT) capsule Take 1 capsule (1,000 Units total) by mouth daily. 60 capsule 2   Continuous Glucose Receiver (DEXCOM G6 RECEIVER) DEVI Use to check blood sugar at least 6 times a day 1 each 0   Continuous Glucose Sensor (DEXCOM G6 SENSOR) MISC Check blood sugar at least 6 times a day 3 each 11   Continuous Glucose Transmitter (DEXCOM G6 TRANSMITTER) MISC Use to check blood sugar at least 6 times a day 1 each 3   diclofenac  Sodium (VOLTAREN ) 1 % GEL Apply 4 g topically 4 (four) times daily. 100 g 0   gabapentin  (NEURONTIN ) 300 MG capsule Take 1 capsule (300 mg total) by mouth at bedtime. 90 capsule 3   glucose blood (ACCU-CHEK GUIDE TEST) test strip Use as instructed 100 each 12   hyoscyamine  (LEVSIN ) 0.125 MG tablet Take 1 tablet (0.125 mg total) by mouth 3 (three) times daily as needed. 90 tablet 3   ibuprofen  (ADVIL ) 600 MG tablet Take 1 tablet (600 mg total) by mouth every 8 (eight) hours as needed for mild pain (pain score 1-3) or moderate pain (pain score 4-6). 15 tablet 0   insulin  glargine (LANTUS ) 100 UNIT/ML Solostar Pen Inject 12 Units into the skin daily. 15 mL 11   Insulin  Pen Needle (PEN NEEDLES) 32G X 5 MM MISC Use to inject insulin  once daily 100 each 3   lisinopril  (ZESTRIL ) 40 MG tablet Take 1 tablet (40 mg total) by mouth daily. 90 tablet 3   magic mouthwash (nystatin , lidocaine , diphenhydrAMINE ) suspension Take 5 mLs by mouth 4 (four) times daily as needed for mouth pain. 180 mL 0   metFORMIN   (GLUCOPHAGE ) 500 MG tablet Take 2 tablets (1,000 mg total) by mouth 2 (two) times daily with a meal. 360 tablet 3   Multiple Vitamin (MULTIVITAMIN WITH MINERALS) TABS tablet Take 1 tablet by mouth daily.     nystatin  (MYCOSTATIN /NYSTOP ) powder APPLY TOPICALLY 3 TIMES A DAY 15 g 0   oxyCODONE  (ROXICODONE ) 5 MG immediate release tablet Take 1 tablet (5 mg total) by mouth every 6 (six) hours as needed for up to 10 doses. 10  tablet 0   pantoprazole  (PROTONIX ) 20 MG tablet TAKE 1 TABLET BY MOUTH EVERY DAY 90 tablet 1   Probiotic Product (PROBIOTIC PO) Take 1 capsule by mouth daily.     propranolol  (INDERAL ) 20 MG tablet Take 2 tablets (40 mg total) by mouth 2 (two) times daily. 360 tablet 1   rosuvastatin  (CRESTOR ) 20 MG tablet Take 1 tablet (20 mg total) by mouth daily. 90 tablet 3   spironolactone  (ALDACTONE ) 25 MG tablet Take 1 tablet (25 mg total) by mouth daily. 30 tablet 3   Vitamin D , Ergocalciferol , (DRISDOL ) 1.25 MG (50000 UNIT) CAPS capsule Take 1 capsule (50,000 Units total) by mouth every 7 (seven) days. 8 capsule 0   No current facility-administered medications for this visit.    Review of Systems  Constitutional:  Constitutional negative. HENT: HENT negative.  Eyes: Eyes negative.  Respiratory: Respiratory negative.  Cardiovascular: Cardiovascular negative.  GI: Gastrointestinal negative.  Musculoskeletal: Musculoskeletal negative.  Skin: Skin negative.  Neurological: Neurological negative. Hematologic: Hematologic/lymphatic negative.  Psychiatric: Psychiatric negative.        Objective:  Objective   Vitals:   06/24/24 1004  BP: (!) 217/137  Pulse: 73  Temp: 98 F (36.7 C)  Weight: (!) 362 lb (164.2 kg)  Height: 5' 7 (1.702 m)   Body mass index is 56.7 kg/m.  Physical Exam Constitutional:      Appearance: She is obese.  HENT:     Head: Normocephalic.     Nose: Nose normal.  Cardiovascular:     Rate and Rhythm: Normal rate.  Pulmonary:     Effort:  Pulmonary effort is normal.  Musculoskeletal:     Cervical back: Normal range of motion.     Right lower leg: Edema present.     Left lower leg: Edema present.  Skin:    Capillary Refill: Capillary refill takes less than 2 seconds.  Neurological:     General: No focal deficit present.     Mental Status: She is alert.     Data: Duplex Findings:  +--------------------+--------+--------+------+--------+  Mesenteric         PSV cm/sEDV cm/sPlaqueComments  +--------------------+--------+--------+------+--------+  Aorta Prox            118                           +--------------------+--------+--------+------+--------+  Aorta Mid             121                           +--------------------+--------+--------+------+--------+  Celiac Artery Origin                        NWV     +--------------------+--------+--------+------+--------+  SMA Origin                                  NWV     +--------------------+--------+--------+------+--------+     +------------------+--------+--------+-------+  Right Renal ArteryPSV cm/sEDV cm/sComment  +------------------+--------+--------+-------+  Origin              77      20            +------------------+--------+--------+-------+  Proximal            66      24            +------------------+--------+--------+-------+  Mid                 70      22            +------------------+--------+--------+-------+  Distal              74      13            +------------------+--------+--------+-------+   +-----------------+--------+--------+-------+  Left Renal ArteryPSV cm/sEDV cm/sComment  +-----------------+--------+--------+-------+  Origin             59      18            +-----------------+--------+--------+-------+  Proximal           63      18            +-----------------+--------+--------+-------+  Mid                61      13             +-----------------+--------+--------+-------+  Distal            124      45            +-----------------+--------+--------+-------+   +------------+--------+--------+----+-----------+--------+--------+----+  Right KidneyPSV cm/sEDV cm/sRI  Left KidneyPSV cm/sEDV cm/sRI    +------------+--------+--------+----+-----------+--------+--------+----+  Upper Pole  29      9       0.68Upper Pole 30      8       0.75  +------------+--------+--------+----+-----------+--------+--------+----+  Mid        38      12      0.        39      10      0.75  +------------+--------+--------+----+-----------+--------+--------+----+  Lower Pole  20      6       0.71Lower Pole 28      9       0.69  +------------+--------+--------+----+-----------+--------+--------+----+  Hilar      44      17      0.61Hilar      54      19      0.65  +------------+--------+--------+----+-----------+--------+--------+----+   +------------------+----+------------------+-----+  Right Kidney          Left Kidney              +------------------+----+------------------+-----+  RAR                  RAR                      +------------------+----+------------------+-----+  RAR (manual)      0.6 RAR (manual)      1.0    +------------------+----+------------------+-----+  Cortex               Cortex                   +------------------+----+------------------+-----+  Cortex thickness      Corex thickness          +------------------+----+------------------+-----+  Kidney length (cm)9.80Kidney length (cm)11.90  +------------------+----+------------------+-----+     Summary:  Renal: Technically difficult exam due to patient body habitus and patient  breathing.    Right: Normal size right kidney. Abnormal right Resistive Index. No         evidence of right renal artery stenosis. RRV flow present.  Left:  Normal size of left kidney.  Abnormal  left Resisitve Index. No         evidence of left renal artery stenosis. LRV flow present.  Mesenteric:  Areas of limited visceral study include mesenteric arteries.      Assessment/Plan:     43 year old female with history of left renal artery stenosis and uncontrolled hypertension despite 5 medications.  Renal artery stenosis not demonstrated on today's duplex but was difficult due to patient's habitus.  We have discussed continuing medical therapy versus CT scan.  She has elected for CTA we will get this performed in the next month and I will see her back to discuss results.  All questions were answered she demonstrates good understanding.     Penne Lonni Colorado MD [facility]

## 2024-07-13 ENCOUNTER — Other Ambulatory Visit: Payer: Self-pay | Admitting: Student

## 2024-07-28 ENCOUNTER — Ambulatory Visit (HOSPITAL_COMMUNITY)
Admission: RE | Admit: 2024-07-28 | Discharge: 2024-07-28 | Disposition: A | Discharge status: Home / Self Care | Source: Ambulatory Visit | Attending: Vascular Surgery | Admitting: Vascular Surgery

## 2024-07-28 DIAGNOSIS — I701 Atherosclerosis of renal artery: Secondary | ICD-10-CM | POA: Insufficient documentation

## 2024-07-28 DIAGNOSIS — E1169 Type 2 diabetes mellitus with other specified complication: Secondary | ICD-10-CM | POA: Diagnosis present

## 2024-07-28 LAB — POCT I-STAT CREATININE: Creatinine, Ser: 0.8 mg/dL (ref 0.44–1.00)

## 2024-07-28 MED ORDER — IOHEXOL 350 MG/ML SOLN
75.0000 mL | Freq: Once | INTRAVENOUS | Status: AC | PRN
  Administered 2024-07-28: 75 mL via INTRAVENOUS

## 2024-07-29 ENCOUNTER — Encounter: Payer: Self-pay | Admitting: Vascular Surgery

## 2024-07-29 ENCOUNTER — Ambulatory Visit: Attending: Vascular Surgery | Admitting: Vascular Surgery

## 2024-07-29 VITALS — BP 189/130 | HR 75 | Temp 97.9°F | Ht 67.0 in | Wt 360.0 lb

## 2024-07-29 DIAGNOSIS — Z8679 Personal history of other diseases of the circulatory system: Secondary | ICD-10-CM | POA: Diagnosis not present

## 2024-07-29 NOTE — Progress Notes (Signed)
 Patient ID: Maria Carpenter, female   DOB: 10-Nov-1980, 43 y.o.   MRN: [id number]  Reason for Consult: Follow-up   Referred by Tobie Gaines, DO  Subjective:     HPI:  Maria Carpenter is a 43 y.o. female history of uncontrolled hypertension on 5 medications with concern for renal artery stenosis.  At last evaluation we had elected for CT angio which was performed yesterday and she now follows up to discuss results.  Past Medical History:  Diagnosis Date   Anemia    Asthma    Chlamydia 2012   treated   Complication of anesthesia     they have trouble waking me up   Diabetes mellitus    A2DM during pregnancy. now on metformin .    Dyspnea 05/07/2019   Dysuria 04/11/2019   Family history of anesthesia complication    mother & daughter    GERD (gastroesophageal reflux disease)    Gonorrhea 2010   treated   Hypertension    Lupus 10-2013   Morbid obesity (HCC) 07/22/2011   Pregnancy induced hypertension    pre E with pregnancies   Rheumatoid arthritis(714.0)    Shortness of breath    Sleep apnea    does not use cpap   Stroke [facility]) 2012   March 2012>right side deficit (speech, upper/ower weakness)   SVT (supraventricular tachycardia)    Family History  Problem Relation Age of Onset   Anesthesia problems Mother        difficult to arouse, SOB   Diabetes Mother    Hypertension Mother    Stroke Mother    Diabetes Father    Hypertension Father    Heart disease Father    Cancer Maternal Aunt    Cancer Maternal Grandmother    Breast cancer Maternal Grandmother    Breast cancer Cousin    Past Surgical History:  Procedure Laterality Date   CHOLECYSTECTOMY  07/22/2011   Procedure: LAPAROSCOPIC CHOLECYSTECTOMY WITH INTRAOPERATIVE CHOLANGIOGRAM;  Surgeon: Alm VEAR Angle, MD;  Location: [facility];  Service: General;  Laterality: N/A;   ERCP  07/24/2011   Procedure: ENDOSCOPIC RETROGRADE CHOLANGIOPANCREATOGRAPHY (ERCP);  Surgeon: Belvie JONETTA Just;  Location: [facility];   Service: Endoscopy;  Laterality: N/A;   FINGER SURGERY  1993   left ring finger to correct bone problem   TUBAL LIGATION  2009    Short Social History:  Social History   Tobacco Use   Smoking status: Every Day    Current packs/day: 0.25    Average packs/day: 0.3 packs/day for 4.0 years (1.0 ttl pk-yrs)    Types: Cigarettes   Smokeless tobacco: Never   Tobacco comments:    15 cig/day  Substance Use Topics   Alcohol use: Yes    Comment: socially    Allergies  Allergen Reactions   Amoxicillin Anaphylaxis and Rash    Has patient had a PCN reaction causing immediate rash, facial/tongue/throat swelling, SOB or lightheadedness with hypotension: Yes Has patient had a PCN reaction causing severe rash involving mucus membranes or skin necrosis: Yes Has patient had a PCN reaction that required hospitalization Yes Has patient had a PCN reaction occurring within the last 10 years: No If all of the above answers are NO, then may proceed with Cephalosporin use.    Cashew Nut (Anacardium Occidentale) Skin Test Other (See Comments)    Lips burning, tingling, and swelling   Dilaudid  [Hydromorphone  Hcl] Itching   Egg Protein-Containing Drug Products Nausea And Vomiting and Rash  Pineapple Rash    Current Outpatient Medications  Medication Sig Dispense Refill   Accu-Chek Softclix Lancets lancets Check blood sugar 3 times a day 100 each 11   albuterol  (VENTOLIN  HFA) 108 (90 Base) MCG/ACT inhaler Inhale 1 puff into the lungs every 4 (four) hours as needed for wheezing or shortness of breath. 18 each 3   amLODipine  (NORVASC ) 10 MG tablet Take 1 tablet (10 mg total) by mouth daily. 90 tablet 1   Blood Glucose Monitoring Suppl (ACCU-CHEK GUIDE) w/Device KIT 1 each by Does not apply route 4 (four) times daily. Check blood sugar 3 times a day 1 kit 1   chlorhexidine  (PERIDEX ) 0.12 % solution Use as directed 15 mLs in the mouth or throat 2 (two) times daily. 120 mL 0   chlorthalidone  (HYGROTON )  50 MG tablet Take 1 tablet (50 mg total) by mouth daily. 30 tablet 11   Cholecalciferol  25 MCG (1000 UT) capsule Take 1 capsule (1,000 Units total) by mouth daily. 60 capsule 2   Continuous Glucose Receiver (DEXCOM G6 RECEIVER) DEVI Use to check blood sugar at least 6 times a day 1 each 0   Continuous Glucose Sensor (DEXCOM G6 SENSOR) MISC Check blood sugar at least 6 times a day 3 each 11   Continuous Glucose Transmitter (DEXCOM G6 TRANSMITTER) MISC Use to check blood sugar at least 6 times a day 1 each 3   diclofenac  Sodium (VOLTAREN ) 1 % GEL Apply 4 g topically 4 (four) times daily. 100 g 0   gabapentin  (NEURONTIN ) 300 MG capsule Take 1 capsule (300 mg total) by mouth at bedtime. 90 capsule 3   glucose blood (ACCU-CHEK GUIDE TEST) test strip Use as instructed 100 each 12   hyoscyamine  (LEVSIN ) 0.125 MG tablet TAKE 1 TABLET (0.125 MG TOTAL) BY MOUTH 3 (THREE) TIMES DAILY AS NEEDED. 270 tablet 1   ibuprofen  (ADVIL ) 600 MG tablet Take 1 tablet (600 mg total) by mouth every 8 (eight) hours as needed for mild pain (pain score 1-3) or moderate pain (pain score 4-6). 15 tablet 0   insulin  glargine (LANTUS ) 100 UNIT/ML Solostar Pen Inject 12 Units into the skin daily. 15 mL 11   Insulin  Pen Needle (PEN NEEDLES) 32G X 5 MM MISC Use to inject insulin  once daily 100 each 3   lisinopril  (ZESTRIL ) 40 MG tablet Take 1 tablet (40 mg total) by mouth daily. 90 tablet 3   magic mouthwash (nystatin , lidocaine , diphenhydrAMINE ) suspension Take 5 mLs by mouth 4 (four) times daily as needed for mouth pain. 180 mL 0   metFORMIN  (GLUCOPHAGE ) 500 MG tablet Take 2 tablets (1,000 mg total) by mouth 2 (two) times daily with a meal. 360 tablet 3   Multiple Vitamin (MULTIVITAMIN WITH MINERALS) TABS tablet Take 1 tablet by mouth daily.     nystatin  (MYCOSTATIN /NYSTOP ) powder APPLY TOPICALLY 3 TIMES A DAY 15 g 0   oxyCODONE  (ROXICODONE ) 5 MG immediate release tablet Take 1 tablet (5 mg total) by mouth every 6 (six) hours as  needed for up to 10 doses. 10 tablet 0   pantoprazole  (PROTONIX ) 20 MG tablet TAKE 1 TABLET BY MOUTH EVERY DAY 90 tablet 1   Probiotic Product (PROBIOTIC PO) Take 1 capsule by mouth daily.     propranolol  (INDERAL ) 20 MG tablet Take 2 tablets (40 mg total) by mouth 2 (two) times daily. 360 tablet 1   rosuvastatin  (CRESTOR ) 20 MG tablet Take 1 tablet (20 mg total) by mouth daily. 90 tablet 3  spironolactone  (ALDACTONE ) 25 MG tablet Take 1 tablet (25 mg total) by mouth daily. 30 tablet 3   Vitamin D , Ergocalciferol , (DRISDOL ) 1.25 MG (50000 UNIT) CAPS capsule Take 1 capsule (50,000 Units total) by mouth every 7 (seven) days. 8 capsule 0   No current facility-administered medications for this visit.    Review of Systems  Constitutional:  Constitutional negative. HENT: HENT negative.  Eyes: Eyes negative.  Respiratory: Respiratory negative.  Cardiovascular: Cardiovascular negative.  GI: Gastrointestinal negative.  Musculoskeletal: Musculoskeletal negative.  Skin: Skin negative.  Neurological: Neurological negative. Hematologic: Hematologic/lymphatic negative.  Psychiatric: Psychiatric negative.        Objective:  Objective   Vitals:   07/29/24 0932  BP: (!) 189/130  Pulse: 75  Temp: 97.9 F (36.6 C)  SpO2: 97%  Weight: (!) 360 lb (163.3 kg)  Height: 5' 7 (1.702 m)   Body mass index is 56.38 kg/m.  Physical Exam Constitutional:      Appearance: She is obese.  HENT:     Head: Normocephalic.     Nose: Nose normal.  Eyes:     Pupils: Pupils are equal, round, and reactive to light.  Cardiovascular:     Rate and Rhythm: Normal rate.  Pulmonary:     Effort: Pulmonary effort is normal.  Abdominal:     General: Abdomen is flat.  Musculoskeletal:        General: Normal range of motion.     Cervical back: Normal range of motion and neck supple.     Right lower leg: Edema present.     Left lower leg: Edema present.  Skin:    General: Skin is warm.     Capillary  Refill: Capillary refill takes less than 2 seconds.  Neurological:     General: No focal deficit present.     Mental Status: She is alert. Mental status is at baseline.  Psychiatric:        Mood and Affect: Mood normal.     Data: CTA reviewed with patient     Assessment/Plan:     43 year old female with uncontrolled hypertension without evidence of renal artery stenosis by duplex or CTA.  Official read of the CT is pending.  She can follow-up with me on an as-needed basis.     Penne Lonni Colorado MD [facility]

## 2024-08-15 ENCOUNTER — Other Ambulatory Visit: Payer: Self-pay | Admitting: Student

## 2024-08-15 DIAGNOSIS — I10 Essential (primary) hypertension: Secondary | ICD-10-CM

## 2024-08-17 NOTE — Telephone Encounter (Signed)
 Medication sent to pharmacy

## 2024-08-24 NOTE — Progress Notes (Unsigned)
 "  Office Visit Note  Patient: Maria Carpenter             Date of Birth: [birth year]           MRN: [id number]             PCP: Tobie Gaines, DO Referring: Karna Fellows, MD Visit Date: 08/25/2024 Occupation: @GUAROCC @  Subjective:  No chief complaint on file.    History of Present Illness: Maria Carpenter is a 43 y.o. female ***   Activities of Daily Living:  Patient reports morning stiffness for *** {minute/hour:19697}.   Patient {ACTIONS;DENIES/REPORTS:21021675::Denies} nocturnal pain.  Difficulty dressing/grooming: {ACTIONS;DENIES/REPORTS:21021675::Denies} Difficulty climbing stairs: {ACTIONS;DENIES/REPORTS:21021675::Denies} Difficulty getting out of chair: {ACTIONS;DENIES/REPORTS:21021675::Denies} Difficulty using hands for taps, buttons, cutlery, and/or writing: {ACTIONS;DENIES/REPORTS:21021675::Denies}  No Rheumatology ROS completed.    Rheum History: # Diagnosed in ***.  Manifestation of disease:   Serologies: (+) *** (-) ***  Maintenance Labs: QuantiFERON: *** Hepatitis panel: ***  Current Treatment ***  Prior Treatments ***   PMFS History:  Patient Active Problem List   Diagnosis Date Noted   Abdominal pain 06/01/2022   Depression 06/29/2020   Left renal artery stenosis 06/29/2020   Vitamin D  deficiency 06/29/2020   Low back pain 06/10/2019   Hidradenitis suppurativa 04/23/2019   Fatigue 04/11/2019   Asthma    Breast lump    Abnormal uterine bleeding (AUB) 07/25/2017   Systemic lupus erythematosus (HCC) 10/25/2013   TIA (transient ischemic attack) 07/02/2012   Diabetes (HCC) 07/02/2012   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 now on metformin .    Dyspnea 05/07/2019   Dysuria 04/11/2019   Family history of anesthesia complication    mother & daughter    GERD (gastroesophageal reflux disease)    Gonorrhea 2010   treated   Hypertension    Lupus 10-2013   Morbid obesity (HCC) 07/22/2011   Pregnancy induced hypertension    pre E with pregnancies   Rheumatoid arthritis(714.0)    Shortness of breath    Sleep apnea    does not use cpap   Stroke [facility]) 2012   March 2012>right side deficit (speech, upper/ower weakness)   SVT (supraventricular tachycardia)     Family History  Problem Relation Age of Onset   Anesthesia problems Mother        difficult to arouse, SOB   Diabetes Mother    Hypertension Mother    Stroke Mother    Diabetes Father    Hypertension Father    Heart disease Father    Cancer Maternal Aunt    Cancer Maternal Grandmother    Breast cancer Maternal Grandmother    Breast cancer Cousin    Past Surgical History:  Procedure Laterality Date   CHOLECYSTECTOMY  07/22/2011   Procedure: LAPAROSCOPIC CHOLECYSTECTOMY WITH INTRAOPERATIVE CHOLANGIOGRAM;  Surgeon: Alm VEAR Angle, MD;  Location: [facility];  Service: General;  Laterality: N/A;   ERCP  07/24/2011   Procedure: ENDOSCOPIC RETROGRADE CHOLANGIOPANCREATOGRAPHY (ERCP);  Surgeon: Belvie JONETTA Just;  Location: [facility];  Service: Endoscopy;  Laterality: N/A;   FINGER SURGERY  1993   left ring finger to correct bone problem   TUBAL LIGATION  2009   Social History   Social History Narrative   Not on file   Immunization History  Administered Date(s) Administered   Influenza Split 07/24/2011   Influenza, Seasonal, Injecte, Preservative Fre 05/14/2024   Influenza,inj,Quad PF,6+ Mos 05/07/2019   Pneumococcal Polysaccharide-23 07/24/2011     Objective: Vital Signs:  There were no vitals taken for this visit.   Physical Exam Vitals and nursing note reviewed.  HENT:     Head: Normocephalic and atraumatic.     Nose: Nose normal.  Eyes:     Conjunctiva/sclera: Conjunctivae normal.     Pupils: Pupils are equal, round, and reactive to light.  Cardiovascular:     Rate and Rhythm: Normal rate and regular rhythm.     Heart sounds: Normal heart sounds.  Pulmonary:     Effort: Pulmonary effort is normal.     Breath sounds: Normal breath sounds.  Skin:    General: Skin is warm and dry.  Neurological:     Mental Status: She is alert. Mental status is at baseline.  Psychiatric:        Mood and Affect: Mood normal.        Behavior: Behavior normal.      Musculoskeletal Exam: ***  CDAI Exam: CDAI Score: -- Patient Global: --; Provider Global: -- Swollen: --; Tender: -- Joint Exam 08/25/2024   No joint exam has been documented for this visit   There is currently no information documented on the homunculus. Go to the Rheumatology activity and complete the homunculus joint exam.  Investigation: No additional findings.  Imaging: CT ANGIO ABDOMEN PELVIS  W & WO CONTRAST Result Date: 07/29/2024 CLINICAL DATA:  Left renal artery stenosis EXAM: CTA ABDOMEN AND PELVIS WITHOUT AND WITH CONTRAST TECHNIQUE: Multidetector CT imaging of the abdomen and pelvis was performed using the standard protocol during bolus administration of intravenous contrast. Multiplanar reconstructed images and MIPs were obtained and reviewed to evaluate the vascular anatomy. RADIATION DOSE REDUCTION: This exam was performed according to the departmental dose-optimization program which includes automated exposure control, adjustment of the mA and/or kV according to patient size and/or use of iterative reconstruction technique. CONTRAST:  75mL OMNIPAQUE  IOHEXOL  350 MG/ML SOLN COMPARISON:  None Available. FINDINGS: VASCULAR Aorta: Patent without aneurysm. Celiac: Patent. SMA: Patent.  Renals: Single right renal artery without evidence of flow-limiting stenosis. Single left renal artery without evidence of flow-limiting stenosis. IMA: Patent. Inflow: Mild atherosclerotic changes bilaterally without evidence of flow-limiting stenosis. Proximal Outflow: Patent. Veins: No obvious venous abnormality within the limitations of this arterial phase study. Review of the MIP images confirms the above findings. NON-VASCULAR Lower chest: No acute abnormality. Hepatobiliary: No focal liver abnormality is seen. Status post cholecystectomy. No biliary dilatation. Pancreas: Unremarkable. No pancreatic ductal dilatation or surrounding inflammatory changes. Spleen: Normal in size without focal abnormality. Adrenals/Urinary Tract: Adrenal glands are unremarkable. Kidneys are normal, without renal calculi, focal lesion, or hydronephrosis. Bladder is unremarkable. Stomach/Bowel: Stomach is within normal limits. Appendix appears normal. No evidence of bowel wall thickening, distention, or inflammatory changes. Lymphatic: No evidence of significant lymphadenopathy. Reproductive: Uterus and bilateral adnexa are unremarkable. Other: Nothing significant. Musculoskeletal: Mild degenerative changes in the lumbar spine. IMPRESSION: 1. No CT evidence of significant visceral artery stenosis. Electronically Signed   By: Maude Naegeli M.D.   On: 07/29/2024 12:03    Recent Labs: Lab Results  Component Value Date   WBC 8.3 04/12/2024   HGB 11.6 (L) 04/12/2024   PLT 264 04/12/2024   NA 135 04/12/2024   K 3.9 04/12/2024   CL 99 04/12/2024   CO2 25 04/12/2024   GLUCOSE 185 (H) 04/12/2024   BUN 13 04/12/2024   CREATININE 0.80 07/28/2024   BILITOT 0.2 04/12/2024   ALKPHOS 96 04/12/2024   AST 15 04/12/2024   ALT 9 04/12/2024   PROT 7.3 04/12/2024   ALBUMIN 4.0 04/12/2024   CALCIUM  9.2 04/12/2024   GFRAA 128 03/26/2019   Lab Results  Component Value Date   ANA NEGATIVE 10/06/2007    Speciality Comments: No  specialty comments available.  Procedures:  No procedures performed Allergies: Amoxicillin, Anacardium occidentale, Dilaudid  [hydromorphone  hcl], Egg protein-containing drug products, and Pineapple   Assessment / Plan:     Visit Diagnoses: No diagnosis found.  #High risk medication use  Orders: No orders of the defined types were placed in this encounter.  No orders of the defined types were placed in this encounter.   I personally spent a total of *** minutes in the care of the patient today including {Time Based Coding:210964241}.  Follow-Up Instructions: No follow-ups on file.   Asberry Claw, DO "

## 2024-08-25 ENCOUNTER — Ambulatory Visit

## 2024-08-25 VITALS — BP 213/131 | HR 94 | Temp 97.7°F | Resp 14 | Ht 67.0 in | Wt 369.4 lb

## 2024-08-25 DIAGNOSIS — Z8673 Personal history of transient ischemic attack (TIA), and cerebral infarction without residual deficits: Secondary | ICD-10-CM | POA: Insufficient documentation

## 2024-08-25 DIAGNOSIS — M791 Myalgia, unspecified site: Secondary | ICD-10-CM | POA: Diagnosis present

## 2024-08-25 DIAGNOSIS — R7689 Other specified abnormal immunological findings in serum: Secondary | ICD-10-CM | POA: Diagnosis present

## 2024-08-25 DIAGNOSIS — R519 Headache, unspecified: Secondary | ICD-10-CM | POA: Diagnosis present

## 2024-08-25 DIAGNOSIS — G8929 Other chronic pain: Secondary | ICD-10-CM | POA: Insufficient documentation

## 2024-08-25 DIAGNOSIS — L732 Hidradenitis suppurativa: Secondary | ICD-10-CM | POA: Diagnosis present

## 2024-08-25 DIAGNOSIS — K921 Melena: Secondary | ICD-10-CM | POA: Insufficient documentation

## 2024-08-25 DIAGNOSIS — Z8759 Personal history of other complications of pregnancy, childbirth and the puerperium: Secondary | ICD-10-CM | POA: Diagnosis present

## 2024-08-25 DIAGNOSIS — I1A0 Resistant hypertension: Secondary | ICD-10-CM | POA: Diagnosis present

## 2024-08-25 DIAGNOSIS — I639 Cerebral infarction, unspecified: Secondary | ICD-10-CM | POA: Insufficient documentation

## 2024-08-25 DIAGNOSIS — N939 Abnormal uterine and vaginal bleeding, unspecified: Secondary | ICD-10-CM | POA: Insufficient documentation

## 2024-08-25 DIAGNOSIS — Z800 Family history of malignant neoplasm of digestive organs: Secondary | ICD-10-CM | POA: Diagnosis present

## 2024-08-25 DIAGNOSIS — I824Z9 Acute embolism and thrombosis of unspecified deep veins of unspecified distal lower extremity: Secondary | ICD-10-CM | POA: Insufficient documentation

## 2024-08-25 DIAGNOSIS — M545 Low back pain, unspecified: Secondary | ICD-10-CM | POA: Diagnosis present

## 2024-08-25 DIAGNOSIS — M2550 Pain in unspecified joint: Secondary | ICD-10-CM | POA: Insufficient documentation

## 2024-08-25 DIAGNOSIS — R32 Unspecified urinary incontinence: Secondary | ICD-10-CM | POA: Insufficient documentation

## 2024-08-25 DIAGNOSIS — D6859 Other primary thrombophilia: Secondary | ICD-10-CM | POA: Insufficient documentation

## 2024-08-25 NOTE — Progress Notes (Deleted)
" ° °  Procedure Note  Patient: Maria Carpenter             Date of Birth: November 12, 1980           MRN: [id number]             Visit Date: 08/25/2024  Procedures: Visit Diagnoses:  1. Hidradenitis suppurativa     No procedures performed    "

## 2024-08-25 NOTE — Progress Notes (Unsigned)
 "  Office Visit Note  Patient: Maria Carpenter             Date of Birth: 04-30-1981           MRN: [id number]             PCP: Tobie Gaines, DO Referring: Karna Fellows, MD Visit Date: 08/25/2024 Occupation: home maker  Subjective:  New Patient (Initial Visit) (Patient states she has never been on medication for Lupus. Patient states she received the diagnosis of Lupus in 2012)  Discussed the use of AI scribe software for clinical note transcription with the patient, who gave verbal consent to proceed.  History of Present Illness Maria Carpenter is a 43 year old female with juvenile rheumatoid arthritis and lupus presenting to establish care.   She has a long-standing history of juvenile rheumatoid arthritis, diagnosed at age 61, with symptoms including fever, stiffness, and rash. She states that she never saw a rheumatologist for this diagnosis, and was treated with ibuprofen . She states that she was continued on ibuprofen  until very recently, when she was finally told to stop it due to BP concerns.  Her joint pain, particularly in the knee, lower back, and right hand, has worsened recently.   In 2012, she was diagnosed with lupus following a series of strokes and a transient ischemic attack. She experiences photosensitivity, leading to stiffness, fever, and fatigue after sun exposure, along with hair thinning and significant fatigue. She has not been on specific treatment for lupus and has not consulted a rheumatologist since childhood.  She has a history of hypertension since her first pregnancy at age 86, which has been resistant to multiple medications. Despite a history of supraventricular tachycardia and left heart changes noted in an echocardiogram, she has not been referred to a cardiologist. She reports that a recent CT scan of her kidneys showed no stenosis, although she was previously told she had renal artery stenosis.  She has a significant history of blood clots, including  during pregnancies, and has experienced multiple miscarriages. She was treated with Lovenox  during and after pregnancies. She reports passing large clots during pregnancy and during menstruation.  She experiences frequent migraines since age 39, which are debilitating and require her to rest in a dark room. She has not seen a neurologist for these headaches.  She reports systemic symptoms including swelling in her ankles and hands, which sometimes improves with elevation but can take days to resolve. Significant fatigue and joint pain impact her ability to care for her grandchildren.  Her family history includes autoimmune diseases such as lupus and rheumatoid arthritis in both her parents. She also has a personal history of hidradenitis suppurativa, which affects her children as well.  She reports gastrointestinal issues, including severe pain when eating or drinking on an empty stomach, and has a history of gallbladder removal. She has been told she might have Crohn's disease or IBS, but this has not been confirmed.  She has a history of asthma since childhood and has experienced hearing loss, which she attributes to long-standing issues since childhood.    Activities of Daily Living:  Patient reports morning stiffness for 24 hours.   Patient Reports nocturnal pain.  Difficulty dressing/grooming: Reports Difficulty climbing stairs: Reports Difficulty getting out of chair: Denies Difficulty using hands for taps, buttons, cutlery, and/or writing: Denies  Review of Systems  Constitutional:  Positive for fatigue.  HENT:  Positive for mouth dryness. Negative for mouth sores.   Eyes:  Positive for dryness.  Respiratory:  Positive for shortness of breath.   Cardiovascular:  Positive for chest pain and palpitations.  Gastrointestinal:  Positive for constipation. Negative for blood in stool and diarrhea.  Endocrine: Positive for increased urination.  Genitourinary:  Negative for involuntary  urination.  Musculoskeletal:  Positive for joint pain, gait problem, joint pain, joint swelling, myalgias, muscle weakness, morning stiffness, muscle tenderness and myalgias.  Skin:  Positive for rash, hair loss and sensitivity to sunlight. Negative for color change.  Allergic/Immunologic: Positive for susceptible to infections.  Neurological:  Positive for dizziness and headaches.  Hematological:  Negative for swollen glands.  Psychiatric/Behavioral:  Positive for depressed mood and sleep disturbance. The patient is nervous/anxious.     PMFS History:  Patient Active Problem List   Diagnosis Date Noted   Abdominal pain 06/01/2022   Depression 06/29/2020   Left renal artery stenosis 06/29/2020   Vitamin D  deficiency 06/29/2020   Low back pain 06/10/2019   Hidradenitis suppurativa 04/23/2019   Fatigue 04/11/2019   Asthma    Breast lump    Abnormal uterine bleeding (AUB) 07/25/2017   Systemic lupus erythematosus (HCC) 10/25/2013   TIA (transient ischemic attack) 07/02/2012   Diabetes (HCC) 07/02/2012   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   GERD (gastroesophageal reflux disease) 07/02/2012   Obstructive sleep apnea 07/02/2012   CTS (carpal tunnel syndrome) 07/02/2012   Morbid obesity (HCC) 07/22/2011   History of CVA (cerebrovascular accident) 2012    Past Medical History:  Diagnosis Date   Anemia    Asthma    Chlamydia 2012   treated   Complication of anesthesia     they have trouble waking me up   Diabetes mellitus    A2DM during pregnancy. now on metformin .    Dyspnea 05/07/2019   Dysuria 04/11/2019   Family history of anesthesia complication    mother & daughter    GERD (gastroesophageal reflux disease)    Gonorrhea 2010   treated   Hypertension    Lupus 10-2013   Morbid obesity (HCC) 07/22/2011   Pregnancy induced hypertension    pre E with pregnancies   Rheumatoid arthritis(714.0)    Shortness of breath    Sleep apnea     does not use cpap   Stroke [facility]) 2012   March 2012>right side deficit (speech, upper/ower weakness)   SVT (supraventricular tachycardia)     Family History  Problem Relation Age of Onset   Cancer Mother    Anesthesia problems Mother        difficult to arouse, SOB   Diabetes Mother    Hypertension Mother    Stroke Mother    Cancer Father    Diabetes Father    Hypertension Father    Heart disease Father    Hypertension Sister    Diabetes Sister    Diabetes Brother    Hypertension Brother    Cancer Maternal Grandmother    Breast cancer Maternal Grandmother    Colon cancer Maternal Grandmother    Cancer Maternal Grandfather    Stroke Paternal Grandmother    Cancer Maternal Aunt    Breast cancer Cousin    Asthma Son    Autism spectrum disorder Son    Eczema Son    Eczema Son    Asthma Son    Autism spectrum disorder Son    Supraventricular tachycardia Son    Eczema Daughter    Asthma Daughter    Hypertension Daughter    Autism spectrum disorder Daughter  Epilepsy Daughter    Eczema Daughter    Asthma Daughter    Hypertension Daughter    Autism spectrum disorder Daughter    Eczema Daughter    Asthma Daughter    Diabetes Daughter    Hypertension Daughter    Autism spectrum disorder Daughter    Eczema Daughter    Asthma Daughter    Hypertension Daughter    Autism spectrum disorder Daughter    Past Surgical History:  Procedure Laterality Date   CHOLECYSTECTOMY  07/22/2011   Procedure: LAPAROSCOPIC CHOLECYSTECTOMY WITH INTRAOPERATIVE CHOLANGIOGRAM;  Surgeon: Alm VEAR Angle, MD;  Location: [facility];  Service: General;  Laterality: N/A;   ERCP  07/24/2011   Procedure: ENDOSCOPIC RETROGRADE CHOLANGIOPANCREATOGRAPHY (ERCP);  Surgeon: Belvie JONETTA Just;  Location: [facility];  Service: Endoscopy;  Laterality: N/A;   FINGER SURGERY  1993   left ring finger to correct bone problem   TUBAL LIGATION  2009   Social  History[1] Social History   Social History Narrative   Not on file     Immunization History  Administered Date(s) Administered   Influenza Split 07/24/2011   Influenza, Seasonal, Injecte, Preservative Fre 05/14/2024   Influenza,inj,Quad PF,6+ Mos 05/07/2019   Pneumococcal Polysaccharide-23 07/24/2011     Objective: Vital Signs: BP (!) 213/131 (BP Location: Right Arm, Patient Position: Sitting, Cuff Size: Normal)   Pulse 94   Temp 97.7 F (36.5 C)   Resp 14   Ht 5' 7 (1.702 m)   Wt (!) 369 lb 6.4 oz (167.6 kg)   LMP 07/27/2024 (Approximate)   BMI 57.86 kg/m    Physical Exam Vitals and nursing note reviewed.  HENT:     Head: Normocephalic and atraumatic.     Nose: Nose normal.  Eyes:     Conjunctiva/sclera: Conjunctivae normal.     Pupils: Pupils are equal, round, and reactive to light.  Pulmonary:     Effort: Pulmonary effort is normal. No respiratory distress.  Musculoskeletal:     Comments: Jaccoud's arthropathy multiple digits b/l hands  Skin:    General: Skin is warm and dry.  Neurological:     Mental Status: She is alert. Mental status is at baseline.  Psychiatric:        Mood and Affect: Mood normal.        Behavior: Behavior normal.      CDAI Exam: CDAI Score: -- Patient Global: --; Provider Global: -- Swollen: --; Tender: -- Joint Exam 08/25/2024   No joint exam has been documented for this visit   There is currently no information documented on the homunculus. Go to the Rheumatology activity and complete the homunculus joint exam.  Investigation: No additional findings.  Imaging: CT ANGIO ABDOMEN PELVIS  W & WO CONTRAST Result Date: 07/29/2024 CLINICAL DATA:  Left renal artery stenosis EXAM: CTA ABDOMEN AND PELVIS WITHOUT AND WITH CONTRAST TECHNIQUE: Multidetector CT imaging of the abdomen and pelvis was performed using the standard protocol during bolus administration of intravenous contrast. Multiplanar reconstructed images and MIPs  were obtained and reviewed to evaluate the vascular anatomy. RADIATION DOSE REDUCTION: This exam was performed according to the departmental dose-optimization program which includes automated exposure control, adjustment of the mA and/or kV according to patient size and/or use of iterative reconstruction technique. CONTRAST:  75mL OMNIPAQUE  IOHEXOL  350 MG/ML SOLN COMPARISON:  None Available. FINDINGS: VASCULAR Aorta: Patent without aneurysm. Celiac: Patent. SMA: Patent. Renals: Single right renal artery without evidence of flow-limiting stenosis. Single left renal artery without evidence of  flow-limiting stenosis. IMA: Patent. Inflow: Mild atherosclerotic changes bilaterally without evidence of flow-limiting stenosis. Proximal Outflow: Patent. Veins: No obvious venous abnormality within the limitations of this arterial phase study. Review of the MIP images confirms the above findings. NON-VASCULAR Lower chest: No acute abnormality. Hepatobiliary: No focal liver abnormality is seen. Status post cholecystectomy. No biliary dilatation. Pancreas: Unremarkable. No pancreatic ductal dilatation or surrounding inflammatory changes. Spleen: Normal in size without focal abnormality. Adrenals/Urinary Tract: Adrenal glands are unremarkable. Kidneys are normal, without renal calculi, focal lesion, or hydronephrosis. Bladder is unremarkable. Stomach/Bowel: Stomach is within normal limits. Appendix appears normal. No evidence of bowel wall thickening, distention, or inflammatory changes. Lymphatic: No evidence of significant lymphadenopathy. Reproductive: Uterus and bilateral adnexa are unremarkable. Other: Nothing significant. Musculoskeletal: Mild degenerative changes in the lumbar spine. IMPRESSION: 1. No CT evidence of significant visceral artery stenosis. Electronically Signed   By: Maude Naegeli M.D.   On: 07/29/2024 12:03    Recent Labs: Lab Results  Component Value Date   WBC 8.3 04/12/2024   HGB 11.6 (L) 04/12/2024    PLT 264 04/12/2024   NA 135 04/12/2024   K 3.9 04/12/2024   CL 99 04/12/2024   CO2 25 04/12/2024   GLUCOSE 185 (H) 04/12/2024   BUN 13 04/12/2024   CREATININE 0.80 07/28/2024   BILITOT 0.2 04/12/2024   ALKPHOS 96 04/12/2024   AST 15 04/12/2024   ALT 9 04/12/2024   PROT 7.3 04/12/2024   ALBUMIN 4.0 04/12/2024   CALCIUM  9.2 04/12/2024   GFRAA 128 03/26/2019    Speciality Comments: No specialty comments available.  Procedures:  No procedures performed Allergies: Amoxicillin, Cashew nut oil, Anacardium occidentale, Dilaudid  [hydromorphone  hcl], Lidocaine , Egg protein-containing drug products, and Pineapple   Assessment / Plan:     Visit Diagnoses:   Polyarthralgia  Patient with extremely complex medical hx w/ prior dx of juvenile RA and SLE. Unclear exactly how dx of JRA came about given those records are not available. It is also unclear how dx of SLE was established. She does admit to photosensitivity, but states she was told it was a dx of lupus when she had her CVA (lupus anticoagulant positive but ANA negative, no other lupus serologies checked).   At this time, will check ANA, Protein / creatinine ratio, urine, C3 and C4, Anti-DNA antibody, double-stranded, Anti-Smith antibody, Lupus Anticoagulant Eval w/Reflex, Beta-2 glycoprotein antibodies, Cardiolipin antibodies, IgG, IgM, IgA, Rheumatoid factor, Cyclic citrul peptide antibody, IgG, Sedimentation rate, C-reactive protein, Phosphatidylserine/Prothrombin (PS/PT) Antibodies (IgG, IgM), RNP Antibody, Sjogren's syndrome antibods(ssa + ssb). Will also obtain DG Hand 2 View Right, DG Hand 2 View Left  Hidradenitis suppurativa - Plan: Ambulatory referral to Dermatology  Resistant hypertension  I am very concerned about patient's resistant hypertension. Plan: Ambulatory referral to Cardiology, Ambulatory referral to Nephrology  Low back pain  Plan: DG Lumbar Spine 2-3 Views  Cerebrovascular accident (CVA), unspecified  mechanism (HCC) Deep vein thrombosis (DVT)  History of miscarriage Protein S deficiency? Plan: Lupus Anticoagulant Eval w/Reflex, Beta-2 glycoprotein antibodies, Cardiolipin antibodies, IgG, IgM, IgA, Phosphatidylserine/Prothrombin (PS/PT) Antibodies (IgG, IgM)  Myalgia  Plan: CK  Nonintractable headache, unspecified chronicity pattern, unspecified headache type - Plan: Ambulatory referral to Neurology  Bloody stool Family history of malignant neoplasm of colon  Plan: Ambulatory referral to Gastroenterology  Urinary incontinence, unspecified type Abnormal uterine bleeding  Plan: Ambulatory referral to Gynecology  Orders: Orders Placed This Encounter  Procedures   DG Hand 2 View Right   DG Hand 2 View  Left   DG Lumbar Spine 2-3 Views   ANA   Protein / creatinine ratio, urine   C3 and C4   Anti-DNA antibody, double-stranded   Anti-Smith antibody   Lupus Anticoagulant Eval w/Reflex   Beta-2 glycoprotein antibodies   Cardiolipin antibodies, IgG, IgM, IgA   Rheumatoid factor   Cyclic citrul peptide antibody, IgG   Sedimentation rate   C-reactive protein   CK   Phosphatidylserine/Prothrombin (PS/PT) Antibodies (IgG, IgM)   RNP Antibody   Sjogren's syndrome antibods(ssa + ssb)   Protein S activity   Ambulatory referral to Cardiology   Ambulatory referral to Nephrology   Ambulatory referral to Dermatology   Ambulatory referral to Hematology / Oncology   Ambulatory referral to Gynecology   Ambulatory referral to Neurology   Ambulatory referral to Gastroenterology   No orders of the defined types were placed in this encounter.  I personally spent a total of 60 minutes in the care of the patient today including preparing to see the patient, getting/reviewing separately obtained history, performing a medically appropriate exam/evaluation, counseling and educating, placing orders, referring and communicating with other health care professionals,  documenting clinical information in the EHR, independently interpreting results, and communicating results.   Follow-Up Instructions: Return in about 4 weeks (around 09/22/2024).   Asberry Claw, DO  Note - This record has been created using Animal nutritionist.  Chart creation errors have been sought, but may not always  have been located. Such creation errors do not reflect on  the standard of medical care.       [1] Social History Tobacco Use   Smoking status: Every Day    Current packs/day: 0.25    Average packs/day: 0.3 packs/day for 4.0 years (1.0 ttl pk-yrs)    Types: Cigarettes    Passive exposure: Current   Smokeless tobacco: Never   Tobacco comments:    15 cig/day  Vaping Use   Vaping status: Never Used  Substance Use Topics   Alcohol use: Yes    Comment: socially   Drug use: Not Currently    Types: Marijuana    Comment: last month  "

## 2024-08-29 LAB — SEDIMENTATION RATE: Sed Rate: 22 mm/h — ABNORMAL HIGH (ref 0–20)

## 2024-08-29 LAB — C3 AND C4
C3 Complement: 154 mg/dL (ref 83–193)
C4 Complement: 31 mg/dL (ref 15–57)

## 2024-08-29 LAB — PHOSPHATIDYLSERINE/PROTHROMBIN (PS/PT) ANTIBODIES (IGG, IGM)
Phosphatidylserine/Prothrombin Ab (IgG): <9 U
Phosphatidylserine/Prothrombin Ab (IgM): <9 U

## 2024-08-29 LAB — CARDIOLIPIN ANTIBODIES, IGG, IGM, IGA
Anticardiolipin IgA: <2 [APL'U]/mL
Anticardiolipin IgG: <2 [GPL'U]/mL
Anticardiolipin IgM: <2 [MPL'U]/mL

## 2024-08-29 LAB — ANTI-NUCLEAR AB-TITER (ANA TITER): ANA Titer 1: 1:40 {titer} — ABNORMAL HIGH

## 2024-08-29 LAB — PROTEIN S ACTIVITY: Protein S Activity: 54 %{normal} — ABNORMAL LOW (ref 60–140)

## 2024-08-29 LAB — PROTEIN / CREATININE RATIO, URINE
Creatinine, Urine: 88 mg/dL (ref 20–275)
Protein/Creat Ratio: 136 mg/g{creat} (ref 24–184)
Protein/Creatinine Ratio: 0.136 mg/mg{creat} (ref 0.024–0.184)
Total Protein, Urine: 12 mg/dL (ref 5–24)

## 2024-08-29 LAB — ANTI-SMITH ANTIBODY: ENA SM Ab Ser-aCnc: <1 AI

## 2024-08-29 LAB — RNP ANTIBODY: Ribonucleic Protein(ENA) Antibody, IgG: <1 AI

## 2024-08-29 LAB — CYCLIC CITRUL PEPTIDE ANTIBODY, IGG: Cyclic Citrullin Peptide Ab: <16 U

## 2024-08-29 LAB — BETA-2 GLYCOPROTEIN ANTIBODIES
Beta-2 Glyco 1 IgA: <2 U/mL
Beta-2 Glyco 1 IgM: <2 U/mL
Beta-2 Glyco I IgG: <2 U/mL

## 2024-08-29 LAB — ANTI-DNA ANTIBODY, DOUBLE-STRANDED: ds DNA Ab: <1 [IU]/mL

## 2024-08-29 LAB — C-REACTIVE PROTEIN: CRP: 20.1 mg/L — ABNORMAL HIGH

## 2024-08-29 LAB — SJOGREN'S SYNDROME ANTIBODS(SSA + SSB)
SSA (Ro) (ENA) Antibody, IgG: <1 AI
SSB (La) (ENA) Antibody, IgG: <1 AI

## 2024-08-29 LAB — LUPUS ANTICOAGULANT EVAL W/ REFLEX
PTT-LA Screen: 34 s
dRVVT: 37 s

## 2024-08-29 LAB — CK: Total CK: 53 U/L (ref 20–239)

## 2024-08-29 LAB — RHEUMATOID FACTOR: Rheumatoid fact SerPl-aCnc: <10 [IU]/mL

## 2024-08-29 LAB — ANA: Anti Nuclear Antibody (ANA): POSITIVE — AB

## 2024-09-04 NOTE — Progress Notes (Unsigned)
 "     Cardiology Office Note Date:  09/04/2024  ID:  Maria Carpenter, DOB 07-03-1981, MRN [id number] PCP:  Tobie Gaines, DO  Cardiologist:  Joelle VEAR Ren Donley, MD  No chief complaint on file.    Problems Resistant HTN TTE 9/20: 60-65%, mod LVH Renin aldo 2024 N Pending sleep study No evidence of renal stenosis 12/25 IDDM M: AE10, CE50, Insulin , LL40, PL40BID, RN20, SE25  Visits  1/26: LP, why propanolol? Statin expired? HTN referral     ROS: Please see the history of present illness. All other systems are reviewed and negative.   Past Medical History:  Diagnosis Date   Anemia    Asthma    Chlamydia 2012   treated   Complication of anesthesia     they have trouble waking me up   Diabetes mellitus    A2DM during pregnancy. now on metformin .    Dyspnea 05/07/2019   Dysuria 04/11/2019   Family history of anesthesia complication    mother & daughter    GERD (gastroesophageal reflux disease)    Gonorrhea 2010   treated   Hypertension    Lupus 10-2013   Morbid obesity (HCC) 07/22/2011   Pregnancy induced hypertension    pre E with pregnancies   Rheumatoid arthritis(714.0)    Shortness of breath    Sleep apnea    does not use cpap   Stroke [facility]) 2012   March 2012>right side deficit (speech, upper/ower weakness)   SVT (supraventricular tachycardia)     Past Surgical History:  Procedure Laterality Date   CHOLECYSTECTOMY  07/22/2011   Procedure: LAPAROSCOPIC CHOLECYSTECTOMY WITH INTRAOPERATIVE CHOLANGIOGRAM;  Surgeon: Alm VEAR Angle, MD;  Location: [facility];  Service: General;  Laterality: N/A;   ERCP  07/24/2011   Procedure: ENDOSCOPIC RETROGRADE CHOLANGIOPANCREATOGRAPHY (ERCP);  Surgeon: Belvie JONETTA Just;  Location: [facility];  Service: Endoscopy;  Laterality: N/A;   FINGER SURGERY  1993   left ring finger to correct bone problem   TUBAL LIGATION  2009    Current Outpatient Medications  Medication Sig Dispense Refill   Accu-Chek Softclix Lancets lancets  Check blood sugar 3 times a day 100 each 11   albuterol  (VENTOLIN  HFA) 108 (90 Base) MCG/ACT inhaler Inhale 1 puff into the lungs every 4 (four) hours as needed for wheezing or shortness of breath. 18 each 3   amLODipine  (NORVASC ) 10 MG tablet TAKE 1 TABLET BY MOUTH EVERY DAY 90 tablet 1   Blood Glucose Monitoring Suppl (ACCU-CHEK GUIDE) w/Device KIT 1 each by Does not apply route 4 (four) times daily. Check blood sugar 3 times a day 1 kit 1   chlorhexidine  (PERIDEX ) 0.12 % solution Use as directed 15 mLs in the mouth or throat 2 (two) times daily. 120 mL 0   chlorthalidone  (HYGROTON ) 50 MG tablet Take 1 tablet (50 mg total) by mouth daily. 30 tablet 11   Cholecalciferol  25 MCG (1000 UT) capsule Take 1 capsule (1,000 Units total) by mouth daily. 60 capsule 2   Continuous Glucose Receiver (DEXCOM G6 RECEIVER) DEVI Use to check blood sugar at least 6 times a day 1 each 0   Continuous Glucose Sensor (DEXCOM G6 SENSOR) MISC Check blood sugar at least 6 times a day 3 each 11   Continuous Glucose Transmitter (DEXCOM G6 TRANSMITTER) MISC Use to check blood sugar at least 6 times a day 1 each 3   diclofenac  Sodium (VOLTAREN ) 1 % GEL Apply 4 g topically 4 (four) times daily. (Patient  not taking: Reported on 08/25/2024) 100 g 0   gabapentin  (NEURONTIN ) 300 MG capsule Take 1 capsule (300 mg total) by mouth at bedtime. 90 capsule 3   glucose blood (ACCU-CHEK GUIDE TEST) test strip Use as instructed 100 each 12   hyoscyamine  (LEVSIN ) 0.125 MG tablet TAKE 1 TABLET (0.125 MG TOTAL) BY MOUTH 3 (THREE) TIMES DAILY AS NEEDED. 270 tablet 1   ibuprofen  (ADVIL ) 600 MG tablet Take 1 tablet (600 mg total) by mouth every 8 (eight) hours as needed for mild pain (pain score 1-3) or moderate pain (pain score 4-6). (Patient not taking: Reported on 08/25/2024) 15 tablet 0   insulin  glargine (LANTUS ) 100 UNIT/ML Solostar Pen Inject 12 Units into the skin daily. 15 mL 11   Insulin  Pen Needle (PEN NEEDLES) 32G X 5 MM MISC Use to  inject insulin  once daily 100 each 3   lisinopril  (ZESTRIL ) 40 MG tablet Take 1 tablet (40 mg total) by mouth daily. 90 tablet 3   magic mouthwash (nystatin , lidocaine , diphenhydrAMINE ) suspension Take 5 mLs by mouth 4 (four) times daily as needed for mouth pain. 180 mL 0   metFORMIN  (GLUCOPHAGE ) 500 MG tablet Take 2 tablets (1,000 mg total) by mouth 2 (two) times daily with a meal. 360 tablet 3   Multiple Vitamin (MULTIVITAMIN WITH MINERALS) TABS tablet Take 1 tablet by mouth daily.     nystatin  (MYCOSTATIN /NYSTOP ) powder APPLY TOPICALLY 3 TIMES A DAY 15 g 0   oxyCODONE  (ROXICODONE ) 5 MG immediate release tablet Take 1 tablet (5 mg total) by mouth every 6 (six) hours as needed for up to 10 doses. 10 tablet 0   pantoprazole  (PROTONIX ) 20 MG tablet TAKE 1 TABLET BY MOUTH EVERY DAY 90 tablet 1   Probiotic Product (PROBIOTIC PO) Take 1 capsule by mouth daily. (Patient not taking: Reported on 08/25/2024)     propranolol  (INDERAL ) 20 MG tablet Take 2 tablets (40 mg total) by mouth 2 (two) times daily. 360 tablet 1   rosuvastatin  (CRESTOR ) 20 MG tablet Take 1 tablet (20 mg total) by mouth daily. 90 tablet 3   spironolactone  (ALDACTONE ) 25 MG tablet Take 1 tablet (25 mg total) by mouth daily. 30 tablet 3   Vitamin D , Ergocalciferol , (DRISDOL ) 1.25 MG (50000 UNIT) CAPS capsule Take 1 capsule (50,000 Units total) by mouth every 7 (seven) days. 8 capsule 0   No current facility-administered medications for this visit.    Allergies:   Amoxicillin, Cashew nut oil, Anacardium occidentale, Dilaudid  [hydromorphone  hcl], Lidocaine , Egg protein-containing drug products, and Pineapple   Social History:  see above  Family History:  see above  PHYSICAL EXAM: VS:  LMP 07/27/2024 (Approximate)  , BMI There is no height or weight on file to calculate BMI. GEN: Well nourished, well developed, in no acute distress HEENT: normal Neck: no JVD, carotid bruits, or masses Cardiac: ***RRR; no murmurs, rubs, or  gallops,no edema  Respiratory:  CTAB bilaterally, normal work of breathing GI: soft, nontender, nondistended, + BS Extremities: No LE edema Skin: warm and dry, no rash Neuro:  Strength and sensation are intact  EKG: ***  Recent Labs: Reviewed  Studies: Reviewed  ASSESSMENT AND PLAN: Maria Carpenter is a 44 y.o. female who presents for new visit.    Signed, Joelle VEAR Ren Donley, MD  09/04/2024 10:57 AM    [facility] "

## 2024-09-07 ENCOUNTER — Ambulatory Visit

## 2024-09-07 ENCOUNTER — Encounter: Payer: Self-pay | Admitting: Neurology

## 2024-09-07 ENCOUNTER — Ambulatory Visit: Admitting: Neurology

## 2024-09-07 DIAGNOSIS — I1A0 Resistant hypertension: Secondary | ICD-10-CM

## 2024-09-07 DIAGNOSIS — Z794 Long term (current) use of insulin: Secondary | ICD-10-CM

## 2024-09-07 DIAGNOSIS — G4733 Obstructive sleep apnea (adult) (pediatric): Secondary | ICD-10-CM

## 2024-09-07 NOTE — Progress Notes (Unsigned)
 "     Cardiology Office Note Date:  09/10/2024  ID:  Maria Carpenter, DOB 08/25/1981, MRN [id number] PCP:  Tobie Gaines, DO  Cardiologist:  Joelle VEAR Ren Donley, MD  No chief complaint on file.    Problems Resistant HTN TTE 9/20: 60-65%, mod LVH Renin aldo 2024 N Pending sleep study No evidence of renal stenosis 12/25 IDDM CVA Tobacco use 8 pack year M: AE10, CE50, Insulin , LL40, PL40BID, RN20, SE25  Visits  1/26: HTN 50, BP cuff, BP log, Labs (BMP, Mag, renin aldo, Lpa), hydral 25 as needed for SBP > 190, 7 day zio for Hx of SVT, coronary CTA/TTE   Discussed the use of AI scribe software for clinical note transcription with the patient, who gave verbal consent to proceed.  History of Present Illness Maria Carpenter is a 44 year old female with supraventricular tachycardia and hypertension who presents for cardiology evaluation. She was referred by her rheumatologist for evaluation due to concerns about her SVT and high blood pressure. She has a history of supraventricular tachycardia (SVT) since birth, managed with propranolol  taken twice daily. Despite this, she experiences daily episodes of SVT, particularly if she is late taking her medication. These episodes are described as 'really annoying' and occur every night when she goes to bed, especially if she lies down before her scheduled medication time. She has a history of high blood pressure diagnosed at age 35, for which she has been on medication since then. She has experienced preeclampsia with each pregnancy. Currently, she is on lisinopril , but has stopped spironolactone  due to side effects such as hair loss and headaches. She also discontinued chlorthalidone  last summer as it was not effective in managing her symptoms compared to HCTZ, which she had been on previously. Her blood pressure remains high, with readings described as 'dangerous and high', even when lying down. She is unable to undergo dental procedures due to  elevated blood pressure and has required emergency care for high blood pressure and tooth pain. She has a history of strokes in 2012 but denies any other known heart problems. She experiences random chest pains that occur without warning and are sometimes accompanied by a heavy, tingly sensation in her arms. These episodes occur several times a week and last for about five to six minutes. She has a 17-year history of smoking, currently smoking half a pack per day. She has a family history of heart disease; her father had heart attacks in his 32s and 34s and has had stents placed. She has lupus, which is being managed by a rheumatologist. A recent CT scan for renal evaluation showed no stenosis, although she was previously diagnosed with it.   ROS: Please see the history of present illness. All other systems are reviewed and negative.   Past Medical History:  Diagnosis Date   Anemia    Asthma    Chlamydia 2012   treated   Complication of anesthesia     they have trouble waking me up   Diabetes mellitus    A2DM during pregnancy. now on metformin .    Dyspnea 05/07/2019   Dysuria 04/11/2019   Family history of anesthesia complication    mother & daughter    GERD (gastroesophageal reflux disease)    Gonorrhea 2010   treated   Hypertension    Lupus 10-2013   Morbid obesity (HCC) 07/22/2011   Pregnancy induced hypertension    pre E with pregnancies   Rheumatoid arthritis(714.0)    Shortness  of breath    Sleep apnea    does not use cpap   Stroke [facility]) 2012   March 2012>right side deficit (speech, upper/ower weakness)   SVT (supraventricular tachycardia)     Past Surgical History:  Procedure Laterality Date   CHOLECYSTECTOMY  07/22/2011   Procedure: LAPAROSCOPIC CHOLECYSTECTOMY WITH INTRAOPERATIVE CHOLANGIOGRAM;  Surgeon: Alm VEAR Angle, MD;  Location: [facility];  Service: General;  Laterality: N/A;   ERCP  07/24/2011   Procedure: ENDOSCOPIC RETROGRADE CHOLANGIOPANCREATOGRAPHY (ERCP);   Surgeon: Belvie JONETTA Just;  Location: [facility];  Service: Endoscopy;  Laterality: N/A;   FINGER SURGERY  1993   left ring finger to correct bone problem   TUBAL LIGATION  2009    Current Outpatient Medications  Medication Sig Dispense Refill   Accu-Chek Softclix Lancets lancets Check blood sugar 3 times a day 100 each 11   albuterol  (VENTOLIN  HFA) 108 (90 Base) MCG/ACT inhaler Inhale 1 puff into the lungs every 4 (four) hours as needed for wheezing or shortness of breath. 18 each 3   amLODipine  (NORVASC ) 10 MG tablet TAKE 1 TABLET BY MOUTH EVERY DAY 90 tablet 1   Blood Glucose Monitoring Suppl (ACCU-CHEK GUIDE) w/Device KIT 1 each by Does not apply route 4 (four) times daily. Check blood sugar 3 times a day 1 kit 1   chlorhexidine  (PERIDEX ) 0.12 % solution Use as directed 15 mLs in the mouth or throat 2 (two) times daily. 120 mL 0   chlorthalidone  (HYGROTON ) 50 MG tablet Take 1 tablet (50 mg total) by mouth daily. 30 tablet 11   Cholecalciferol  25 MCG (1000 UT) capsule Take 1 capsule (1,000 Units total) by mouth daily. 60 capsule 2   Continuous Glucose Receiver (DEXCOM G6 RECEIVER) DEVI Use to check blood sugar at least 6 times a day 1 each 0   Continuous Glucose Sensor (DEXCOM G6 SENSOR) MISC Check blood sugar at least 6 times a day 3 each 11   Continuous Glucose Transmitter (DEXCOM G6 TRANSMITTER) MISC Use to check blood sugar at least 6 times a day 1 each 3   gabapentin  (NEURONTIN ) 300 MG capsule Take 1 capsule (300 mg total) by mouth at bedtime. 90 capsule 3   glucose blood (ACCU-CHEK GUIDE TEST) test strip Use as instructed 100 each 12   hyoscyamine  (LEVSIN ) 0.125 MG tablet TAKE 1 TABLET (0.125 MG TOTAL) BY MOUTH 3 (THREE) TIMES DAILY AS NEEDED. 270 tablet 1   insulin  glargine (LANTUS ) 100 UNIT/ML Solostar Pen Inject 12 Units into the skin daily. 15 mL 11   Insulin  Pen Needle (PEN NEEDLES) 32G X 5 MM MISC Use to inject insulin  once daily 100 each 3   lisinopril  (ZESTRIL ) 40 MG tablet  Take 1 tablet (40 mg total) by mouth daily. 90 tablet 3   magic mouthwash (nystatin , lidocaine , diphenhydrAMINE ) suspension Take 5 mLs by mouth 4 (four) times daily as needed for mouth pain. 180 mL 0   metFORMIN  (GLUCOPHAGE ) 500 MG tablet Take 2 tablets (1,000 mg total) by mouth 2 (two) times daily with a meal. 360 tablet 3   Multiple Vitamin (MULTIVITAMIN WITH MINERALS) TABS tablet Take 1 tablet by mouth daily.     nystatin  (MYCOSTATIN /NYSTOP ) powder APPLY TOPICALLY 3 TIMES A DAY 15 g 0   oxyCODONE  (ROXICODONE ) 5 MG immediate release tablet Take 1 tablet (5 mg total) by mouth every 6 (six) hours as needed for up to 10 doses. 10 tablet 0   pantoprazole  (PROTONIX ) 20 MG tablet TAKE 1 TABLET BY  MOUTH EVERY DAY 90 tablet 1   propranolol  (INDERAL ) 20 MG tablet Take 2 tablets (40 mg total) by mouth 2 (two) times daily. 360 tablet 1   rosuvastatin  (CRESTOR ) 20 MG tablet Take 1 tablet (20 mg total) by mouth daily. 90 tablet 3   spironolactone  (ALDACTONE ) 25 MG tablet Take 1 tablet (25 mg total) by mouth daily. 30 tablet 3   Vitamin D , Ergocalciferol , (DRISDOL ) 1.25 MG (50000 UNIT) CAPS capsule Take 1 capsule (50,000 Units total) by mouth every 7 (seven) days. 8 capsule 0   No current facility-administered medications for this visit.    Allergies:   Amoxicillin, Cashew nut oil, Anacardium occidentale, Dilaudid  [hydromorphone  hcl], Lidocaine , Egg protein-containing drug products, and Pineapple   Social History:  see above  Family History:  see above  PHYSICAL EXAM: VS:  BP (!) 188/110   Pulse 77   Ht 5' 7 (1.702 m)   Wt (!) 363 lb (164.7 kg)   LMP 08/31/2024 (Exact Date)   SpO2 97%   BMI 56.85 kg/m  , BMI Body mass index is 56.85 kg/m. GEN: Well nourished, well developed, in no acute distress HEENT: normal Neck: no JVD, carotid bruits, or masses Cardiac: RRR; no murmurs, rubs, or gallops,no edema  Respiratory:  CTAB bilaterally, normal work of breathing GI: soft, nontender, nondistended,  + BS Extremities: No LE edema Skin: warm and dry, no rash Neuro:  Strength and sensation are intact  EKG: NSR  Recent Labs: Reviewed  Studies: Reviewed Assessment & Plan  Resistant hypertension Hypertension uncontrolled with current medications. No renal artery stenosis. Considering secondary causes, including lupus. High stroke risk due to elevated blood pressure. - Restarted HCTZ at 50 mg daily. - Ordered blood work in two weeks to check potassium, creatinine, and renin-aldosterone levels. - Provided a new blood pressure cuff and instructed on blood pressure logging. - Prescribed hydralazine  for use if blood pressure exceeds 190 mmHg, with instructions to contact healthcare provider if used frequently. - Scheduled follow-up in one month to review blood pressure log and adjust treatment as needed.  Supraventricular tachycardia SVT managed with propranolol , experiencing daily episodes. Differential includes anxiety. Discussed ablation as a potential treatment if frequent episodes are confirmed. - Ordered a 7-day heart monitor to assess heart rate and rhythm. - Discussed potential for ablation if monitor indicates frequent SVT episodes.  CP Likely non-cardiac but given significant risk factors, will pursue further work up - Lpa given family Hx of early ASCVD - TTE - Coronary CTA   Signed, Joelle VEAR Ren Donley, MD  09/10/2024 1:51 PM    [facility] "

## 2024-09-09 NOTE — Progress Notes (Unsigned)
 "  Office Visit Note  Patient: Maria Carpenter             Date of Birth: [birth year]           MRN: [id number]             PCP: Tobie Gaines, DO Referring: Tobie Gaines, DO Visit Date: 09/22/2024 Occupation: home maker  Subjective:  No chief complaint on file.   History of Present Illness: NYLIA GAVINA is a 44 y.o. female with polyarthralgia and a history of of prior diagnosis of juvenile RA and SLE. She is presenting for a new patient follow up. She was last seen 08/25/2024 where she had lab work and multiple referrals placed.     Activities of Daily Living:  Patient reports morning stiffness for *** {minute/hour:19697}.   Patient {ACTIONS;DENIES/REPORTS:21021675::Denies} nocturnal pain.  Difficulty dressing/grooming: {ACTIONS;DENIES/REPORTS:21021675::Denies} Difficulty climbing stairs: {ACTIONS;DENIES/REPORTS:21021675::Denies} Difficulty getting out of chair: {ACTIONS;DENIES/REPORTS:21021675::Denies} Difficulty using hands for taps, buttons, cutlery, and/or writing: {ACTIONS;DENIES/REPORTS:21021675::Denies}  No Rheumatology ROS completed.   PMFS History:  Patient Active Problem List   Diagnosis Date Noted   Abdominal pain 06/01/2022   Depression 06/29/2020   Left renal artery stenosis 06/29/2020   Vitamin D  deficiency 06/29/2020   Low back pain 06/10/2019   Hidradenitis suppurativa 04/23/2019   Fatigue 04/11/2019   Asthma    Breast lump    Abnormal uterine bleeding (AUB) 07/25/2017   Systemic lupus erythematosus (HCC) 10/25/2013   TIA (transient ischemic attack) 07/02/2012   Diabetes (HCC) 07/02/2012   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   GERD (gastroesophageal reflux disease) 07/02/2012   Obstructive sleep apnea 07/02/2012   CTS (carpal tunnel syndrome) 07/02/2012   Morbid obesity (HCC) 07/22/2011   History of CVA (cerebrovascular accident) 2012    Past Medical History:  Diagnosis Date   Anemia    Asthma    Chlamydia 2012   treated    Complication of anesthesia     they have trouble waking me up   Diabetes mellitus    A2DM during pregnancy. now on metformin .    Dyspnea 05/07/2019   Dysuria 04/11/2019   Family history of anesthesia complication    mother & daughter    GERD (gastroesophageal reflux disease)    Gonorrhea 2010   treated   Hypertension    Lupus 10-2013   Morbid obesity (HCC) 07/22/2011   Pregnancy induced hypertension    pre E with pregnancies   Rheumatoid arthritis(714.0)    Shortness of breath    Sleep apnea    does not use cpap   Stroke [facility]) 2012   March 2012>right side deficit (speech, upper/ower weakness)   SVT (supraventricular tachycardia)     Family History  Problem Relation Age of Onset   Cancer Mother    Anesthesia problems Mother        difficult to arouse, SOB   Diabetes Mother    Hypertension Mother    Stroke Mother    Cancer Father    Diabetes Father    Hypertension Father    Heart disease Father    Hypertension Sister    Diabetes Sister    Diabetes Brother    Hypertension Brother    Cancer Maternal Grandmother    Breast cancer Maternal Grandmother    Colon cancer Maternal Grandmother    Cancer Maternal Grandfather    Stroke Paternal Grandmother    Cancer Maternal Aunt    Breast cancer Cousin    Asthma Son    Autism spectrum disorder Son  Eczema Son    Eczema Son    Asthma Son    Autism spectrum disorder Son    Supraventricular tachycardia Son    Eczema Daughter    Asthma Daughter    Hypertension Daughter    Autism spectrum disorder Daughter    Epilepsy Daughter    Eczema Daughter    Asthma Daughter    Hypertension Daughter    Autism spectrum disorder Daughter    Eczema Daughter    Asthma Daughter    Diabetes Daughter    Hypertension Daughter    Autism spectrum disorder Daughter    Eczema Daughter    Asthma Daughter    Hypertension Daughter    Autism spectrum disorder Daughter    Past Surgical History:  Procedure Laterality Date    CHOLECYSTECTOMY  07/22/2011   Procedure: LAPAROSCOPIC CHOLECYSTECTOMY WITH INTRAOPERATIVE CHOLANGIOGRAM;  Surgeon: Alm VEAR Angle, MD;  Location: [facility];  Service: General;  Laterality: N/A;   ERCP  07/24/2011   Procedure: ENDOSCOPIC RETROGRADE CHOLANGIOPANCREATOGRAPHY (ERCP);  Surgeon: Belvie JONETTA Just;  Location: [facility];  Service: Endoscopy;  Laterality: N/A;   FINGER SURGERY  1993   left ring finger to correct bone problem   TUBAL LIGATION  2009   Social History[1] Social History   Social History Narrative   Not on file     Immunization History  Administered Date(s) Administered   Influenza Split 07/24/2011   Influenza, Seasonal, Injecte, Preservative Fre 05/14/2024   Influenza,inj,Quad PF,6+ Mos 05/07/2019   Pneumococcal Polysaccharide-23 07/24/2011     Objective: Vital Signs: LMP 07/27/2024 (Approximate)    Physical Exam   Musculoskeletal Exam: ***  CDAI Exam: CDAI Score: -- Patient Global: --; Provider Global: -- Swollen: --; Tender: -- Joint Exam 09/22/2024   No joint exam has been documented for this visit   There is currently no information documented on the homunculus. Go to the Rheumatology activity and complete the homunculus joint exam.  Investigation: No additional findings.  Imaging: No results found.  Recent Labs: Lab Results  Component Value Date   WBC 8.3 04/12/2024   HGB 11.6 (L) 04/12/2024   PLT 264 04/12/2024   NA 135 04/12/2024   K 3.9 04/12/2024   CL 99 04/12/2024   CO2 25 04/12/2024   GLUCOSE 185 (H) 04/12/2024   BUN 13 04/12/2024   CREATININE 0.80 07/28/2024   BILITOT 0.2 04/12/2024   ALKPHOS 96 04/12/2024   AST 15 04/12/2024   ALT 9 04/12/2024   PROT 7.3 04/12/2024   ALBUMIN 4.0 04/12/2024   CALCIUM  9.2 04/12/2024   GFRAA 128 03/26/2019    Speciality Comments: No specialty comments available.  Procedures:  No procedures performed Allergies: Amoxicillin, Cashew nut oil, Anacardium occidentale, Dilaudid   [hydromorphone  hcl], Lidocaine , Egg protein-containing drug products, and Pineapple   Assessment / Plan:     Visit Diagnoses: No diagnosis found.  Orders: No orders of the defined types were placed in this encounter.  No orders of the defined types were placed in this encounter.   Face-to-face time spent with patient was *** minutes. Greater than 50% of time was spent in counseling and coordination of care.  Follow-Up Instructions: No follow-ups on file.   Alfonso Patterson, LPN  Note - This record has been created using [employer].  Chart creation errors have been sought, but may not always  have been located. Such creation errors do not reflect on  the standard of medical care.    [1]  Social History Tobacco Use  Smoking status: Every Day    Current packs/day: 0.25    Average packs/day: 0.3 packs/day for 4.0 years (1.0 ttl pk-yrs)    Types: Cigarettes    Passive exposure: Current   Smokeless tobacco: Never   Tobacco comments:    15 cig/day  Vaping Use   Vaping status: Never Used  Substance Use Topics   Alcohol use: Yes    Comment: socially   Drug use: Not Currently    Types: Marijuana    Comment: last month   "

## 2024-09-10 ENCOUNTER — Ambulatory Visit

## 2024-09-10 VITALS — BP 188/110 | HR 77 | Ht 67.0 in | Wt 363.0 lb

## 2024-09-10 DIAGNOSIS — I10 Essential (primary) hypertension: Secondary | ICD-10-CM | POA: Diagnosis not present

## 2024-09-10 DIAGNOSIS — E1169 Type 2 diabetes mellitus with other specified complication: Secondary | ICD-10-CM | POA: Diagnosis not present

## 2024-09-10 DIAGNOSIS — E785 Hyperlipidemia, unspecified: Secondary | ICD-10-CM | POA: Diagnosis not present

## 2024-09-10 DIAGNOSIS — I471 Supraventricular tachycardia, unspecified: Secondary | ICD-10-CM

## 2024-09-10 DIAGNOSIS — I1A0 Resistant hypertension: Secondary | ICD-10-CM | POA: Diagnosis present

## 2024-09-10 DIAGNOSIS — R079 Chest pain, unspecified: Secondary | ICD-10-CM | POA: Diagnosis not present

## 2024-09-10 MED ORDER — HYDROCHLOROTHIAZIDE 50 MG PO TABS: 50.0000 mg | ORAL_TABLET | Freq: Every day | ORAL | 3 refills | Status: AC

## 2024-09-10 MED ORDER — HYDRALAZINE HCL 25 MG PO TABS: 25.0000 mg | ORAL_TABLET | Freq: Every day | ORAL | 3 refills | Status: AC | PRN

## 2024-09-10 MED ORDER — OMRON 3 SERIES BP MONITOR DEVI: 0 refills | Status: DC

## 2024-09-10 MED ORDER — OMRON 3 SERIES BP MONITOR DEVI: 0 refills | Status: AC

## 2024-09-10 MED ORDER — METOPROLOL TARTRATE 100 MG PO TABS: 100.0000 mg | ORAL_TABLET | Freq: Once | ORAL | 0 refills | Status: AC

## 2024-09-10 NOTE — Patient Instructions (Signed)
 Medication Instructions:  Start Hydrochlorothiazide  50 mg take one tablet daily  Start Hydralazine  25 mg take one tablet daily as needed for systolic (top number) higher than 190 *If you need a refill on your cardiac medications before your next appointment, please call your pharmacy*  Lab Work: 2 weeks- BMP, Mg2, Renal Adolse,LPa If you have labs (blood work) drawn today and your tests are completely normal, you will receive your results only by: MyChart Message (if you have MyChart) OR A paper copy in the mail If you have any lab test that is abnormal or we need to change your treatment, we will call you to review the results.  Testing/Procedures: Your physician has requested that you have an echocardiogram. Echocardiography is a painless test that uses sound waves to create images of your heart. It provides your doctor with information about the size and shape of your heart and how well your hearts chambers and valves are working. This procedure takes approximately one hour. There are no restrictions for this procedure. Please do NOT wear cologne, perfume, aftershave, or lotions (deodorant is allowed). Please arrive 15 minutes prior to your appointment time.  Please note: We ask at that you not bring children with you during ultrasound (echo/ vascular) testing. Due to room size and safety concerns, children are not allowed in the ultrasound rooms during exams. Our front office staff cannot provide observation of children in our lobby area while testing is being conducted. An adult accompanying a patient to their appointment will only be allowed in the ultrasound room at the discretion of the ultrasound technician under special circumstances. We apologize for any inconvenience.        Your cardiac CT will be scheduled at one of the below locations:    [facility] [address]  [place], [place] [address]  If scheduled at the [facility] at  [address], please enter the parking lot using the [address] entrance and use the FREE valet service at the patient drop-off area. Enter the building and check-in with registration on the main floor.  Please follow these instructions carefully (unless otherwise directed):  An IV will be required for this test and Nitroglycerin will be given.  Hold all erectile dysfunction medications at least 3 days (72 hrs) prior to test. (Ie viagra, cialis, sildenafil, tadalafil, etc)   On the Night Before the Test: Be sure to Drink plenty of water. Do not consume any caffeinated/decaffeinated beverages or chocolate 12 hours prior to your test. Do not take any antihistamines 12 hours prior to your test.  If the patient has contrast allergy: Patient will need a prescription for Prednisone  and very clear instructions (as follows): Prednisone  50 mg - take 13 hours prior to test Take another Prednisone  50 mg 7 hours prior to test Take another Prednisone  50 mg 1 hour prior to test Take Benadryl  50 mg 1 hour prior to test Patient must complete all four doses of above prophylactic medications. Patient will need a ride after test due to Benadryl .  On the Day of the Test: Drink plenty of water until 1 hour prior to the test. Do not eat any food 1 hour prior to test. You may take your regular medications prior to the test.  Take metoprolol  (Lopressor ) two hours prior to test. If you take Furosemide/Hydrochlorothiazide /Spironolactone /Chlorthalidone , please HOLD on the morning of the test. Patients who wear a continuous glucose monitor MUST remove the device prior to scanning. FEMALES- please  wear underwire-free bra if available, avoid dresses & tight clothing        After the Test: Drink plenty of water. After receiving IV contrast, you may experience a mild flushed feeling. This is normal. On occasion, you may experience a mild rash up to 24 hours after the test. This is not dangerous. If this  occurs, you can take Benadryl  25 mg, Zyrtec, Claritin , or Allegra and increase your fluid intake. (Patients taking Tikosyn should avoid Benadryl , and may take Zyrtec, Claritin , or Allegra) If you experience trouble breathing, this can be serious. If it is severe call 911 IMMEDIATELY. If it is mild, please call our office.  We will call to schedule your test 2-4 weeks out understanding that some insurance companies will need an authorization prior to the service being performed.   For more information and frequently asked questions, please visit our website : [email or web address]  For non-scheduling related questions, please contact the cardiac imaging nurse navigator should you have any questions/concerns: Cardiac Imaging Nurse Navigators Direct Office Dial: [phone]   For scheduling needs, including cancellations and rescheduling, please call Brittany, [phone].  ZIO XT- Long Term Monitor Instructions  Your physician has requested you wear a ZIO patch monitor for 7 days.  This is a single patch monitor. [employer] supplies one patch monitor per enrollment. Additional stickers are not available. Please do not apply patch if you will be having a Nuclear Stress Test,  Echocardiogram, Cardiac CT, MRI, or Chest Xray during the period you would be wearing the  monitor. The patch cannot be worn during these tests. You cannot remove and re-apply the  ZIO XT patch monitor.  Your ZIO patch monitor will be mailed 3 day USPS to your address on file. It may take 3-5 days  to receive your monitor after you have been enrolled.  Once you have received your monitor, please review the enclosed instructions. Your monitor  has already been registered assigning a specific monitor serial # to you.  Billing and Patient Assistance Program Information  We have supplied [employer] with any of your insurance information on file for billing purposes. [employer] offers a sliding scale Patient Assistance  Program for patients that do not have  insurance, or whose insurance does not completely cover the cost of the ZIO monitor.  You must apply for the Patient Assistance Program to qualify for this discounted rate.  To apply, please call [employer] at [phone], select option 4, select option 2, ask to apply for  Patient Assistance Program. Meredeth will ask your household income, and how many people  are in your household. They will quote your out-of-pocket cost based on that information.  [employer] will also be able to set up a 62-month, interest-free payment plan if needed.    When you are ready to remove the patch, follow instructions on the last 2 pages of Patient  Logbook. Stick patch monitor onto the last page of Patient Logbook.  Place Patient Logbook in the blue and white box. Use locking tab on box and tape box closed  securely. The blue and white box has prepaid postage on it. Please place it in the mailbox as  soon as possible. Your physician should have your test results approximately 7 days after the  monitor has been mailed back to [facility].  Call [facility] Customer Care at [phone] if you have questions regarding  your ZIO XT patch monitor. Call them immediately if you see an orange light blinking on your  monitor.  If your monitor falls off in less than 4 days, contact our Monitor department at [phone].  If your monitor becomes loose or falls off after 4 days call [employer] at [phone] for  suggestions on securing your monitor   Follow-Up: At [facility], you and your health needs are our priority.  As part of our continuing mission to provide you with exceptional heart care, our providers are all part of one team.  This team includes your primary Cardiologist (physician) and Advanced Practice Providers or APPs (Physician Assistants and Nurse Practitioners) who all work together to provide you with the care you need, when you need it.  Your next  appointment:   1 month(s)  Provider:   Joelle VEAR Ren Donley, MD    We recommend signing up for the patient portal called MyChart.  Sign up information is provided on this After Visit Summary.  MyChart is used to connect with patients for Virtual Visits (Telemedicine).  Patients are able to view lab/test results, encounter notes, upcoming appointments, etc.  Non-urgent messages can be sent to your provider as well.   To learn more about what you can do with MyChart, go to [email or web address].   Other Instructions Monitor your blood pressure and bring your readings to your next appointment  We need to get a better idea of what your blood pressure is running at home. Here are some instructions to follow: - I would recommend using a blood pressure cuff that goes on your arm. The wrist ones can be inaccurate. If you're purchasing one for the first time, try to select one that also reports your heart rate because this can be helpful information as well. - To check your blood pressure, choose a time at least 3 hours after taking your blood pressure medicines. If you can sample it at different times of the day, that's great - it might give you more information about how your blood pressure fluctuates. Remain seated in a chair for 5 minutes quietly beforehand, then check it.

## 2024-09-10 NOTE — Progress Notes (Unsigned)
 Applied a 7 day Zio XT monitor to patient in the office

## 2024-09-17 ENCOUNTER — Ambulatory Visit: Payer: Self-pay

## 2024-09-22 ENCOUNTER — Ambulatory Visit

## 2024-09-22 DIAGNOSIS — L732 Hidradenitis suppurativa: Secondary | ICD-10-CM

## 2024-09-22 DIAGNOSIS — G8929 Other chronic pain: Secondary | ICD-10-CM

## 2024-09-22 DIAGNOSIS — I639 Cerebral infarction, unspecified: Secondary | ICD-10-CM

## 2024-09-22 DIAGNOSIS — M2550 Pain in unspecified joint: Secondary | ICD-10-CM

## 2024-09-22 DIAGNOSIS — I824Z9 Acute embolism and thrombosis of unspecified deep veins of unspecified distal lower extremity: Secondary | ICD-10-CM

## 2024-09-22 DIAGNOSIS — Z8759 Personal history of other complications of pregnancy, childbirth and the puerperium: Secondary | ICD-10-CM

## 2024-09-22 DIAGNOSIS — M791 Myalgia, unspecified site: Secondary | ICD-10-CM

## 2024-09-22 DIAGNOSIS — I1A0 Resistant hypertension: Secondary | ICD-10-CM

## 2024-09-28 ENCOUNTER — Inpatient Hospital Stay: Admitting: Hematology and Oncology

## 2024-09-28 ENCOUNTER — Inpatient Hospital Stay

## 2024-10-01 NOTE — Progress Notes (Unsigned)
" °   °  °  Cardiology Office Note Date:  10/01/2024  ID:  Maria Carpenter, DOB 05/17/1981, MRN [id number] PCP:  Tobie Gaines, DO  Cardiologist:  Joelle VEAR Ren Donley, MD  No chief complaint on file.     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  History of Present Illness     Physical Exam VS:  There were no vitals taken for this visit. , BMI There is no height or weight on file to calculate BMI. GEN: Well nourished, well developed, in no acute distress HEENT: normal Neck: no JVD, carotid bruits, or masses Cardiac: ***RRR; no murmurs, rubs, or gallops,no edema  Respiratory:  CTAB bilaterally, normal work of breathing GI: soft, nontender, nondistended, + BS Extremities: No LE edema Skin: warm and dry, no rash Neuro:  Strength and sensation are intact  Recent Labs: Reviewed Assessment & Plan      Signed, Joelle VEAR Ren Donley, MD  10/01/2024 4:08 PM    [facility] "

## 2024-10-14 ENCOUNTER — Ambulatory Visit (HOSPITAL_COMMUNITY)

## 2024-10-15 ENCOUNTER — Ambulatory Visit

## 2024-10-15 DIAGNOSIS — I1A0 Resistant hypertension: Secondary | ICD-10-CM

## 2024-10-15 DIAGNOSIS — I471 Supraventricular tachycardia, unspecified: Secondary | ICD-10-CM

## 2024-10-15 DIAGNOSIS — R079 Chest pain, unspecified: Secondary | ICD-10-CM

## 2024-10-15 DIAGNOSIS — E785 Hyperlipidemia, unspecified: Secondary | ICD-10-CM

## 2024-10-15 DIAGNOSIS — E1169 Type 2 diabetes mellitus with other specified complication: Secondary | ICD-10-CM

## 2024-10-22 ENCOUNTER — Inpatient Hospital Stay

## 2024-10-22 ENCOUNTER — Inpatient Hospital Stay: Admitting: Nurse Practitioner

## 2024-11-02 ENCOUNTER — Ambulatory Visit: Payer: Self-pay | Admitting: Student

## 2024-11-05 ENCOUNTER — Encounter: Payer: Self-pay | Admitting: Obstetrics and Gynecology

## 2025-04-28 ENCOUNTER — Ambulatory Visit: Admitting: Physician Assistant
